# Patient Record
Sex: Female | Born: 1960 | Race: White | Hispanic: No | Marital: Married | Smoking: Current every day smoker
Health system: Southern US, Community
[De-identification: ages and names within clinical notes are randomized; demographics above are authoritative.]

## PROBLEM LIST (undated history)

## (undated) DIAGNOSIS — I10 Essential (primary) hypertension: Secondary | ICD-10-CM

## (undated) DIAGNOSIS — E669 Obesity, unspecified: Secondary | ICD-10-CM

## (undated) DIAGNOSIS — C801 Malignant (primary) neoplasm, unspecified: Secondary | ICD-10-CM

## (undated) DIAGNOSIS — Z720 Tobacco use: Secondary | ICD-10-CM

## (undated) DIAGNOSIS — R05 Cough: Secondary | ICD-10-CM

## (undated) DIAGNOSIS — K5090 Crohn's disease, unspecified, without complications: Secondary | ICD-10-CM

## (undated) DIAGNOSIS — K219 Gastro-esophageal reflux disease without esophagitis: Secondary | ICD-10-CM

## (undated) DIAGNOSIS — J40 Bronchitis, not specified as acute or chronic: Secondary | ICD-10-CM

## (undated) DIAGNOSIS — M1990 Unspecified osteoarthritis, unspecified site: Secondary | ICD-10-CM

## (undated) DIAGNOSIS — A0472 Enterocolitis due to Clostridium difficile, not specified as recurrent: Secondary | ICD-10-CM

## (undated) DIAGNOSIS — K5792 Diverticulitis of intestine, part unspecified, without perforation or abscess without bleeding: Secondary | ICD-10-CM

## (undated) DIAGNOSIS — R740 Nonspecific elevation of levels of transaminase and lactic acid dehydrogenase [LDH]: Secondary | ICD-10-CM

## (undated) DIAGNOSIS — J189 Pneumonia, unspecified organism: Secondary | ICD-10-CM

## (undated) DIAGNOSIS — R7401 Elevation of levels of liver transaminase levels: Secondary | ICD-10-CM

## (undated) HISTORY — DX: Essential (primary) hypertension: I10

## (undated) HISTORY — DX: Crohn's disease, unspecified, without complications: K50.90

## (undated) HISTORY — DX: Nonspecific elevation of levels of transaminase and lactic acid dehydrogenase (ldh): R74.0

## (undated) HISTORY — DX: Cough: R05

## (undated) HISTORY — PX: COLON SURGERY: SHX602

## (undated) HISTORY — PX: APPENDECTOMY: SHX54

## (undated) HISTORY — DX: Elevation of levels of liver transaminase levels: R74.01

## (undated) HISTORY — PX: COLONOSCOPY: SHX174

## (undated) HISTORY — DX: Enterocolitis due to Clostridium difficile, not specified as recurrent: A04.72

---

## 1978-12-13 HISTORY — PX: ABDOMINAL HYSTERECTOMY: SHX81

## 2002-07-24 ENCOUNTER — Ambulatory Visit (HOSPITAL_BASED_OUTPATIENT_CLINIC_OR_DEPARTMENT_OTHER): Admission: RE | Admit: 2002-07-24 | Discharge: 2002-07-24 | Payer: Self-pay | Admitting: Orthopedic Surgery

## 2003-12-14 HISTORY — PX: OTHER SURGICAL HISTORY: SHX169

## 2004-01-15 ENCOUNTER — Inpatient Hospital Stay (HOSPITAL_COMMUNITY): Admission: EM | Admit: 2004-01-15 | Discharge: 2004-01-17 | Payer: Self-pay | Admitting: Emergency Medicine

## 2004-01-23 ENCOUNTER — Encounter (HOSPITAL_COMMUNITY): Admission: RE | Admit: 2004-01-23 | Discharge: 2004-02-22 | Payer: Self-pay | Admitting: Internal Medicine

## 2004-03-06 ENCOUNTER — Encounter (HOSPITAL_COMMUNITY): Admission: RE | Admit: 2004-03-06 | Discharge: 2004-04-05 | Payer: Self-pay | Admitting: Oncology

## 2004-03-06 ENCOUNTER — Encounter: Admission: RE | Admit: 2004-03-06 | Discharge: 2004-03-06 | Payer: Self-pay | Admitting: Oncology

## 2004-05-28 ENCOUNTER — Observation Stay (HOSPITAL_COMMUNITY): Admission: EM | Admit: 2004-05-28 | Discharge: 2004-05-29 | Payer: Self-pay | Admitting: Emergency Medicine

## 2004-07-30 ENCOUNTER — Encounter (HOSPITAL_COMMUNITY): Admission: RE | Admit: 2004-07-30 | Discharge: 2004-08-29 | Payer: Self-pay | Admitting: Oncology

## 2004-09-18 ENCOUNTER — Ambulatory Visit (HOSPITAL_COMMUNITY): Admission: RE | Admit: 2004-09-18 | Discharge: 2004-09-18 | Payer: Self-pay | Admitting: Internal Medicine

## 2004-10-15 ENCOUNTER — Ambulatory Visit (HOSPITAL_COMMUNITY): Admission: RE | Admit: 2004-10-15 | Discharge: 2004-10-15 | Payer: Self-pay | Admitting: Internal Medicine

## 2004-10-22 ENCOUNTER — Ambulatory Visit (HOSPITAL_COMMUNITY): Admission: RE | Admit: 2004-10-22 | Discharge: 2004-10-22 | Payer: Self-pay | Admitting: Internal Medicine

## 2005-01-13 ENCOUNTER — Ambulatory Visit: Payer: Self-pay | Admitting: Internal Medicine

## 2005-04-27 ENCOUNTER — Ambulatory Visit: Payer: Self-pay | Admitting: Internal Medicine

## 2005-12-20 ENCOUNTER — Ambulatory Visit: Payer: Self-pay | Admitting: Internal Medicine

## 2006-08-10 ENCOUNTER — Ambulatory Visit: Payer: Self-pay | Admitting: Internal Medicine

## 2007-04-28 ENCOUNTER — Ambulatory Visit: Payer: Self-pay | Admitting: Internal Medicine

## 2010-02-25 ENCOUNTER — Encounter (HOSPITAL_COMMUNITY): Admission: RE | Admit: 2010-02-25 | Discharge: 2010-03-27 | Payer: Self-pay | Admitting: Orthopaedic Surgery

## 2010-06-02 ENCOUNTER — Ambulatory Visit (HOSPITAL_COMMUNITY): Admission: RE | Admit: 2010-06-02 | Discharge: 2010-06-02 | Payer: Self-pay | Admitting: Orthopaedic Surgery

## 2010-10-26 ENCOUNTER — Ambulatory Visit: Payer: Self-pay | Admitting: Internal Medicine

## 2010-11-24 ENCOUNTER — Ambulatory Visit: Payer: Self-pay | Admitting: Gynecology

## 2010-11-24 ENCOUNTER — Other Ambulatory Visit
Admission: RE | Admit: 2010-11-24 | Discharge: 2010-11-24 | Payer: Self-pay | Source: Home / Self Care | Admitting: Gynecology

## 2011-01-02 ENCOUNTER — Encounter (INDEPENDENT_AMBULATORY_CARE_PROVIDER_SITE_OTHER): Payer: Self-pay | Admitting: Internal Medicine

## 2011-02-19 ENCOUNTER — Other Ambulatory Visit (INDEPENDENT_AMBULATORY_CARE_PROVIDER_SITE_OTHER): Payer: Self-pay | Admitting: Internal Medicine

## 2011-02-19 DIAGNOSIS — K5090 Crohn's disease, unspecified, without complications: Secondary | ICD-10-CM

## 2011-02-25 ENCOUNTER — Ambulatory Visit (HOSPITAL_COMMUNITY): Payer: PRIVATE HEALTH INSURANCE

## 2011-03-02 ENCOUNTER — Ambulatory Visit (HOSPITAL_COMMUNITY)
Admission: RE | Admit: 2011-03-02 | Discharge: 2011-03-02 | Disposition: A | Discharge status: Home / Self Care | Payer: PRIVATE HEALTH INSURANCE | Source: Ambulatory Visit | Attending: Internal Medicine | Admitting: Internal Medicine

## 2011-03-02 DIAGNOSIS — R748 Abnormal levels of other serum enzymes: Secondary | ICD-10-CM | POA: Insufficient documentation

## 2011-03-02 DIAGNOSIS — K5090 Crohn's disease, unspecified, without complications: Secondary | ICD-10-CM | POA: Insufficient documentation

## 2011-03-15 ENCOUNTER — Ambulatory Visit (INDEPENDENT_AMBULATORY_CARE_PROVIDER_SITE_OTHER): Payer: PRIVATE HEALTH INSURANCE | Admitting: Internal Medicine

## 2011-03-15 DIAGNOSIS — K5090 Crohn's disease, unspecified, without complications: Secondary | ICD-10-CM

## 2011-03-29 NOTE — Consult Note (Signed)
Elizabeth [name]                  ACCOUNT NO.:  [id number]  MEDICAL RECORD NO.:  [id number]           PATIENT TYPE: AMB.  LOCATION: [place].                                 [facility]  PHYSICIAN:  Hildred Laser, M.D.    DATE OF BIRTH:  1961/12/07  DATE :  03/15/2011                                 OFFICE VISIT.   PRESENTING COMPLAINT:  Elevated transaminases.  The patient has ileal Crohn disease and chronic GERD.  SUBJECTIVE:  Elizabeth [name] is a 50 year old Caucasian female patient of Dr. Seward Speck who is here for scheduled visit.  She has had mildly elevated transaminases for the last 2 years felt to be due to fatty liver.  However, her SGOT and SGPT had increased since May 2010, when these were 35 and 103 respectively and last month 67 and 179.  He, therefore, was treated with further studies including an ultrasound and ferritin and water markers.  The patient states in the meantime, she was found to be hypertensive. She has seen Dr. Elisabeth Cara.  She was begun on therapy.  She also has changed her eating habits and she has lost about 16 pounds in the last 6 weeks and she feels lot better.  A 2 weeks ago, she had norovirus, but she had a full recovery.  She is not having any problems with abdominal pain, diarrhea, melena or rectal bleeding or constipation.  Her appetite is very good.  She always has to control herself.  She states she worries about everything including health of her mother.  CURRENT MEDICATIONS: 1. Pentasa 1 g 3 times a day. 2. Prilosec OTC 40 mg q.a.m. 3. 6-MP 100 mg daily. 4. Black cohosh one daily. 5. Lisinopril HCT question dose daily. 6. Lisinopril 20 mg p.o. nightly.  OBJECTIVE:  VITAL SIGNS:  Weight 211 pounds, she is 67 inches tall, pulse 74 per minute, blood pressure 130/80 and temp is 98.1. EYES:  Conjunctivae are pink.  Sclerae are nonicteric. MOUTH:  Oropharyngeal mucosa is normal. NECK:  No neck masses are noted. ABDOMEN:  Full.  Bowel  sounds are normal.  On palpation, soft abdomen without tenderness or masses.  Liver edge is palpable below.  Her ostium is soft and nontender.  Spleen is not palpable.  LABORATORY DATA:  From February 19, 2011, serum ferritin 576, hepatitis B surface antigen negative, hepatitis C virus antibody is negative.  Her bilirubin was 1, AP 55, SGOT 67, SGPT 179, albumin 4.4.  Her WBC 3.7, H and H 15.7 and 43.3, platelet count was 337 K.  Ultrasound March 02, 2011, shows echogenic liver consistent with fatty infiltration.  No nodularity noted.  Gallbladder without any abnormality.  Bile duct is 4-mm and spleen is 6.9-cm.  ASSESSMENT: 1. Small bowel Crohn disease.  She appears to be in remission.  She     has been on 6-MP since February 2010.  She has had mildly elevated     transaminases since May, however, they are trending upwards.  In     the past, I felt that due to fatty liver.  This  would go along with     a weight gain.  However, this abnormality could also be due to 6-     MP.  Since I am not convinced, I am reluctant to discontinue 6-MP     at the present time.  If her transaminases are elevated due to     weight gain and fatty liver, they should start to improve now that     she is losing weight. 2. Chronic gastroesophageal reflux disease.  She is doing well with     therapy.  PLAN:  The patient will continue current diet and  physical activity.  We will not make any changes to medication today.  She will have CBC and LFTs in 4 weeks' along with serum ferritin.     Hildred Laser, M.D.     NR/MEDQ  D:  03/16/2011  T:  03/16/2011  Job:  [id number]  cc:   Wiliam Ke Fax: [phone]  Electronically Signed by Hildred Laser M.D. on 03/28/2011 11:14:04 PM

## 2011-04-27 NOTE — Assessment & Plan Note (Signed)
NAMEMarland [name]  [name], [name]                   CHART#:  [id number]   DATE:  04/28/2007                       DOB:  1961/02/26   PRESENTING COMPLAINT:  Followup of her Crohn's disease and GERD.   SUBJECTIVE:  She is a 50 year old Caucasian female who is here for  scheduled visit.  She was last seen in August 2007.  She states she has  been under a lot of stress.  She states when she gets upset or nervous  she gets nausea and vomiting.  She will be graduating in July and is  very excited, as she will be able to support herself.  She is also  concerned about her weight gain.  She has gained 8 pounds since her last  visit.  She said she is just studying too much and does not have any  time for exercise.  She states she rarely has heartburn except when she  misses her Aciphex and she has symptoms the same evening.  She has 6 to  7 bowel movements every morning within a period of an hour and nothing  thereafter.  Stools are formed to semi-formed and occasionally loose.  She denies melena, rectal bleeding or abdominal pain.  She admits that  she has a very good appetite.   MEDICATIONS:  She is on Pentasa 1 g, Aciphex 20 mg q.a.m., M.V.I. q.d.  and B12 one pill daily.   OBJECTIVE:  VITAL SIGNS:  Weight 202 pounds, she is 67 inches tall,  pulse 68 per minute, blood pressure 116/76, temperature 97.9.  HEENT:  Conjunctivae and sclera not icteric.  Oropharynx mucosa is  normal.  NECK:  No masses are noted.  ABDOMEN:  Full.  Bowel sounds are normal.  Soft and nontender abdomen.  No organomegaly or masses.  EXTREMITIES:  She does not have clubbing or peripheral edema.   ASSESSMENT:  1. Small bowel Crohn's disease.  She is status post right      hemicolectomy in 2005 at [facility] for refractory disease.      She is now on maintenance with Pentasa, hoping to prevent a      relapse.  So far, so good.  2. Chronic gastroesophageal reflux disease.  She is doing well with      therapy.  3. Sporadic  nausea and vomiting which clearly appears to be stress      induced.   PLAN:  The patient advised to take calcium with vitamin D one or two  pills daily.  She is continuing Pentasa and Aciphex as before.  She is  getting these medicines through [facility].  Prescription  given for Phenergan 25 mg b.i.d. 20 without a refill.   She will return for OV in six months and she will have CBC and chemistry  panel following that visit.       Hildred Laser, M.D.  Electronically Signed     NR/MEDQ  D:  04/28/2007  T:  04/28/2007  Job:  [id number]   cc:   Stoney Bang

## 2011-04-30 NOTE — Discharge Summary (Signed)
NAME:  Elizabeth [name], MOTHERSHEAD                            ACCOUNT NO.:  [id number]   MEDICAL RECORD NO.:  [id number]                   PATIENT TYPE:  INP   LOCATION:  O996                                 FACILITY:  [facility]   PHYSICIAN:  Vanetta Mulders. Dechurch, M.D.           DATE OF BIRTH:  1961/07/16   DATE OF ADMISSION:  05/28/2004  DATE OF DISCHARGE:  05/29/2004                                 DISCHARGE SUMMARY   DIAGNOSES:  1. Exacerbation of Crohn's disease.  2. Status post hysterectomy.  3. Status post traumatic amputation of left ring finger.   DISPOSITION:  Patient discharged to home, followed by Dr. Gala Romney.   MEDICATIONS:  1. Prednisone 30 mg daily for one week, taper as per Dr. Gala Romney.  2. Remicade 10 mg.  3. Pentasa 1 g daily.  4. Aciphex 20 mg daily.  5. New Live.   HOSPITAL COURSE:  The patient is a 50 year old Caucasian female with a  history of Crohn's disease primarily effecting the ileum and cecum.  She  apparently was no longer taking prednisone.  She presented to the emergency  room with a two day history of worsening right lower quadrant pain.  She  states she had nausea, vomiting and diarrhea.  She received some IV  steroids.  She was seen in consultation by Dr. Gala Romney, her primary care  physician who resumed her prednisone.  She was taking p.o.'s.  He felt it  was reasonable for her to be discharged.  She was discharged to home after  evaluation per Dr. Gala Romney.  She was not seen by the Hospitalist Service  except for admission.  She was discharged home in stable condition and  follow up was arranged by gastroenterology.     ___________________________________________                                         Vanetta Mulders Hillery Jacks, M.D.   FED/MEDQ  D:  06/18/2004  T:  06/19/2004  Job:  [id number]

## 2011-04-30 NOTE — H&P (Signed)
NAME:  Elizabeth [name], Elizabeth [name]                            ACCOUNT NO.:  [id number]   MEDICAL RECORD NO.:  [id number]                   PATIENT TYPE:  INP   LOCATION:  L976                                 FACILITY:  [facility]   PHYSICIAN:  Hildred Laser, M.D.                 DATE OF BIRTH:  1961-09-11   DATE OF ADMISSION:  01/15/2004  DATE OF DISCHARGE:                                HISTORY & PHYSICAL   PRESENTING COMPLAINT:  Severe lower abdominal pain, nausea, and vomiting  since yesterday.  The patient has known ileal and cecal Crohn's disease.   HISTORY OF PRESENT ILLNESS:  Elizabeth [name] is a 50 year old Caucasian female patient  of Dr. Sherrie Sport with known Crohn's disease involving the TI and cecum, who  was seen in the office for her first visit on December 30, 2003.  She was  still symptomatic.  She was maintained on Pentasa and placed back on Cipro.  She was given a prednisone taper schedule.  She had been on 30 mg q.a.m.  The patient states that she was doing great.  She had dropped the dose from  30 mg to 25 mg last week and yesterday she dropped the dose of 20 mg as  planned.  She was fine until yesterday morning when she thought she was  constipated.  She was having some fullness and lower abdominal discomfort  and she decided to take four tablets of __________.  According to her  husband, she has also taken Alka-Seltzer, but not on a regular basis.  Yesterday afternoon she began to have severe crampy pain across the lower  abdomen which doubled her over and she had nausea and some emesis.  This  morning, her abdominal pain continued and seemed more intense and she was  still having nausea and vomiting.  The patient called our office and she was  advised to go to the emergency room.  The patient has not experienced any  fever.  She has had very good appetite.  She states she has gained a few  pounds since she was seen in our office.  She has not yet been weighed in  the emergency room.  She had a  bowel movement yesterday.  She has not passed  any flatus today.  She denies hematemesis, melena, or rectal bleeding.  She  has a history of microscopic rectal bleeding or dysuria/hematuria.   She was seen in the emergency room by Gypsy Balsam. Olin Hauser, M.D.  She was noted  to be quite tender in her lower abdomen.  Her white cell count was 23,700.  Acute abdominal series revealed some gas in the distal small bowel  consistent with ileus or early small bowel obstruction.  Dr. Olin Hauser wanted  to do a CT scan, but because of allergy to iodine dye, she decided to hold  off.   MEDICATIONS:  The patient's usual medications at home are:  1. Pentasa 1 g q.i.d.  2. Prednisone 20 mg q.a.m.  3. Cipro 500 mg b.i.d.  4. NuLev SL t.i.d. p.r.n.  5. Percocet one b.i.d. p.r.n. which she does not take on a regular basis.   PAST MEDICAL HISTORY:  1. She had a hysterectomy in 1981.  2. She had to have the distal aspect of the left ring finger amputated     secondary to an injury that she had previously.  3. Ileocecal Crohn's disease was diagnosed last year based on abnormal CT.     She had a colonoscopy by Dr. Lindalou Hose, which I believe was in November     of 2004, which revealed disease to her cecum.  She also had small bowel     study which showed changes of ileitis, but not high-grade stricture.   ALLERGIES:  1. PENICILLIN which causes skin rash and itching.  2. She had hives with IODINE CONTRAST.   SOCIAL HISTORY:  She is married.  She has one daughter.  She presently is  unemployed, but she was working at [employer].  She is hoping to return to  work as soon as she is able to.  She has been smoking for the past 30 years,  presently down to 10 per day, but she does not drink alcohol.   FAMILY HISTORY:  Her father died at lung carcinoma at age 71.  Her mother is  in good health.  She has one brother who is also in good health.  The family  history is negative for Crohn's disease or ulcerative  colitis.   PHYSICAL EXAMINATION:  GENERAL APPEARANCE:  A well-developed, mildly obese,  Caucasian female who does not appear to be feeling well.  WEIGHT:  The admission weight is pending.  HEIGHT:  She is 5 feet 7 inches tall.  VITAL SIGNS:  Pulse 70 per minute, blood pressure 96/52, respiratory rate  24, and temperature 97.7 degrees.  HEENT:  The conjunctivae are pink.  The sclerae are nonicteric.  The  oropharyngeal mucosa is somewhat dry.  NECK:  Without masses or thyromegaly.  HEART:  Within normal limits.  LUNGS:  Within normal limits.  ABDOMEN:  Full.  Bowel sounds are normal.  On palpation it is soft with more  or less generalized tenderness, but this is more so at the hypogastric area  with some guarding, but there is no rebound.  No organomegaly or masses  noted.  RECTAL:  Deferred.  EXTREMITIES:  She does not have clubbing or peripheral edema.   LABORATORIES:  WBC 23.7, hemoglobin 17.4, hematocrit 51.4, MCV 90.7,  platelet count 361,000.  She has 91% neutrophils, 7% lymphocytes, 2%  monocytes, and 20% bands.  Sodium 137, potassium 3.6, chloride 97, CO2 30,  glucose 98, BUN 11, creatinine 0.7.  The bilirubin is 0.9, AP 67, AST 14,  ALT 21, and albumin 3.2.  The serum amylase is 59 and the lipase is 23.  The  urinalysis reveals a specific gravity of 1.015, a pH of 8, negative ketones,  protein 30 mg/dl, nitrates negative, moderate leukocyte esterase, and 3-6  wbc's.  Please note that the patient has already been given antibiotics in  the emergency room and we will find out whether or not culture was done.   ASSESSMENT:  Elizabeth [name] is a 50 year old Caucasian female who was diagnosed with  ileal and cecal Crohn's disease about three months ago, who was doing well  until yesterday when she developed lower abdominal pain, nausea, and  vomiting.  It is very likely that these symptoms occurred due to the use of laxative.  She took tablets of __________.  She also has taken some D[place]in.  She does not have pneumoperitoneum and abdominal exam does not  reveal peritoneal signs, but she certainly could have microperforation or  this just could be a relapse of her Crohn's disease or she could also have  infectious enterocolitis.   PLAN:  She will be admitted to the hospital.  Will treat her with Cipro and  Flagyl IV and also give her Solu-Medrol 40 mg IV q.12h.  She will be given  morphine sulfate for pain control.  Will make her NPO, except for some ice  chips.  Will also give her Protonix 40 mg IV q.24h.  Will plan  abdominopelvic CT with oral and IV contrast in a.m.  As discussed with  Deniece Portela, M.D., she will be given Solu-Medrol 100 mg IV and  Benadryl 50 mg IM prior to that study.   If CT is negative for fluid collection or an abscess and unless she improves  dramatically within the next 48 hours, would consider Remicade infusion.  Please note that she had a PPD done in August of last year which was  negative, which means that she would not have to have this again.     ___________________________________________                                         Hildred Laser, M.D.   NR/MEDQ  D:  01/15/2004  T:  01/15/2004  Job:  [id number]   cc:   Velvet Bathe Hasanaj  [place]  [place] [address]  Fax: [phone]

## 2011-04-30 NOTE — Op Note (Signed)
   NAME:  MEAGHEN, VECCHIARELLI                            ACCOUNT NO.:  [id number]   MEDICAL RECORD NO.:  [id number]                    PATIENT TYPE:   LOCATION:                                       FACILITY:   PHYSICIAN:  Lekha Dancer DICTATOR                    DATE OF BIRTH:   DATE OF PROCEDURE:  07/24/2002  DATE OF DISCHARGE:                                 OPERATIVE REPORT   PREOPERATIVE DIAGNOSES:  Painful amputation stump, left ring finger.   POSTOPERATIVE DIAGNOSES:  Painful amputation stump, left ring finger.   OPERATION PERFORMED:  Revision of amputation stump, left ring finger.   SURGEON:  Geroge Baseman. Alphonzo Cruise, M.D.   ANESTHESIA:  General.   DESCRIPTION OF PROCEDURE:  The patient was placed on the operating table in  the supine position with a pneumatic tourniquet about the left upper arm.  The left upper extremity was prepped with DuraPrep and draped out in the  usual manner.  The left upper extremity was then wrapped out with an Esmarch  and tourniquet was elevated.  A medial and lateral incision was made over  the distal end of the left ring finger through the previous amputation area.  There was a very large painful callus and a bony spicule underneath the  skin.  The incision was carried circumferentially around the tip of the  finger and a dorsal and ventral flap was then developed.  The large bony  spine was then dissected free from the underlying soft tissue.  The finger  was filleted open and dissection was done subperiosteally.  A bone-cutting  forceps was then used and about 5 to 8 mm of distal stump was then  amputated.  A dorsal flap was then cut back slightly to allow for a nice  plastic closure and to get rid of the painful large callus.  Skin edges  could then be approximated very nicely with absolutely no tension on the  sutures.  This gave a nice full padded stump with good subcutaneous tissues  closing the distal end of the bone.  4-0 nylon sutures were then used  to  close the skin.  Cosmetically, this gave a very nice result.  Sterile  dressing was applied.  The tourniquet was dropped.  The patient was then  transferred to the recovery room in excellent condition.  Technically this  went extremely well.   DRAINS:  None.   COMPLICATIONS:  None.                                                 Carston Riedl DICTATOR    DD/MEDQ  D:  07/24/2002  T:  07/26/2002  Job:  [id number]

## 2011-04-30 NOTE — H&P (Signed)
NAME:  Elizabeth [name], Elizabeth [name]                            ACCOUNT NO.:  [id number]   MEDICAL RECORD NO.:  [id number]                   PATIENT TYPE:  INP   LOCATION:  N470                                 FACILITY:  [facility]   PHYSICIAN:  Benito Mccreedy, M.D.             DATE OF BIRTH:  January 14, 1961   DATE OF ADMISSION:  05/28/2004  DATE OF DISCHARGE:                                HISTORY & PHYSICAL   CHIEF COMPLAINT:  Abdominal pain.   HISTORY OF PRESENT ILLNESS:  The patient is a 50 year old old lady with a  history of Crohn's disease who presents with a two-day history of worsening  right lower-quadrant area pain, she stated, with nausea, vomiting and  diarrhea.  The pain was said to be moderate intensity without any known  alleviating or aggravating factor.  She does not have any history of  constipation, black or bloody stools.  She denies any history of dysuria or  frequency of micturition.  She denies any fever or chills.  She does not  have any shortness of breath, chest pain or copious sputum production.   The patient has been chronic steroids which was discontinued almost two  weeks ago after being titrated off this medication by Dr. Hildred Laser.  On  account of intensity of the pain, the patient came to the ED where upon  after evaluation and discussion with Dr. Gala Romney of GI, it was decided that it  would maybe be best for the patient to be admitted to the hospital for pain  control and reevaluation by Dr. Gala Romney this morning.  The hospitalist service  was asked to consider admission and evaluation, pending GI evaluation in the  morning.   PAST MEDICAL HISTORY:  Significant for a history of Crohn's disease as  mentioned above.  Her disease affects her ileum and cecum.  She has a  history of amputation of her left ring finger.  She is status post  hysterectomy in October 2001.   MEDICATIONS:  1. Pentasa 1 gram p.o. daily.  2. NuLev.  3. Aciphex.  4. Remicade.   ALLERGIES:  1.  PENICILLIN which causes extensive itch.  2. IODINE CONTRAST which gives her hives.   FAMILY HISTORY:  As stated above, for history of lung cancer in her dad at  age 88.  There is no history of Crohn's disease or ulcerative colitis.   SOCIAL HISTORY:  The patient is married.  She has one older daughter.  She  currently works at [employer], and enrolled in [employer].   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  She smokes 1 to 2-1/2 packs of cigarettes daily.  She has been smoking for  more than 30 years.  She does not drink alcohol.   REVIEW OF SYSTEMS:  Significant positive and negatives, see HPI, but  otherwise, review of systems is  unremarkable.   PHYSICAL EXAMINATION:  VITAL SIGNS:  Blood pressure 103/64, pulse 63,  respiratory rate 18, temperature 98.2 degrees Fahrenheit.  GENERAL:  She was in mild-to-moderate painful distress, but she was not  __________ looking.  HEENT:  Normocephalic, atraumatic.  Pupils were equal, round and reactive to  light.  Extraocular movements were intact.  She did not have any  conjunctival pallor or icterus.  NECK:  Supple without any thyromegaly.  LUNGS:  Clear to auscultation bilaterally.  CARDIAC EXAM:  Notable for regular rate and rhythm, no gallops or murmur.  ABDOMEN:  Full.  She had some mild to moderate tenderness involving her  lower abdomen, more on the right low-quadrant area without any rebound.  She  had some mild guarding.  There was no organomegaly.  EXTREMITIES:  Negative for any edema or clubbing.   LABORATORY DATA:  CT of the abdomen has been ordered by ED physician.  The  results are pending.  WBC count was 14.4, hemoglobin 14.8, hematocrit 30.0.  MCV 102.1, platelet count 388,000.  Sodium 135, potassium 4.2, chloride 104,  CO2 27, glucose 105, BUN 7, creatinine 0.6, calcium 8.8.  UA was negative  for any urinary tract infection.   ASSESSMENT:  1. A pleasant 50 year old  Caucasian lady with a history of Crohn's disease     who was recently tapered off her prednisone who presents with abdominal     pain, nausea, vomiting and diarrhea without any hematochezia.  She has     tenderness with some mild guarding, without any rebound on abdominal     exam.  2. Lab work notable for leukocytosis.  3. Impression is acute flare-up of Crohn's disease.   PLAN OF CARE:  1. The patient was admitted to the hospitalist service in a regular bed.     Supportive measures including IV fluids, analgesics, and electrolyte     monitoring will be instituted.  She will receive IV steroids.  She does     not seem to be having problems with adrenal insufficiency from withdrawal     of her steroids.  Her electrolytes are okay.  Her blood pressure is     within normal limits.  2. The patient is to be seen by GI service, Dr. Bridgette Habermann in the     morning for further management regarding the use of Remicade. I discussed     with ED physician.     ___________________________________________                                         Benito Mccreedy, M.D.   GO/MEDQ  D:  05/29/2004  T:  05/29/2004  Job:  [id number]   cc:   R. Garfield Cornea, M.D.  [address]  [place]  [place] [address]  Fax: [phone]   Megan Salon, Dr.  Hospitalist Service

## 2011-04-30 NOTE — Discharge Summary (Signed)
[name], [name]                            ACCOUNT NO.:  [id number]   MEDICAL RECORD NO.:  [id number]                   PATIENT TYPE:  INP   LOCATION:  R485                                 FACILITY:  [facility]   PHYSICIAN:  Hildred Laser, M.D.                 DATE OF BIRTH:  1961/06/15   DATE OF ADMISSION:  01/15/2004  DATE OF DISCHARGE:  01/17/2004                                 DISCHARGE SUMMARY   DISCHARGE DIAGNOSES:  1. Abdominal pain with nausea and vomiting.  2. Small bowel Crohn's disease.  3. Chronic insomnia.   CONDITION AT TIME OF DISCHARGE:  Improved.   DISCHARGE MEDICATIONS:  1. Prednisone 20 mg p.o. q.a.m.  2. Cipro 500 mg p.o. b.i.d.  3. Pentasa 1 gm q.i.d.  4. Percocet 1 t.i.d. p.r.n.  5. NuLev 1 t.i.d. p.r.n.  6. Doxepin 25-50 mg p.o. q.h.s.   HOSPITAL COURSE:  Elizabeth [name] is a 50 year old Caucasian female who was diagnosed  with Crohn's disease less than 3 months ago.  She was doing well with her  mesalamine and prednisone as well as antibiotic.  One day prior to admission  she thought she was constipated; and took upon herself to treat it with 4  tablets of laxative; and she also took Alka-Seltzer.  She developed  excruciating pain across her lower abdomen which then became generalized  associated with nausea and vomiting.  She, therefore, presented to the  emergency room.  She was in quite a bit of pain.  She had a white cell count  of 23,700 with 91% segs.  Her abdominal exam revealed active bowel sounds  and generalized tenderness which was more pronounced in the hypogastric and  right lower quadrant area though she did not have any peritoneal signs.  Her  acute abdominal series was negative for pneumoperitoneum.  With her history,  I was concerned that she could have a microperforation.   The patient was admitted.  She was begun on IV antibiotics and IV steroids.  She was premedicated with steroids and Benadryl and underwent abdominal  pelvic CT the following  morning. While it showed active Crohn's disease  involving the TI, there no features to suggest perforation.  The patient was  kept NPO and she improved dramatically within the first 24 hours.  She was  begun on a diet which she tolerated well.  She also had urinalysis which  showed positive leukocyte esterase and 3-6 wbc's.  She already had received  antibiotic; and, therefore, culture was not done. Blood cultures were  obtained; however, prior to antibiotic therapy and these were negative.  Her  amylase and lipase were also normal.  On hospital day #2 her WBC was down to  9.8; however, on the day of discharge it was back up to 27.3, but she was  doing very well and she had minimal tenderness.  Although quite dramatic, I  felt that this was secondary to steroids.   Since Elizabeth [name] has significant changes of inflammation involving the terminal  ileum and she has been hospitalized here and also at [facility], I feel that  she would be a candidate for Remicade infusion. I feel that it would allow  [place] to get her into remission quicker and also get her off the prednisone  which is already causing a voracious appetite and weight gain.  The patient  is also complaining of insomnia which apparently is a chronic problem and  she was discharged on Doxepin.   She will be returning to the specialty clinic, next week, to undergo  Remicade and she will be seen in the office following that therapy.     ___________________________________________                                         Hildred Laser, M.D.   NR/MEDQ  D:  01/23/2004  T:  01/23/2004  Job:  [id number]   cc:   Velvet Bathe Hasanaj  [place]  [place] [address]  Fax: [phone]

## 2011-04-30 NOTE — Consult Note (Signed)
NAME:  Elizabeth [name], Elizabeth [name]                            ACCOUNT NO.:  [id number]   MEDICAL RECORD NO.:  [id number]                   PATIENT TYPE:  INP   LOCATION:  M629                                 FACILITY:  [facility]   PHYSICIAN:  R. Garfield Cornea, M.D.              DATE OF BIRTH:  [birth year]   DATE OF CONSULTATION:  DATE OF DISCHARGE:  05/29/2004                                   CONSULTATION   CHIEF COMPLAINT:  Exacerbation of Crohn's disease.   HISTORY:  The patient was admitted through the ER by Dr. Megan Salon for  abdominal pain x4 days.  The patient has been vomiting uncontrollably.  The  patient states that per instructions from Dr. Hildred Laser, she had  titrated her prednisone to 1 mg daily.  Approximately six days ago, she  stopped prednisone and within four days began to develop the symptoms that  brought her to the emergency room.  Dr. Laural Golden had written a prescription  for 6-MP 50 mg to be taken two daily during the transition period.  The  patient states that this did not help, but she has been taking medication as  instructed.  The patient has known ileal and cecal Crohn's disease.  The  patient's maintenance medications are Pentasa 1 gram q.i.d.  The patient  denies hematemesis, melena or rectal bleeding.   CURRENT MEDICATIONS:  1. Pentasa 1 gram q.i.d.  2. Prednisone discontinued.  3. 6-MP, 50 mg two tablets daily.  4. The patient was placed on Protonix 40 mg IV, Solu-Medrol 1.25 mg IV     q.8h., Dilaudid 2 mg IV q.6h p.r.n. for pain, and Phenergan 12.5 mg IV     q.4h. p.r.n. for pain.   PAST MEDICAL HISTORY:  The patient had a hysterectomy in 1981.  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   ALLERGIES:  PENICILLIN which causes skin rash, and itching.  The patient  gets hives with IV CONTRAST.   SOCIAL HISTORY:  The patient  is married.  She has one living daughter.  The  patient smokes one pack of cigarettes per day for the past 30 years.  The  patient denies use of alcohol or drugs.   FAMILY HISTORY:  Her father died of lung cancer at the age of 22.  Her  mother is in good health.  She has one brother in good health.  There is no  family history of Crohn's disease or ulcerative colitis.   PHYSICAL EXAMINATION:  GENERAL:  The patient is a well-developed, well-  nourished female, walking the halls.  Her husband is in the hospital room  with her.  VITAL SIGNS:  Temperature 97.9, pulse 58, respirations 18, blood pressure  104/61, height 67 inches, weight 192 pounds.  LABORATORY DATA:  WBC 11.5, hemoglobin 13.7, hematocrit 39.2.  Platelets  381,000.  Sodium 135, potassium 4.1, chloride 102.  CO2 26, glucose 187, BUN  6, creatinine 0.7, calcium 8.5.  ESR is 50.   ASSESSMENT:  1. The patient is a 50 year old Caucasian female diagnosed with ileal and     cecal Crohn's disease a year ago, who was responding to treatment until     she completely discontinued prednisone and had a flare.  The patient was     admitted to the emergency room for severe abdominal pain and vomiting.  2. The patient reports today that she is feeling well enough to want to go     home.  Dr. Gala Romney has ordered administration of Remicade at 10 a.m. today,     and will see the patient this p.m. to discuss her treatment plan.   RECOMMENDATIONS:  Administration of Remicade as ordered by Dr. Gala Romney.  Continue on medication.  More recommendations to follow.     ________________________________________  ___________________________________________  Dellis Anes, PA                           Bridgette Habermann, M.D.   GC/MEDQ  D:  05/29/2004  T:  05/30/2004  Job:  [id number]

## 2011-06-14 ENCOUNTER — Ambulatory Visit (INDEPENDENT_AMBULATORY_CARE_PROVIDER_SITE_OTHER): Payer: PRIVATE HEALTH INSURANCE | Admitting: Internal Medicine

## 2011-06-14 DIAGNOSIS — E7889 Other lipoprotein metabolism disorders: Secondary | ICD-10-CM

## 2011-06-14 DIAGNOSIS — K7689 Other specified diseases of liver: Secondary | ICD-10-CM

## 2011-06-14 DIAGNOSIS — K5090 Crohn's disease, unspecified, without complications: Secondary | ICD-10-CM

## 2011-06-23 ENCOUNTER — Other Ambulatory Visit (INDEPENDENT_AMBULATORY_CARE_PROVIDER_SITE_OTHER): Payer: Self-pay | Admitting: Internal Medicine

## 2011-06-23 DIAGNOSIS — K5090 Crohn's disease, unspecified, without complications: Secondary | ICD-10-CM

## 2011-07-01 ENCOUNTER — Ambulatory Visit (HOSPITAL_COMMUNITY): Payer: PRIVATE HEALTH INSURANCE

## 2011-07-08 ENCOUNTER — Ambulatory Visit (HOSPITAL_COMMUNITY)
Admission: RE | Admit: 2011-07-08 | Discharge: 2011-07-08 | Disposition: A | Discharge status: Home / Self Care | Payer: PRIVATE HEALTH INSURANCE | Source: Ambulatory Visit | Attending: Internal Medicine | Admitting: Internal Medicine

## 2011-07-08 DIAGNOSIS — K5090 Crohn's disease, unspecified, without complications: Secondary | ICD-10-CM

## 2011-07-08 DIAGNOSIS — R109 Unspecified abdominal pain: Secondary | ICD-10-CM | POA: Insufficient documentation

## 2011-07-08 MED ORDER — GADOBENATE DIMEGLUMINE 529 MG/ML IV SOLN
19.0000 mL | Freq: Once | INTRAVENOUS | Status: AC | PRN
  Administered 2011-07-08: 19 mL via INTRAVENOUS

## 2011-07-12 NOTE — Progress Notes (Signed)
MR faxed to Dr Eula Fried

## 2011-10-12 ENCOUNTER — Ambulatory Visit (INDEPENDENT_AMBULATORY_CARE_PROVIDER_SITE_OTHER): Payer: PRIVATE HEALTH INSURANCE | Admitting: Internal Medicine

## 2011-11-15 ENCOUNTER — Ambulatory Visit (INDEPENDENT_AMBULATORY_CARE_PROVIDER_SITE_OTHER): Payer: PRIVATE HEALTH INSURANCE | Admitting: Internal Medicine

## 2011-11-18 ENCOUNTER — Encounter (INDEPENDENT_AMBULATORY_CARE_PROVIDER_SITE_OTHER): Payer: Self-pay | Admitting: *Deleted

## 2012-01-10 ENCOUNTER — Encounter (INDEPENDENT_AMBULATORY_CARE_PROVIDER_SITE_OTHER): Payer: Self-pay | Admitting: Internal Medicine

## 2012-01-10 ENCOUNTER — Ambulatory Visit (INDEPENDENT_AMBULATORY_CARE_PROVIDER_SITE_OTHER): Payer: PRIVATE HEALTH INSURANCE | Admitting: Internal Medicine

## 2012-01-10 ENCOUNTER — Encounter (INDEPENDENT_AMBULATORY_CARE_PROVIDER_SITE_OTHER): Payer: Self-pay | Admitting: *Deleted

## 2012-01-10 DIAGNOSIS — K5000 Crohn's disease of small intestine without complications: Secondary | ICD-10-CM | POA: Insufficient documentation

## 2012-01-10 DIAGNOSIS — I10 Essential (primary) hypertension: Secondary | ICD-10-CM | POA: Insufficient documentation

## 2012-01-10 DIAGNOSIS — K219 Gastro-esophageal reflux disease without esophagitis: Secondary | ICD-10-CM | POA: Insufficient documentation

## 2012-01-10 MED ORDER — CALCIUM CARBONATE-VITAMIN D 500-200 MG-UNIT PO TABS: 1.0000 | ORAL_TABLET | Freq: Two times a day (BID) | ORAL | Status: DC

## 2012-01-10 MED ORDER — FISH OIL 1000 MG PO CAPS: 1.0000 | ORAL_CAPSULE | Freq: Two times a day (BID) | ORAL | Status: DC

## 2012-01-10 NOTE — Progress Notes (Signed)
Presenting complaint; Followup for small bowel Crohn's disease and GERD. Subjective: Patient is 51 year old Caucasian female who is in for 6 month visit regarding her GI problems. She has no complaints. She does not experience heartburn and regurgitation as long as she watches her diet and stays on  medication. She  usually has 3 bowel movements every morning, loose to semi-formed consistency but denies rectal bleeding melena or abdominal pain. She has gained 5 pounds since her last visit. She says she has very good appetite. She states she is lazy and need to get more active and do exercise. Current Medications: Pentasa 1 g by mouth 4 times a day. Omeprazole 20 mg by mouth every morning. Lisinopril/HCTZ 20/12.5 mg by mouth daily.    Objective: Blood pressure 130/80, pulse 82, temperature 98 F (36.7 C), temperature source Oral, resp. rate 16, height 5' 7"  (1.702 m), weight 219 lb (99.338 kg). Conjunctiva is pink. Sclera is nonicteric Oral pharyngeal mucosa is normal. No neck masses or thyromegaly noted. Cardiac exam with regular rhythm normal S1 and S2. No murmur or gallop noted. Lungs are clear to auscultation. Abdomen is full with linear stria. Bowel sounds are hyperactive. On palpation soft abdomen without tenderness organomegaly or masses.  No LE edema or clubbing noted.  Assessment: #1. Small bowel Crohn's disease. Clinically she appears to be in remission. Her last colonoscopy was in January 2010 revealing active disease involving about 12 cm of neoterminal ileum. Her condition was diagnosed in 2004 and she had a right hemicolectomy at [facility] in 2007. #2. Chronic GERD. She is doing well with therapy. #3. 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 #4. Obesity. Patient is fully aware of what she needs to do to lose weight.   Plan: Continue Pentasa and omeprazole at current  dose. Patient advised to take Os-Cal 500 one tablet twice daily and fish oil 1 g twice daily. 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 Office visit in 6 months.

## 2012-01-10 NOTE — Patient Instructions (Signed)
Please take Os-Cal 500 tablets by mouth daily Fish oil 1 g twice daily or 2 g by mouth daily

## 2012-01-28 ENCOUNTER — Encounter (INDEPENDENT_AMBULATORY_CARE_PROVIDER_SITE_OTHER): Payer: Self-pay | Admitting: *Deleted

## 2012-01-28 ENCOUNTER — Ambulatory Visit (INDEPENDENT_AMBULATORY_CARE_PROVIDER_SITE_OTHER): Payer: PRIVATE HEALTH INSURANCE | Admitting: Internal Medicine

## 2012-01-28 ENCOUNTER — Encounter (INDEPENDENT_AMBULATORY_CARE_PROVIDER_SITE_OTHER): Payer: Self-pay | Admitting: Internal Medicine

## 2012-01-28 DIAGNOSIS — R112 Nausea with vomiting, unspecified: Secondary | ICD-10-CM

## 2012-01-28 DIAGNOSIS — K5010 Crohn's disease of large intestine without complications: Secondary | ICD-10-CM

## 2012-01-28 DIAGNOSIS — R110 Nausea: Secondary | ICD-10-CM | POA: Insufficient documentation

## 2012-01-28 LAB — CBC WITH DIFFERENTIAL/PLATELET
Basophils Absolute: 0 10*3/uL (ref 0.0–0.1)
Basophils Relative: 0 % (ref 0–1)
Eosinophils Absolute: 0.1 10*3/uL (ref 0.0–0.7)
Eosinophils Relative: 2 % (ref 0–5)
HCT: 46.5 % — ABNORMAL HIGH (ref 36.0–46.0)
Hemoglobin: 16.6 g/dL — ABNORMAL HIGH (ref 12.0–15.0)
Lymphocytes Relative: 29 % (ref 12–46)
Lymphs Abs: 1.7 10*3/uL (ref 0.7–4.0)
MCH: 34.8 pg — ABNORMAL HIGH (ref 26.0–34.0)
MCHC: 35.7 g/dL (ref 30.0–36.0)
MCV: 97.5 fL (ref 78.0–100.0)
Monocytes Absolute: 0.5 10*3/uL (ref 0.1–1.0)
Monocytes Relative: 8 % (ref 3–12)
Neutro Abs: 3.7 10*3/uL (ref 1.7–7.7)
Neutrophils Relative %: 61 % (ref 43–77)
Platelets: 255 10*3/uL (ref 150–400)
RBC: 4.77 MIL/uL (ref 3.87–5.11)
RDW: 12.4 % (ref 11.5–15.5)
WBC: 6 10*3/uL (ref 4.0–10.5)

## 2012-01-28 LAB — COMPREHENSIVE METABOLIC PANEL
ALT: 23 U/L (ref 0–35)
AST: 20 U/L (ref 0–37)
Albumin: 4.2 g/dL (ref 3.5–5.2)
Alkaline Phosphatase: 61 U/L (ref 39–117)
BUN: 13 mg/dL (ref 6–23)
CO2: 20 mEq/L (ref 19–32)
Calcium: 9.2 mg/dL (ref 8.4–10.5)
Chloride: 104 mEq/L (ref 96–112)
Creat: 0.79 mg/dL (ref 0.50–1.10)
Glucose, Bld: 92 mg/dL (ref 70–99)
Potassium: 4.5 mEq/L (ref 3.5–5.3)
Sodium: 136 mEq/L (ref 135–145)
Total Bilirubin: 0.5 mg/dL (ref 0.3–1.2)
Total Protein: 6.8 g/dL (ref 6.0–8.3)

## 2012-01-28 LAB — C-REACTIVE PROTEIN: CRP: 0.14 mg/dL (ref <0.60)

## 2012-01-28 NOTE — Patient Instructions (Signed)
Nausea and Vomiting Nausea is a sick feeling that often comes before throwing up (vomiting). Vomiting is a reflex where stomach contents come out of your mouth. Vomiting can cause severe loss of body fluids (dehydration). Children and elderly adults can become dehydrated quickly, especially if they also have diarrhea. Nausea and vomiting are symptoms of a condition or disease. It is important to find the cause of your symptoms. CAUSES   Direct irritation of the stomach lining. This irritation can result from increased acid production (gastroesophageal reflux disease), infection, food poisoning, taking certain medicines (such as nonsteroidal anti-inflammatory drugs), alcohol use, or tobacco use.   Signals from the brain.These signals could be caused by a headache, heat exposure, an inner ear disturbance, increased pressure in the brain from injury, infection, a tumor, or a concussion, pain, emotional stimulus, or metabolic problems.   An obstruction in the gastrointestinal tract (bowel obstruction).   Illnesses such as diabetes, hepatitis, gallbladder problems, appendicitis, kidney problems, cancer, sepsis, atypical symptoms of a heart attack, or eating disorders.   Medical treatments such as chemotherapy and radiation.   Receiving medicine that makes you sleep (general anesthetic) during surgery.  DIAGNOSIS Your caregiver may ask for tests to be done if the problems do not improve after a few days. Tests may also be done if symptoms are severe or if the reason for the nausea and vomiting is not clear. Tests may include:  Urine tests.   Blood tests.   Stool tests.   Cultures (to look for evidence of infection).   X-rays or other imaging studies.  Test results can help your caregiver make decisions about treatment or the need for additional tests. TREATMENT You need to stay well hydrated. Drink frequently but in small amounts.You may wish to drink water, sports drinks, clear broth, or  eat frozen ice pops or gelatin dessert to help stay hydrated.When you eat, eating slowly may help prevent nausea.There are also some antinausea medicines that may help prevent nausea. HOME CARE INSTRUCTIONS   Take all medicine as directed by your caregiver.   If you do not have an appetite, do not force yourself to eat. However, you must continue to drink fluids.   If you have an appetite, eat a normal diet unless your caregiver tells you differently.   Eat a variety of complex carbohydrates (rice, wheat, potatoes, bread), lean meats, yogurt, fruits, and vegetables.   Avoid high-fat foods because they are more difficult to digest.   Drink enough water and fluids to keep your urine clear or pale yellow.   If you are dehydrated, ask your caregiver for specific rehydration instructions. Signs of dehydration may include:   Severe thirst.   Dry lips and mouth.   Dizziness.   Dark urine.   Decreasing urine frequency and amount.   Confusion.   Rapid breathing or pulse.  SEEK IMMEDIATE MEDICAL CARE IF:   You have blood or brown flecks (like coffee grounds) in your vomit.   You have black or bloody stools.   You have a severe headache or stiff neck.   You are confused.   You have severe abdominal pain.   You have chest pain or trouble breathing.   You do not urinate at least once every 8 hours.   You develop cold or clammy skin.   You continue to vomit for longer than 24 to 48 hours.   You have a fever.  MAKE SURE YOU:   Understand these instructions.   Will watch your  condition.   Will get help right away if you are not doing well or get worse.  Document Released: 11/29/2005 Document Revised: 08/11/2011 Document Reviewed: 04/28/2011 [facility] Patient Information 2012 [place], [place].Discharge Instructions Browse by Alphabet A B C D E F G H I J K L M N O P Q R S T U V W X Y Z  Browse by Category All Documents Allergy and  Immunology Anesthesiology [facility] Bioterrorism Cardiology Critical Care Dentistry Dermatology Diabetes Dietary Easy-to-Read Emergency Medicine Endocrinology ENT Family Medicine Forms Gastroenterology Geriatrics Infectious Disease Internal Medicine Labs and Tests Neonatology Nephrology Neurology Obstetrics and Gynecology Oncology Ophthalmology Orthopedics Pediatrics Pharmacology Physical Medicine and Rehabilitation Podiatry Preventative Medicine Procedures Psychiatry Pulmonary Medicine Radiology Rheumatology Surgery Urology Drug Information Sheets All Drug Information Sheets  Browse by Alphabet A B C D E F G H I J K L M N O P Q R S T U V W X Y Z

## 2012-01-28 NOTE — Progress Notes (Addendum)
Subjective:     Patient ID: Elizabeth [name], female   DOB: 1961-03-27, 50 y.o.   MRN: [id number]  HPI Elizabeth [name] is a 51 yr old female. Tuesday night she was nauseated and forced herself to vomit.  She tells me that she is still queasy.  She did eat yesterday. She kept her food down. She also tells me her hemorrhoids are hurting.  Her stools are formed. They are soft. No rectal bleeding or melena.  No abdominal pain.  She does c/o epigastric pain. She says when she burps its taste like fish. (She is on [employer]).  She denies fever.   Hx of small bowel Crohn Disease, and had a right hemicolectomy in 2005 at [facility]. In 2010, She had a colonoscopy primarily for screening purposes. She had active disease involving neoterminal o, descending and sigmoid colon and small diverticula.  Review of Systems see hpi     Current Outpatient Prescriptions  Medication Sig Dispense Refill  . calcium-vitamin D (OSCAL 500/200 D-3) 500-200 MG-UNIT per tablet Take 1 tablet by mouth 2 (two) times daily.      Marland Kitchen lisinopril-hydrochlorothiazide (PRINZIDE,ZESTORETIC) 20-12.5 MG per tablet Take 1 tablet by mouth 2 (two) times daily.      . mesalamine (PENTASA) 500 MG CR capsule Take 500 mg by mouth 4 (four) times daily.      . Omega-3 Fatty Acids (FISH OIL) 1000 MG CAPS Take 1 capsule (1,000 mg total) by mouth 2 (two) times daily.    0  . omeprazole (PRILOSEC) 20 MG capsule Take 20 mg by mouth daily.       Past Medical History  Diagnosis Date  . Crohn's disease   . Elevated transaminase level   . Hypertension    Past Surgical History  Procedure Date  . Hemocolectomy 2005    right  . Abdominal hysterectomy 1980  . Appendectomy   . Colonoscopy    History   Social History  . Marital Status: Married    Spouse Name: N/A    Number of Children: N/A  . Years of Education: N/A   Occupational History  . Not on file.   Social History Main Topics  . Smoking status: Former Smoker    Types: Cigarettes      Quit date: 01/03/2012  . Smokeless tobacco: Never Used  . Alcohol Use: No  . Drug Use: No  . Sexually Active: Not on file   Other Topics Concern  . Not on file   Social History Narrative  . No narrative on file   Family Status  Relation Status Death Age  . Mother Alive   . Father Deceased 50  . Brother Alive   . Daughter Alive    Allergies  Allergen Reactions  . Contrast Media (Iodinated Diagnostic Agents) Hives  . Penicillins Rash    Objective:   Physical Exam  Filed Vitals:   01/28/12 1000  Height: 5' 7"  (1.702 m)  Weight: 208 lb 8 oz (94.575 kg)    Alert and oriented. Skin warm and dry. Oral mucosa is moist.   . Sclera anicteric, conjunctivae is pink. Thyroid not enlarged. No cervical lymphadenopathy. Lungs clear. Heart regular rate and rhythm.  Abdomen is soft. Bowel sounds are positive. No hepatomegaly. No abdominal masses felt. No tenderness.  No edema to lower extremities. Patient is alert and oriented.       Assessment:  Probably viral syndrome. No change in her stools.     Plan:  Probable viral syndrome. CBC and Bmet today., CRP. Bland diet encouraged for next few days. Phenergan 82m po q 6 hrs. # 30 with no refills.

## 2012-02-03 ENCOUNTER — Telehealth (INDEPENDENT_AMBULATORY_CARE_PROVIDER_SITE_OTHER): Payer: Self-pay | Admitting: *Deleted

## 2012-02-03 NOTE — Telephone Encounter (Signed)
Patient called-having bad rectal pain per the patient it is killing her. She is out of work today Saw Terri last Friday 01-28-12 was given Anusol Patient states this set her on fore. The pain is mainly when she wipes and at that time she sees blood. Knows that there are external hemorrhoids but feels there may be interna Hemorrhoids also. Patient advised that Dr. Laural Golden would be contacted and then a return call to her Per Dr. Laural Golden may call in Mycolog Cream 60 grams apply to affected area twice daily This was called To patients pharmacy [facility] and patient notified.

## 2012-05-12 ENCOUNTER — Telehealth (INDEPENDENT_AMBULATORY_CARE_PROVIDER_SITE_OTHER): Payer: Self-pay | Admitting: *Deleted

## 2012-05-12 NOTE — Telephone Encounter (Signed)
Elizabeth [name] called office this morning leaving a message that I call her at work. [phone], I called and the person answering tried several times to get Elizabeth [name] to the phone. I ask that she leave her a message that I had returned her call.

## 2012-05-22 ENCOUNTER — Encounter (INDEPENDENT_AMBULATORY_CARE_PROVIDER_SITE_OTHER): Payer: Self-pay | Admitting: Internal Medicine

## 2012-05-22 ENCOUNTER — Ambulatory Visit (INDEPENDENT_AMBULATORY_CARE_PROVIDER_SITE_OTHER): Payer: PRIVATE HEALTH INSURANCE | Admitting: Internal Medicine

## 2012-05-22 VITALS — BP 108/74 | HR 80 | Temp 98.0°F | Ht 67.0 in | Wt 211.1 lb

## 2012-05-22 DIAGNOSIS — R1013 Epigastric pain: Secondary | ICD-10-CM

## 2012-05-22 MED ORDER — PROMETHAZINE HCL 25 MG PO TABS: 25.0000 mg | ORAL_TABLET | Freq: Four times a day (QID) | ORAL | Status: DC | PRN

## 2012-05-22 MED ORDER — HYDROCODONE-ACETAMINOPHEN 5-500 MG PO TABS: 1.0000 | ORAL_TABLET | ORAL | Status: AC | PRN

## 2012-05-22 NOTE — Patient Instructions (Signed)
H. Pylori  And CMET. She will have labs drawn at work. Rx for Phenergan e-prescribed. Rx Vicodin # 20 with no refills. OV in 6 months.

## 2012-05-22 NOTE — Progress Notes (Signed)
Subjective:     Patient ID: Elizabeth [name], female   DOB: 07/10/1961, 51 y.o.   MRN: [id number]  HPI Elizabeth [name] is a 51 yr old female presenting today with c/o of epigastric pain.  She says she has epigastric pain radiating into her rt upper quadrant pain.  She says she has nausea. Pain is not related to eating. She has this pain off and on. She says she had real bad acid reflux and stopped eating [place], and spicy foods. Acid reflux is much better.  Appetite is good. No weight loss.  She is having a BM x 5-6 times a day. Stools are semi-formed. No mucous or bright red rectal bleeding.    Hx of small bowel Crohn Disease, and had a right hemicolectomy in 2005 at [facility] In 2010, She had a colonoscopy primarily for screening purposes. She had active disease involving neoterminal o, descending and sigmoid colon and small diverticula.  Review of Systems hpi Current Outpatient Prescriptions  Medication Sig Dispense Refill  . calcium-vitamin D (OSCAL 500/200 D-3) 500-200 MG-UNIT per tablet Take 1 tablet by mouth 2 (two) times daily.      Marland Kitchen lisinopril-hydrochlorothiazide (PRINZIDE,ZESTORETIC) 20-12.5 MG per tablet Take 1 tablet by mouth daily.       . mesalamine (PENTASA) 500 MG CR capsule Take 1,000 mg by mouth 3 (three) times daily.       . Omega-3 Fatty Acids (FISH OIL) 1000 MG CAPS Take 1 capsule (1,000 mg total) by mouth 2 (two) times daily.    0  . omeprazole (PRILOSEC) 20 MG capsule Take 20 mg by mouth daily.       Past Medical History  Diagnosis Date  . Crohn's disease   . Elevated transaminase level   . Hypertension    Family Status  Relation Status Death Age  . Mother Alive   . Father Deceased 86  . Brother Alive   . Daughter Alive    History   Social History  . Marital Status: Married    Spouse Name: N/A    Number of Children: N/A  . Years of Education: N/A   Occupational History  . Not on file.   Social History Main Topics  . Smoking status: Former Smoker   Types: Cigarettes    Quit date: 01/03/2012  . Smokeless tobacco: Never Used  . Alcohol Use: No  . Drug Use: No  . Sexually Active: Not on file   Other Topics Concern  . Not on file   Social History Narrative  . No narrative on file   Family Status  Relation Status Death Age  . Mother Alive   . Father Deceased 54  . Brother Alive   . Daughter Alive    Allergies  Allergen Reactions  . Contrast Media (Iodinated Diagnostic Agents) Hives  . Penicillins Rash        Objective:   Physical Exam Filed Vitals:   05/22/12 1021  Height: 5' 7"  (1.702 m)  Weight: 211 lb 1.6 oz (95.754 kg)        Assessment:    Epigastric pain.   Possible PUD.    Plan:     Cmet today. H. Pylori. She will have labs drawn at work.  OV in 6 months. Rx Vicodin # 20. No refills. Refill on Phenergan

## 2012-05-25 ENCOUNTER — Telehealth (INDEPENDENT_AMBULATORY_CARE_PROVIDER_SITE_OTHER): Payer: Self-pay | Admitting: *Deleted

## 2012-05-25 NOTE — Telephone Encounter (Signed)
Patient called and left a message that she had sent all her lab results to [place] after having seen her this week. All labs were normal. Elizabeth [name] states that she his still having the pain in her stomach. She questions if Dr.Rehman would want to x-Rays are something? Patient at work until 3 pm [phone] or may be reached at home [phone]. If further test need to be done , she says next week is not a good week.

## 2012-05-26 ENCOUNTER — Telehealth (INDEPENDENT_AMBULATORY_CARE_PROVIDER_SITE_OTHER): Payer: Self-pay | Admitting: Internal Medicine

## 2012-05-26 DIAGNOSIS — R1013 Epigastric pain: Secondary | ICD-10-CM

## 2012-05-26 NOTE — Telephone Encounter (Signed)
Results of lab given to patient.  She still has some epigastric pain. Will get an US abdomen looking at her ducts.

## 2012-05-29 NOTE — Telephone Encounter (Signed)
[place] sch'd 06/02/12 @ [phone]), npo after midnight

## 2012-05-30 NOTE — Telephone Encounter (Signed)
Waiting for ultrasound results.

## 2012-05-31 ENCOUNTER — Encounter (INDEPENDENT_AMBULATORY_CARE_PROVIDER_SITE_OTHER): Payer: Self-pay

## 2012-06-02 ENCOUNTER — Other Ambulatory Visit (HOSPITAL_COMMUNITY): Payer: PRIVATE HEALTH INSURANCE

## 2012-06-03 NOTE — Telephone Encounter (Signed)
Patient called and message left on answering service. If ultrasound is negative will proceed with EGD.

## 2012-06-09 ENCOUNTER — Ambulatory Visit (HOSPITAL_COMMUNITY)
Admission: RE | Admit: 2012-06-09 | Discharge: 2012-06-09 | Disposition: A | Discharge status: Home / Self Care | Payer: PRIVATE HEALTH INSURANCE | Source: Ambulatory Visit | Attending: Internal Medicine | Admitting: Internal Medicine

## 2012-06-09 DIAGNOSIS — R1013 Epigastric pain: Secondary | ICD-10-CM

## 2012-06-13 ENCOUNTER — Encounter (INDEPENDENT_AMBULATORY_CARE_PROVIDER_SITE_OTHER): Payer: Self-pay | Admitting: Internal Medicine

## 2012-06-13 ENCOUNTER — Ambulatory Visit (INDEPENDENT_AMBULATORY_CARE_PROVIDER_SITE_OTHER): Payer: PRIVATE HEALTH INSURANCE | Admitting: Internal Medicine

## 2012-06-13 ENCOUNTER — Other Ambulatory Visit (INDEPENDENT_AMBULATORY_CARE_PROVIDER_SITE_OTHER): Payer: Self-pay | Admitting: *Deleted

## 2012-06-13 VITALS — BP 110/68 | HR 70 | Temp 97.4°F | Resp 20 | Ht 67.0 in | Wt 211.7 lb

## 2012-06-13 DIAGNOSIS — R1013 Epigastric pain: Secondary | ICD-10-CM

## 2012-06-13 DIAGNOSIS — K5090 Crohn's disease, unspecified, without complications: Secondary | ICD-10-CM

## 2012-06-13 NOTE — Patient Instructions (Signed)
EGD to be scheduled

## 2012-06-13 NOTE — Progress Notes (Signed)
Presenting complaint;  Epigastric pain.  Subjective:  Patient is 51 year old Caucasian female with history of Crohn's disease who was seen by Ms. Deberah Castle NP on 05/22/2012 with recent onset of epigastric pain. She is on PPI chronically for GERD. She denied using NSAIDs. She has normal comprehensive chemistry panel and negative H. pylori serology. She had ultrasound yesterday which was negative for cholelithiasis. She continues to complain of epigastric pain which started about a month ago. It is intermittent and may last for several minutes and is quite intense. She was given prescription for Vicodin but she has not taken any. She denies nausea vomiting melena or rectal bleeding. She generally has 6-8 stools per day stools are soft to loose. She denies lower abdominal pain. She is able to rest at night. She she does not feel any better or worse with meals. She is still smoking but trying to quit. She has a good appetite and has not lost any weight in voluntarily.  Current Medications: Current Outpatient Prescriptions  Medication Sig Dispense Refill  . calcium-vitamin D (OSCAL 500/200 D-3) 500-200 MG-UNIT per tablet Take 1 tablet by mouth 2 (two) times daily.      Marland Kitchen HYDROcodone-acetaminophen (VICODIN) 5-500 MG per tablet 1 tablet as needed.       Marland Kitchen lisinopril-hydrochlorothiazide (PRINZIDE,ZESTORETIC) 20-12.5 MG per tablet Take 1 tablet by mouth daily.       . mesalamine (PENTASA) 500 MG CR capsule Take 1,000 mg by mouth 3 (three) times daily.       . Omega-3 Fatty Acids (FISH OIL) 1000 MG CAPS Take 1 capsule (1,000 mg total) by mouth 2 (two) times daily.    0  . omeprazole (PRILOSEC) 20 MG capsule Take 20 mg by mouth daily.      . promethazine (PHENERGAN) 25 MG tablet Take 1 tablet (25 mg total) by mouth every 6 (six) hours as needed for nausea.  60 tablet  1     Objective: Blood pressure 110/68, pulse 70, temperature 97.4 F (36.3 C), temperature source Oral, resp. rate 20, height 5' 7"   (1.702 m), weight 211 lb 11.2 oz (96.026 kg). Patient appears to be in no acute distress Conjunctiva is pink. Sclera is nonicteric Oropharyngeal mucosa is normal. No neck masses or thyromegaly noted. Cardiac exam with regular rhythm normal S1 and S2. No murmur or gallop noted. Lungs are clear to auscultation. Abdomen is full. Bowel sounds are normal. She has midepigastric tenderness. Liver edge is indistinct low ostium. Spleen is not palpable. Rectal exam is deferred she had Hemoccult last week which was negative.  No LE edema or clubbing noted.  Labs/studies Results: CBC from 06/12/2012 WBC 6.8 H&H is 15.4 and 44 platelet count 247K. H. pylori serology negative and normal comprehensive chemistry panel on 05/23/2012(scanned) Ultrasound of 06/12/2012 negative for cholelithiasis and reveals echogenic liver.    Assessment:  #1 .Epigastric pain not responding to PPI. H. pylori serology and LFTs are normal in the distal negative for cholelithiasis. Symptoms not typical of peptic ulcer disease but it needs to ruled out along with gastroduodenal Crohn's disease. #2. Crohn's disease appears to be in remission.   Plan:  Esophagogastroduodenoscopy in the future.

## 2012-06-14 ENCOUNTER — Encounter (HOSPITAL_COMMUNITY): Payer: Self-pay | Admitting: Pharmacy Technician

## 2012-06-16 ENCOUNTER — Encounter (HOSPITAL_COMMUNITY)
Admission: RE | Disposition: A | Discharge status: Home Health | Payer: Self-pay | Source: Ambulatory Visit | Attending: Internal Medicine

## 2012-06-16 ENCOUNTER — Encounter (HOSPITAL_COMMUNITY): Payer: Self-pay | Admitting: *Deleted

## 2012-06-16 ENCOUNTER — Telehealth (INDEPENDENT_AMBULATORY_CARE_PROVIDER_SITE_OTHER): Payer: Self-pay | Admitting: *Deleted

## 2012-06-16 ENCOUNTER — Ambulatory Visit (HOSPITAL_COMMUNITY)
Admission: RE | Admit: 2012-06-16 | Discharge: 2012-06-16 | Disposition: A | Discharge status: Home Health | Payer: PRIVATE HEALTH INSURANCE | Source: Ambulatory Visit | Attending: Internal Medicine | Admitting: Internal Medicine

## 2012-06-16 DIAGNOSIS — K2960 Other gastritis without bleeding: Secondary | ICD-10-CM

## 2012-06-16 DIAGNOSIS — Z79899 Other long term (current) drug therapy: Secondary | ICD-10-CM | POA: Insufficient documentation

## 2012-06-16 DIAGNOSIS — R1013 Epigastric pain: Secondary | ICD-10-CM

## 2012-06-16 DIAGNOSIS — I10 Essential (primary) hypertension: Secondary | ICD-10-CM | POA: Insufficient documentation

## 2012-06-16 DIAGNOSIS — K5090 Crohn's disease, unspecified, without complications: Secondary | ICD-10-CM

## 2012-06-16 DIAGNOSIS — Z9889 Other specified postprocedural states: Secondary | ICD-10-CM

## 2012-06-16 DIAGNOSIS — K449 Diaphragmatic hernia without obstruction or gangrene: Secondary | ICD-10-CM

## 2012-06-16 HISTORY — PX: ESOPHAGOGASTRODUODENOSCOPY: SHX5428

## 2012-06-16 SURGERY — EGD (ESOPHAGOGASTRODUODENOSCOPY)
Anesthesia: Moderate Sedation

## 2012-06-16 MED ORDER — MEPERIDINE HCL 25 MG/ML IJ SOLN
INTRAMUSCULAR | Status: DC | PRN
  Administered 2012-06-16: 25 mg via INTRAVENOUS

## 2012-06-16 MED ORDER — OMEPRAZOLE 20 MG PO CPDR: 40.0000 mg | DELAYED_RELEASE_CAPSULE | Freq: Every day | ORAL | Status: DC

## 2012-06-16 MED ORDER — SODIUM CHLORIDE 0.45 % IV SOLN
Freq: Once | INTRAVENOUS | Status: AC
  Administered 2012-06-16: 1000 mL via INTRAVENOUS

## 2012-06-16 MED ORDER — BUTAMBEN-TETRACAINE-BENZOCAINE 2-2-14 % EX AERO
INHALATION_SPRAY | CUTANEOUS | Status: DC | PRN
  Administered 2012-06-16: 1 via TOPICAL

## 2012-06-16 MED ORDER — MIDAZOLAM HCL 5 MG/5ML IJ SOLN
INTRAMUSCULAR | Status: DC | PRN
  Administered 2012-06-16 (×4): 2 mg via INTRAVENOUS

## 2012-06-16 MED ORDER — DICYCLOMINE HCL 10 MG PO CAPS: 10.0000 mg | ORAL_CAPSULE | Freq: Three times a day (TID) | ORAL | Status: DC

## 2012-06-16 MED ORDER — MIDAZOLAM HCL 5 MG/5ML IJ SOLN
INTRAMUSCULAR | Status: AC
  Filled 2012-06-16: qty 10

## 2012-06-16 MED ORDER — MEPERIDINE HCL 50 MG/ML IJ SOLN
INTRAMUSCULAR | Status: AC
  Filled 2012-06-16: qty 1

## 2012-06-16 MED ORDER — STERILE WATER FOR IRRIGATION IR SOLN
Status: DC | PRN
  Administered 2012-06-16: 10:00:00

## 2012-06-16 NOTE — Op Note (Signed)
EGD PROCEDURE REPORT  PATIENT:  Elizabeth [name]  MR#:  [id number] Birthdate:  [birth year], 51 y.o., female Endoscopist:  Dr. Rogene Houston, MD Procedure Date: 06/16/2012  Procedure:   EGD  Indications:  Patient is 51 year old Caucasian female with recurrent epigastric pain now for 5 weeks duration not responding therapy. She has ileal Crohn's disease appears to be in remission. Ultrasound negative for cholelithiasis. She is undergoing diagnostic EGD to            Informed Consent:  The risks, benefits, alternatives & imponderables which include, but are not limited to, bleeding, infection, perforation, drug reaction and potential missed lesion have been reviewed.  The potential for biopsy, lesion removal, esophageal dilation, etc. have also been discussed.  Questions have been answered.  All parties agreeable.  Please see history & physical in medical record for more information.  Medications:  Demerol 25 mg IV Versed 8 mg IV Cetacaine spray topically for oropharyngeal anesthesia  Description of procedure:  The endoscope was introduced through the mouth and advanced to the second portion of the duodenum without difficulty or limitations. The mucosal surfaces were surveyed very carefully during advancement of the scope and upon withdrawal.  Findings:  Esophagus:  Mucosa of the esophagus was normal. Single erosion noted at GE junction. GEJ:  33 cm Hiatus:  37 cm Stomach:  Stomach was empty and distended very well with insufflation. Folds in the proximal stomach are normal. Examination of mucosa at gastric body, antrum, pyloric channel, angularis, fundus and cardia was normal. Duodenum:  Normal bulbar and post bulbar mucosa.  Therapeutic/Diagnostic Maneuvers Performed:  None  Complications:  None  Impression: Small sliding hiatal hernia was single erosion at GE junction otherwise normal esophagogastroduodenoscopy.  Recommendations:  Anti-reflux measures. Increase omeprazole to 40 mg by  mouth every morning. Dicyclomine 10 mg by mouth 3 times a day. Abdominopelvic CT with contrast to be scheduled.  REHMAN,NAJEEB U  06/16/2012  10:26 AM  CC: Dr. Cleda Mccreedy, MD & Dr. Rayne Du ref. provider found

## 2012-06-16 NOTE — Telephone Encounter (Signed)
Ann- Dr.Rehman would like for Elizabeth [name] to have Abdominal/Pelvic CT with contrast next week. Patient is allergic to the contrast and per Dr.Rehman she will need to have Benadryl and Prednisone prior to CT

## 2012-06-16 NOTE — H&P (Signed)
Elizabeth [name] is an 51 y.o. female.   Chief Complaint: Patient is here for esophagogastroduodenoscopy. HPI: Patient is 51 year old Caucasian female who presents with over a month history of epigastric pain. She is on chronic PPI therapy for GERD but hasn't had this pain. She had hepatobiliary ultrasound which is negative for cholelithiasis. It showed echogenic liver. She has history of fatty liver with mildly elevated transaminases with recent is within normal limits. She denies nausea vomiting melena or rectal bleeding. She actually does not have any pain this morning.  Past Medical History  Diagnosis Date  . Crohn's disease   . Elevated transaminase level   . Hypertension     Past Surgical History  Procedure Date  . Hemocolectomy 2005    right  . Abdominal hysterectomy 1980  . Appendectomy   . Colonoscopy     Family History  Problem Relation Age of Onset  . Healthy Mother   . Lung cancer Father   . Lung cancer Brother   . Healthy Daughter    Social History:  reports that she quit smoking about 5 months ago. Her smoking use included Cigarettes. She has never used smokeless tobacco. She reports that she does not drink alcohol or use illicit drugs.  Allergies:  Allergies  Allergen Reactions  . Contrast Media (Iodinated Diagnostic Agents) Hives and Rash  . Penicillins Hives and Rash    Medications Prior to Admission  Medication Sig Dispense Refill  . calcium-vitamin D (OSCAL 500/200 D-3) 500-200 MG-UNIT per tablet Take 1 tablet by mouth 2 (two) times daily.      Marland Kitchen lisinopril-hydrochlorothiazide (PRINZIDE,ZESTORETIC) 20-12.5 MG per tablet Take 1 tablet by mouth daily.       . mesalamine (PENTASA) 500 MG CR capsule Take 1,000 mg by mouth 3 (three) times daily.       . Omega-3 Fatty Acids (FISH OIL) 1000 MG CAPS Take 1 capsule (1,000 mg total) by mouth 2 (two) times daily.    0  . omeprazole (PRILOSEC) 20 MG capsule Take 20 mg by mouth daily.        No results found for this or  any previous visit (from the past 48 hour(s)). No results found.  ROS  Blood pressure 106/62, pulse 62, temperature 97.7 F (36.5 C), temperature source Oral, resp. rate 16, SpO2 100.00%. Physical Exam  Constitutional: She appears well-developed and well-nourished.  HENT:  Mouth/Throat: Oropharynx is clear and moist.  Eyes: Conjunctivae are normal. No scleral icterus.  Neck: No thyromegaly present.  Cardiovascular: Normal rate, regular rhythm and normal heart sounds.   No murmur heard. Respiratory: Effort normal and breath sounds normal.  GI: Soft. She exhibits no distension and no mass. There is no tenderness.  Lymphadenopathy:    She has no cervical adenopathy.  Neurological: She is alert.  Skin: Skin is warm and dry.     Assessment/Plan Recurrent epigastric pain. Ultrasound negative for cholelithiasis. Ileal Crohn's disease. Diagnostic EGD.  REHMAN,NAJEEB U 06/16/2012, 9:53 AM

## 2012-06-19 ENCOUNTER — Encounter (INDEPENDENT_AMBULATORY_CARE_PROVIDER_SITE_OTHER): Payer: Self-pay | Admitting: Internal Medicine

## 2012-06-19 ENCOUNTER — Encounter (INDEPENDENT_AMBULATORY_CARE_PROVIDER_SITE_OTHER): Payer: Self-pay | Admitting: *Deleted

## 2012-06-19 ENCOUNTER — Telehealth (INDEPENDENT_AMBULATORY_CARE_PROVIDER_SITE_OTHER): Payer: Self-pay | Admitting: *Deleted

## 2012-06-19 ENCOUNTER — Other Ambulatory Visit (INDEPENDENT_AMBULATORY_CARE_PROVIDER_SITE_OTHER): Payer: Self-pay | Admitting: Internal Medicine

## 2012-06-19 DIAGNOSIS — R1013 Epigastric pain: Secondary | ICD-10-CM

## 2012-06-19 MED ORDER — PREDNISONE 50 MG PO TABS: ORAL_TABLET | ORAL | Status: DC

## 2012-06-19 MED ORDER — DIPHENHYDRAMINE HCL 50 MG PO TABS: 50.0000 mg | ORAL_TABLET | Freq: Once | ORAL | Status: DC

## 2012-06-19 NOTE — Telephone Encounter (Signed)
CT A/P sch'd 06/23/12 @ 700, pick up contrast, patient aware

## 2012-06-19 NOTE — Telephone Encounter (Signed)
Patient is having CT A/P w/ contrast and she's allergic to dye -- she will need the following meds send to her pharmacy to be premedicated  Prednisone 50 mg PO at 13 hours, 7 hours and 1 hour prior to scan  Benadryl 50 mg PO at 1 hour prior to scan

## 2012-06-19 NOTE — Telephone Encounter (Signed)
This encounter was created in error - please disregard.

## 2012-06-19 NOTE — Progress Notes (Signed)
This encounter was created in error - please disregard.

## 2012-06-21 ENCOUNTER — Encounter (HOSPITAL_COMMUNITY): Payer: Self-pay | Admitting: Internal Medicine

## 2012-06-23 ENCOUNTER — Other Ambulatory Visit (HOSPITAL_COMMUNITY): Payer: PRIVATE HEALTH INSURANCE

## 2012-06-23 ENCOUNTER — Encounter (HOSPITAL_COMMUNITY): Payer: Self-pay

## 2012-06-23 ENCOUNTER — Ambulatory Visit (HOSPITAL_COMMUNITY)
Admission: RE | Admit: 2012-06-23 | Discharge: 2012-06-23 | Disposition: A | Discharge status: Home / Self Care | Payer: PRIVATE HEALTH INSURANCE | Source: Ambulatory Visit | Attending: Internal Medicine | Admitting: Internal Medicine

## 2012-06-23 DIAGNOSIS — K5730 Diverticulosis of large intestine without perforation or abscess without bleeding: Secondary | ICD-10-CM | POA: Insufficient documentation

## 2012-06-23 DIAGNOSIS — R1013 Epigastric pain: Secondary | ICD-10-CM

## 2012-06-23 DIAGNOSIS — R109 Unspecified abdominal pain: Secondary | ICD-10-CM | POA: Insufficient documentation

## 2012-06-23 MED ORDER — IOHEXOL 300 MG/ML  SOLN
100.0000 mL | Freq: Once | INTRAMUSCULAR | Status: AC | PRN
  Administered 2012-06-23: 100 mL via INTRAVENOUS

## 2012-07-06 ENCOUNTER — Encounter (INDEPENDENT_AMBULATORY_CARE_PROVIDER_SITE_OTHER): Payer: Self-pay

## 2012-08-08 ENCOUNTER — Ambulatory Visit (INDEPENDENT_AMBULATORY_CARE_PROVIDER_SITE_OTHER): Payer: PRIVATE HEALTH INSURANCE | Admitting: Internal Medicine

## 2012-09-12 ENCOUNTER — Ambulatory Visit (INDEPENDENT_AMBULATORY_CARE_PROVIDER_SITE_OTHER): Payer: PRIVATE HEALTH INSURANCE | Admitting: Internal Medicine

## 2012-09-12 ENCOUNTER — Encounter (INDEPENDENT_AMBULATORY_CARE_PROVIDER_SITE_OTHER): Payer: Self-pay | Admitting: Internal Medicine

## 2012-09-12 VITALS — BP 128/74 | HR 76 | Temp 98.4°F | Resp 20 | Ht 67.0 in | Wt 211.1 lb

## 2012-09-12 DIAGNOSIS — K5090 Crohn's disease, unspecified, without complications: Secondary | ICD-10-CM

## 2012-09-12 DIAGNOSIS — K5732 Diverticulitis of large intestine without perforation or abscess without bleeding: Secondary | ICD-10-CM

## 2012-09-12 MED ORDER — METRONIDAZOLE 500 MG PO TABS: 500.0000 mg | ORAL_TABLET | Freq: Two times a day (BID) | ORAL | Status: DC

## 2012-09-12 MED ORDER — CIPROFLOXACIN HCL 500 MG PO TABS: 500.0000 mg | ORAL_TABLET | Freq: Two times a day (BID) | ORAL | Status: DC

## 2012-09-12 NOTE — Progress Notes (Signed)
Presenting complaint;  Lower abdominal pain. Abnormal CT suggesting sigmoid diverticulitis.  Subjective:  Elizabeth [name] is 51 year old Caucasian female history of ileocolonic Crohn's disease who was last evaluated 3 months ago for epigastric pain. She had abdominopelvic CT as well as EGD. She responded to PPI therapy. She was doing well when she went to bed on 09/08/2012. She woke at 4 AM on 09/09/2012 with pain in lower abdomen and she thought she had pulled muscle. Pain radiated into her back on the right side. As the day progressed the pain got worse. She could not walk. She thought she pulled muscle. She did not experience fever chills nausea vomiting diarrhea rectal bleeding melena dysuria or hematuria. She was seen by Dr. Eula Fried yesterday and  urine analysis was abnormal. She was suspected to have kidney stones. She underwent abdominopelvic CT at [facility] yesterday which revealed inflammatory changes involving sigmoid colon. She was felt to have sigmoid diverticulitis. Study also suggested  prior right hemicolectomy but no evidence of bowel wall thickening renal or bladder calculi. Patient was begun on Cipro and Flagyl and also given prescription for pain medication. Today she is some better. Pain is primarily in hypogastric region and radiates superolaterally to the right. She is staying on full liquids. She does not have an appetite. She is having 3 soft to formed stools daily. She tells me that this is a different type of pain than the one she is experienced with Crohn's flare up. She does not take any NSAIDs. She started to smoke again 2 days ago. She is hoping for quick recovery as she wants to look after her mother who is having surgery next week. Patient's last colonoscopy was in January 2010 revealing ulceration at terminal ileum along with erosions at the ascending and sigmoid colon as well as few diverticula at sigmoid colon.  Current Medications: Current Outpatient Prescriptions  Medication Sig  Dispense Refill  . ciprofloxacin (CIPRO) 500 MG tablet Take 500 mg by mouth 2 (two) times daily.      Marland Kitchen lisinopril-hydrochlorothiazide (PRINZIDE,ZESTORETIC) 20-12.5 MG per tablet Take 1 tablet by mouth daily.       . mesalamine (PENTASA) 500 MG CR capsule Take 1,000 mg by mouth 3 (three) times daily.       . metroNIDAZOLE (FLAGYL) 500 MG tablet Take 500 mg by mouth 3 (three) times daily.      . Omega-3 Fatty Acids (FISH OIL) 1000 MG CAPS Take 1 capsule (1,000 mg total) by mouth 2 (two) times daily.    0  . omeprazole (PRILOSEC) 20 MG capsule Take 2 capsules (40 mg total) by mouth daily.  30 capsule  5  . oxyCODONE-acetaminophen (PERCOCET) 10-325 MG per tablet Take 1 tablet by mouth as needed.      . promethazine (PHENERGAN) 25 MG tablet Take 25 mg by mouth as needed.      . calcium-vitamin D (OSCAL 500/200 D-3) 500-200 MG-UNIT per tablet Take 1 tablet by mouth 2 (two) times daily.      Marland Kitchen dicyclomine (BENTYL) 10 MG capsule Take 1 capsule (10 mg total) by mouth 4 (four) times daily -  before meals and at bedtime.  90 capsule  2  . diphenhydrAMINE (BENADRYL) 50 MG tablet Take 1 tablet (50 mg total) by mouth once.  30 tablet  0     Objective: Blood pressure 128/74, pulse 76, temperature 98.4 F (36.9 C), temperature source Oral, resp. rate 20, height 5' 7"  (1.702 m), weight 211 lb 1.6 oz (95.754 kg). Patient  is alert and does not appear to be in acute distress. Conjunctiva is pink. Sclera is nonicteric Oropharyngeal mucosa is normal. No neck masses or thyromegaly noted. Cardiac exam with regular rhythm normal S1 and S2. No murmur or gallop noted. Lungs are clear to auscultation. Abdomen is full. Bowel sounds are normal. She has mild generalized tenderness which is very pronounced in hypogastric area with some guarding. No organomegaly or masses noted. No LE edema or clubbing noted.  Labs/studies Results: Current CT reviewed along with CT from July 2013.   Assessment:  Acute lower  abdominal pain appears to be secondary to sigmoid diverticulitis. On unenhanced CT there does not appear to be inflammatory changes involving neoterminal ileum this remains a possibility in a patient with known Crohn's disease. Need to make sure she does not have walled off area perforation with pseudo-diverticulitis. One week of antibiotic therapy would not be sufficient. I doubt that she would be a will to return to work this week.   Plan: Full liquid diet for 48 hours and then back to her usual diet. She'll continue Cipro and metronidazole until her office visit in 2 weeks. New prescription given for both of these medications. She can return to work next Monday if she is feeling well otherwise he'll need another note. She was given note for work. Small bowel follow-through on 09/15/2012. Office visit in 2 weeks

## 2012-09-12 NOTE — Patient Instructions (Signed)
Stay on full liquids for 48 hours. Small bowel follow-through to be scheduled.

## 2012-09-15 ENCOUNTER — Ambulatory Visit (HOSPITAL_COMMUNITY)
Admission: RE | Admit: 2012-09-15 | Discharge: 2012-09-15 | Disposition: A | Discharge status: Home / Self Care | Payer: PRIVATE HEALTH INSURANCE | Source: Ambulatory Visit | Attending: Internal Medicine | Admitting: Internal Medicine

## 2012-09-15 DIAGNOSIS — K5090 Crohn's disease, unspecified, without complications: Secondary | ICD-10-CM

## 2012-09-15 DIAGNOSIS — K5710 Diverticulosis of small intestine without perforation or abscess without bleeding: Secondary | ICD-10-CM | POA: Insufficient documentation

## 2012-09-15 DIAGNOSIS — R109 Unspecified abdominal pain: Secondary | ICD-10-CM | POA: Insufficient documentation

## 2012-09-18 ENCOUNTER — Ambulatory Visit (INDEPENDENT_AMBULATORY_CARE_PROVIDER_SITE_OTHER): Payer: PRIVATE HEALTH INSURANCE | Admitting: Internal Medicine

## 2012-09-25 ENCOUNTER — Ambulatory Visit (INDEPENDENT_AMBULATORY_CARE_PROVIDER_SITE_OTHER): Payer: PRIVATE HEALTH INSURANCE | Admitting: Internal Medicine

## 2012-09-25 ENCOUNTER — Encounter (INDEPENDENT_AMBULATORY_CARE_PROVIDER_SITE_OTHER): Payer: Self-pay | Admitting: Internal Medicine

## 2012-09-25 VITALS — BP 128/74 | HR 78 | Temp 98.6°F | Resp 20 | Ht 67.0 in | Wt 214.5 lb

## 2012-09-25 DIAGNOSIS — K5732 Diverticulitis of large intestine without perforation or abscess without bleeding: Secondary | ICD-10-CM

## 2012-09-25 NOTE — Patient Instructions (Signed)
High fiber diet. Must chew food thoroughly before swallowing.

## 2012-09-25 NOTE — Progress Notes (Signed)
Presenting complaint;  Followup for sigmoid diverticulitis.  Subjective:  Patient is 51 year old Caucasian female who has history of small bowel Crohn's disease who developed sudden onset of lower abdominal pain and was evaluated by Dr. Eula Fried and diagnosed with sigmoid diverticulitis. She was already on antibiotics by the time she was seen by me on 09/12/2012. She was maintained on Cipro and metronidazole. She developed itching and stopped her antibiotics 3 days ago. She did not experience rash or fever. Her abdominal pain has completely resolved. Her appetite is back to normal.   Current Medications: Current Outpatient Prescriptions  Medication Sig Dispense Refill  . b complex vitamins tablet Take 1 tablet by mouth daily.      Marland Kitchen lisinopril-hydrochlorothiazide (PRINZIDE,ZESTORETIC) 20-12.5 MG per tablet Take 1 tablet by mouth daily.       . mesalamine (PENTASA) 500 MG CR capsule Take 1,000 mg by mouth 3 (three) times daily.       . Omega-3 Fatty Acids (FISH OIL) 1000 MG CAPS Take 1 capsule (1,000 mg total) by mouth 2 (two) times daily.    0  . omeprazole (PRILOSEC) 20 MG capsule Take 2 capsules (40 mg total) by mouth daily.  30 capsule  5  . oxyCODONE-acetaminophen (PERCOCET) 10-325 MG per tablet Take 1 tablet by mouth as needed.      . promethazine (PHENERGAN) 25 MG tablet Take 25 mg by mouth as needed.      . calcium-vitamin D (OSCAL 500/200 D-3) 500-200 MG-UNIT per tablet Take 1 tablet by mouth 2 (two) times daily.      . ciprofloxacin (CIPRO) 500 MG tablet Take 1 tablet (500 mg total) by mouth 2 (two) times daily.  28 tablet  0  . metroNIDAZOLE (FLAGYL) 500 MG tablet Take 1 tablet (500 mg total) by mouth 2 (two) times daily.  28 tablet  0     Objective: Blood pressure 128/74, pulse 78, temperature 98.6 F (37 C), temperature source Oral, resp. rate 20, height 5' 7"  (1.702 m), weight 214 lb 8 oz (97.297 kg). Patient is alert and in no acute distress. Conjunctiva is pink. Sclera is  nonicteric Oropharyngeal mucosa is normal. No neck masses or thyromegaly noted. Cardiac exam with regular rhythm normal S1 and S2. No murmur or gallop noted. Lungs are clear to auscultation. Abdomen is full with normal bowel sounds. Abdomen is soft and nontender without organomegaly or masses.  No LE edema or clubbing noted.  Labs/studies Results: Small bowel follow-through done on 09/15/2012. No abnormality noted to small bowel with normal appearing neoterminal ileum and patent ileocolonic anastomosis.   Assessment:  #1. Sigmoid diverticulitis responding to antibiotics. She has no pain or abdominal tenderness. #2. History of small bowel Crohn's disease. Small bowel study was obtained to rule out pseudo-diverticulitis secondary to ileal disease but this does not appear to be the case.   Plan:  Patient will call if symptoms recur.  patient advised not to take NSAIDs. Office visit in 6 months.

## 2012-09-28 ENCOUNTER — Encounter (INDEPENDENT_AMBULATORY_CARE_PROVIDER_SITE_OTHER): Payer: Self-pay

## 2012-12-14 ENCOUNTER — Telehealth (INDEPENDENT_AMBULATORY_CARE_PROVIDER_SITE_OTHER): Payer: Self-pay | Admitting: *Deleted

## 2012-12-14 NOTE — Telephone Encounter (Signed)
Elizabeth [name] left a message on voicemail stating that she is having pain in both hips , X-Ray on Tuesday  And has not heard about results yet.She is taking Tylenol for this pain but it is not working. She wants to know what else she can take? May be reached at work [phone]

## 2012-12-15 NOTE — Telephone Encounter (Signed)
I advised her she could try Tylenol or Advil. Use Advil sparingly. She needs to address this with her PCP

## 2012-12-15 NOTE — Telephone Encounter (Signed)
Patient called multiple times. She told me that she had had x-rays on Tuesday,ordered by Dr.Qureshi, and Dr. Eula Fried was out of the office until Monday. The nurse told her that there was something on the X-Ray. She has been taking Tylenol but it has not helped the hip pain. Elizabeth Setzer,NP called the patient ,refer to her telephone encounter. Elizabeth [name] is wanting to know what she can take for the pain that will be safe for her. She states that Dr.Rehman had told her in the past not to take Motrin,ect. She also requested that she would like for Dr.Rehman to look at her X-Rays. It was explained to the patient that Dr.Rehman did not have access to the [facility].  Dr.Rehman was made aware. He states that Dr.Qureshi would have to address this with the patient . Dr.Qureshi was in his office 12/14/12 and was on call last night. Patient called and made aware.

## 2012-12-15 NOTE — Telephone Encounter (Signed)
Patient called at her work number. I was told that she was in a meeting, They ask to take a message. I ask that they let her know that Dr.Rehman says that Dr.Qureshi will need to handle and this is in responses to her conversations with our office yesterday.

## 2013-02-14 ENCOUNTER — Telehealth (INDEPENDENT_AMBULATORY_CARE_PROVIDER_SITE_OTHER): Payer: Self-pay | Admitting: *Deleted

## 2013-02-14 NOTE — Telephone Encounter (Signed)
Elizabeth [name]'s mother called to let Dr. Laural [name] know Elizabeth [name] is vomiting and legs are hurting. Elizabeth [name] could not call, she was at work. Advised Elizabeth [name] that  Dr. Laural [name] is not in the office but Terri would be glad to see her. Elizabeth [name] said Elizabeth [name] would not see anyone except the doctor.  Spoke with Elizabeth [name], the nurse, and she advised me to let Elizabeth [name] know Elizabeth [name] needed to go to the ED if she would not see Elizabeth [name], that she is sorry but Dr. Laural [name] is not in the office. Mother was advised and asked if Elizabeth [name] or Dr. Laural [name] would call her after 5:00 pm. Told her no that Dr. Laural [name] was not in the office, but Elizabeth [name] did have an apt with him on 03/26/13.

## 2013-02-14 NOTE — Telephone Encounter (Signed)
Agree, Patient's mother was advised that Dr.Rehman was not in office and that Karna Christmas would be more that happy to evaluate her. Ms.Murata refuses to see her. It was ,in addition to a ED visit, that the patient see her PCP.

## 2013-03-26 ENCOUNTER — Encounter (INDEPENDENT_AMBULATORY_CARE_PROVIDER_SITE_OTHER): Payer: Self-pay | Admitting: Internal Medicine

## 2013-03-26 ENCOUNTER — Encounter (INDEPENDENT_AMBULATORY_CARE_PROVIDER_SITE_OTHER): Payer: Self-pay | Admitting: *Deleted

## 2013-03-26 ENCOUNTER — Ambulatory Visit (INDEPENDENT_AMBULATORY_CARE_PROVIDER_SITE_OTHER): Payer: PRIVATE HEALTH INSURANCE | Admitting: Internal Medicine

## 2013-03-26 VITALS — BP 118/74 | HR 78 | Temp 98.6°F | Resp 18 | Ht 67.0 in | Wt 209.8 lb

## 2013-03-26 DIAGNOSIS — R5383 Other fatigue: Secondary | ICD-10-CM | POA: Insufficient documentation

## 2013-03-26 DIAGNOSIS — R1031 Right lower quadrant pain: Secondary | ICD-10-CM | POA: Insufficient documentation

## 2013-03-26 DIAGNOSIS — K5090 Crohn's disease, unspecified, without complications: Secondary | ICD-10-CM

## 2013-03-26 DIAGNOSIS — R52 Pain, unspecified: Secondary | ICD-10-CM

## 2013-03-26 DIAGNOSIS — R5381 Other malaise: Secondary | ICD-10-CM

## 2013-03-26 DIAGNOSIS — R1032 Left lower quadrant pain: Secondary | ICD-10-CM | POA: Insufficient documentation

## 2013-03-26 MED ORDER — PREDNISONE (PAK) 10 MG PO TABS: 50.0000 mg | ORAL_TABLET | Freq: Three times a day (TID) | ORAL | Status: DC

## 2013-03-26 MED ORDER — HYDROCODONE-ACETAMINOPHEN 5-300 MG PO TABS: 1.0000 | ORAL_TABLET | Freq: Four times a day (QID) | ORAL | Status: DC | PRN

## 2013-03-26 NOTE — Patient Instructions (Signed)
Full liquid diet. Call if he have a temp greater than 101 F. Physician will contact with results of blood work and CT when completed

## 2013-03-26 NOTE — Progress Notes (Signed)
Presenting complaint;  Lower abdominal pain of 3 days' duration.  Subjective:  Patient is 52 year old Caucasian female with history of small bowel Crohn's disease who presents with three-day history of pain across her lower abdomen. Pain started more or less suddenly on Friday described as intense and coming in waves across lower abdomen associated with nausea without vomiting fever or chills. Last week she passes hard stool and felt she was constipated but on most days she has semi-formed or soft stools 3-5 per day. 2 days ago she started taking Cipro and metronidazole today she is feeling slightly better. She is having constant pain and it reminds her of the time when she had diverticulitis 6 months ago. She does not have a good appetite. She denies urinary symptoms. She complains of severe tiredness but she is able to work full-time. She also complains of insomnia. She states even on days when she is able to sleep well she wakes up tired. She has lost 5 pounds since her last visit. She has gone back to smoking cigarettes and now smoking about 3 cigarettes per day. She does not take NSAIDs. Her heartburn is well controlled with therapy. She does not feel depressed.  Current Medications: Current Outpatient Prescriptions  Medication Sig Dispense Refill  . b complex vitamins tablet Take 1 tablet by mouth daily.      . ciprofloxacin (CIPRO) 500 MG tablet Take 500 mg by mouth 2 (two) times daily.      Marland Kitchen lisinopril-hydrochlorothiazide (PRINZIDE,ZESTORETIC) 20-12.5 MG per tablet Take 1 tablet by mouth daily.       . mesalamine (PENTASA) 500 MG CR capsule Take 1,000 mg by mouth 3 (three) times daily.       . metroNIDAZOLE (FLAGYL) 500 MG tablet Take 1 tablet (500 mg total) by mouth 2 (two) times daily.  28 tablet  0  . omeprazole (PRILOSEC) 20 MG capsule Take 2 capsules (40 mg total) by mouth daily.  30 capsule  5  . promethazine (PHENERGAN) 25 MG tablet Take 25 mg by mouth as needed.       No current  facility-administered medications for this visit.     Objective: Blood pressure 118/74, pulse 78, temperature 98.6 F (37 C), temperature source Oral, resp. rate 18, height 5' 7"  (1.702 m), weight 209 lb 12.8 oz (95.165 kg). Patient is alert and appears to be in some distress. She is holding her lower abdomen as she move from chair to examination table.  Conjunctiva is pink. Sclera is nonicteric Oropharyngeal mucosa is normal. No neck masses or thyromegaly noted. Cardiac exam with regular rhythm normal S1 and S2. No murmur or gallop noted. Lungs are clear to auscultation. Abdomen is symmetrical. Bowel sounds are normal. Upper half of the abdomen is soft and without tenderness. She has tenderness across lower abdomen which is moderate in the middle with some guarding but no rebound. No organomegaly or masses noted.  No LE edema or clubbing noted.  Labs/studies Reviewed. Abdominopelvic CT from July 2013 and 09/11/2012. Small bowel follow-through from 09/20/2012 which was within normal limits with patent ileocolonic anastomosis and normal neoterminal ileum.   Assessment:  #1.Acute lower abdominal pain. I am concerned that she could have sigmoid diverticulitis light she had in September 2013. I doubt that she has relapse of Crohn's disease given her presentation and the fact  She was constipated before onset of her acute pain.  #2.GERD . Symptoms well controlled with therapy  #3.Profound tiredness affecting quality of her life. She does  not appear to be animic. Need to rule out hypothyroidism. She could also have sleep apnea causing the symptoms.    Plan:  Continue Cipro and metronidazole at current dose. Call for fever greater than 101 F. Hydrocodone/acetaminophen 5/300 by mouth every 6 when necessary. CBC, CRP, comprehensive chemistry panel andTSH. Abdominopelvic CT with contrast. She will be premedicated with prednisone and Benadryl since she develops hives with contrast. Office  visit in 2 weeks.

## 2013-03-27 LAB — COMPREHENSIVE METABOLIC PANEL
AST: 30 U/L (ref 0–37)
Alkaline Phosphatase: 77 U/L (ref 39–117)
BUN: 10 mg/dL (ref 6–23)
Calcium: 9 mg/dL (ref 8.4–10.5)
Chloride: 103 mEq/L (ref 96–112)
Creat: 0.79 mg/dL (ref 0.50–1.10)
Total Bilirubin: 0.5 mg/dL (ref 0.3–1.2)

## 2013-03-27 LAB — CBC
HCT: 44 % (ref 36.0–46.0)
MCH: 33.2 pg (ref 26.0–34.0)
MCHC: 35.5 g/dL (ref 30.0–36.0)
MCV: 93.6 fL (ref 78.0–100.0)
RDW: 13 % (ref 11.5–15.5)

## 2013-03-28 ENCOUNTER — Ambulatory Visit (HOSPITAL_COMMUNITY)
Admission: RE | Admit: 2013-03-28 | Discharge: 2013-03-28 | Disposition: A | Discharge status: Home / Self Care | Payer: PRIVATE HEALTH INSURANCE | Source: Ambulatory Visit | Attending: Internal Medicine | Admitting: Internal Medicine

## 2013-03-28 DIAGNOSIS — R109 Unspecified abdominal pain: Secondary | ICD-10-CM | POA: Insufficient documentation

## 2013-03-28 DIAGNOSIS — R933 Abnormal findings on diagnostic imaging of other parts of digestive tract: Secondary | ICD-10-CM | POA: Insufficient documentation

## 2013-03-28 DIAGNOSIS — I10 Essential (primary) hypertension: Secondary | ICD-10-CM | POA: Insufficient documentation

## 2013-03-28 DIAGNOSIS — R1031 Right lower quadrant pain: Secondary | ICD-10-CM

## 2013-03-28 DIAGNOSIS — K5090 Crohn's disease, unspecified, without complications: Secondary | ICD-10-CM | POA: Insufficient documentation

## 2013-03-28 MED ORDER — IOHEXOL 300 MG/ML  SOLN
100.0000 mL | Freq: Once | INTRAMUSCULAR | Status: AC | PRN
  Administered 2013-03-28: 100 mL via INTRAVENOUS

## 2013-04-02 ENCOUNTER — Other Ambulatory Visit (INDEPENDENT_AMBULATORY_CARE_PROVIDER_SITE_OTHER): Payer: Self-pay | Admitting: Internal Medicine

## 2013-04-09 ENCOUNTER — Other Ambulatory Visit (INDEPENDENT_AMBULATORY_CARE_PROVIDER_SITE_OTHER): Payer: Self-pay | Admitting: Internal Medicine

## 2013-04-10 ENCOUNTER — Encounter (INDEPENDENT_AMBULATORY_CARE_PROVIDER_SITE_OTHER): Payer: Self-pay | Admitting: Internal Medicine

## 2013-04-10 ENCOUNTER — Ambulatory Visit (INDEPENDENT_AMBULATORY_CARE_PROVIDER_SITE_OTHER): Payer: PRIVATE HEALTH INSURANCE | Admitting: Internal Medicine

## 2013-04-10 VITALS — BP 124/72 | HR 78 | Temp 97.8°F | Resp 18 | Ht 67.0 in | Wt 213.6 lb

## 2013-04-10 DIAGNOSIS — K5732 Diverticulitis of large intestine without perforation or abscess without bleeding: Secondary | ICD-10-CM

## 2013-04-10 DIAGNOSIS — K5090 Crohn's disease, unspecified, without complications: Secondary | ICD-10-CM

## 2013-04-10 NOTE — Progress Notes (Signed)
Presenting complaint;  Followup for lower abdominal pain felt to be secondary to sigmoid colon diverticulitis.  Subjective:  Patient is 52 year old Caucasian female who was small bowel Crohn disease and history of diverticulitis. She was seen 2 weeks ago for lower mid abdominal pain and suspected to have diverticulitis. She was begun on antibiotics and she underwent abdominopelvic CT after having been on antibiotics for 2 days. It showed changes suggestive of sigmoid diverticulitis. She says her pain resolved completely within 4-5 days of taking antibiotics and now she has no complaints. She denies nausea vomiting fever or chills. She continues to feel fatigued and tired but able to work full-time and do household work. She generally has 3-4 bowel movements per day usually after each meal. Stools are semi-formed. She denies melena or rectal bleeding. She also complains of insomnia. She is also complaining of nasal congestion. She does not understand why she's not able to lose weight even though she eats very little.  Current Medications: Current Outpatient Prescriptions  Medication Sig Dispense Refill  . b complex vitamins tablet Take 1 tablet by mouth daily.      . ciprofloxacin (CIPRO) 500 MG tablet TAKE ONE TABLET BY MOUTH TWICE DAILY  28 tablet  0  . Hydrocodone-Acetaminophen 5-300 MG TABS Take 1 tablet by mouth 4 (four) times daily as needed.  30 each  0  . lisinopril-hydrochlorothiazide (PRINZIDE,ZESTORETIC) 20-12.5 MG per tablet Take 1 tablet by mouth daily.       . mesalamine (PENTASA) 500 MG CR capsule Take 1,000 mg by mouth 3 (three) times daily.       . metroNIDAZOLE (FLAGYL) 500 MG tablet TAKE ONE TABLET BY MOUTH TWICE DAILY  28 tablet  0  . omeprazole (PRILOSEC) 20 MG capsule Take 2 capsules (40 mg total) by mouth daily.  30 capsule  5  . promethazine (PHENERGAN) 25 MG tablet Take 25 mg by mouth as needed.       No current facility-administered medications for this visit.      Objective: Blood pressure 124/72, pulse 78, temperature 97.8 F (36.6 C), temperature source Oral, resp. rate 18, height 5' 7"  (1.702 m), weight 213 lb 9.6 oz (96.888 kg). Patient is alert and in no acute distress. Conjunctiva is pink. Sclera is nonicteric Oropharyngeal mucosa is normal. No neck masses or thyromegaly noted. Abdomen is full soft and nontender without organomegaly or masses. No LE edema or clubbing noted.  Labs/studies Results: Blood work from 04/10/13. CRP was 2.1 WBC 6.2, H&H 15.6 and 44 and platelet count 238K Comprehensive chemistry panel was within normal li Limits. TSH was 1.420.Marland Kitchen She had CRP at work today result is pending    Assessment:  #1. Sigmoid diverticulitis. She has completely recovered with antibiotic therapy. This is her second episode. She was treated on an outpatient basis for both of these episodes. If she has another episode will consider sigmoid colon resection. #2. Small bowel Crohn disease. She appears to be in remission. CRP may have been elevated secondary to diverticulitis. If so it should be back to normal. #3. Tiredness. This symptom may be secondary to insomnia.  TSH and H&H are normal. She will discuss further workup with Dr. Eula Fried.   Plan:  Stop Cipro and metronidazole after a second dose today. Notify if symptoms relapse. 2 increase Pentasa to 1 g by mouth 4 times a day for the next refill. Office visit in 3 months.

## 2013-04-10 NOTE — Patient Instructions (Signed)
Can stop Cipro and metronidazole after second dose today.

## 2013-04-30 ENCOUNTER — Encounter (INDEPENDENT_AMBULATORY_CARE_PROVIDER_SITE_OTHER): Payer: Self-pay

## 2013-05-17 ENCOUNTER — Telehealth (INDEPENDENT_AMBULATORY_CARE_PROVIDER_SITE_OTHER): Payer: Self-pay | Admitting: *Deleted

## 2013-05-17 NOTE — Telephone Encounter (Signed)
Addressed.

## 2013-05-17 NOTE — Telephone Encounter (Signed)
LM asking Elizabeth [name] to return her call at [phone]. She has a question on a form to be filled out about her medications.

## 2013-05-28 ENCOUNTER — Telehealth (INDEPENDENT_AMBULATORY_CARE_PROVIDER_SITE_OTHER): Payer: Self-pay | Admitting: *Deleted

## 2013-05-28 NOTE — Telephone Encounter (Signed)
Patient was called. She was told that all copies were sent to the [facility] on her behalf and I had called them on Friday  And they verified that they had rec'd the complete application.

## 2013-05-28 NOTE — Telephone Encounter (Signed)
Received a letter from [employer] and apparently she didn't sign something. Would like to  Know if [name] still has the form? She would also like to speak with [name] if she would return the call at [phone].

## 2013-07-10 ENCOUNTER — Ambulatory Visit (INDEPENDENT_AMBULATORY_CARE_PROVIDER_SITE_OTHER): Payer: PRIVATE HEALTH INSURANCE | Admitting: Internal Medicine

## 2013-07-10 ENCOUNTER — Encounter (INDEPENDENT_AMBULATORY_CARE_PROVIDER_SITE_OTHER): Payer: Self-pay | Admitting: Internal Medicine

## 2013-07-10 VITALS — BP 118/70 | HR 76 | Temp 98.2°F | Resp 18 | Ht 67.0 in | Wt 207.0 lb

## 2013-07-10 DIAGNOSIS — K589 Irritable bowel syndrome without diarrhea: Secondary | ICD-10-CM

## 2013-07-10 DIAGNOSIS — K5090 Crohn's disease, unspecified, without complications: Secondary | ICD-10-CM

## 2013-07-10 MED ORDER — DICYCLOMINE HCL 10 MG PO CAPS: 10.0000 mg | ORAL_CAPSULE | Freq: Three times a day (TID) | ORAL | Status: DC

## 2013-07-10 NOTE — Patient Instructions (Addendum)
Call if dicyclomine does not help slow down frequency of your bowel movements. Take calcium with vitamin D  two tablets daily(OTC)

## 2013-07-10 NOTE — Progress Notes (Signed)
Presenting complaint;  Follow for Crohn's disease and GERD.  Subjective:  Patient is 52 year old Caucasian female with history of small bowel Crohn's disease who was last seen in April twice for second episode of diverticulitis. First episode occurred last year. She feels well even though she is having 7-10 bowel movements per day. She has nocturnal bowel movement 2 or 3 times a week. She has no abdominal pain whatsoever. Most of her stools are loose or some of forms. Every now and then she has formed stool. She denies nausea vomiting urgency melena rectal bleeding. Heartlands well controlled with therapy. She has good appetite. She is trying to lose weight and has lost 6 pounds since her last visit. She would like for the record to reflect that she is not allergic to Flagyl.  Current Medications: Current Outpatient Prescriptions  Medication Sig Dispense Refill  . b complex vitamins tablet Take 1 tablet by mouth daily.      Marland Kitchen lisinopril-hydrochlorothiazide (PRINZIDE,ZESTORETIC) 20-12.5 MG per tablet Take 1 tablet by mouth daily.       . mesalamine (PENTASA) 500 MG CR capsule Take 1,000 mg by mouth 3 (three) times daily.       Marland Kitchen omeprazole (PRILOSEC) 20 MG capsule Take 2 capsules (40 mg total) by mouth daily.  30 capsule  5  . promethazine (PHENERGAN) 25 MG tablet Take 25 mg by mouth as needed.       No current facility-administered medications for this visit.     Objective: Blood pressure 118/70, pulse 76, temperature 98.2 F (36.8 C), temperature source Oral, resp. rate 18, height 5' 7"  (1.702 m), weight 207 lb (93.895 kg). Asian is alert and in no acute distress. Conjunctiva is pink. Sclera is nonicteric Oropharyngeal mucosa is normal. No neck masses or thyromegaly noted. Cardiac exam with regular rhythm normal S1 and S2. No murmur or gallop noted. Lungs are clear to auscultation. Abdomen is full but soft and nontender without organomegaly or masses.  No LE edema or clubbing  noted.    Assessment:  #1. Small bowel Crohn's disease. She appears to be in remission. Increased frequency of defecation most likely secondary to IBS and or prior right hemicolectomy. #2. GERD. Symptoms well-controlled with therapy. #3. History of sigmoid diverticulitis. She has suffered 2 episodes but did not require hospitalization.   Plan: Allergies updated; patient not allergic to Flagyl. Continue omeprazole and Pentasa at current dose. Dicyclomine 10 mg by mouth 3 times a day. Patient advised to take calcium vitamin D 2 pills daily. If dicyclomine does not help she will call. Office visit in 6 months.

## 2013-11-13 ENCOUNTER — Other Ambulatory Visit: Payer: Self-pay | Admitting: *Deleted

## 2013-11-13 MED ORDER — LISINOPRIL-HYDROCHLOROTHIAZIDE 20-12.5 MG PO TABS: 1.0000 | ORAL_TABLET | Freq: Every day | ORAL | Status: DC

## 2013-12-13 DIAGNOSIS — J189 Pneumonia, unspecified organism: Secondary | ICD-10-CM

## 2013-12-13 HISTORY — DX: Pneumonia, unspecified organism: J18.9

## 2014-01-07 ENCOUNTER — Encounter (HOSPITAL_COMMUNITY): Payer: Self-pay | Admitting: Emergency Medicine

## 2014-01-07 DIAGNOSIS — F17200 Nicotine dependence, unspecified, uncomplicated: Secondary | ICD-10-CM | POA: Insufficient documentation

## 2014-01-07 DIAGNOSIS — IMO0002 Reserved for concepts with insufficient information to code with codable children: Secondary | ICD-10-CM | POA: Insufficient documentation

## 2014-01-07 DIAGNOSIS — J209 Acute bronchitis, unspecified: Secondary | ICD-10-CM | POA: Insufficient documentation

## 2014-01-07 DIAGNOSIS — J029 Acute pharyngitis, unspecified: Secondary | ICD-10-CM | POA: Insufficient documentation

## 2014-01-07 DIAGNOSIS — K5090 Crohn's disease, unspecified, without complications: Secondary | ICD-10-CM | POA: Insufficient documentation

## 2014-01-07 DIAGNOSIS — I10 Essential (primary) hypertension: Secondary | ICD-10-CM | POA: Insufficient documentation

## 2014-01-07 DIAGNOSIS — Z79899 Other long term (current) drug therapy: Secondary | ICD-10-CM | POA: Insufficient documentation

## 2014-01-07 DIAGNOSIS — Z880 Allergy status to penicillin: Secondary | ICD-10-CM | POA: Insufficient documentation

## 2014-01-07 NOTE — ED Notes (Signed)
Pt c/o cough and sob that started tonight. Pt states her breathing tx at home are not helping.

## 2014-01-08 ENCOUNTER — Emergency Department (HOSPITAL_COMMUNITY): Payer: PRIVATE HEALTH INSURANCE

## 2014-01-08 ENCOUNTER — Emergency Department (HOSPITAL_COMMUNITY)
Admission: EM | Admit: 2014-01-08 | Discharge: 2014-01-08 | Disposition: A | Discharge status: Home / Self Care | Payer: PRIVATE HEALTH INSURANCE | Attending: Emergency Medicine | Admitting: Emergency Medicine

## 2014-01-08 DIAGNOSIS — J40 Bronchitis, not specified as acute or chronic: Secondary | ICD-10-CM

## 2014-01-08 MED ORDER — PREDNISONE 20 MG PO TABS
40.0000 mg | ORAL_TABLET | Freq: Once | ORAL | Status: AC
  Administered 2014-01-08: 40 mg via ORAL
  Filled 2014-01-08: qty 2

## 2014-01-08 MED ORDER — ALBUTEROL SULFATE (2.5 MG/3ML) 0.083% IN NEBU
5.0000 mg | INHALATION_SOLUTION | Freq: Once | RESPIRATORY_TRACT | Status: AC
  Administered 2014-01-08: 5 mg via RESPIRATORY_TRACT
  Filled 2014-01-08: qty 6

## 2014-01-08 MED ORDER — ALBUTEROL SULFATE HFA 108 (90 BASE) MCG/ACT IN AERS: 2.0000 | INHALATION_SPRAY | RESPIRATORY_TRACT | Status: DC | PRN

## 2014-01-08 MED ORDER — AZITHROMYCIN 250 MG PO TABS: 250.0000 mg | ORAL_TABLET | Freq: Every day | ORAL | Status: DC

## 2014-01-08 MED ORDER — IPRATROPIUM BROMIDE 0.02 % IN SOLN
0.5000 mg | Freq: Once | RESPIRATORY_TRACT | Status: AC
  Administered 2014-01-08: 0.5 mg via RESPIRATORY_TRACT
  Filled 2014-01-08: qty 2.5

## 2014-01-08 MED ORDER — PREDNISONE 20 MG PO TABS: 40.0000 mg | ORAL_TABLET | Freq: Every day | ORAL | Status: DC

## 2014-01-08 NOTE — Discharge Instructions (Signed)
There is a possible slight pneumonia -   Albuterol treatment every 4 hours for the next 24 hours, then every 4 hours as needed  zithromax daily for 5 days  Prednisone daily for 5 days.  Please call your doctor for a followup appointment within 24-48 hours. When you talk to your doctor please let them know that you were seen in the emergency department and have them acquire all of your records so that they can discuss the findings with you and formulate a treatment plan to fully care for your new and ongoing problems.

## 2014-01-08 NOTE — ED Provider Notes (Signed)
CSN: [id number]     Arrival date & time 01/07/14  2244 History   First MD Initiated Contact with Patient 01/08/14 0014     Chief Complaint  Patient presents with  . Cough  . Shortness of Breath   (Consider location/radiation/quality/duration/timing/severity/associated sxs/prior Treatment) HPI Comments: 53 year old female, history of Crohn's disease, recurrent bronchitis who is a long-time smoker and presents with a complaint of cough and shortness of breath. This started early this morning, has been persistent throughout the day, gradually worsening and is to the point where she feels as though she cannot lay supine. She has a minimally productive cough, no fevers or chills, no nodule vomiting but has developed a sore throat this evening. She has tried albuterol treatments at home which relieved her symptoms for approximately 15 minutes before they return. She denies swelling of the legs and denies a diagnosis of COPD or emphysema in the past.  Patient is a 53 y.o. female presenting with cough and shortness of breath. The history is provided by the patient.  Cough Associated symptoms: shortness of breath   Shortness of Breath Associated symptoms: cough     Past Medical History  Diagnosis Date  . Crohn's disease   . Elevated transaminase level   . Hypertension    Past Surgical History  Procedure Laterality Date  . Hemocolectomy  2005    right  . Abdominal hysterectomy  1980  . Appendectomy    . Colonoscopy    . Esophagogastroduodenoscopy  06/16/2012    Procedure: ESOPHAGOGASTRODUODENOSCOPY (EGD);  Surgeon: Rogene Houston, MD;  Location: [facility];  Service: Endoscopy;  Laterality: N/A;  10:30 AM   Family History  Problem Relation Age of Onset  . Healthy Mother   . Lung cancer Father   . Lung cancer Brother   . Healthy Daughter    History  Substance Use Topics  . Smoking status: Current Every Day Smoker    Types: Cigarettes    Last Attempt to Quit: 01/03/2012  .  Smokeless tobacco: Never Used  . Alcohol Use: No   OB History   Grav Para Term Preterm Abortions TAB SAB Ect Mult Living                 Review of Systems  Respiratory: Positive for cough and shortness of breath.   All other systems reviewed and are negative.    Allergies  Contrast media and Penicillins  Home Medications   Current Outpatient Rx  Name  Route  Sig  Dispense  Refill  . albuterol (PROVENTIL HFA;VENTOLIN HFA) 108 (90 BASE) MCG/ACT inhaler   Inhalation   Inhale 2 puffs into the lungs every 4 (four) hours as needed for wheezing or shortness of breath.   1 Inhaler   3   . azithromycin (ZITHROMAX Z-PAK) 250 MG tablet   Oral   Take 1 tablet (250 mg total) by mouth daily. 552m PO day 1, then 2570mPO days 205   6 tablet   0   . b complex vitamins tablet   Oral   Take 1 tablet by mouth daily.         . Marland Kitchenicyclomine (BENTYL) 10 MG capsule   Oral   Take 1 capsule (10 mg total) by mouth 3 (three) times daily before meals.   90 capsule   5   . lisinopril-hydrochlorothiazide (PRINZIDE,ZESTORETIC) 20-12.5 MG per tablet   Oral   Take 1 tablet by mouth daily.   30 tablet   6   .  mesalamine (PENTASA) 500 MG CR capsule   Oral   Take 1,000 mg by mouth 3 (three) times daily.          Marland Kitchen omeprazole (PRILOSEC) 20 MG capsule   Oral   Take 2 capsules (40 mg total) by mouth daily.   30 capsule   5   . predniSONE (DELTASONE) 20 MG tablet   Oral   Take 2 tablets (40 mg total) by mouth daily.   10 tablet   0   . promethazine (PHENERGAN) 25 MG tablet   Oral   Take 25 mg by mouth as needed.          BP 119/68  Pulse 64  Temp(Src) 98.1 F (36.7 C) (Oral)  Resp 20  Ht 5' 7"  (1.702 m)  Wt 206 lb (93.441 kg)  BMI 32.26 kg/m2  SpO2 93% Physical Exam  Nursing note and vitals reviewed. Constitutional: She appears well-developed and well-nourished. No distress.  HENT:  Head: Normocephalic and atraumatic.  Mouth/Throat: Oropharynx is clear and moist. No  oropharyngeal exudate.  OP with erythema, but no exudate swelling or hypertrophy  Eyes: Conjunctivae and EOM are normal. Pupils are equal, round, and reactive to light. Right eye exhibits no discharge. Left eye exhibits no discharge. No scleral icterus.  Neck: Normal range of motion. Neck supple. No JVD present. No thyromegaly present.  Cardiovascular: Normal rate, regular rhythm, normal heart sounds and intact distal pulses.  Exam reveals no gallop and no friction rub.   No murmur heard. Pulmonary/Chest: Effort normal. No respiratory distress. She has wheezes. She has no rales.  Speech in full sentences, no increased work of breathing or tachypnea, mild prolonged expiratory phase with decreased lung sounds and slight wheezing.  Abdominal: Soft. Bowel sounds are normal. She exhibits no distension and no mass. There is no tenderness.  Musculoskeletal: Normal range of motion. She exhibits no edema and no tenderness.  Lymphadenopathy:    She has no cervical adenopathy.  Neurological: She is alert. Coordination normal.  Skin: Skin is warm and dry. No rash noted. No erythema.  Psychiatric: She has a normal mood and affect. Her behavior is normal.    ED Course  Procedures (including critical care time) Labs Review Labs Reviewed - No data to display Imaging Review Dg Chest 2 View  01/08/2014   CLINICAL DATA:  All shortness of breath, cough.  EXAM: CHEST  2 VIEW  COMPARISON:  10/19/2013  FINDINGS: Heart size and mediastinal contours within normal range. Mild lung base opacities. Mild central interstitial prominence and peribronchial thickening. No pleural effusion or pneumothorax. Sequelae of prior lateral right rib fractures. No acute osseous finding.  IMPRESSION: Mild lung base opacities; favor atelectasis.  Mild peribronchial thickening, chronic or acute on chronic bronchitis.   Electronically Signed   By: Carlos Levering M.D.   On: 01/08/2014 01:29    EKG Interpretation   None       MDM    1. Bronchitis    The patient has an erythematous pharynx with associated coughing and wheezing, this is likely bronchitis however given the patient's chronic tobacco use and recurrent bronchitis with severe dyspnea when she lay supine of 110 x-ray to rule out other sources of her shortness of breath such as pneumonia or pulmonary edema though this is less likely.  Imaging without evidence of pneumonia, there is some atelectasis which could be consistent with a pneumonia, the patient does not have a fever and is not hypoxic. See medications as below,  patient informed of her results.    Meds given in ED:  Medications  predniSONE (DELTASONE) tablet 40 mg (40 mg Oral Given 01/08/14 0025)  albuterol (PROVENTIL) (2.5 MG/3ML) 0.083% nebulizer solution 5 mg (5 mg Nebulization Given 01/08/14 0029)  ipratropium (ATROVENT) nebulizer solution 0.5 mg (0.5 mg Nebulization Given 01/08/14 0029)    New Prescriptions   ALBUTEROL (PROVENTIL HFA;VENTOLIN HFA) 108 (90 BASE) MCG/ACT INHALER    Inhale 2 puffs into the lungs every 4 (four) hours as needed for wheezing or shortness of breath.   AZITHROMYCIN (ZITHROMAX Z-PAK) 250 MG TABLET    Take 1 tablet (250 mg total) by mouth daily. 537m PO day 1, then 2558mPO days 205   PREDNISONE (DELTASONE) 20 MG TABLET    Take 2 tablets (40 mg total) by mouth daily.        BrJohnna AcostaMD 01/08/14 [phone]

## 2014-01-08 NOTE — ED Notes (Signed)
MD at bedside. 

## 2014-01-15 ENCOUNTER — Ambulatory Visit (INDEPENDENT_AMBULATORY_CARE_PROVIDER_SITE_OTHER): Payer: PRIVATE HEALTH INSURANCE | Admitting: Internal Medicine

## 2014-01-21 ENCOUNTER — Emergency Department (HOSPITAL_COMMUNITY)
Admission: EM | Admit: 2014-01-21 | Discharge: 2014-01-21 | Disposition: A | Discharge status: Home / Self Care | Payer: PRIVATE HEALTH INSURANCE | Attending: Emergency Medicine | Admitting: Emergency Medicine

## 2014-01-21 ENCOUNTER — Encounter (HOSPITAL_COMMUNITY): Payer: Self-pay | Admitting: Emergency Medicine

## 2014-01-21 ENCOUNTER — Emergency Department (HOSPITAL_COMMUNITY): Payer: PRIVATE HEALTH INSURANCE

## 2014-01-21 DIAGNOSIS — Z880 Allergy status to penicillin: Secondary | ICD-10-CM | POA: Insufficient documentation

## 2014-01-21 DIAGNOSIS — E876 Hypokalemia: Secondary | ICD-10-CM | POA: Insufficient documentation

## 2014-01-21 DIAGNOSIS — K5090 Crohn's disease, unspecified, without complications: Secondary | ICD-10-CM | POA: Insufficient documentation

## 2014-01-21 DIAGNOSIS — Z79899 Other long term (current) drug therapy: Secondary | ICD-10-CM | POA: Insufficient documentation

## 2014-01-21 DIAGNOSIS — I10 Essential (primary) hypertension: Secondary | ICD-10-CM | POA: Insufficient documentation

## 2014-01-21 DIAGNOSIS — F17200 Nicotine dependence, unspecified, uncomplicated: Secondary | ICD-10-CM | POA: Insufficient documentation

## 2014-01-21 DIAGNOSIS — IMO0002 Reserved for concepts with insufficient information to code with codable children: Secondary | ICD-10-CM | POA: Insufficient documentation

## 2014-01-21 DIAGNOSIS — J40 Bronchitis, not specified as acute or chronic: Secondary | ICD-10-CM | POA: Insufficient documentation

## 2014-01-21 HISTORY — DX: Bronchitis, not specified as acute or chronic: J40

## 2014-01-21 LAB — CBC WITH DIFFERENTIAL/PLATELET
BASOS ABS: 0 10*3/uL (ref 0.0–0.1)
Basophils Relative: 0 % (ref 0–1)
Eosinophils Absolute: 0.1 10*3/uL (ref 0.0–0.7)
Eosinophils Relative: 1 % (ref 0–5)
HEMATOCRIT: 41.7 % (ref 36.0–46.0)
HEMOGLOBIN: 14.3 g/dL (ref 12.0–15.0)
LYMPHS ABS: 1.2 10*3/uL (ref 0.7–4.0)
LYMPHS PCT: 13 % (ref 12–46)
MCH: 33.6 pg (ref 26.0–34.0)
MCHC: 34.3 g/dL (ref 30.0–36.0)
MCV: 98.1 fL (ref 78.0–100.0)
MONO ABS: 0.5 10*3/uL (ref 0.1–1.0)
MONOS PCT: 5 % (ref 3–12)
NEUTROS ABS: 7.1 10*3/uL (ref 1.7–7.7)
Neutrophils Relative %: 80 % — ABNORMAL HIGH (ref 43–77)
Platelets: 166 10*3/uL (ref 150–400)
RBC: 4.25 MIL/uL (ref 3.87–5.11)
RDW: 13.1 % (ref 11.5–15.5)
WBC: 8.9 10*3/uL (ref 4.0–10.5)

## 2014-01-21 LAB — BASIC METABOLIC PANEL
BUN: 11 mg/dL (ref 6–23)
CHLORIDE: 94 meq/L — AB (ref 96–112)
CO2: 28 meq/L (ref 19–32)
Calcium: 8.4 mg/dL (ref 8.4–10.5)
Creatinine, Ser: 0.73 mg/dL (ref 0.50–1.10)
GFR calc non Af Amer: >90 mL/min (ref >90)
Glucose, Bld: 97 mg/dL (ref 70–99)
POTASSIUM: 2.7 meq/L — AB (ref 3.7–5.3)
Sodium: 137 mEq/L (ref 137–147)

## 2014-01-21 MED ORDER — IPRATROPIUM BROMIDE 0.02 % IN SOLN: 0.5000 mg | Freq: Once | RESPIRATORY_TRACT | Status: DC

## 2014-01-21 MED ORDER — ALBUTEROL (5 MG/ML) CONTINUOUS INHALATION SOLN
10.0000 mg/h | INHALATION_SOLUTION | Freq: Once | RESPIRATORY_TRACT | Status: AC
  Administered 2014-01-21: 10 mg/h via RESPIRATORY_TRACT
  Filled 2014-01-21: qty 20

## 2014-01-21 MED ORDER — IPRATROPIUM-ALBUTEROL 0.5-2.5 (3) MG/3ML IN SOLN
3.0000 mL | Freq: Once | RESPIRATORY_TRACT | Status: AC
  Administered 2014-01-21: 3 mL via RESPIRATORY_TRACT
  Filled 2014-01-21: qty 3

## 2014-01-21 MED ORDER — POTASSIUM CHLORIDE CRYS ER 20 MEQ PO TBCR: 20.0000 meq | EXTENDED_RELEASE_TABLET | Freq: Every day | ORAL | Status: DC

## 2014-01-21 MED ORDER — PREDNISONE 50 MG PO TABS
60.0000 mg | ORAL_TABLET | Freq: Once | ORAL | Status: AC
  Administered 2014-01-21: 60 mg via ORAL
  Filled 2014-01-21 (×2): qty 1

## 2014-01-21 MED ORDER — ALBUTEROL SULFATE (2.5 MG/3ML) 0.083% IN NEBU: 5.0000 mg | INHALATION_SOLUTION | Freq: Once | RESPIRATORY_TRACT | Status: DC

## 2014-01-21 MED ORDER — PREDNISONE 20 MG PO TABS: ORAL_TABLET | ORAL | Status: DC

## 2014-01-21 MED ORDER — SODIUM CHLORIDE 0.9 % IV BOLUS (SEPSIS)
1000.0000 mL | Freq: Once | INTRAVENOUS | Status: AC
  Administered 2014-01-21: 1000 mL via INTRAVENOUS

## 2014-01-21 MED ORDER — ALBUTEROL SULFATE (2.5 MG/3ML) 0.083% IN NEBU
2.5000 mg | INHALATION_SOLUTION | Freq: Once | RESPIRATORY_TRACT | Status: AC
  Administered 2014-01-21: 2.5 mg via RESPIRATORY_TRACT
  Filled 2014-01-21: qty 3

## 2014-01-21 MED ORDER — ACETAMINOPHEN 325 MG PO TABS
650.0000 mg | ORAL_TABLET | Freq: Once | ORAL | Status: AC
  Administered 2014-01-21: 650 mg via ORAL
  Filled 2014-01-21: qty 2

## 2014-01-21 MED ORDER — ALBUTEROL SULFATE (2.5 MG/3ML) 0.083% IN NEBU: 2.5000 mg | INHALATION_SOLUTION | RESPIRATORY_TRACT | Status: DC | PRN

## 2014-01-21 MED ORDER — POTASSIUM CHLORIDE CRYS ER 20 MEQ PO TBCR
40.0000 meq | EXTENDED_RELEASE_TABLET | Freq: Once | ORAL | Status: AC
  Administered 2014-01-21: 40 meq via ORAL
  Filled 2014-01-21: qty 2

## 2014-01-21 NOTE — ED Provider Notes (Signed)
CSN: [id number]     Arrival date & time 01/21/14  1159 History   First MD Initiated Contact with Patient 01/21/14 1335     Chief Complaint  Patient presents with  . Shortness of Breath      Patient is a 53 y.o. female presenting with cough. The history is provided by the patient.  Cough Severity:  Moderate Onset quality:  Gradual Duration:  1 day Timing:  Intermittent Progression:  Worsening Chronicity:  Recurrent Smoker: yes   Worsened by:  Nothing tried Ineffective treatments:  None tried Associated symptoms: chills, shortness of breath, sinus congestion and wheezing   pt reports cough/congestion and SOB that worsened in past day She had similar presentation last month, received prednisone/antibiotics with some improvement her symptoms returned last night.  She denies CP.  She reports chronic diarrhea from her crohns disease She is a smoker but recently quit  Past Medical History  Diagnosis Date  . Crohn's disease   . Elevated transaminase level   . Hypertension   . Bronchitis    Past Surgical History  Procedure Laterality Date  . Hemocolectomy  2005    right  . Abdominal hysterectomy  1980  . Appendectomy    . Colonoscopy    . Esophagogastroduodenoscopy  06/16/2012    Procedure: ESOPHAGOGASTRODUODENOSCOPY (EGD);  Surgeon: Rogene Houston, MD;  Location: [facility];  Service: Endoscopy;  Laterality: N/A;  10:30 AM   Family History  Problem Relation Age of Onset  . Healthy Mother   . Lung cancer Father   . Lung cancer Brother   . Healthy Daughter    History  Substance Use Topics  . Smoking status: Current Every Day Smoker -- 0.50 packs/day for 40 years    Types: Cigarettes    Last Attempt to Quit: 01/03/2012  . Smokeless tobacco: Never Used  . Alcohol Use: No   OB History   Grav Para Term Preterm Abortions TAB SAB Ect Mult Living   1 1 1       1      Review of Systems  Constitutional: Positive for chills and fatigue.  Respiratory: Positive for cough,  shortness of breath and wheezing.   Gastrointestinal: Negative for vomiting and abdominal pain.  All other systems reviewed and are negative.      Allergies  Contrast media and Penicillins  Home Medications   Current Outpatient Rx  Name  Route  Sig  Dispense  Refill  . Aclidinium Bromide (TUDORZA PRESSAIR) 400 MCG/ACT AEPB   Inhalation   Inhale 1 puff into the lungs daily.         Marland Kitchen albuterol (PROVENTIL HFA;VENTOLIN HFA) 108 (90 BASE) MCG/ACT inhaler   Inhalation   Inhale 2 puffs into the lungs every 4 (four) hours as needed for wheezing or shortness of breath.   1 Inhaler   3   . b complex vitamins tablet   Oral   Take 1 tablet by mouth daily.         Marland Kitchen doxycycline (VIBRA-TABS) 100 MG tablet   Oral   Take 100 mg by mouth 2 (two) times daily.         Marland Kitchen lisinopril-hydrochlorothiazide (PRINZIDE,ZESTORETIC) 20-12.5 MG per tablet   Oral   Take 1 tablet by mouth daily.   30 tablet   6   . mesalamine (PENTASA) 500 MG CR capsule   Oral   Take 1,000 mg by mouth 3 (three) times daily.          Marland Kitchen  omeprazole (PRILOSEC) 20 MG capsule   Oral   Take 2 capsules (40 mg total) by mouth daily.   30 capsule   5   . predniSONE (DELTASONE) 20 MG tablet   Oral   Take 10 mg by mouth daily.         Marland Kitchen Umeclidinium-Vilanterol (ANORO ELLIPTA) 62.5-25 MCG/INH AEPB   Inhalation   Inhale 1 puff into the lungs 2 (two) times daily.         Marland Kitchen azithromycin (ZITHROMAX Z-PAK) 250 MG tablet   Oral   Take 1 tablet (250 mg total) by mouth daily. 515m PO day 1, then 2596mPO days 205   6 tablet   0    BP 100/75  Pulse 86  Temp(Src) 98.7 F (37.1 C) (Oral)  Resp 26  Ht 5' 7"  (1.702 m)  Wt 217 lb (98.431 kg)  BMI 33.98 kg/m2  SpO2 92% Physical Exam CONSTITUTIONAL: Well developed/well nourished HEAD: Normocephalic/atraumatic EYES: EOMI/PERRL ENMT: Mucous membranes moist, nasal congestion noted NECK: supple no meningeal signs SPINE:entire spine nontender CV: S1/S2  noted, no murmurs/rubs/gallops noted LUNGS: wheezing bilaterally, tachypnea noted.   ABDOMEN: soft, nontender, no rebound or guarding GU:no cva tenderness NEURO: Pt is awake/alert, moves all extremitiesx4 EXTREMITIES: pulses normal, full ROM, no LE edema noted SKIN: warm, color normal PSYCH: no abnormalities of mood noted  ED Course  Procedures (including critical care time) 4:06 PM Pt noted to be hypokalemia She is also still with wheeze and mild hypoxia despite nebs, will give repeat neb and reassess 6:34 PM Pt given multiple treatments with nebs and she is improved She ambulated in the ED and was in no distress and she wants to go home Her wheeze improved RA pulse ox at 95% after ambulating She was noted to be hypokalemic.  She was given K supplements (she is on HCTZ at home) I suspect she has a new viral infection causing cough/congestion as her CXR is clear I doubt ACS/PE/CHF at this time I advised her to see her PCP this week prior to return to work Also, she was given longer course of prednisone as she has been on higher doses since last month and was just tapering it down at home.  2 week taper given.   We discussed strict return precautions Stable for d/c home  Labs Review Labs Reviewed  BASIC METABOLIC PANEL - Abnormal; Notable for the following:    Potassium 2.7 (*)    Chloride 94 (*)    All other components within normal limits  CBC WITH DIFFERENTIAL - Abnormal; Notable for the following:    Neutrophils Relative % 80 (*)    All other components within normal limits   Imaging Review Dg Chest 2 View  01/21/2014   CLINICAL DATA:  Upper breath. Cough.  EXAM: CHEST  2 VIEW  COMPARISON:  01/08/2014  FINDINGS: Cardiac silhouette is normal in size. Normal mediastinal and hilar contours. Mild lung base opacity is likely atelectasis, scarring or a combination. Is stable. Lungs are otherwise clear. No pleural effusion. No pneumothorax.  The bony thorax is intact.  IMPRESSION:  No acute cardiopulmonary disease.   Electronically Signed   By: DaLajean Manes.D.   On: 01/21/2014 13:12    EKG Interpretation    Date/Time:  Monday January 21 2014 15:01:55 EST Ventricular Rate:  84 PR Interval:  128 QRS Duration: 94 QT Interval:  380 QTC Calculation: 449 R Axis:   -4 Text Interpretation:  Normal sinus rhythm RSR' or  QR pattern in V1 suggests right ventricular conduction delay Cannot rule out Inferior infarct , age undetermined Abnormal ECG When compared with ECG of 21-Jan-2014 12:08, Premature ventricular complexes are no longer Present Minimal criteria for Inferior infarct are now Present Confirmed by Christy Gentles  MD, [place] [id number]) on 01/21/2014 6:29:14 PM            MDM   Final diagnoses:  None    Nursing notes including past medical history and social history reviewed and considered in documentation xrays reviewed and considered Labs/vital reviewed and considered Previous records reviewed and considered    Date: 01/21/2014  Rate: 84  Rhythm: normal sinus rhythm  QRS Axis: normal  Intervals: normal  ST/T Wave abnormalities: nonspecific ST changes  Conduction Disutrbances:none  Narrative Interpretation:   Old EKG Reviewed: none available    Sharyon Cable, MD 01/21/14 [phone]

## 2014-01-21 NOTE — ED Notes (Signed)
Patient c/o shortness of breath. Per patient was seen here on 01/07/2014 and diagnosed with bronchitis with possible lower lobe pneumonia. Patient given a z-pack here and has been seen at PCP and given Levaquin, prednisone, duenebs, mucinex, inhaler x3, and  Doxycycline. Patient denies any improvement and states that shortness of breath is worse. Patient reports fevers. Congested cough noted. Denies any chest pain.

## 2014-01-21 NOTE — Discharge Instructions (Signed)
Bronchitis °Bronchitis is swelling (inflammation) of the air tubes leading to your lungs (bronchi). This causes mucus and a cough. If the swelling gets bad, you may have trouble breathing. °HOME CARE  °· Rest. °· Drink enough fluids to keep your pee (urine) clear or pale yellow (unless you have a condition where you have to watch how much you drink). °· Only take medicine as told by your doctor. If you were given antibiotic medicines, finish them even if you start to feel better. °· Avoid smoke, irritating chemicals, and strong smells. These make the problem worse. Quit smoking if you smoke. This helps your lungs heal faster. °· Use a cool mist humidifier. Change the water in the humidifier every day. You can also sit in the bathroom with hot shower running for 5 10 minutes. Keep the door closed. °· See your health care provider as told. °· Wash your hands often. °GET HELP IF: °Your problems do not get better after 1 week. °GET HELP RIGHT AWAY IF:  °· Your fever gets worse. °· You have chills. °· Your chest hurts. °· Your problems breathing get worse. °· You have blood in your mucus. °· You pass out (faint). °· You feel lightheaded. °· You have a bad headache. °· You throw up (vomit) again and again. °MAKE SURE YOU: °· Understand these instructions. °· Will watch your condition. °· Will get help right away if you are not doing well or get worse. °Document Released: 05/17/2008 Document Revised: 09/19/2013 Document Reviewed: 07/24/2013 °[employer]® Patient Information ©2014 [employer]. ° °

## 2014-01-21 NOTE — ED Notes (Signed)
Patient given discharge instruction, verbalized understand. IV removed, band aid applied. Patient ambulatory out of the department.

## 2014-01-21 NOTE — ED Notes (Signed)
Ambulated patient around the nurses desk. No issues. JGW

## 2014-01-23 ENCOUNTER — Institutional Professional Consult (permissible substitution): Payer: PRIVATE HEALTH INSURANCE | Admitting: Internal Medicine

## 2014-01-24 ENCOUNTER — Ambulatory Visit: Payer: PRIVATE HEALTH INSURANCE | Admitting: Cardiology

## 2014-01-28 ENCOUNTER — Encounter: Payer: Self-pay | Admitting: Internal Medicine

## 2014-01-28 ENCOUNTER — Ambulatory Visit (INDEPENDENT_AMBULATORY_CARE_PROVIDER_SITE_OTHER): Payer: PRIVATE HEALTH INSURANCE | Admitting: Internal Medicine

## 2014-01-28 VITALS — BP 112/80 | HR 68 | Temp 97.4°F | Ht 67.0 in | Wt 210.4 lb

## 2014-01-28 DIAGNOSIS — R059 Cough, unspecified: Secondary | ICD-10-CM

## 2014-01-28 DIAGNOSIS — R058 Other specified cough: Secondary | ICD-10-CM | POA: Insufficient documentation

## 2014-01-28 DIAGNOSIS — I10 Essential (primary) hypertension: Secondary | ICD-10-CM

## 2014-01-28 DIAGNOSIS — R05 Cough: Secondary | ICD-10-CM

## 2014-01-28 HISTORY — DX: Cough, unspecified: R05.9

## 2014-01-28 MED ORDER — VALSARTAN-HYDROCHLOROTHIAZIDE 160-12.5 MG PO TABS: 1.0000 | ORAL_TABLET | Freq: Every day | ORAL | Status: DC

## 2014-01-28 MED ORDER — TRAMADOL HCL 50 MG PO TABS: ORAL_TABLET | ORAL | Status: DC

## 2014-01-28 MED ORDER — FAMOTIDINE 20 MG PO TABS: ORAL_TABLET | ORAL | Status: DC

## 2014-01-28 MED ORDER — PREDNISONE 10 MG PO TABS: ORAL_TABLET | ORAL | Status: DC

## 2014-01-28 NOTE — Assessment & Plan Note (Signed)
ACE inhibitors are problematic in  pts with airway complaints because  even experienced pulmonologists can't always distinguish ace effects from copd/asthma.  By themselves they don't actually cause a problem, much like oxygen can't by itself start a fire, but they certainly serve as a powerful catalyst or enhancer for any "fire"  or inflammatory process in the upper airway, be it caused by an ET  tube or more commonly reflux (especially in the obese or pts with known GERD or who are on biphoshonates).    In the era of ARB near equivalency until we have a better handle on the reversibility of the airway problem, it just makes sense to avoid ACEI  entirely in the short run and then decide later, having established a level of airway control using a reasonable limited regimen, whether to add back ace but even then being very careful to observe the pt for worsening airway control and number of meds used/ needed to control symptoms.   rec diovan 160/12.5 one daily instead of acei x 4 weeks then regroup  See instructions for specific recommendations which were reviewed directly with the patient who was given a copy with highlighter outlining the key components.

## 2014-01-28 NOTE — Progress Notes (Signed)
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 around the age of 63 spring and fall referred by Dr Elizabeth [name] 01/28/2014 to pulmonary clinic for refractory cough since Jan 2015   01/28/2014 1st [facility] office visit/ Elizabeth [name] smoker on ACEi  cc persistent severe cough acute onset Jan 08 2014 and since then 4 more encounters = 2 er visis/ 4 doctor visits and rx with tudorza/saba/ neb saba which helped the most and unable to work > very little mucus/ what she does cough up doesn't have much color but tastes bitter and abd pain from coughing so hard 24/7 assoc with sob mostly when coughing and sore throat despite ppi daily   No obvious day to day or daytime variabilty or assoc chronic cough or cp or chest tightness, subjective wheeze overt sinus  symptoms. No unusual exp hx or h/o childhood pna/ asthma or knowledge of premature birth.   Also denies any obvious fluctuation of symptoms with weather or environmental changes or other aggravating or alleviating factors except as outlined above   Current Medications, Allergies, Complete Past Medical History, Past Surgical History, Family History, and Social History were reviewed in [employer] record.  ROS  The following are not active complaints unless bolded sore throat, dysphagia, dental problems, itching, sneezing,  nasal congestion or excess/ purulent secretions, ear ache,   fever, chills, sweats, unintended wt loss, pleuritic or exertional cp, hemoptysis,  orthopnea pnd or leg swelling, presyncope, palpitations, heartburn, abdominal pain, anorexia, nausea, vomiting, diarrhea  or change in bowel or urinary habits, change in stools or urine, dysuria,hematuria,  rash, arthralgias, visual complaints, headache, numbness weakness or ataxia or problems with walking or coordination,  change in mood/affect or memory.       Review  of Systems  Constitutional: Negative for fever, chills and unexpected weight change.  HENT: Positive for postnasal drip and sore throat. Negative for congestion, dental problem, ear pain, nosebleeds, rhinorrhea, sinus pressure, sneezing, trouble swallowing and voice change.   Eyes: Negative for visual disturbance.  Respiratory: Positive for cough and shortness of breath. Negative for choking.   Cardiovascular: Negative for chest pain and leg swelling.  Gastrointestinal: Negative for vomiting, abdominal pain and diarrhea.  Genitourinary: Negative for difficulty urinating.  Musculoskeletal: Negative for arthralgias.  Skin: Negative for rash.  Neurological: Positive for headaches. Negative for tremors and syncope.  Hematological: Does not bruise/bleed easily.       Objective:   Physical Exam  amb hoarse wf nad  Wt Readings from Last 3 Encounters:  01/28/14 210 lb 6.4 oz (95.437 kg)  01/21/14 217 lb (98.431 kg)  01/07/14 206 lb (93.441 kg)      HEENT: nl dentition, turbinates, and orophanx. Nl external ear canals without cough reflex   NECK :  without JVD/Nodes/TM/ nl carotid upstrokes bilaterally   LUNGS: no acc muscle use,  Prominent pseudowheeze mostly resolves with purse lip    CV:  RRR  no s3 or murmur or increase in P2, no edema   ABD:  soft and nontender with nl excursion in the supine position. No bruits or organomegaly, bowel sounds nl  MS:  warm without deformities, calf tenderness, cyanosis or clubbing  SKIN: warm and dry without lesions    NEURO:  alert, approp, no deficits      01/21/14 cxr  No acute cardiopulmonary disease  Assessment & Plan:

## 2014-01-28 NOTE — Patient Instructions (Addendum)
The key to effective treatment for your cough is eliminating the non-stop cycle of cough you're stuck in long enough to let your airway heal completely and then see if there is anything still making you cough once you stop the cough suppression, but this should take no more than 5 days to figure out  First take delsym two tsp every 12 hours and supplement if needed with  tramadol 50 mg up to 2 every 4 hours to suppress the urge to cough at all or even clear your throat. Swallowing water or using ice chips/non mint and menthol containing candies (such as lifesavers or sugarless jolly ranchers) are also effective.  You should rest your voice and avoid activities that you know make you cough.  Once you have eliminated the cough for 3 straight days try reducing the tramadol first,  then the delsym as tolerated.    Try prilosec 72m  Take x 2  30-60 min before first meal of the day and Pepcid 20 mg one bedtime until cough is completely gone for at least a week without the need for cough suppression  Stop lisinopril and start Diovan 160 /12.5 one daily in its place   Prednisone 10 mg take  4 each am x 2 days,   2 each am x 2 days,  1 each am x 2 days and stop   GERD (REFLUX)  is an extremely common cause of respiratory symptoms, many times with no significant heartburn at all.    It can be treated with medication, but also with lifestyle changes including avoidance of late meals, excessive alcohol, smoking cessation, and avoid fatty foods, chocolate, peppermint, colas, red wine, and acidic juices such as orange juice.  NO MINT OR MENTHOL PRODUCTS SO NO COUGH DROPS  USE SUGARLESS CANDY INSTEAD (jolley ranchers or Stover's)  NO OIL BASED VITAMINS - use powdered substitutes.  Please schedule a follow up office visit in 4 weeks, sooner if needed with pfts

## 2014-01-28 NOTE — Assessment & Plan Note (Addendum)
The most common causes of chronic cough in immunocompetent adults include the following: upper airway cough syndrome (UACS), previously referred to as postnasal drip syndrome (PNDS), which is caused by variety of rhinosinus conditions; (2) asthma; (3) GERD; (4) chronic bronchitis from cigarette smoking or other inhaled environmental irritants; (5) nonasthmatic eosinophilic bronchitis; and (6) bronchiectasis.   These conditions, singly or in combination, have accounted for up to 94% of the causes of chronic cough in prospective studies.   Other conditions have constituted no >6% of the causes in prospective studies These have included bronchogenic carcinoma, chronic interstitial pneumonia, sarcoidosis, left ventricular failure, ACEI-induced cough, and aspiration from a condition associated with pharyngeal dysfunction.    Chronic cough is often simultaneously caused by more than one condition. A single cause has been found from 38 to 82% of the time, multiple causes from 18 to 62%. Multiply caused cough has been the result of three diseases up to 42% of the time.   Most likely this is  Classic Upper airway cough syndrome, so named because it's frequently impossible to sort out how much is  CR/sinusitis with freq throat clearing (which can be related to primary GERD)   vs  causing  secondary (" extra esophageal")  GERD from wide swings in gastric pressure that occur with throat clearing, often  promoting self use of mint and menthol lozenges that reduce the lower esophageal sphincter tone and exacerbate the problem further in a cyclical fashion.   These are the same pts (now being labeled as having "irritable larynx syndrome" by some cough centers) who not infrequently have a history of having failed to tolerate ace inhibitors,  dry powder inhalers or biphosphonates or report having atypical reflux symptoms that don't respond to standard doses of PPI , and are easily confused as having aecopd or asthma flares  by even experienced allergists/ pulmonologists.   For now needs elimination of cyclical cough/acid reflux  and elimination of ACEi x 4 weeks then regroup with pfts

## 2014-02-08 ENCOUNTER — Encounter (INDEPENDENT_AMBULATORY_CARE_PROVIDER_SITE_OTHER): Payer: Self-pay | Admitting: *Deleted

## 2014-02-08 ENCOUNTER — Encounter (INDEPENDENT_AMBULATORY_CARE_PROVIDER_SITE_OTHER): Payer: Self-pay | Admitting: Internal Medicine

## 2014-02-08 ENCOUNTER — Ambulatory Visit (INDEPENDENT_AMBULATORY_CARE_PROVIDER_SITE_OTHER): Payer: PRIVATE HEALTH INSURANCE | Admitting: Internal Medicine

## 2014-02-08 VITALS — BP 108/74 | HR 80 | Temp 97.8°F | Ht 67.0 in | Wt 211.9 lb

## 2014-02-08 DIAGNOSIS — K219 Gastro-esophageal reflux disease without esophagitis: Secondary | ICD-10-CM

## 2014-02-08 DIAGNOSIS — K5090 Crohn's disease, unspecified, without complications: Secondary | ICD-10-CM

## 2014-02-08 LAB — CBC WITH DIFFERENTIAL/PLATELET
BASOS ABS: 0 10*3/uL (ref 0.0–0.1)
Basophils Relative: 0 % (ref 0–1)
EOS PCT: 0 % (ref 0–5)
Eosinophils Absolute: 0 10*3/uL (ref 0.0–0.7)
HEMATOCRIT: 42.3 % (ref 36.0–46.0)
Hemoglobin: 15 g/dL (ref 12.0–15.0)
LYMPHS ABS: 2.1 10*3/uL (ref 0.7–4.0)
LYMPHS PCT: 27 % (ref 12–46)
MCH: 33.9 pg (ref 26.0–34.0)
MCHC: 35.5 g/dL (ref 30.0–36.0)
MCV: 95.5 fL (ref 78.0–100.0)
Monocytes Absolute: 0.5 10*3/uL (ref 0.1–1.0)
Monocytes Relative: 7 % (ref 3–12)
NEUTROS ABS: 5.1 10*3/uL (ref 1.7–7.7)
Neutrophils Relative %: 66 % (ref 43–77)
Platelets: 215 10*3/uL (ref 150–400)
RBC: 4.43 MIL/uL (ref 3.87–5.11)
RDW: 13.1 % (ref 11.5–15.5)
WBC: 7.8 10*3/uL (ref 4.0–10.5)

## 2014-02-08 LAB — HEPATIC FUNCTION PANEL
ALT: 22 U/L (ref 0–35)
AST: 12 U/L (ref 0–37)
Albumin: 3.8 g/dL (ref 3.5–5.2)
Alkaline Phosphatase: 61 U/L (ref 39–117)
BILIRUBIN DIRECT: 0.1 mg/dL (ref 0.0–0.3)
BILIRUBIN INDIRECT: 0.5 mg/dL (ref 0.2–1.2)
BILIRUBIN TOTAL: 0.6 mg/dL (ref 0.2–1.2)
Total Protein: 6 g/dL (ref 6.0–8.3)

## 2014-02-08 LAB — C-REACTIVE PROTEIN: CRP: <0.5 mg/dL (ref <0.60)

## 2014-02-08 NOTE — Progress Notes (Signed)
Subjective:     Patient ID: Elizabeth [name], female   DOB: [birth year], 53 y.o.   MRN: [id number]  HPI For about a week, she has upper abdominal pain. The pain is worse at night.  She says the pain is not related to food.' She ha frequent belching. She has been on several antibiotics over the past months. She has been on 3 different antibiotics over the past months for bronchitis.  Presently taking Prednisone 60m taper for her bronchitis. She was started on 839mby Dr WeMelvyn NovasShe says when she eats the food will go straight thru which she says is normal for her.  She says the gas is worse at night. Yesterday she had a black stool.l She has been taking Pepto BIsmol.   Hx of small bowel Crohn's disease. [facility]2012 [id number] Small sliding hiatal hernia was single erosion at GE junction otherwise normal esophagogastroduodenoscopy   Review of Systems Past Medical History  Diagnosis Date  . Crohn's disease   . Elevated transaminase level   . Hypertension   . Bronchitis   . Cough 01/28/2014    Past Surgical History  Procedure Laterality Date  . Hemocolectomy  2005    right  . Abdominal hysterectomy  1980  . Appendectomy    . Colonoscopy    . Esophagogastroduodenoscopy  06/16/2012    Procedure: ESOPHAGOGASTRODUODENOSCOPY (EGD);  Surgeon: NaRogene HoustonMD;  Location: [facility];  Service: Endoscopy;  Laterality: N/A;  10:30 AM    Allergies  Allergen Reactions  . Contrast Media [Iodinated Diagnostic Agents] Hives and Rash  . Penicillins Hives and Rash    Current Outpatient Prescriptions on File Prior to Visit  Medication Sig Dispense Refill  . albuterol (PROVENTIL) (2.5 MG/3ML) 0.083% nebulizer solution Take 3 mLs (2.5 mg total) by nebulization every 4 (four) hours as needed for wheezing or shortness of breath.  30 vial  0  . mesalamine (PENTASA) 500 MG CR capsule Take 1,000 mg by mouth 3 (three) times daily.       . Marland Kitchenmeprazole (PRILOSEC)  20 MG capsule Take 2 capsules (40 mg total) by mouth daily.  30 capsule  5  . potassium chloride SA (K-DUR,KLOR-CON) 20 MEQ tablet Take 1 tablet (20 mEq total) by mouth daily.  7 tablet  0  . valsartan-hydrochlorothiazide (DIOVAN HCT) 160-12.5 MG per tablet Take 1 tablet by mouth daily.  30 tablet  11   No current facility-administered medications on file prior to visit.        Objective:   Physical Exam  Filed Vitals:   02/08/14 0924  BP: 108/74  Pulse: 80  Temp: 97.8 F (36.6 C)  Height: 5' 7"  (1.702 m)  Weight: 211 lb 14.4 oz (96.117 kg)  Alert and oriented. Skin warm and dry. Oral mucosa is moist.   . Sclera anicteric, conjunctivae is pink. Thyroid not enlarged. No cervical lymphadenopathy. Lungs clear. Heart regular rate and rhythm.  Abdomen is soft. Bowel sounds are positive. No hepatomegaly. No abdominal masses felt. No tenderness.  No edema to lower extremities.        Assessment:    Bloating. Belching. She has been on recent antibiotics for bronchitis.  She has diarrhea which she says is normal for her. Would however like to rule out C-diff. Crohn's disease: Appears stable at this time.     Plan:     CBC, CRP, Hepatic profile, GI pathogen. Samples of Dexilant given to patient.  Stop the Prilosec for now and try the [place].

## 2014-02-08 NOTE — Patient Instructions (Signed)
CBC, Hepatic profile, CRP. Dexilant Samples

## 2014-02-27 ENCOUNTER — Encounter (INDEPENDENT_AMBULATORY_CARE_PROVIDER_SITE_OTHER): Payer: Self-pay | Admitting: *Deleted

## 2014-02-27 NOTE — Telephone Encounter (Signed)
Yanisa - said terri was told it was out for the day - 6i:30 to 3 pm =        [phone] - Yana axsome please give her a call at work and have her paged meeting until 9:30 very important

## 2014-02-27 NOTE — Telephone Encounter (Signed)
This encounter was created in error - please disregard.

## 2014-03-08 ENCOUNTER — Ambulatory Visit: Payer: PRIVATE HEALTH INSURANCE | Admitting: Internal Medicine

## 2014-04-02 ENCOUNTER — Ambulatory Visit (INDEPENDENT_AMBULATORY_CARE_PROVIDER_SITE_OTHER): Payer: PRIVATE HEALTH INSURANCE | Admitting: Internal Medicine

## 2014-04-02 ENCOUNTER — Encounter (INDEPENDENT_AMBULATORY_CARE_PROVIDER_SITE_OTHER): Payer: Self-pay | Admitting: Internal Medicine

## 2014-04-02 VITALS — BP 110/80 | HR 78 | Temp 97.9°F | Resp 18 | Ht 67.0 in | Wt 208.7 lb

## 2014-04-02 DIAGNOSIS — K5090 Crohn's disease, unspecified, without complications: Secondary | ICD-10-CM

## 2014-04-02 DIAGNOSIS — K589 Irritable bowel syndrome without diarrhea: Secondary | ICD-10-CM

## 2014-04-02 MED ORDER — DICYCLOMINE HCL 10 MG PO CAPS: 10.0000 mg | ORAL_CAPSULE | Freq: Three times a day (TID) | ORAL | Status: DC

## 2014-04-02 NOTE — Progress Notes (Signed)
Presenting complaint;  Abdominal cramps and diarrhea  Subjective:  Patient is 53 year old Caucasian female with history of small bowel Crohn's disease, history of sigmoid diverticulitis diverticulitis who was doing fine until January, 2015 when she developed shortness of breath and cough. She was seen in emergency room and treated with steroids and antibiotic. She returned in February and we treated but did not improve. She was subsequently seen by Dr. Madlyn Frankel of pulmonary service who felt her symptoms are secondary to lisinopril which was discontinued with resolution of her symptoms. For over 2 months she has noted postprandial abdominal cramps bloating and diarrhea. She is having anywhere from 15-20 stools in a 24-hour period. She is having 3 stools at night. Stool volume is small insistence he ranges from soft to loose. She denies melena or rectal bleeding. She also denies nausea vomiting fever or chills. She feels tired. She has quit drinking colas and tea. She does not understand why she has only lost 3 pounds. She is still smoking cigarettes but normal within 2 or 3 a day. She was seen by Ms. Setzer NP over 6 weeks ago and tried on Dexilant but without symptomatic improvement.  GI history; Small bowel Crohn's disease was diagnosed 12 years ago. She is status post right hemicolectomy in [facility]. 6-MP had to be discontinued because of transaminitis. Last colonoscopy was in January 2010 at [facility] revealing disease in neoterminal ileum. Streaky of sigmoid diverticulitis; she had an episode in the fall of 2013 and more recently in April 2014.   Current Medications: Outpatient Encounter Prescriptions as of 04/02/2014  Medication Sig  . mesalamine (PENTASA) 500 MG CR capsule Take 1,000 mg by mouth 3 (three) times daily.   Marland Kitchen omeprazole (PRILOSEC) 20 MG capsule Take 2 capsules (40 mg total) by mouth daily.  . potassium chloride SA (K-DUR,KLOR-CON) 20 MEQ tablet Take 1  tablet (20 mEq total) by mouth daily.  . valsartan-hydrochlorothiazide (DIOVAN HCT) 160-12.5 MG per tablet Take 1 tablet by mouth daily.  . [DISCONTINUED] albuterol (PROVENTIL) (2.5 MG/3ML) 0.083% nebulizer solution Take 3 mLs (2.5 mg total) by nebulization every 4 (four) hours as needed for wheezing or shortness of breath.  . [DISCONTINUED] predniSONE (DELTASONE) 10 MG tablet 20 mg. Take  4 each am x 2 days,   2 each am x 2 days,  1 each am x 2 days and stop     Objective: Blood pressure 110/80, pulse 78, temperature 97.9 F (36.6 C), temperature source Oral, resp. rate 18, height 5' 7"  (1.702 m), weight 208 lb 11.2 oz (94.666 kg). Patient is alert and in no acute distress. Conjunctiva is pink. Sclera is nonicteric Oropharyngeal mucosa is normal. No neck masses or thyromegaly noted. Cardiac exam with regular rhythm normal S1 and S2. No murmur or gallop noted. Lungs are clear to auscultation. Abdomen is symmetrical. Bowel sounds are normal. On palpation abdomen is soft with mild tenderness in LLQ. No organomegaly or masses.  No LE edema or clubbing noted.  Labs/studies Results: CRP on 02/08/2014 was less than 0.5   Assessment:  #1. Diarrhea and abdominal cramps in a patient with history of small bowel Crohn's disease. Symptoms began two months ago when she was given prednisone and antibiotics for breathing problems. Symptoms have not subsided now that she's been off these medications. CRP was normal about 6 weeks ago. She does not appear to be acutely ill. Symptoms possibly secondary to IBS but if she does not respond she will  need further workup. #2. GERD. Symptoms are well controlled with therapy.   Plan:  Dicyclomine 10 mg by mouth 30 minutes before each meal. Patient will call with progress report in one week and if she is not any better we'll proceed with MRI enterography. Office visit in 3 months.

## 2014-04-02 NOTE — Patient Instructions (Addendum)
Please call office with progress report in one week.

## 2014-04-26 ENCOUNTER — Telehealth (INDEPENDENT_AMBULATORY_CARE_PROVIDER_SITE_OTHER): Payer: Self-pay | Admitting: *Deleted

## 2014-04-26 NOTE — Telephone Encounter (Signed)
I have called and ask Elizabeth [name] to call me. This is in regard to a question and its answer on the [facility] Application.

## 2014-04-29 ENCOUNTER — Telehealth (INDEPENDENT_AMBULATORY_CARE_PROVIDER_SITE_OTHER): Payer: Self-pay | Admitting: *Deleted

## 2014-04-29 NOTE — Telephone Encounter (Signed)
Patient called me at home on Friday.

## 2014-04-29 NOTE — Telephone Encounter (Signed)
She called Friday 3:27  Call her at work Monday

## 2014-05-23 ENCOUNTER — Telehealth (INDEPENDENT_AMBULATORY_CARE_PROVIDER_SITE_OTHER): Payer: Self-pay | Admitting: *Deleted

## 2014-05-23 NOTE — Telephone Encounter (Signed)
LM asking for [name] to please return her call tomorrow at work and have her paged. The return phone number is [phone].

## 2014-05-24 NOTE — Telephone Encounter (Signed)
Patient states that she is needing to have the FMLA form renewed. She failed to get it to [place] on time, it was due 05-20-14. Her place of employment is giving her 15 days to get it completed. She says that it is the mail to [place]. Once it is done she ask that we mail it or call her and she will come and get it. Also stated that she will pay for this being done.

## 2014-05-24 NOTE — Telephone Encounter (Signed)
noted 

## 2014-05-27 NOTE — Telephone Encounter (Signed)
Noted  

## 2014-06-17 ENCOUNTER — Telehealth (INDEPENDENT_AMBULATORY_CARE_PROVIDER_SITE_OTHER): Payer: Self-pay | Admitting: *Deleted

## 2014-06-17 NOTE — Telephone Encounter (Addendum)
Elizabeth [name] ate tomatoes this weekend that left her with abd pain. She would like to speak with Elizabeth [name]. Please return the call to [phone].

## 2014-06-18 NOTE — Telephone Encounter (Signed)
Per Dr.Rehman - may call in Flagyl 500 mg take by mouth  twice a day for 10 days , Cipro 500 mg take by mouth twice a day for 10 days. These prescriptions were called to [facility]. Patient was made aware.

## 2014-07-02 ENCOUNTER — Telehealth (INDEPENDENT_AMBULATORY_CARE_PROVIDER_SITE_OTHER): Payer: Self-pay | Admitting: *Deleted

## 2014-07-02 NOTE — Telephone Encounter (Signed)
Patient called in this morning stating she is still having problems.  Fustrated that she can't get anyone to see her this week.  Understood that she is still taking Cipro and Flagyl and still hurting bottom of stomach.  She will be home after 3:45 today--she left another # [phone] to call.

## 2014-07-02 NOTE — Telephone Encounter (Signed)
Patient was called. She states that she was still taking the Flagyl and Cipro. Afebrile, denies nausea and or vomiting. States that her bowel movements are extremely foul smelling. Her pain is in the bottom of her abdomen. Not a constant pain. States that when this hits her is breath taking. I offered her multiple times an appointment with out NP. She stated that she was planning to stay out of work tomorrow and go to the ED because she wanted something done now. Concerned of blockage.  I offered her an appointment again , she ask if Dr.Rehman was here and I told her no. He is on vacation. She ask me what would I do? I explained that she has stated her concerns to me. If she ws hurting this bad and concerned , then why wait until tomorrow to go to the ED, go tonight. I told her if not , then we would be glad to see her in the office tomorrow.  She states that she is going to see how she feels. Patient advised to call us in the morning, first thing, so that we could give her an appointment.

## 2014-07-03 ENCOUNTER — Inpatient Hospital Stay (HOSPITAL_COMMUNITY)
Admission: EM | Admit: 2014-07-03 | Discharge: 2014-07-04 | DRG: 392 | Disposition: A | Discharge status: Home / Self Care | Payer: PRIVATE HEALTH INSURANCE | Attending: Internal Medicine | Admitting: Internal Medicine

## 2014-07-03 ENCOUNTER — Emergency Department (HOSPITAL_COMMUNITY): Payer: PRIVATE HEALTH INSURANCE

## 2014-07-03 ENCOUNTER — Encounter (HOSPITAL_COMMUNITY): Payer: Self-pay | Admitting: Emergency Medicine

## 2014-07-03 DIAGNOSIS — R1013 Epigastric pain: Secondary | ICD-10-CM

## 2014-07-03 DIAGNOSIS — E669 Obesity, unspecified: Secondary | ICD-10-CM | POA: Diagnosis present

## 2014-07-03 DIAGNOSIS — K589 Irritable bowel syndrome without diarrhea: Secondary | ICD-10-CM

## 2014-07-03 DIAGNOSIS — Z801 Family history of malignant neoplasm of trachea, bronchus and lung: Secondary | ICD-10-CM

## 2014-07-03 DIAGNOSIS — K5000 Crohn's disease of small intestine without complications: Secondary | ICD-10-CM | POA: Diagnosis present

## 2014-07-03 DIAGNOSIS — I10 Essential (primary) hypertension: Secondary | ICD-10-CM | POA: Diagnosis present

## 2014-07-03 DIAGNOSIS — Z9049 Acquired absence of other specified parts of digestive tract: Secondary | ICD-10-CM

## 2014-07-03 DIAGNOSIS — F17200 Nicotine dependence, unspecified, uncomplicated: Secondary | ICD-10-CM | POA: Diagnosis present

## 2014-07-03 DIAGNOSIS — K5732 Diverticulitis of large intestine without perforation or abscess without bleeding: Principal | ICD-10-CM

## 2014-07-03 DIAGNOSIS — R058 Other specified cough: Secondary | ICD-10-CM | POA: Diagnosis present

## 2014-07-03 DIAGNOSIS — R05 Cough: Secondary | ICD-10-CM

## 2014-07-03 DIAGNOSIS — R1032 Left lower quadrant pain: Secondary | ICD-10-CM

## 2014-07-03 DIAGNOSIS — K219 Gastro-esophageal reflux disease without esophagitis: Secondary | ICD-10-CM | POA: Diagnosis present

## 2014-07-03 DIAGNOSIS — E876 Hypokalemia: Secondary | ICD-10-CM | POA: Diagnosis present

## 2014-07-03 DIAGNOSIS — Z90710 Acquired absence of both cervix and uterus: Secondary | ICD-10-CM

## 2014-07-03 DIAGNOSIS — K5792 Diverticulitis of intestine, part unspecified, without perforation or abscess without bleeding: Secondary | ICD-10-CM | POA: Diagnosis present

## 2014-07-03 DIAGNOSIS — Z720 Tobacco use: Secondary | ICD-10-CM | POA: Diagnosis present

## 2014-07-03 DIAGNOSIS — R1031 Right lower quadrant pain: Secondary | ICD-10-CM

## 2014-07-03 DIAGNOSIS — E869 Volume depletion, unspecified: Secondary | ICD-10-CM | POA: Diagnosis present

## 2014-07-03 HISTORY — DX: Obesity, unspecified: E66.9

## 2014-07-03 HISTORY — DX: Diverticulitis of intestine, part unspecified, without perforation or abscess without bleeding: K57.92

## 2014-07-03 HISTORY — DX: Pneumonia, unspecified organism: J18.9

## 2014-07-03 HISTORY — DX: Gastro-esophageal reflux disease without esophagitis: K21.9

## 2014-07-03 HISTORY — DX: Tobacco use: Z72.0

## 2014-07-03 LAB — CBC WITH DIFFERENTIAL/PLATELET
Basophils Absolute: 0 10*3/uL (ref 0.0–0.1)
Basophils Relative: 0 % (ref 0–1)
EOS PCT: 1 % (ref 0–5)
Eosinophils Absolute: 0.1 10*3/uL (ref 0.0–0.7)
HCT: 40.1 % (ref 36.0–46.0)
HEMOGLOBIN: 14.1 g/dL (ref 12.0–15.0)
Lymphocytes Relative: 20 % (ref 12–46)
Lymphs Abs: 1.4 10*3/uL (ref 0.7–4.0)
MCH: 33 pg (ref 26.0–34.0)
MCHC: 35.2 g/dL (ref 30.0–36.0)
MCV: 93.9 fL (ref 78.0–100.0)
MONOS PCT: 9 % (ref 3–12)
Monocytes Absolute: 0.6 10*3/uL (ref 0.1–1.0)
NEUTROS PCT: 70 % (ref 43–77)
Neutro Abs: 4.8 10*3/uL (ref 1.7–7.7)
Platelets: 220 10*3/uL (ref 150–400)
RBC: 4.27 MIL/uL (ref 3.87–5.11)
RDW: 12.6 % (ref 11.5–15.5)
WBC: 6.9 10*3/uL (ref 4.0–10.5)

## 2014-07-03 LAB — COMPREHENSIVE METABOLIC PANEL
ALK PHOS: 66 U/L (ref 39–117)
ALT: 19 U/L (ref 0–35)
AST: 17 U/L (ref 0–37)
Albumin: 3.3 g/dL — ABNORMAL LOW (ref 3.5–5.2)
Anion gap: 13 (ref 5–15)
BUN: 11 mg/dL (ref 6–23)
CO2: 27 mEq/L (ref 19–32)
Calcium: 8.7 mg/dL (ref 8.4–10.5)
Chloride: 100 mEq/L (ref 96–112)
Creatinine, Ser: 0.76 mg/dL (ref 0.50–1.10)
GLUCOSE: 93 mg/dL (ref 70–99)
POTASSIUM: 2.9 meq/L — AB (ref 3.7–5.3)
Sodium: 140 mEq/L (ref 137–147)
Total Bilirubin: 0.5 mg/dL (ref 0.3–1.2)
Total Protein: 6.2 g/dL (ref 6.0–8.3)

## 2014-07-03 LAB — URINALYSIS, ROUTINE W REFLEX MICROSCOPIC
Bilirubin Urine: NEGATIVE
GLUCOSE, UA: NEGATIVE mg/dL
KETONES UR: NEGATIVE mg/dL
Leukocytes, UA: NEGATIVE
Nitrite: NEGATIVE
PROTEIN: NEGATIVE mg/dL
Specific Gravity, Urine: 1.01 (ref 1.005–1.030)
UROBILINOGEN UA: 0.2 mg/dL (ref 0.0–1.0)
pH: 5.5 (ref 5.0–8.0)

## 2014-07-03 LAB — URINE MICROSCOPIC-ADD ON

## 2014-07-03 LAB — CLOSTRIDIUM DIFFICILE BY PCR: CDIFFPCR: NEGATIVE

## 2014-07-03 LAB — LIPASE, BLOOD: Lipase: 20 U/L (ref 11–59)

## 2014-07-03 LAB — MAGNESIUM: MAGNESIUM: 1.7 mg/dL (ref 1.5–2.5)

## 2014-07-03 MED ORDER — MORPHINE SULFATE 4 MG/ML IJ SOLN
4.0000 mg | Freq: Once | INTRAMUSCULAR | Status: AC
  Administered 2014-07-03: 4 mg via INTRAVENOUS
  Filled 2014-07-03: qty 1

## 2014-07-03 MED ORDER — HYDROCODONE-ACETAMINOPHEN 5-325 MG PO TABS
1.0000 | ORAL_TABLET | ORAL | Status: DC | PRN
  Administered 2014-07-03: 1 via ORAL
  Filled 2014-07-03: qty 1

## 2014-07-03 MED ORDER — POTASSIUM CHLORIDE CRYS ER 20 MEQ PO TBCR
20.0000 meq | EXTENDED_RELEASE_TABLET | Freq: Once | ORAL | Status: AC
  Administered 2014-07-03: 20 meq via ORAL
  Filled 2014-07-03: qty 1

## 2014-07-03 MED ORDER — ONDANSETRON HCL 4 MG/2ML IJ SOLN: 4.0000 mg | Freq: Four times a day (QID) | INTRAMUSCULAR | Status: DC | PRN

## 2014-07-03 MED ORDER — SODIUM CHLORIDE 0.9 % IV BOLUS (SEPSIS)
1000.0000 mL | Freq: Once | INTRAVENOUS | Status: AC
  Administered 2014-07-03: 1000 mL via INTRAVENOUS

## 2014-07-03 MED ORDER — CIPROFLOXACIN IN D5W 400 MG/200ML IV SOLN
400.0000 mg | Freq: Two times a day (BID) | INTRAVENOUS | Status: DC
  Administered 2014-07-03 – 2014-07-04 (×2): 400 mg via INTRAVENOUS
  Filled 2014-07-03 (×2): qty 200

## 2014-07-03 MED ORDER — METRONIDAZOLE IN NACL 5-0.79 MG/ML-% IV SOLN: 500.0000 mg | Freq: Once | INTRAVENOUS | Status: DC

## 2014-07-03 MED ORDER — TRAZODONE HCL 50 MG PO TABS: 25.0000 mg | ORAL_TABLET | Freq: Every evening | ORAL | Status: DC | PRN

## 2014-07-03 MED ORDER — METRONIDAZOLE IN NACL 5-0.79 MG/ML-% IV SOLN
500.0000 mg | Freq: Three times a day (TID) | INTRAVENOUS | Status: DC
  Administered 2014-07-03 – 2014-07-04 (×4): 500 mg via INTRAVENOUS
  Filled 2014-07-03 (×3): qty 100

## 2014-07-03 MED ORDER — POTASSIUM CHLORIDE 10 MEQ/100ML IV SOLN
10.0000 meq | Freq: Once | INTRAVENOUS | Status: AC
  Administered 2014-07-03: 10 meq via INTRAVENOUS
  Filled 2014-07-03: qty 100

## 2014-07-03 MED ORDER — ENOXAPARIN SODIUM 40 MG/0.4ML ~~LOC~~ SOLN
40.0000 mg | Freq: Every day | SUBCUTANEOUS | Status: DC
  Filled 2014-07-03: qty 0.4

## 2014-07-03 MED ORDER — ACETAMINOPHEN 650 MG RE SUPP: 650.0000 mg | Freq: Four times a day (QID) | RECTAL | Status: DC | PRN

## 2014-07-03 MED ORDER — ONDANSETRON HCL 4 MG PO TABS: 4.0000 mg | ORAL_TABLET | Freq: Four times a day (QID) | ORAL | Status: DC | PRN

## 2014-07-03 MED ORDER — POTASSIUM CHLORIDE IN NACL 40-0.9 MEQ/L-% IV SOLN
INTRAVENOUS | Status: DC
  Administered 2014-07-03: 50 mL/h via INTRAVENOUS
  Administered 2014-07-04: 125 mL/h via INTRAVENOUS

## 2014-07-03 MED ORDER — ACETAMINOPHEN 325 MG PO TABS: 650.0000 mg | ORAL_TABLET | Freq: Four times a day (QID) | ORAL | Status: DC | PRN

## 2014-07-03 MED ORDER — HYDROCODONE-ACETAMINOPHEN 5-325 MG PO TABS
1.0000 | ORAL_TABLET | ORAL | Status: DC | PRN
  Administered 2014-07-03: 2 via ORAL
  Filled 2014-07-03: qty 2

## 2014-07-03 MED ORDER — MORPHINE SULFATE 4 MG/ML IJ SOLN
4.0000 mg | INTRAMUSCULAR | Status: DC | PRN
  Administered 2014-07-03 – 2014-07-04 (×5): 4 mg via INTRAVENOUS
  Filled 2014-07-03 (×5): qty 1

## 2014-07-03 MED ORDER — ONDANSETRON HCL 4 MG/2ML IJ SOLN
4.0000 mg | Freq: Once | INTRAMUSCULAR | Status: AC
  Administered 2014-07-03: 4 mg via INTRAVENOUS
  Filled 2014-07-03: qty 2

## 2014-07-03 MED ORDER — CIPROFLOXACIN IN D5W 400 MG/200ML IV SOLN
400.0000 mg | Freq: Once | INTRAVENOUS | Status: AC
  Administered 2014-07-03: 400 mg via INTRAVENOUS
  Filled 2014-07-03: qty 200

## 2014-07-03 MED ORDER — ALUM & MAG HYDROXIDE-SIMETH 200-200-20 MG/5ML PO SUSP: 30.0000 mL | Freq: Four times a day (QID) | ORAL | Status: DC | PRN

## 2014-07-03 NOTE — ED Notes (Signed)
MD at bedside. 

## 2014-07-03 NOTE — ED Notes (Signed)
Pt states has hx of diverticulitis and Crohns. Has been having pain to lower abd and pressure since July 5. States could not see pcp and is in pain. Burning with urination x 2 weeks. States stools are more loose than normal. nad at this time. Mm wet.

## 2014-07-03 NOTE — ED Provider Notes (Signed)
Medical screening examination/treatment/procedure(s) were conducted as a shared visit with non-physician practitioner(s) and myself.  I personally evaluated the patient during the encounter.   EKG Interpretation None        Results for orders placed during the hospital encounter of 07/03/14  CBC WITH DIFFERENTIAL      Result Value Ref Range   WBC 6.9  4.0 - 10.5 K/uL   RBC 4.27  3.87 - 5.11 MIL/uL   Hemoglobin 14.1  12.0 - 15.0 g/dL   HCT 40.1  36.0 - 46.0 %   MCV 93.9  78.0 - 100.0 fL   MCH 33.0  26.0 - 34.0 pg   MCHC 35.2  30.0 - 36.0 g/dL   RDW 12.6  11.5 - 15.5 %   Platelets 220  150 - 400 K/uL   Neutrophils Relative % 70  43 - 77 %   Neutro Abs 4.8  1.7 - 7.7 K/uL   Lymphocytes Relative 20  12 - 46 %   Lymphs Abs 1.4  0.7 - 4.0 K/uL   Monocytes Relative 9  3 - 12 %   Monocytes Absolute 0.6  0.1 - 1.0 K/uL   Eosinophils Relative 1  0 - 5 %   Eosinophils Absolute 0.1  0.0 - 0.7 K/uL   Basophils Relative 0  0 - 1 %   Basophils Absolute 0.0  0.0 - 0.1 K/uL  COMPREHENSIVE METABOLIC PANEL      Result Value Ref Range   Sodium 140  137 - 147 mEq/L   Potassium 2.9 (*) 3.7 - 5.3 mEq/L   Chloride 100  96 - 112 mEq/L   CO2 27  19 - 32 mEq/L   Glucose, Bld 93  70 - 99 mg/dL   BUN 11  6 - 23 mg/dL   Creatinine, Ser 0.76  0.50 - 1.10 mg/dL   Calcium 8.7  8.4 - 10.5 mg/dL   Total Protein 6.2  6.0 - 8.3 g/dL   Albumin 3.3 (*) 3.5 - 5.2 g/dL   AST 17  0 - 37 U/L   ALT 19  0 - 35 U/L   Alkaline Phosphatase 66  39 - 117 U/L   Total Bilirubin 0.5  0.3 - 1.2 mg/dL   GFR calc non Af Amer >90  >90 mL/min   GFR calc Af Amer >90  >90 mL/min   Anion gap 13  5 - 15  LIPASE, BLOOD      Result Value Ref Range   Lipase 20  11 - 59 U/L  URINALYSIS, ROUTINE W REFLEX MICROSCOPIC      Result Value Ref Range   Color, Urine YELLOW  YELLOW   APPearance CLEAR  CLEAR   Specific Gravity, Urine 1.010  1.005 - 1.030   pH 5.5  5.0 - 8.0   Glucose, UA NEGATIVE  NEGATIVE mg/dL   Hgb urine dipstick  MODERATE (*) NEGATIVE   Bilirubin Urine NEGATIVE  NEGATIVE   Ketones, ur NEGATIVE  NEGATIVE mg/dL   Protein, ur NEGATIVE  NEGATIVE mg/dL   Urobilinogen, UA 0.2  0.0 - 1.0 mg/dL   Nitrite NEGATIVE  NEGATIVE   Leukocytes, UA NEGATIVE  NEGATIVE  URINE MICROSCOPIC-ADD ON      Result Value Ref Range   Squamous Epithelial / LPF MANY (*) RARE   RBC / HPF 7-10  <3 RBC/hpf   Bacteria, UA FEW (*) RARE   Ct Abdomen Pelvis Wo Contrast  07/03/2014   CLINICAL DATA:  Lower abdominal pain, nausea.  Past history of Crohn's disease.  EXAM: CT ABDOMEN AND PELVIS WITHOUT CONTRAST  TECHNIQUE: Multidetector CT imaging of the abdomen and pelvis was performed following the standard protocol without IV contrast.  COMPARISON:  Prior CT abdomen/ pelvis 03/28/2013  FINDINGS: Lower Chest: The lung bases are clear. Visualized cardiac structures are within normal limits for size. No pericardial effusion. Unremarkable visualized distal thoracic esophagus.  Abdomen: Unenhanced CT was performed per clinician order. Lack of IV contrast limits sensitivity and specificity, especially for evaluation of abdominal/pelvic solid viscera. Within these limitations, unremarkable CT appearance of the stomach and duodenum (save for a periampullary duodenum diverticulum), spleen, adrenal glands and pancreas. Normal hepatic contour and morphology. No discrete hepatic lesion. Gallbladder is unremarkable. No intra or extrahepatic biliary ductal dilatation. Unremarkable appearance of the bilateral kidneys. No focal solid lesion, hydronephrosis or nephrolithiasis.  Sigmoid colonic diverticulosis. Focal wall thickening of the sigmoid colon in the low mid abdomen with associated interstitial stranding in the sigmoid mesial colon and pericolonic fat. Findings are most suspicious for acute uncomplicated diverticulitis. No evidence of free air or fluid. No drainable abscess. Small peripherally calcified fat attenuation structure in the region most consistent  with the sequelae of old epiploic appendagitis. Surgical staple line at the base of the seek dome consistent with prior appendectomy. No suspicious adenopathy.  Pelvis: Surgical changes of prior hysterectomy. Unremarkable appearance of the bladder.  Bones/Soft Tissues: No acute fracture or aggressive appearing lytic or blastic osseous lesion.  Vascular: Limited evaluation in the absence of intravenous contrast. Atherosclerotic vascular disease without significant stenosis or aneurysmal dilatation.  IMPRESSION: 1. CT findings are most consistent with acute uncomplicated sigmoid diverticulitis. Recommend interval colonoscopy following resolution of the patient's acute symptoms to exclude the possibility of an underlying colonic neoplasm. 2. Scattered atherosclerotic vascular calcifications. 3. Periampullary duodenum diverticulum.   Electronically Signed   By: Jacqulynn Cadet M.D.   On: 07/03/2014 11:17        The patient with history of Crohn's disease and diverticulitis. Patient's been having suprapubic abdominal pain since July 5, patient has been on Flagyl and Cipro since July 9. CT scan here today shows diverticulitis without complications. Technically this is outpatient failure. In addition patient potassium was low at 2.9. Potassium supplementation provided. Will require admission. Hospital is contacted. They will admit. Patient given Unasyn IV for IV treatment.       Fredia Sorrow, MD 07/03/14 [phone]

## 2014-07-03 NOTE — ED Provider Notes (Signed)
CSN: [id number]     Arrival date & time 07/03/14  1638 History   First MD Initiated Contact with Patient 07/03/14 [phone]     Chief Complaint  Patient presents with  . Abdominal Pain     (Consider location/radiation/quality/duration/timing/severity/associated sxs/prior Treatment) HPI Comments: BRITT THEARD is a 53 y.o. Female with a history of crohn's colitis and diverticulitis presenting with a 17 day history of lower suprapubic pain and pressure along with increased frequency of bowel movements described as looser than normal without blood, but darker than normal.  She denies nausea, vomiting and fever.  She is currently taking cipro and flagyl which was prescribed by Dr. Laural Golden which has not relieved her symptoms.  She denies dysuria but reports dipping her urine yesterday and had rbc's in the dip. She reports constant suprapubic pressure and pain which worsens with intestinal spasm.  She denies weakness, dizziness, shortness of breath, chest pain.  She has found no alleviators.  She is taking her pentasa as prescribed.     The history is provided by the patient.    Past Medical History  Diagnosis Date  . Crohn's disease   . Elevated transaminase level   . Hypertension   . Bronchitis   . Cough 01/28/2014  . Diverticulitis    Past Surgical History  Procedure Laterality Date  . Hemocolectomy  2005    right  . Abdominal hysterectomy  1980  . Appendectomy    . Colonoscopy    . Esophagogastroduodenoscopy  06/16/2012    Procedure: ESOPHAGOGASTRODUODENOSCOPY (EGD);  Surgeon: Rogene Houston, MD;  Location: [facility];  Service: Endoscopy;  Laterality: N/A;  10:30 AM   Family History  Problem Relation Age of Onset  . Healthy Mother   . Lung cancer Father     smoked  . Allergic Disorder Brother   . Healthy Daughter    History  Substance Use Topics  . Smoking status: Current Every Day Smoker -- 0.50 packs/day for 40 years    Types: Cigarettes  . Smokeless tobacco: Never Used     Comment: 2-3 cigarettes a day  . Alcohol Use: Yes     Comment: occ   OB History   Grav Para Term Preterm Abortions TAB SAB Ect Mult Living   1 1 1       1      Review of Systems  Constitutional: Negative for fever and chills.  HENT: Negative for congestion and sore throat.   Eyes: Negative.   Respiratory: Negative for chest tightness and shortness of breath.   Cardiovascular: Negative for chest pain.  Gastrointestinal: Positive for abdominal pain and diarrhea. Negative for nausea, vomiting and blood in stool.  Genitourinary: Negative.   Musculoskeletal: Negative for arthralgias, joint swelling and neck pain.  Skin: Negative.  Negative for rash and wound.  Neurological: Negative for dizziness, weakness, light-headedness, numbness and headaches.  Psychiatric/Behavioral: Negative.       Allergies  Contrast media and Penicillins  Home Medications   Prior to Admission medications   Medication Sig Start Date End Date Taking? Authorizing Provider  ciprofloxacin (CIPRO) 500 MG tablet Take 500 mg by mouth 2 (two) times daily.   Yes Historical Provider, MD  dicyclomine (BENTYL) 10 MG capsule Take 1 capsule (10 mg total) by mouth 3 (three) times daily before meals. 04/02/14  Yes Rogene Houston, MD  mesalamine (PENTASA) 500 MG CR capsule Take 1,000 mg by mouth 3 (three) times daily.    Yes Historical Provider, MD  metroNIDAZOLE (FLAGYL) 500 MG tablet Take 500 mg by mouth 2 (two) times daily.   Yes Historical Provider, MD  omeprazole (PRILOSEC) 20 MG capsule Take 2 capsules (40 mg total) by mouth daily. 06/16/12  Yes Rogene Houston, MD  potassium chloride (K-DUR,KLOR-CON) 10 MEQ tablet Take 10 mEq by mouth daily.   Yes Historical Provider, MD  valsartan-hydrochlorothiazide (DIOVAN HCT) 160-12.5 MG per tablet Take 1 tablet by mouth daily. 01/28/14  Yes Tanda Rockers, MD   BP 101/58  Pulse 68  Temp(Src) 98 F (36.7 C) (Oral)  Resp 14  SpO2 100% Physical Exam  Nursing note and vitals  reviewed. Constitutional: She appears well-developed and well-nourished.  HENT:  Head: Normocephalic and atraumatic.  Eyes: Conjunctivae are normal.  Neck: Normal range of motion.  Cardiovascular: Normal rate, regular rhythm, normal heart sounds and intact distal pulses.   Pulmonary/Chest: Effort normal and breath sounds normal. She has no wheezes.  Abdominal: Soft. Bowel sounds are normal. She exhibits no mass. There is no hepatosplenomegaly. There is tenderness in the suprapubic area. There is no rebound and no guarding.  Musculoskeletal: Normal range of motion.  Neurological: She is alert.  Skin: Skin is warm and dry.  Psychiatric: She has a normal mood and affect.    ED Course  Procedures (including critical care time) Labs Review Labs Reviewed  COMPREHENSIVE METABOLIC PANEL - Abnormal; Notable for the following:    Potassium 2.9 (*)    Albumin 3.3 (*)    All other components within normal limits  URINALYSIS, ROUTINE W REFLEX MICROSCOPIC - Abnormal; Notable for the following:    Hgb urine dipstick MODERATE (*)    All other components within normal limits  URINE MICROSCOPIC-ADD ON - Abnormal; Notable for the following:    Squamous Epithelial / LPF MANY (*)    Bacteria, UA FEW (*)    All other components within normal limits  CBC WITH DIFFERENTIAL  LIPASE, BLOOD    Imaging Review Ct Abdomen Pelvis Wo Contrast  07/03/2014   CLINICAL DATA:  Lower abdominal pain, nausea. Past history of Crohn's disease.  EXAM: CT ABDOMEN AND PELVIS WITHOUT CONTRAST  TECHNIQUE: Multidetector CT imaging of the abdomen and pelvis was performed following the standard protocol without IV contrast.  COMPARISON:  Prior CT abdomen/ pelvis 03/28/2013  FINDINGS: Lower Chest: The lung bases are clear. Visualized cardiac structures are within normal limits for size. No pericardial effusion. Unremarkable visualized distal thoracic esophagus.  Abdomen: Unenhanced CT was performed per clinician order. Lack of IV  contrast limits sensitivity and specificity, especially for evaluation of abdominal/pelvic solid viscera. Within these limitations, unremarkable CT appearance of the stomach and duodenum (save for a periampullary duodenum diverticulum), spleen, adrenal glands and pancreas. Normal hepatic contour and morphology. No discrete hepatic lesion. Gallbladder is unremarkable. No intra or extrahepatic biliary ductal dilatation. Unremarkable appearance of the bilateral kidneys. No focal solid lesion, hydronephrosis or nephrolithiasis.  Sigmoid colonic diverticulosis. Focal wall thickening of the sigmoid colon in the low mid abdomen with associated interstitial stranding in the sigmoid mesial colon and pericolonic fat. Findings are most suspicious for acute uncomplicated diverticulitis. No evidence of free air or fluid. No drainable abscess. Small peripherally calcified fat attenuation structure in the region most consistent with the sequelae of old epiploic appendagitis. Surgical staple line at the base of the seek dome consistent with prior appendectomy. No suspicious adenopathy.  Pelvis: Surgical changes of prior hysterectomy. Unremarkable appearance of the bladder.  Bones/Soft Tissues: No acute fracture or  aggressive appearing lytic or blastic osseous lesion.  Vascular: Limited evaluation in the absence of intravenous contrast. Atherosclerotic vascular disease without significant stenosis or aneurysmal dilatation.  IMPRESSION: 1. CT findings are most consistent with acute uncomplicated sigmoid diverticulitis. Recommend interval colonoscopy following resolution of the patient's acute symptoms to exclude the possibility of an underlying colonic neoplasm. 2. Scattered atherosclerotic vascular calcifications. 3. Periampullary duodenum diverticulum.   Electronically Signed   By: Jacqulynn Cadet M.D.   On: 07/03/2014 11:17     EKG Interpretation None      MDM   Final diagnoses:  Diverticulitis of large intestine  without perforation or abscess without bleeding   Pt with diverticulitis per CT scan today who is failed outpatient treatment with by mouth Flagyl and Cipro.  She will be admitted for IV antibiotics and fluids.  Patient was seen by Dr. Rogene Houston during this ED visit.  Discussed case with Dr. Caryn Section who agrees with admission.  She was ordered IV Flagyl and Cipro while in ED.      Evalee Jefferson, PA-C 07/03/14 1207

## 2014-07-03 NOTE — H&P (Signed)
The patient was seen and examined. Her chart, vital signs, laboratory studies were reviewed. She was discussed with nurse practitioner, Ms. Renard Hamper. Agree with her assessment and plan with additions below.  Will increase the rate of the IV fluids 125 cc an hour as her blood pressure is low-normal secondary to hypovolemia. We'll also add when necessary IV morphine as needed for pain. Will advance her diet slightly to full liquids per her request. If she does not tolerated, will downgrading her diet to clear liquids again. The patient was offered a nicotine patch, but she refused. We'll order tobacco cessation counseling. We'll place her on contact precaution to assess for C. difficile infection.

## 2014-07-03 NOTE — ED Notes (Signed)
EDP in with pt at this time.

## 2014-07-03 NOTE — ED Notes (Signed)
PA in talking with pt about admission. Pt not wanting to stay.

## 2014-07-03 NOTE — ED Notes (Signed)
Patient would like to speak to MD at this time. RN made aware.

## 2014-07-03 NOTE — H&P (Signed)
[facility] History and Physical  [name] ALP:[id number] DOB: 1961/01/12 DOA: 07/03/2014  Referring physician:  PCP: Cleda Mccreedy, MD   Chief Complaint: abdominal pain  HPI: Elizabeth [name] is a 53 y.o. female with a past medical history that includes Crohn's disease, hypertension, diverticulitis present to the emergency department with chief complaint of persistent abdominal pain and persistent diarrhea. Initial evaluation includes CT of the abdomen and pelvis concerning for acute uncomplicated sigmoid diverticulitis as well as hypokalemia.  Patient reports she developed abdominal pain July 5. Associated symptoms include nausea without vomiting and frequent diarrhea. She was placed on Cipro and Flagyl and she reports taking it for 3 days and began to feel better so she stopped. 3 days after that the abdominal pain the diarrhea and the nausea returned. Associated symptoms include decreased by mouth intake. She denies any hematochezia, melena. She denies any vomiting. She describes the pain as sharp and cramp like and located mostly in the lower quadrants. She states initially the stool was very soft but today it is more watery.   In the emergency department she is given a liter of normal saline intravenously, 10 mEq of potassium intravenously, 20 mEq of potassium orally, Zofran, morphine, Cipro and Flagyl. She is hemodynamically stable afebrile and not toxic-appearing.  Review of Systems:  10 point review of systems completed and all systems are negative except as indicated in the history of present illness   Past Medical History  Diagnosis Date  . Crohn's disease   . Elevated transaminase level   . Hypertension   . Bronchitis   . Cough 01/28/2014    upper airway cough syndrome  . Diverticulitis   . Tobacco abuse    Past Surgical History  Procedure Laterality Date  . Hemocolectomy  2005    right  . Abdominal hysterectomy  1980  . Appendectomy    . Colonoscopy    .  Esophagogastroduodenoscopy  06/16/2012    Procedure: ESOPHAGOGASTRODUODENOSCOPY (EGD);  Surgeon: Rogene Houston, MD;  Location: [facility];  Service: Endoscopy;  Laterality: N/A;  10:30 AM   Social History:  reports that she has been smoking Cigarettes.  She has a 20 pack-year smoking history. She has never used smokeless tobacco. She reports that she drinks alcohol. She reports that she does not use illicit drugs.  Allergies  Allergen Reactions  . Contrast Media [Iodinated Diagnostic Agents] Hives and Rash  . Penicillins Hives and Rash    Family History  Problem Relation Age of Onset  . Healthy Mother   . Lung cancer Father     smoked  . Allergic Disorder Brother   . Healthy Daughter      Prior to Admission medications   Medication Sig Start Date End Date Taking? Authorizing Provider  ciprofloxacin (CIPRO) 500 MG tablet Take 500 mg by mouth 2 (two) times daily.   Yes Historical Provider, MD  dicyclomine (BENTYL) 10 MG capsule Take 1 capsule (10 mg total) by mouth 3 (three) times daily before meals. 04/02/14  Yes Rogene Houston, MD  mesalamine (PENTASA) 500 MG CR capsule Take 1,000 mg by mouth 3 (three) times daily.    Yes Historical Provider, MD  metroNIDAZOLE (FLAGYL) 500 MG tablet Take 500 mg by mouth 2 (two) times daily.   Yes Historical Provider, MD  omeprazole (PRILOSEC) 20 MG capsule Take 2 capsules (40 mg total) by mouth daily. 06/16/12  Yes Rogene Houston, MD  potassium chloride (K-DUR,KLOR-CON) 10 MEQ tablet Take 10  mEq by mouth daily.   Yes Historical Provider, MD  valsartan-hydrochlorothiazide (DIOVAN HCT) 160-12.5 MG per tablet Take 1 tablet by mouth daily. 01/28/14  Yes Tanda Rockers, MD   Physical Exam: Filed Vitals:   07/03/14 0810 07/03/14 1050 07/03/14 1228  BP: 123/74 101/58 104/68  Pulse: 79 68 68  Temp: 98 F (36.7 C)  97.6 F (36.4 C)  TempSrc: Oral  Oral  Resp: 19 14 20   SpO2: 100% 100% 96%    Wt Readings from Last 3 Encounters:  04/02/14 94.666 kg  (208 lb 11.2 oz)  02/08/14 96.117 kg (211 lb 14.4 oz)  01/28/14 95.437 kg (210 lb 6.4 oz)    General:  Appears calm and comfortable Eyes: PERRL, normal lids, irises & conjunctiva ENT: Ears are clear nose without drainage oropharynx without erythema or exudate. Mucous membranes of her mouth are pink but slightly dry Neck: no LAD, masses or thyromegaly Cardiovascular: RRR, no m/r/g. No LE edema. Respiratory: Normal effort breath sounds are clear bilaterally but somewhat distant. No wheeze no rhonchi Abdomen: Obese soft positive bowel sounds throughout mild tenderness particularly in the lower quadrants. no guarding no rebounding Skin: no rash or induration seen on limited exam Musculoskeletal: grossly normal tone BUE/BLE joints without swelling/erythema. Ring finger on left hand with missing last digit. Well-healed Psychiatric: grossly normal mood and affect, speech fluent and appropriate Neurologic: grossly non-focal. Speech is clear facial           Labs on Admission:  Basic Metabolic Panel:  Recent Labs Lab 07/03/14 0829  NA 140  K 2.9*  CL 100  CO2 27  GLUCOSE 93  BUN 11  CREATININE 0.76  CALCIUM 8.7   Liver Function Tests:  Recent Labs Lab 07/03/14 0829  AST 17  ALT 19  ALKPHOS 66  BILITOT 0.5  PROT 6.2  ALBUMIN 3.3*    Recent Labs Lab 07/03/14 0829  LIPASE 20   No results found for this basename: AMMONIA,  in the last 168 hours CBC:  Recent Labs Lab 07/03/14 0829  WBC 6.9  NEUTROABS 4.8  HGB 14.1  HCT 40.1  MCV 93.9  PLT 220   Cardiac Enzymes: No results found for this basename: CKTOTAL, CKMB, CKMBINDEX, TROPONINI,  in the last 168 hours  BNP (last 3 results) No results found for this basename: PROBNP,  in the last 8760 hours CBG: No results found for this basename: GLUCAP,  in the last 168 hours  Radiological Exams on Admission: Ct Abdomen Pelvis Wo Contrast  07/03/2014   CLINICAL DATA:  Lower abdominal pain, nausea. Past history of  Crohn's disease.  EXAM: CT ABDOMEN AND PELVIS WITHOUT CONTRAST  TECHNIQUE: Multidetector CT imaging of the abdomen and pelvis was performed following the standard protocol without IV contrast.  COMPARISON:  Prior CT abdomen/ pelvis 03/28/2013  FINDINGS: Lower Chest: The lung bases are clear. Visualized cardiac structures are within normal limits for size. No pericardial effusion. Unremarkable visualized distal thoracic esophagus.  Abdomen: Unenhanced CT was performed per clinician order. Lack of IV contrast limits sensitivity and specificity, especially for evaluation of abdominal/pelvic solid viscera. Within these limitations, unremarkable CT appearance of the stomach and duodenum (save for a periampullary duodenum diverticulum), spleen, adrenal glands and pancreas. Normal hepatic contour and morphology. No discrete hepatic lesion. Gallbladder is unremarkable. No intra or extrahepatic biliary ductal dilatation. Unremarkable appearance of the bilateral kidneys. No focal solid lesion, hydronephrosis or nephrolithiasis.  Sigmoid colonic diverticulosis. Focal wall thickening of the sigmoid colon in  the low mid abdomen with associated interstitial stranding in the sigmoid mesial colon and pericolonic fat. Findings are most suspicious for acute uncomplicated diverticulitis. No evidence of free air or fluid. No drainable abscess. Small peripherally calcified fat attenuation structure in the region most consistent with the sequelae of old epiploic appendagitis. Surgical staple line at the base of the seek dome consistent with prior appendectomy. No suspicious adenopathy.  Pelvis: Surgical changes of prior hysterectomy. Unremarkable appearance of the bladder.  Bones/Soft Tissues: No acute fracture or aggressive appearing lytic or blastic osseous lesion.  Vascular: Limited evaluation in the absence of intravenous contrast. Atherosclerotic vascular disease without significant stenosis or aneurysmal dilatation.  IMPRESSION: 1.  CT findings are most consistent with acute uncomplicated sigmoid diverticulitis. Recommend interval colonoscopy following resolution of the patient's acute symptoms to exclude the possibility of an underlying colonic neoplasm. 2. Scattered atherosclerotic vascular calcifications. 3. Periampullary duodenum diverticulum.   Electronically Signed   By: Jacqulynn Cadet M.D.   On: 07/03/2014 11:17    EKG:   Assessment/Plan Principal Problem:   Diverticulitis: Per CT of the abdomen/pelvis.  Patient with history of same as well as Crohn's. Patient admits to interrupting her outpatient therapy. He reports taking her antibiotics for 3 days and felt better so she stopped. 3 days after that she began with worsening abdominal pain. Will admit to medical floor. Will provide Cipro and Flagyl intravenously. Will obtain stool sample for C. difficile and GI pathogen. Will provide clear liquids as well as pain medicine and anti emetic as needed. She is afebrile and non-toxic-appearing Active Problems:   Hypokalemia: Likely related to GI losses. She received 10 mEq intravenously 20 mEq by mouth in the emergency department. I will admit to her IV fluids. Will check a magnesium level for deficiency. Will recheck in the a.m.   HTN (hypertension) ; systolic blood pressure range [phone] in the emergency department. Home medications include Diovan HCTZ. I will hold this for now. Will monitor closely and resume when indicated.    Tobacco abuse: Smoking cessation counseling offered    Crohn's disease of ileum: Patient's primary is Dr. Laural Golden. Status post hemicolectomy in 2005. Medications include Bentyl and pentasa. Will hold for now.    GERD (gastroesophageal reflux disease): Stable at baseline     Upper airway cough syndrome: Patient sees Dr. Marlene Lard for this. Chart review indicates recommendation to avoid ACE inhibitors. Appears stable at baseline   Code Status: full DVT Prophylaxis: Family Communication: none  present Disposition Plan: home when ready  Time spent: 97 minutes  [facility] Pager [phone]  **Disclaimer: This note may have been dictated with voice recognition software. Similar sounding words can inadvertently be transcribed and this note may contain transcription errors which may not have been corrected upon publication of note.**

## 2014-07-03 NOTE — Progress Notes (Signed)
ANTIBIOTIC CONSULT NOTE - INITIAL  Pharmacy Consult for Cipro Indication: intra-abdominal infection  Allergies  Allergen Reactions  . Contrast Media [Iodinated Diagnostic Agents] Hives and Rash  . Penicillins Hives and Rash    Patient Measurements: Height: 5' 7"  (170.2 cm) IBW/kg (Calculated) : 61.6 Last recorded weight: 94.7kg in April 2015  Vital Signs: Temp: 98 F (36.7 C) (07/22 1322) Temp src: Oral (07/22 1322) BP: 109/70 mmHg (07/22 1322) Pulse Rate: 79 (07/22 1322) Intake/Output from previous day:   Intake/Output from this shift:    Labs:  Recent Labs  07/03/14 0829  WBC 6.9  HGB 14.1  PLT 220  CREATININE 0.76   The CrCl is unknown because both a height and weight (above a minimum accepted value) are required for this calculation. No results found for this basename: VANCOTROUGH, VANCOPEAK, VANCORANDOM, GENTTROUGH, GENTPEAK, GENTRANDOM, TOBRATROUGH, TOBRAPEAK, TOBRARND, AMIKACINPEAK, AMIKACINTROU, AMIKACIN,  in the last 72 hours   Microbiology: No results found for this or any previous visit (from the past 720 hour(s)).  Medical History: Past Medical History  Diagnosis Date  . Crohn's disease   . Elevated transaminase level   . Hypertension   . Bronchitis   . Cough 01/28/2014    upper airway cough syndrome  . Diverticulitis   . Tobacco abuse   . Obesity   . Pneumonia   . GERD (gastroesophageal reflux disease)   . Medical history non-contributory     Medications:  Scheduled:  . ciprofloxacin  400 mg Intravenous Q12H  . enoxaparin (LOVENOX) injection  40 mg Subcutaneous Daily  . metronidazole  500 mg Intravenous Q8H  . [DISCONTINUED] metronidazole  500 mg Intravenous Once   Assessment: 53 yo F who represents to ED with diverticulitis.  She was seen earlier this month for same & discharged on Cipro/Flagyl.  She reportedly only took this regimen for 3 days & stopped b/c she felt better.   PCN allergy noted.  Resuming Cipro/Flagyl IV. Renal  function at patient's baseline.   Goal of Therapy:  Eradicate infection.  Plan:  Cipro 467m IV q12h Flagyl as per MD Duration of therapy per MD- change back to po as appropriate Pharmacy to sign off.  Please re-consult if needed.   LBiagio Borg7/22/2015,1:46 PM

## 2014-07-03 NOTE — ED Notes (Signed)
CRITICAL VALUE ALERT  Critical value received:  K- 2.9  Date of notification:  07/03/2014  Time of notification:  0912  Critical value read back:Yes.    Nurse who received alert:  Iona Coach  Responding MD:  Rogene Houston  Time MD responded:  [phone]

## 2014-07-03 NOTE — ED Notes (Signed)
Floor called for report advised will call them back due to pt not wanting to stay.

## 2014-07-04 DIAGNOSIS — K5000 Crohn's disease of small intestine without complications: Secondary | ICD-10-CM

## 2014-07-04 LAB — BASIC METABOLIC PANEL
ANION GAP: 9 (ref 5–15)
BUN: 4 mg/dL — ABNORMAL LOW (ref 6–23)
CALCIUM: 8.4 mg/dL (ref 8.4–10.5)
CO2: 27 mEq/L (ref 19–32)
CREATININE: 0.7 mg/dL (ref 0.50–1.10)
Chloride: 105 mEq/L (ref 96–112)
GFR calc non Af Amer: >90 mL/min (ref >90)
Glucose, Bld: 97 mg/dL (ref 70–99)
Potassium: 3.8 mEq/L (ref 3.7–5.3)
Sodium: 141 mEq/L (ref 137–147)

## 2014-07-04 LAB — CBC
HCT: 36.6 % (ref 36.0–46.0)
Hemoglobin: 12.7 g/dL (ref 12.0–15.0)
MCH: 33.2 pg (ref 26.0–34.0)
MCHC: 34.7 g/dL (ref 30.0–36.0)
MCV: 95.8 fL (ref 78.0–100.0)
Platelets: 211 10*3/uL (ref 150–400)
RBC: 3.82 MIL/uL — ABNORMAL LOW (ref 3.87–5.11)
RDW: 12.8 % (ref 11.5–15.5)
WBC: 6.2 10*3/uL (ref 4.0–10.5)

## 2014-07-04 LAB — MAGNESIUM: MAGNESIUM: 1.6 mg/dL (ref 1.5–2.5)

## 2014-07-04 MED ORDER — VALSARTAN-HYDROCHLOROTHIAZIDE 160-12.5 MG PO TABS: 1.0000 | ORAL_TABLET | Freq: Every day | ORAL | Status: DC

## 2014-07-04 MED ORDER — ONDANSETRON HCL 4 MG PO TABS: 4.0000 mg | ORAL_TABLET | Freq: Four times a day (QID) | ORAL | Status: DC | PRN

## 2014-07-04 MED ORDER — CIPROFLOXACIN HCL 500 MG PO TABS: 500.0000 mg | ORAL_TABLET | Freq: Two times a day (BID) | ORAL | Status: DC

## 2014-07-04 MED ORDER — OXYCODONE-ACETAMINOPHEN 5-325 MG PO TABS: 1.0000 | ORAL_TABLET | ORAL | Status: DC | PRN

## 2014-07-04 MED ORDER — METRONIDAZOLE 500 MG PO TABS: 500.0000 mg | ORAL_TABLET | Freq: Three times a day (TID) | ORAL | Status: DC

## 2014-07-04 MED ORDER — DICYCLOMINE HCL 10 MG PO CAPS
10.0000 mg | ORAL_CAPSULE | Freq: Three times a day (TID) | ORAL | Status: DC
  Administered 2014-07-04: 10 mg via ORAL
  Filled 2014-07-04: qty 1

## 2014-07-04 MED ORDER — MESALAMINE ER 250 MG PO CPCR
1000.0000 mg | ORAL_CAPSULE | Freq: Three times a day (TID) | ORAL | Status: DC
  Administered 2014-07-04: 1000 mg via ORAL
  Filled 2014-07-04 (×4): qty 4

## 2014-07-04 NOTE — Progress Notes (Signed)
Pt states understanding of discharge instructions and prescriptions given.

## 2014-07-04 NOTE — Discharge Summary (Signed)
Physician Discharge Summary  Elizabeth [name] NFA:[id number] DOB: [birth year] DOA: 07/03/2014  PCP: Cleda Mccreedy, MD  Admit date: 07/03/2014 Discharge date: 07/04/2014  Time spent: Greater than 30 minutes  Recommendations for Outpatient Follow-up:  1. Recommend rechecking the patient's serum potassium at the followup appointment.   Discharge Diagnoses:   1. Acute uncomplicated sigmoid diverticulitis. 2. Crohn's disease of the ileum. 3. Hypokalemia, likely secondary to loose stools. Supplemented and repleted. 4. Hypertension, with low-normal blood pressures during hospitalization. 5. Volume depletion. 6. Obesity. 7. Tobacco abuse. The patient was advised to stop smoking. 8. Gastroesophageal reflux disease.  Discharge Condition: Improved  Diet recommendation: Heart healthy soft, full liquids as tolerated.  Filed Weights   07/03/14 1330 07/03/14 1503  Weight: 96.7 kg (213 lb 3 oz) 96.616 kg (213 lb)    History of present illness:  The patient is a 53 year old woman with a history of Crohn's disease and hypertension, who presented to the emergency department on 07/03/14 with a chief complaint of lower abdominal pain. She had associated nausea but no vomiting. She had frequent loose stools, but denied blacked our his stools or bright red blood per rectum. She reported her symptoms initially to her primary gastroenterologist, Dr. Laural Golden. He prescribed oral Cipro and Flagyl. After taking the medications for 3 days, her symptoms subsided and she stopped taking the antibiotics. However, a day or 2 later, her abdominal pain and nausea returned. In the ED, she was afebrile and hemodynamically stable, but her blood pressure was in the lower 469G systolically. Her lab data were significant for a serum potassium of 2.9. Her lipase and liver transaminases were within normal limits. Her white blood cell count was within normal limits. CT of her abdomen and pelvis revealed uncomplicated sigmoid  diverticulitis. She was admitted for further evaluation and management.  Hospital Course:  The patient was given a liter of IV fluids in the emergency department. She was also given a potassium chloride by mouth and then 1 intravenous runs of potassium chloride. Cipro and Flagyl were started in the ED and continued during the hospitalization. A liquid diet was started without advancement. Her antihypertensive medications were withheld as she was being hydrated. Bentyl and mesalamine were withheld overnight, but were restarted the following morning. PPI therapy was also continued. IV analgesics were ordered as needed for pain as was as needed Zofran for nausea. For further evaluation, C. difficile PCR was ordered. It was negative. Her serum magnesium was also assessed and was within normal limits. Followup serum potassium improved to 3.8.  The patient improved clinically and symptomatically. Her diet was eventually advanced to full liquids which he tolerated well. She was insistent on being discharged, and under the circumstances of no vomiting and tolerating oral medications and liquids, she was discharged to home on 7 more days of Cipro and Flagyl. She was instructed to avoid skipping doses. She voiced understanding. She was also instructed on tobacco cessation. She was encouraged to stop smoking completely. She was instructed to not take Diovan HCTZ until her blood pressure was consistently above 295 systolically. (The patient is a  [employer]).  Procedures:  None  Consultations:  None  Discharge Exam: Filed Vitals:   07/04/14 1507  BP: 134/74  Pulse: 65  Temp: 98 F (36.7 C)  Resp: 18   oxygen saturation 99% on room air.  General: Pleasant alert 53 year old woman sitting up in bed, in no acute distress. Cardiovascular: S1, S2, with no murmurs rubs or gallops. Respiratory: Coarse breath sounds  with occasional wheezes auscultated bilaterally. Breathing nonlabored. Abdomen: Mildly obese,  positive bowel sounds, mildly tender over the left lower quadrant, no distention or masses palpated.  Discharge Instructions You were cared for by a hospitalist during your hospital stay. If you have any questions about your discharge medications or the care you received while you were in the hospital after you are discharged, you can call the unit and asked to speak with the hospitalist on call if the hospitalist that took care of you is not available. Once you are discharged, your primary care physician will handle any further medical issues. Please note that NO REFILLS for any discharge medications will be authorized once you are discharged, as it is imperative that you return to your primary care physician (or establish a relationship with a primary care physician if you do not have one) for your aftercare needs so that they can reassess your need for medications and monitor your lab values.  Discharge Instructions   Diet - low sodium heart healthy    Complete by:  As directed      Discharge instructions    Complete by:  As directed   Recommend full liquids or soft foods for the next 3-4 days.     Increase activity slowly    Complete by:  As directed             Medication List         ciprofloxacin 500 MG tablet  Commonly known as:  CIPRO  Take 1 tablet (500 mg total) by mouth 2 (two) times daily. Take for 7 more days.     dicyclomine 10 MG capsule  Commonly known as:  BENTYL  Take 1 capsule (10 mg total) by mouth 3 (three) times daily before meals.     mesalamine 500 MG CR capsule  Commonly known as:  PENTASA  Take 1,000 mg by mouth 3 (three) times daily.     metroNIDAZOLE 500 MG tablet  Commonly known as:  FLAGYL  Take 1 tablet (500 mg total) by mouth 3 (three) times daily. Take for 7 more days.     omeprazole 20 MG capsule  Commonly known as:  PRILOSEC  Take 2 capsules (40 mg total) by mouth daily.     ondansetron 4 MG tablet  Commonly known as:  ZOFRAN  Take 1  tablet (4 mg total) by mouth every 6 (six) hours as needed for nausea.     oxyCODONE-acetaminophen 5-325 MG per tablet  Commonly known as:  ROXICET  Take 1 tablet by mouth every 4 (four) hours as needed for moderate pain or severe pain.     potassium chloride 10 MEQ tablet  Commonly known as:  K-DUR,KLOR-CON  Take 10 mEq by mouth daily.     valsartan-hydrochlorothiazide 160-12.5 MG per tablet  Commonly known as:  DIOVAN HCT  Take 1 tablet by mouth daily. Restart blood pressure medication when your systolic blood pressure is consistently above 130.       Allergies  Allergen Reactions  . Contrast Media [Iodinated Diagnostic Agents] Hives and Rash  . Penicillins Hives and Rash       Follow-up Information   Follow up with REHMAN,NAJEEB U, MD. (Followup as previously scheduled.)    Specialty:  Gastroenterology   Contact information:   [place], [address] [place] [address] [phone]        The results of significant diagnostics from this hospitalization (including imaging, microbiology, ancillary and laboratory) are listed  below for reference.    Significant Diagnostic Studies: Ct Abdomen Pelvis Wo Contrast  07/03/2014   CLINICAL DATA:  Lower abdominal pain, nausea. Past history of Crohn's disease.  EXAM: CT ABDOMEN AND PELVIS WITHOUT CONTRAST  TECHNIQUE: Multidetector CT imaging of the abdomen and pelvis was performed following the standard protocol without IV contrast.  COMPARISON:  Prior CT abdomen/ pelvis 03/28/2013  FINDINGS: Lower Chest: The lung bases are clear. Visualized cardiac structures are within normal limits for size. No pericardial effusion. Unremarkable visualized distal thoracic esophagus.  Abdomen: Unenhanced CT was performed per clinician order. Lack of IV contrast limits sensitivity and specificity, especially for evaluation of abdominal/pelvic solid viscera. Within these limitations, unremarkable CT appearance of the stomach and duodenum (save for a  periampullary duodenum diverticulum), spleen, adrenal glands and pancreas. Normal hepatic contour and morphology. No discrete hepatic lesion. Gallbladder is unremarkable. No intra or extrahepatic biliary ductal dilatation. Unremarkable appearance of the bilateral kidneys. No focal solid lesion, hydronephrosis or nephrolithiasis.  Sigmoid colonic diverticulosis. Focal wall thickening of the sigmoid colon in the low mid abdomen with associated interstitial stranding in the sigmoid mesial colon and pericolonic fat. Findings are most suspicious for acute uncomplicated diverticulitis. No evidence of free air or fluid. No drainable abscess. Small peripherally calcified fat attenuation structure in the region most consistent with the sequelae of old epiploic appendagitis. Surgical staple line at the base of the seek dome consistent with prior appendectomy. No suspicious adenopathy.  Pelvis: Surgical changes of prior hysterectomy. Unremarkable appearance of the bladder.  Bones/Soft Tissues: No acute fracture or aggressive appearing lytic or blastic osseous lesion.  Vascular: Limited evaluation in the absence of intravenous contrast. Atherosclerotic vascular disease without significant stenosis or aneurysmal dilatation.  IMPRESSION: 1. CT findings are most consistent with acute uncomplicated sigmoid diverticulitis. Recommend interval colonoscopy following resolution of the patient's acute symptoms to exclude the possibility of an underlying colonic neoplasm. 2. Scattered atherosclerotic vascular calcifications. 3. Periampullary duodenum diverticulum.   Electronically Signed   By: Jacqulynn Cadet M.D.   On: 07/03/2014 11:17    Microbiology: Recent Results (from the past 240 hour(s))  CLOSTRIDIUM DIFFICILE BY PCR     Status: None   Collection Time    07/03/14  8:36 PM      Result Value Ref Range Status   C difficile by pcr NEGATIVE  NEGATIVE Final     Labs: Basic Metabolic Panel:  Recent Labs Lab  07/03/14 0829 07/04/14 0527  NA 140 141  K 2.9* 3.8  CL 100 105  CO2 27 27  GLUCOSE 93 97  BUN 11 4*  CREATININE 0.76 0.70  CALCIUM 8.7 8.4  MG 1.7 1.6   Liver Function Tests:  Recent Labs Lab 07/03/14 0829  AST 17  ALT 19  ALKPHOS 66  BILITOT 0.5  PROT 6.2  ALBUMIN 3.3*    Recent Labs Lab 07/03/14 0829  LIPASE 20   No results found for this basename: AMMONIA,  in the last 168 hours CBC:  Recent Labs Lab 07/03/14 0829 07/04/14 0527  WBC 6.9 6.2  NEUTROABS 4.8  --   HGB 14.1 12.7  HCT 40.1 36.6  MCV 93.9 95.8  PLT 220 211   Cardiac Enzymes: No results found for this basename: CKTOTAL, CKMB, CKMBINDEX, TROPONINI,  in the last 168 hours BNP: BNP (last 3 results) No results found for this basename: PROBNP,  in the last 8760 hours CBG: No results found for this basename: GLUCAP,  in the last 168  hours     Signed:  Anabelen Kaminsky  [facility] 07/04/2014, 3:41 PM

## 2014-07-11 ENCOUNTER — Encounter (INDEPENDENT_AMBULATORY_CARE_PROVIDER_SITE_OTHER): Payer: Self-pay | Admitting: Internal Medicine

## 2014-07-11 ENCOUNTER — Other Ambulatory Visit (INDEPENDENT_AMBULATORY_CARE_PROVIDER_SITE_OTHER): Payer: Self-pay | Admitting: *Deleted

## 2014-07-11 ENCOUNTER — Ambulatory Visit (INDEPENDENT_AMBULATORY_CARE_PROVIDER_SITE_OTHER): Payer: PRIVATE HEALTH INSURANCE | Admitting: Internal Medicine

## 2014-07-11 ENCOUNTER — Telehealth (INDEPENDENT_AMBULATORY_CARE_PROVIDER_SITE_OTHER): Payer: Self-pay | Admitting: *Deleted

## 2014-07-11 VITALS — BP 110/70 | HR 66 | Temp 98.4°F | Resp 18 | Ht 67.0 in | Wt 209.1 lb

## 2014-07-11 DIAGNOSIS — K5732 Diverticulitis of large intestine without perforation or abscess without bleeding: Secondary | ICD-10-CM

## 2014-07-11 DIAGNOSIS — K5090 Crohn's disease, unspecified, without complications: Secondary | ICD-10-CM

## 2014-07-11 DIAGNOSIS — Z1211 Encounter for screening for malignant neoplasm of colon: Secondary | ICD-10-CM

## 2014-07-11 NOTE — Telephone Encounter (Signed)
Patient needs movi prep 

## 2014-07-11 NOTE — Progress Notes (Signed)
Presenting complaint;  Followup for diverticulitis;  Subjective:  Patient is 53 year old Caucasian female with history of Crohn's disease of small bowel as well as history of sigmoid diverticulitis was doing well until 06/16/2014 when one day after eating tomato sandwich she developed lower abdominal pain. Pain was constant and made worse with coughing or movement. She did not experience diarrhea rectal bleeding fever or chills. She called our office on 06/18/2014 and was begun on Cipro and metronidazole. She felt better after 3 days and decided to stop the medication because of nausea. Again relapsed and she went back on antibiotics. This time she did not get better and pain continued. She came to the emergency room on 07/03/2014 and CT confirmed on complicated sigmoid colon diverticulitis and she was also found to be hypokalemic and therefore hospitalized for 24 hours. She was discharged again on Cipro and metronidazole. She saw Dr. Eula Fried on 07/08/2014 and was still complaining of lower abdominal pain and he wanted her to be seen this week. She states she is feeling much better. She hasn't had any more pain in the last 2 days. She denies fever chills nausea vomiting melena or rectal bleeding. She does not take NSAIDs. She is worried since she has had diverticulitis before. She does not understand why she's not losing weight despite eating very little. Over the last 3 weeks she has been on soft foods only.    Current Medications: Outpatient Encounter Prescriptions as of 07/11/2014  Medication Sig  . ciprofloxacin (CIPRO) 500 MG tablet Take 1 tablet (500 mg total) by mouth 2 (two) times daily. Take for 7 more days.  Marland Kitchen dicyclomine (BENTYL) 10 MG capsule Take 1 capsule (10 mg total) by mouth 3 (three) times daily before meals.  . mesalamine (PENTASA) 500 MG CR capsule Take 1,000 mg by mouth 3 (three) times daily.   . metroNIDAZOLE (FLAGYL) 500 MG tablet Take 1 tablet (500 mg total) by mouth 3 (three)  times daily. Take for 7 more days.  Marland Kitchen omeprazole (PRILOSEC) 20 MG capsule Take 2 capsules (40 mg total) by mouth daily.  . ondansetron (ZOFRAN) 4 MG tablet Take 1 tablet (4 mg total) by mouth every 6 (six) hours as needed for nausea.  Marland Kitchen oxyCODONE-acetaminophen (ROXICET) 5-325 MG per tablet Take 1 tablet by mouth every 4 (four) hours as needed for moderate pain or severe pain.  . potassium chloride (K-DUR,KLOR-CON) 10 MEQ tablet Take 10 mEq by mouth daily.  . valsartan-hydrochlorothiazide (DIOVAN HCT) 160-12.5 MG per tablet Take 1 tablet by mouth daily. Restart blood pressure medication when your systolic blood pressure is consistently above 130.    Objective: Blood pressure 110/70, pulse 66, temperature 98.4 F (36.9 C), temperature source Oral, resp. rate 18, height 5' 7"  (1.702 m), weight 209 lb 1.6 oz (94.847 kg). Patient is alert and in no acute distress. Conjunctiva is pink. Sclera is nonicteric Oropharyngeal mucosa is normal. No neck masses or thyromegaly noted. Cardiac exam with regular rhythm normal S1 and S2. No murmur or gallop noted. Lungs are clear to auscultation. Abdomen is full. Bowel sounds are normal. On palpation abdomen is soft and nontender without organomegaly or masses.  No LE edema or clubbing noted.  Labs/studies Results:  Lab data from 07/03/2014. WBC 6.9, H&H 14.1 and 40.1 and platelet count 220K   comprehensive chemistry panel was normal except serum potassium of 2.9 and serum albumin of 3.3  Lab data from 07/04/2014; Serum potassium 3.8 H&H was 12.7 and 36.6.  Abdominopelvic CT from  07/03/2014 reviewed with patient.  Assessment:  #1. Sigmoid colon diverticulitis. This is her third episode. She had two episodes last year and responded to antibiotic therapy. This time symptoms have lingered for 3 weeks because she did not follow instructions and stopped antibiotics midway. Review of CT does not show any abnormalities suggest ileocolonic fistula. Therefore I  believe sigmoid diverticulitis is a separate issue. If she has another episode she may consider surgery. #2. History of small bowel Crohn's disease. Clinically she appears to be in remission. Last colonoscopy was in January 2010.  Plan:  Continue Cipro and metronidazole for another 5 days. Notify if abdominal pain recurs. Colonoscopy in 6-8 weeks.

## 2014-07-11 NOTE — Patient Instructions (Addendum)
Stop antibiotics after 5 days. Call if pain relapses. Colonoscopy to bescheduled in the next 6-8 weeks.

## 2014-07-15 ENCOUNTER — Telehealth (INDEPENDENT_AMBULATORY_CARE_PROVIDER_SITE_OTHER): Payer: Self-pay | Admitting: *Deleted

## 2014-07-15 ENCOUNTER — Other Ambulatory Visit (INDEPENDENT_AMBULATORY_CARE_PROVIDER_SITE_OTHER): Payer: Self-pay | Admitting: Internal Medicine

## 2014-07-15 DIAGNOSIS — Z8719 Personal history of other diseases of the digestive system: Secondary | ICD-10-CM

## 2014-07-15 DIAGNOSIS — R109 Unspecified abdominal pain: Secondary | ICD-10-CM

## 2014-07-15 NOTE — Telephone Encounter (Signed)
Patient was called. Denied fever, states that she is taking her Cipro /Flagyl 500 mg as directed daily. Describes the pain as a knife pain upon moving. She is to have Colonoscopy 07/29/14. She also states that she is going to see GYN on tomorrow.   Per Dr.Rehmna - Repeat CT Scan Abd/Pelvic with Contrast. If the pain becomes severe patient is to go to the ED for further evaluation.  Lelon Frohlich -patient will need to be called @ [phone]. She states that she will need Benadryl ,ect called in too.

## 2014-07-15 NOTE — Telephone Encounter (Signed)
Elizabeth [name] said she was told to call back if her stomach started to hurt. She is having a lot of lower abd pain. If she gets upt to walk, she is having to hold her stomach. The return phone number is [phone].

## 2014-07-15 NOTE — Telephone Encounter (Signed)
Prednisone 50 mg Take 1 by mouth @ 13 hours, 7 hours,and 1 hour prior to study. This was called to the [facility].  Marland Kitchen

## 2014-07-15 NOTE — Telephone Encounter (Signed)
CT sch'd 07/16/14 @ 145, patient aware and aware to take Benadryl 50 mg 1 hour prior to test and that his is OTC and tammy will call Prednisone to patient' pharmacy

## 2014-07-16 ENCOUNTER — Ambulatory Visit: Payer: Self-pay | Admitting: Obstetrics & Gynecology

## 2014-07-16 ENCOUNTER — Ambulatory Visit (HOSPITAL_COMMUNITY)
Admission: RE | Admit: 2014-07-16 | Discharge: 2014-07-16 | Disposition: A | Discharge status: Home / Self Care | Payer: PRIVATE HEALTH INSURANCE | Source: Ambulatory Visit | Attending: Internal Medicine | Admitting: Internal Medicine

## 2014-07-16 DIAGNOSIS — Z8719 Personal history of other diseases of the digestive system: Secondary | ICD-10-CM | POA: Insufficient documentation

## 2014-07-16 DIAGNOSIS — N949 Unspecified condition associated with female genital organs and menstrual cycle: Secondary | ICD-10-CM | POA: Insufficient documentation

## 2014-07-16 DIAGNOSIS — R933 Abnormal findings on diagnostic imaging of other parts of digestive tract: Secondary | ICD-10-CM | POA: Insufficient documentation

## 2014-07-16 DIAGNOSIS — R109 Unspecified abdominal pain: Secondary | ICD-10-CM

## 2014-07-16 MED ORDER — IOHEXOL 300 MG/ML  SOLN
100.0000 mL | Freq: Once | INTRAMUSCULAR | Status: AC | PRN
  Administered 2014-07-16: 100 mL via INTRAVENOUS

## 2014-07-17 MED ORDER — PEG-KCL-NACL-NASULF-NA ASC-C 100 G PO SOLR: 1.0000 | Freq: Once | ORAL | Status: DC

## 2014-07-18 ENCOUNTER — Ambulatory Visit: Payer: Self-pay | Admitting: Obstetrics & Gynecology

## 2014-07-22 ENCOUNTER — Ambulatory Visit (INDEPENDENT_AMBULATORY_CARE_PROVIDER_SITE_OTHER): Payer: PRIVATE HEALTH INSURANCE | Admitting: Internal Medicine

## 2014-07-22 ENCOUNTER — Telehealth (INDEPENDENT_AMBULATORY_CARE_PROVIDER_SITE_OTHER): Payer: Self-pay | Admitting: *Deleted

## 2014-07-22 ENCOUNTER — Encounter (INDEPENDENT_AMBULATORY_CARE_PROVIDER_SITE_OTHER): Payer: Self-pay | Admitting: Internal Medicine

## 2014-07-22 VITALS — BP 118/80 | HR 78 | Temp 97.8°F | Resp 18 | Ht 67.0 in | Wt 208.1 lb

## 2014-07-22 DIAGNOSIS — K5732 Diverticulitis of large intestine without perforation or abscess without bleeding: Secondary | ICD-10-CM

## 2014-07-22 DIAGNOSIS — K5090 Crohn's disease, unspecified, without complications: Secondary | ICD-10-CM

## 2014-07-22 DIAGNOSIS — R112 Nausea with vomiting, unspecified: Secondary | ICD-10-CM

## 2014-07-22 MED ORDER — CIPROFLOXACIN HCL 500 MG PO TABS: 500.0000 mg | ORAL_TABLET | Freq: Two times a day (BID) | ORAL | Status: DC

## 2014-07-22 MED ORDER — METRONIDAZOLE 500 MG PO TABS: 500.0000 mg | ORAL_TABLET | Freq: Two times a day (BID) | ORAL | Status: DC

## 2014-07-22 MED ORDER — PREDNISONE 10 MG PO TABS: 30.0000 mg | ORAL_TABLET | Freq: Every day | ORAL | Status: DC

## 2014-07-22 MED ORDER — PROMETHAZINE HCL 25 MG PO TABS: 25.0000 mg | ORAL_TABLET | Freq: Two times a day (BID) | ORAL | Status: DC | PRN

## 2014-07-22 NOTE — Telephone Encounter (Signed)
Call or returned; Would not recommend doing colonoscopy while she is still having pain and significant tenderness; She will call the office next week and if pain is mild to schedule her for colonoscopy sooner than planned
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
## 2014-07-22 NOTE — Telephone Encounter (Signed)
Noted forwarded to [facility] as Juluis Rainier

## 2014-07-22 NOTE — Patient Instructions (Signed)
Drop prednisone dose by 5 mg every week. Continue antibiotics for now. Progress report in 2 weeks.

## 2014-07-22 NOTE — Telephone Encounter (Signed)
Elizabeth [name] had called back to office today since her morning appointment. She ask that Dr. Laural Golden be made aware that she wants to have Colonscopy this week and then precede with surgery at the end of the month. Call back number is [phone]

## 2014-07-23 ENCOUNTER — Telehealth (INDEPENDENT_AMBULATORY_CARE_PROVIDER_SITE_OTHER): Payer: Self-pay | Admitting: *Deleted

## 2014-07-23 NOTE — Telephone Encounter (Signed)
Patient called and states that she would like for [place] to make her appointment /referral with Dr.James Wyatt @ [phone] this week or next week. This is to discuss surgery that she may need to have. Patient states that she is not going to do anything until she talks or will see Dr.Rehman.

## 2014-07-23 NOTE — Telephone Encounter (Signed)
Referral placed in EPIC, scheduler from [place] will contact patient with appt

## 2014-07-29 ENCOUNTER — Encounter (INDEPENDENT_AMBULATORY_CARE_PROVIDER_SITE_OTHER): Payer: Self-pay | Admitting: Internal Medicine

## 2014-08-05 ENCOUNTER — Encounter (INDEPENDENT_AMBULATORY_CARE_PROVIDER_SITE_OTHER): Payer: Self-pay | Admitting: Internal Medicine

## 2014-08-07 ENCOUNTER — Encounter (HOSPITAL_COMMUNITY): Payer: Self-pay | Admitting: Pharmacy Technician

## 2014-08-12 ENCOUNTER — Encounter (INDEPENDENT_AMBULATORY_CARE_PROVIDER_SITE_OTHER): Payer: Self-pay | Admitting: General Surgery

## 2014-08-12 ENCOUNTER — Ambulatory Visit (INDEPENDENT_AMBULATORY_CARE_PROVIDER_SITE_OTHER): Payer: PRIVATE HEALTH INSURANCE | Admitting: General Surgery

## 2014-08-12 VITALS — BP 130/82 | HR 94 | Temp 97.9°F | Ht 67.0 in | Wt 209.0 lb

## 2014-08-12 DIAGNOSIS — K50019 Crohn's disease of small intestine with unspecified complications: Secondary | ICD-10-CM

## 2014-08-12 DIAGNOSIS — R52 Pain, unspecified: Secondary | ICD-10-CM

## 2014-08-12 DIAGNOSIS — K5000 Crohn's disease of small intestine without complications: Secondary | ICD-10-CM

## 2014-08-12 DIAGNOSIS — R1031 Right lower quadrant pain: Secondary | ICD-10-CM

## 2014-08-12 DIAGNOSIS — E669 Obesity, unspecified: Secondary | ICD-10-CM

## 2014-08-12 DIAGNOSIS — Z720 Tobacco use: Secondary | ICD-10-CM

## 2014-08-12 DIAGNOSIS — F17200 Nicotine dependence, unspecified, uncomplicated: Secondary | ICD-10-CM

## 2014-08-12 DIAGNOSIS — K5732 Diverticulitis of large intestine without perforation or abscess without bleeding: Secondary | ICD-10-CM

## 2014-08-12 DIAGNOSIS — R1032 Left lower quadrant pain: Secondary | ICD-10-CM

## 2014-08-12 NOTE — Patient Instructions (Signed)
The cause of your lower abdominal pain is not clear.  It might be due to Crohn's disease of the small intestine  It might be due to Crohn's disease of the colon  It might be due to diverticulitis, although diverticula were not seen on your CT scan.  You are now asymptomatic on prednisone, and that suggests that the cause of her pain is Crohn's disease, although I am not sure  The most important next step is for you have a colonoscopy this Friday.  Return to see Dr. Dalbert Batman in 3 weeks.

## 2014-08-12 NOTE — Progress Notes (Addendum)
Patient ID: Elizabeth [name], female   DOB: 1961/08/15, 53 y.o.   MRN: [id number]  Chief Complaint  Patient presents with  . Diverticulitis    HPI Elizabeth [name] is a 53 y.o. female.  She is referred by Dr.Rehman in [facility]  for evaluation of abdominal pain, Crohn's disease, possible diverticulitis of the sigmoid colon.  She states that she has a history of Crohn's disease and underwent an ileocecal resection through a Pfannenstiel incision at [facility] 10 years ago. She doesn't know what her pathology report showed. She says that she chronically has 10-15 loose stools per day. For the past 6 weeks she's been having bilateral lower abdominal pain. She states it feels like her vagina is going to push out and fall out. Prior brief prior episode in 2013. Over the past 6 weeks she has been on several courses of Cipro and Flagyl but that did not help. She has not taken Cipro or Flagyl for 2 weeks and is now on high-dose prednisone taper. She is now asymptomatic and having 3-4 bowel movements per day. She says her appetite is excellent. She is just nervous because of the prednisone and wants something to be done.  She is scheduled for colonoscopy this Friday.  A CT scan of the abdomen and pelvis with 07/16/2014. There is no abscess. The neoterminal ileum is notable for wall thickening and hyperemia. There is mild diffuse colonic wall thickening in the sigmoid colon is somewhat prominent. There is no evidence for perforation or abscess.The radiologist is not mentioned diverticula but I think [place] diverticula in the area.  She has a female friend with her today.    HPI  Past Medical History  Diagnosis Date  . Crohn's disease   . Elevated transaminase level   . Hypertension   . Bronchitis   . Cough 01/28/2014    upper airway cough syndrome  . Diverticulitis   . Tobacco abuse   . Obesity   . Pneumonia   . GERD (gastroesophageal reflux disease)   . Medical history non-contributory     Past  Surgical History  Procedure Laterality Date  . Hemocolectomy  2005    right  . Abdominal hysterectomy  1980  . Appendectomy    . Colonoscopy    . Esophagogastroduodenoscopy  06/16/2012    Procedure: ESOPHAGOGASTRODUODENOSCOPY (EGD);  Surgeon: Rogene Houston, MD;  Location: [facility];  Service: Endoscopy;  Laterality: N/A;  10:30 AM  . No past surgeries      Family History  Problem Relation Age of Onset  . Healthy Mother   . Lung cancer Father     smoked  . Allergic Disorder Brother   . Healthy Daughter     Social History History  Substance Use Topics  . Smoking status: Current Every Day Smoker -- 0.50 packs/day for 40 years    Types: Cigarettes  . Smokeless tobacco: Never Used     Comment: 2-3 cigarettes a day  . Alcohol Use: Yes     Comment: occ    Allergies  Allergen Reactions  . Contrast Media [Iodinated Diagnostic Agents] Hives and Rash  . Penicillins Hives and Rash    Current Outpatient Prescriptions  Medication Sig Dispense Refill  . mesalamine (PENTASA) 500 MG CR capsule Take 1,000 mg by mouth 3 (three) times daily.       Marland Kitchen omeprazole (PRILOSEC) 20 MG capsule Take 2 capsules (40 mg total) by mouth daily.  30 capsule  5  . potassium  chloride (MICRO-K) 10 MEQ CR capsule Take 10 mEq by mouth 2 (two) times daily.      . predniSONE (DELTASONE) 10 MG tablet Take 10 mg by mouth daily with breakfast.      . promethazine (PHENERGAN) 25 MG tablet Take 1 tablet (25 mg total) by mouth 2 (two) times daily as needed for nausea or vomiting.  16 tablet  0  . oxyCODONE-acetaminophen (ROXICET) 5-325 MG per tablet Take 1 tablet by mouth every 4 (four) hours as needed for moderate pain or severe pain.  20 tablet  0   No current facility-administered medications for this visit.    Review of Systems Review of Systems  Constitutional: Negative for fever, chills and unexpected weight change.  HENT: Negative for congestion, hearing loss, sore throat, trouble swallowing and voice  change.   Eyes: Negative for visual disturbance.  Respiratory: Negative for cough and wheezing.   Cardiovascular: Negative for chest pain, palpitations and leg swelling.  Gastrointestinal: Positive for abdominal pain and diarrhea. Negative for nausea, vomiting, constipation, blood in stool, abdominal distention and anal bleeding.  Genitourinary: Negative for hematuria, vaginal bleeding and difficulty urinating.  Musculoskeletal: Negative for arthralgias.  Skin: Negative for rash and wound.  Neurological: Negative for seizures, syncope and headaches.  Hematological: Negative for adenopathy. Does not bruise/bleed easily.  Psychiatric/Behavioral: Positive for dysphoric mood. Negative for confusion. The patient is nervous/anxious.     Blood pressure 130/82, pulse 94, temperature 97.9 F (36.6 C), temperature source Oral, height 5' 7"  (1.702 m), weight 209 lb (94.802 kg).  Physical Exam Physical Exam  Constitutional: She is oriented to person, place, and time. She appears well-developed and well-nourished. No distress.  Pleasant cooperative. Anxious and tearful. Pressured speech.  HENT:  Head: Normocephalic and atraumatic.  Nose: Nose normal.  Mouth/Throat: No oropharyngeal exudate.  Eyes: Conjunctivae and EOM are normal. Pupils are equal, round, and reactive to light. Left eye exhibits no discharge. No scleral icterus.  Neck: Neck supple. No JVD present. No tracheal deviation present. No thyromegaly present.  Cardiovascular: Normal rate, regular rhythm, normal heart sounds and intact distal pulses.   No murmur heard. Pulmonary/Chest: Effort normal and breath sounds normal. No respiratory distress. She has no wheezes. She has no rales. She exhibits no tenderness.  Abdominal: Soft. Bowel sounds are normal. She exhibits no distension and no mass. There is no tenderness. There is no rebound and no guarding.  Obese. Well-healed Pfannenstiel incision. Subjectively tender in the left lower  quadrant but no mass or guarding. No hernias noted. Doesn't really seem tender in the right lower quadrant.  Musculoskeletal: She exhibits no edema and no tenderness.  Lymphadenopathy:    She has no cervical adenopathy.  Neurological: She is alert and oriented to person, place, and time. She exhibits normal muscle tone. Coordination normal.  Skin: Skin is warm. No rash noted. She is not diaphoretic. No erythema. No pallor.  Psychiatric: She has a normal mood and affect. Her behavior is normal. Judgment and thought content normal.    Data Reviewed Recent CT scan. Recent office note with Dr. Laural Golden.  Assessment    Bilateral lower abdominal pain. Etiology is not entirely clear. This may be due to sigmoid diverticulitis. It may be due to Crohn's colitis. The resolution of symptoms of prednisone suggest either inflammatory bowel disease or masking of infectious problems. 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  ADDENDUM (08/20/2014):  I spoke with Dr. Laural Golden on the phone today. He performed a colonoscopy last week. He could not visualize the terminal ileum. He thinks that her symptoms are due to  sigmoid diverticulitis. He said there was no evidence of Crohn's colitis.     He does not think that they are due to Crohn's disease.      No cancer was seen.      I will review his colonoscopy report once it is transcribed.    She has an appointment to see me on September 23  to discuss sigmoid colectomy.  It is possible that this is all inflammatory bowel disease and should be treated medically.  It is also possible that she may require sigmoid colon resection  Return to see me in 3 weeks        Jaclyn Carew M. Dalbert Batman, M.D., [facility] General and Minimally invasive Surgery Breast and Colorectal Surgery Office:   [phone] Pager:   [phone]  08/12/2014, 11:19 AM

## 2014-08-15 ENCOUNTER — Encounter (INDEPENDENT_AMBULATORY_CARE_PROVIDER_SITE_OTHER): Payer: Self-pay | Admitting: Internal Medicine

## 2014-08-16 ENCOUNTER — Encounter (HOSPITAL_COMMUNITY)
Admission: RE | Disposition: A | Discharge status: Home / Self Care | Payer: Self-pay | Source: Ambulatory Visit | Attending: Internal Medicine

## 2014-08-16 ENCOUNTER — Ambulatory Visit (HOSPITAL_COMMUNITY)
Admission: RE | Admit: 2014-08-16 | Discharge: 2014-08-16 | Disposition: A | Discharge status: Home / Self Care | Payer: PRIVATE HEALTH INSURANCE | Source: Ambulatory Visit | Attending: Internal Medicine | Admitting: Internal Medicine

## 2014-08-16 ENCOUNTER — Encounter (HOSPITAL_COMMUNITY): Payer: Self-pay

## 2014-08-16 DIAGNOSIS — K5730 Diverticulosis of large intestine without perforation or abscess without bleeding: Secondary | ICD-10-CM

## 2014-08-16 DIAGNOSIS — D126 Benign neoplasm of colon, unspecified: Secondary | ICD-10-CM

## 2014-08-16 DIAGNOSIS — F17200 Nicotine dependence, unspecified, uncomplicated: Secondary | ICD-10-CM | POA: Diagnosis not present

## 2014-08-16 DIAGNOSIS — K5732 Diverticulitis of large intestine without perforation or abscess without bleeding: Secondary | ICD-10-CM

## 2014-08-16 DIAGNOSIS — I10 Essential (primary) hypertension: Secondary | ICD-10-CM | POA: Insufficient documentation

## 2014-08-16 DIAGNOSIS — K5090 Crohn's disease, unspecified, without complications: Secondary | ICD-10-CM

## 2014-08-16 DIAGNOSIS — Z980 Intestinal bypass and anastomosis status: Secondary | ICD-10-CM | POA: Insufficient documentation

## 2014-08-16 DIAGNOSIS — K648 Other hemorrhoids: Secondary | ICD-10-CM

## 2014-08-16 DIAGNOSIS — K5000 Crohn's disease of small intestine without complications: Secondary | ICD-10-CM | POA: Diagnosis present

## 2014-08-16 DIAGNOSIS — E669 Obesity, unspecified: Secondary | ICD-10-CM | POA: Diagnosis not present

## 2014-08-16 DIAGNOSIS — Z8719 Personal history of other diseases of the digestive system: Secondary | ICD-10-CM

## 2014-08-16 HISTORY — PX: COLONOSCOPY: SHX5424

## 2014-08-16 SURGERY — COLONOSCOPY
Anesthesia: Moderate Sedation

## 2014-08-16 MED ORDER — MEPERIDINE HCL 50 MG/ML IJ SOLN
INTRAMUSCULAR | Status: AC
  Filled 2014-08-16: qty 1

## 2014-08-16 MED ORDER — MIDAZOLAM HCL 5 MG/5ML IJ SOLN
INTRAMUSCULAR | Status: AC
  Filled 2014-08-16: qty 10

## 2014-08-16 MED ORDER — STERILE WATER FOR IRRIGATION IR SOLN
Status: DC | PRN
  Administered 2014-08-16: 11:00:00

## 2014-08-16 MED ORDER — SODIUM CHLORIDE 0.9 % IV SOLN
INTRAVENOUS | Status: DC
  Administered 2014-08-16: 10:00:00 via INTRAVENOUS

## 2014-08-16 MED ORDER — MIDAZOLAM HCL 5 MG/5ML IJ SOLN
INTRAMUSCULAR | Status: DC | PRN
  Administered 2014-08-16 (×3): 2 mg via INTRAVENOUS
  Administered 2014-08-16 (×3): 3 mg via INTRAVENOUS

## 2014-08-16 MED ORDER — MEPERIDINE HCL 50 MG/ML IJ SOLN
INTRAMUSCULAR | Status: DC | PRN
  Administered 2014-08-16 (×4): 25 mg via INTRAVENOUS

## 2014-08-16 NOTE — H&P (Signed)
Elizabeth [name] is an 53 y.o. female.   Chief Complaint: Patient is here for colonoscopy. HPI: Patient is 53 year old Caucasian female who is Crohn's disease. She's had multiple episodes of lower abdominal pain felt to be secondary to diverticulitis. She is considering surgery. She is undergoing diagnostic colonoscopy to make sure she doesn't have Crohn's disease involving sigmoid colon in addition the distal small bowel. With this episode her symptoms have not resolve completely with antibiotics and prednisone was added and now she is asymptomatic.   Past Medical History  Diagnosis Date  . Crohn's disease   . Elevated transaminase level   . Hypertension   . Bronchitis   . Cough 01/28/2014    upper airway cough syndrome  . Diverticulitis   . Tobacco abuse   . Obesity   . Pneumonia   . GERD (gastroesophageal reflux disease)   . Medical history non-contributory     Past Surgical History  Procedure Laterality Date  . Hemocolectomy  2005    right  . Abdominal hysterectomy  1980  . Appendectomy    . Colonoscopy    . Esophagogastroduodenoscopy  06/16/2012    Procedure: ESOPHAGOGASTRODUODENOSCOPY (EGD);  Surgeon: Rogene Houston, MD;  Location: [facility];  Service: Endoscopy;  Laterality: N/A;  10:30 AM  . No past surgeries      Family History  Problem Relation Age of Onset  . Healthy Mother   . Lung cancer Father     smoked  . Allergic Disorder Brother   . Healthy Daughter    Social History:  reports that she has been smoking Cigarettes.  She has a 20 pack-year smoking history. She has never used smokeless tobacco. She reports that she drinks alcohol. She reports that she does not use illicit drugs.  Allergies:  Allergies  Allergen Reactions  . Contrast Media [Iodinated Diagnostic Agents] Hives and Rash  . Penicillins Hives and Rash    Medications Prior to Admission  Medication Sig Dispense Refill  . mesalamine (PENTASA) 500 MG CR capsule Take 1,000 mg by mouth 3 (three)  times daily.       Marland Kitchen omeprazole (PRILOSEC) 20 MG capsule Take 2 capsules (40 mg total) by mouth daily.  30 capsule  5  . oxyCODONE-acetaminophen (ROXICET) 5-325 MG per tablet Take 1 tablet by mouth every 4 (four) hours as needed for moderate pain or severe pain.  20 tablet  0  . potassium chloride (MICRO-K) 10 MEQ CR capsule Take 10 mEq by mouth 2 (two) times daily.      . predniSONE (DELTASONE) 10 MG tablet Take 10 mg by mouth daily with breakfast.      . promethazine (PHENERGAN) 25 MG tablet Take 1 tablet (25 mg total) by mouth 2 (two) times daily as needed for nausea or vomiting.  16 tablet  0    No results found for this or any previous visit (from the past 48 hour(s)). No results found.  ROS  Blood pressure 124/78, pulse 92, temperature 97.9 F (36.6 C), temperature source Oral, resp. rate 16, SpO2 98.00%. Physical Exam  Constitutional: She appears well-developed and well-nourished.  HENT:  Mouth/Throat: Oropharynx is clear and moist.  Eyes: Conjunctivae are normal. No scleral icterus.  Neck: No thyromegaly present.  Cardiovascular: Normal rate, regular rhythm and normal heart sounds.   No murmur heard. Respiratory: Effort normal and breath sounds normal.  GI: She exhibits no distension and no mass. There is no tenderness.  Musculoskeletal: She exhibits no edema.  Lymphadenopathy:    She has no cervical adenopathy.  Neurological: She is alert.  Skin: Skin is warm and dry.     Assessment/Plan History of Crohn's disease and recurrent sigmoid diverticulitis. Diagnostic colonoscopy.  REHMAN,NAJEEB U 08/16/2014, 11:33 AM

## 2014-08-16 NOTE — Progress Notes (Signed)
10 min withdrawal time from the anastomosis, patient has no cecum.

## 2014-08-16 NOTE — Op Note (Signed)
COLONOSCOPY PROCEDURE REPORT  PATIENT:  Elizabeth [name]  MR#:  [id number] Birthdate:  Mar 18, 1961, 53 y.o., female Endoscopist:  Dr. Rogene Houston, MD Referred By:  Dr. Cleda Mccreedy, MD  Procedure Date: 08/16/2014  Procedure:   Colonoscopy  Indications:  Patient is a 53 year old Caucasian female who has history of small bowel Crohn disease with right hemicolectomy about 10 years who keeps having episodes of diverticulitis. It appears she will benefit from sigmoid colon resection. She is undergoing colonoscopy to make sure she does not have colonic Crohn disease. Last colonoscopy was in January 2010 at [facility].  Informed Consent:  The procedure and risks were reviewed with the patient and informed consent was obtained.  Medications:  Demerol 75 mg IV Versed 15 mg IV  Description of procedure:  After a digital rectal exam was performed, that colonoscope was advanced from the anus through the rectum and colon to the area of ileocolonic anastomosis. From this level  scope was slowly and cautiously withdrawn. The mucosal surfaces were carefully surveyed utilizing scope tip to flexion to facilitate fold flattening as needed. The scope was pulled down into the rectum where a thorough exam including retroflexion was performed.  Findings:   Prep excellent. Focal erythema noted to mucosa at ileocolonic anastomosis. Small bowel not be deeply intubated. 6 mm polyp cold snared from descending colon. Scattered diverticula at sigmoid colon which was relatively fixed in noncompliant but no evidence of ulcers to suggest colonic disease. Normal rectal mucosa. Hemorrhoids above the dentate line.     Therapeutic/Diagnostic Maneuvers Performed:  See above  Complications:  None  Colon Withdrawal Time:  10 minutes  Impression:  Examination performed to ileocolonic anastomosis located in the vicinity of the ascending colon but small bowel not deeply intubated. Local erythema noted to ileal mucosa at  anastomosis. 6 mm polyp cold snared from descending colon. Scattered diverticula at sigmoid colon which is noncompliant. No evidence of colonic Crohn's disease.    Recommendations:  Standard instructions given. I will contact patient with biopsy results and further recommendations. Followup with Dr. Fanny Skates for sigmoid colon resection. She may also need  resection of neoterminal ileum if noted to have high grade stricture at the time of surgery. Patient will continue prednisone taper per schedule.  REHMAN,NAJEEB U  08/16/2014 12:36 PM  CC: Dr. Cleda Mccreedy, MD & Dr. Rayne Du ref. provider found

## 2014-08-16 NOTE — Discharge Instructions (Signed)
Resume usual medications and diet. Continue prednisone taper per schedule. No driving for 24 hours. Physician will call with biopsy results next week. Office visit with Dr. Fanny Skates as planned.   Colon Polyps Polyps are lumps of extra tissue growing inside the body. Polyps can grow in the large intestine (colon). Most colon polyps are noncancerous (benign). However, some colon polyps can become cancerous over time. Polyps that are larger than a pea may be harmful. To be safe, caregivers remove and test all polyps. CAUSES  Polyps form when mutations in the genes cause your cells to grow and divide even though no more tissue is needed. RISK FACTORS There are a number of risk factors that can increase your chances of getting colon polyps. They include:  Being older than 50 years.  Family history of colon polyps or colon cancer.  Long-term colon diseases, such as colitis or Crohn disease.  Being overweight.  Smoking.  Being inactive.  Drinking too much alcohol. SYMPTOMS  Most small polyps do not cause symptoms. If symptoms are present, they may include:  Blood in the stool. The stool may look dark red or black.  Constipation or diarrhea that lasts longer than 1 week. DIAGNOSIS People often do not know they have polyps until their caregiver finds them during a regular checkup. Your caregiver can use 4 tests to check for polyps:  Digital rectal exam. The caregiver wears gloves and feels inside the rectum. This test would find polyps only in the rectum.  Barium enema. The caregiver puts a liquid called barium into your rectum before taking X-rays of your colon. Barium makes your colon look white. Polyps are dark, so they are easy to see in the X-ray pictures.  Sigmoidoscopy. A thin, flexible tube (sigmoidoscope) is placed into your rectum. The sigmoidoscope has a light and tiny camera in it. The caregiver uses the sigmoidoscope to look at the last third of your  colon.  Colonoscopy. This test is like sigmoidoscopy, but the caregiver looks at the entire colon. This is the most common method for finding and removing polyps. TREATMENT  Any polyps will be removed during a sigmoidoscopy or colonoscopy. The polyps are then tested for cancer. PREVENTION  To help lower your risk of getting more colon polyps:  Eat plenty of fruits and vegetables. Avoid eating fatty foods.  Do not smoke.  Avoid drinking alcohol.  Exercise every day.  Lose weight if recommended by your caregiver.  Eat plenty of calcium and folate. Foods that are rich in calcium include milk, cheese, and broccoli. Foods that are rich in folate include chickpeas, kidney beans, and spinach. HOME CARE INSTRUCTIONS Keep all follow-up appointments as directed by your caregiver. You may need periodic exams to check for polyps. SEEK MEDICAL CARE IF: You notice bleeding during a bowel movement. Document Released: 08/25/2004 Document Revised: 02/21/2012 Document Reviewed: 02/08/2012 [facility] Patient Information 2015 [place], [place]. This information is not intended to replace advice given to you by your health care provider. Make sure you discuss any questions you have with your health care provider. Colonoscopy, Care After Refer to this sheet in the next few weeks. These instructions provide you with information on caring for yourself after your procedure. Your health care provider may also give you more specific instructions. Your treatment has been planned according to current medical practices, but problems sometimes occur. Call your health care provider if you have any problems or questions after your procedure. WHAT TO EXPECT AFTER THE PROCEDURE  After your  procedure, it is typical to have the following:  A small amount of blood in your stool.  Moderate amounts of gas and mild abdominal cramping or bloating. HOME CARE INSTRUCTIONS  Do not drive, operate machinery, or sign important  documents for 24 hours.  You may shower and resume your regular physical activities, but move at a slower pace for the first 24 hours.  Take frequent rest periods for the first 24 hours.  Walk around or put a warm pack on your abdomen to help reduce abdominal cramping and bloating.  Drink enough fluids to keep your urine clear or pale yellow.  You may resume your normal diet as instructed by your health care provider. Avoid heavy or fried foods that are hard to digest.  Avoid drinking alcohol for 24 hours or as instructed by your health care provider.  Only take over-the-counter or prescription medicines as directed by your health care provider.  If a tissue sample (biopsy) was taken during your procedure:  Do not take aspirin or blood thinners for 7 days, or as instructed by your health care provider.  Do not drink alcohol for 7 days, or as instructed by your health care provider.  Eat soft foods for the first 24 hours. SEEK MEDICAL CARE IF: You have persistent spotting of blood in your stool 2-3 days after the procedure. SEEK IMMEDIATE MEDICAL CARE IF:  You have more than a small spotting of blood in your stool.  You pass large blood clots in your stool.  Your abdomen is swollen (distended).  You have nausea or vomiting.  You have a fever.  You have increasing abdominal pain that is not relieved with medicine. Document Released: 07/13/2004 Document Revised: 09/19/2013 Document Reviewed: 08/06/2013 [facility] Patient Information 2015 [place], [place]. This information is not intended to replace advice given to you by your health care provider. Make sure you discuss any questions you have with your health care provider. Diverticulitis Diverticulitis is when small pockets that have formed in your colon (large intestine) become infected or swollen. HOME CARE  Follow your doctor's instructions.  Follow a special diet if told by your doctor.  When you feel better, your doctor  may tell you to change your diet. You may be told to eat a lot of fiber. Fruits and vegetables are good sources of fiber. Fiber makes it easier to poop (have bowel movements).  Take supplements or probiotics as told by your doctor.  Only take medicines as told by your doctor.  Keep all follow-up visits with your doctor. GET HELP IF:  Your pain does not get better.  You have a hard time eating food.  You are not pooping like normal. GET HELP RIGHT AWAY IF:  Your pain gets worse.  Your problems do not get better.  Your problems suddenly get worse.  You have a fever.  You keep throwing up (vomiting).  You have bloody or black, tarry poop (stool). MAKE SURE YOU:   Understand these instructions.  Will watch your condition.  Will get help right away if you are not doing well or get worse. Document Released: 05/17/2008 Document Revised: 12/04/2013 Document Reviewed: 10/24/2013 [facility] Patient Information 2015 [place], [place]. This information is not intended to replace advice given to you by your health care provider. Make sure you discuss any questions you have with your health care provider.

## 2014-08-19 ENCOUNTER — Encounter (INDEPENDENT_AMBULATORY_CARE_PROVIDER_SITE_OTHER): Payer: Self-pay | Admitting: Internal Medicine

## 2014-08-20 ENCOUNTER — Encounter (HOSPITAL_COMMUNITY): Payer: Self-pay | Admitting: Internal Medicine

## 2014-08-26 ENCOUNTER — Encounter (INDEPENDENT_AMBULATORY_CARE_PROVIDER_SITE_OTHER): Payer: Self-pay | Admitting: Internal Medicine

## 2014-08-28 ENCOUNTER — Encounter (INDEPENDENT_AMBULATORY_CARE_PROVIDER_SITE_OTHER): Payer: Self-pay | Admitting: *Deleted

## 2014-08-29 ENCOUNTER — Encounter (INDEPENDENT_AMBULATORY_CARE_PROVIDER_SITE_OTHER): Payer: Self-pay | Admitting: *Deleted

## 2014-09-04 ENCOUNTER — Other Ambulatory Visit (INDEPENDENT_AMBULATORY_CARE_PROVIDER_SITE_OTHER): Payer: Self-pay

## 2014-09-04 DIAGNOSIS — K5732 Diverticulitis of large intestine without perforation or abscess without bleeding: Secondary | ICD-10-CM

## 2014-09-05 ENCOUNTER — Other Ambulatory Visit (HOSPITAL_COMMUNITY): Payer: Self-pay | Admitting: Family Medicine

## 2014-09-05 ENCOUNTER — Other Ambulatory Visit (INDEPENDENT_AMBULATORY_CARE_PROVIDER_SITE_OTHER): Payer: Self-pay | Admitting: *Deleted

## 2014-09-05 DIAGNOSIS — K5732 Diverticulitis of large intestine without perforation or abscess without bleeding: Secondary | ICD-10-CM

## 2014-09-06 ENCOUNTER — Other Ambulatory Visit (INDEPENDENT_AMBULATORY_CARE_PROVIDER_SITE_OTHER): Payer: Self-pay | Admitting: General Surgery

## 2014-09-08 LAB — CLOSTRIDIUM DIFFICILE BY PCR: Toxigenic C. Difficile by PCR: NOT DETECTED

## 2014-09-11 ENCOUNTER — Other Ambulatory Visit (HOSPITAL_COMMUNITY): Payer: PRIVATE HEALTH INSURANCE

## 2014-09-13 ENCOUNTER — Ambulatory Visit (HOSPITAL_COMMUNITY)
Admission: RE | Admit: 2014-09-13 | Discharge: 2014-09-13 | Disposition: A | Discharge status: Home / Self Care | Payer: PRIVATE HEALTH INSURANCE | Source: Ambulatory Visit | Attending: General Surgery | Admitting: General Surgery

## 2014-09-13 ENCOUNTER — Other Ambulatory Visit (HOSPITAL_COMMUNITY): Payer: PRIVATE HEALTH INSURANCE

## 2014-09-13 DIAGNOSIS — K5732 Diverticulitis of large intestine without perforation or abscess without bleeding: Secondary | ICD-10-CM | POA: Diagnosis not present

## 2014-09-13 DIAGNOSIS — K5090 Crohn's disease, unspecified, without complications: Secondary | ICD-10-CM | POA: Diagnosis not present

## 2014-09-24 ENCOUNTER — Ambulatory Visit (INDEPENDENT_AMBULATORY_CARE_PROVIDER_SITE_OTHER): Payer: PRIVATE HEALTH INSURANCE | Admitting: General Surgery

## 2014-09-27 ENCOUNTER — Other Ambulatory Visit: Payer: Self-pay

## 2014-10-09 ENCOUNTER — Other Ambulatory Visit (INDEPENDENT_AMBULATORY_CARE_PROVIDER_SITE_OTHER): Payer: Self-pay | Admitting: General Surgery

## 2014-10-09 ENCOUNTER — Telehealth (INDEPENDENT_AMBULATORY_CARE_PROVIDER_SITE_OTHER): Payer: Self-pay | Admitting: *Deleted

## 2014-10-09 DIAGNOSIS — K5730 Diverticulosis of large intestine without perforation or abscess without bleeding: Secondary | ICD-10-CM

## 2014-10-09 NOTE — Telephone Encounter (Signed)
Elizabeth [name] called and states that she saw Dr. Dalbert Batman today to talk about the surgery. He advised her that maybe she needed the surgery or maybe she needed to talk with Dr.Rehman. She has lost her job due to being sick so much and is still sick. He next appointment with Dr.Ingram is 10/30/14 and he wants her to have seen Dr.Rehman prior to that. Her appointment with Dr.Rehman is not until 11/11/14. Please advise. Patient may be called on her cell [phone] or her home number.

## 2014-10-11 ENCOUNTER — Encounter (INDEPENDENT_AMBULATORY_CARE_PROVIDER_SITE_OTHER): Payer: Self-pay | Admitting: Internal Medicine

## 2014-10-14 ENCOUNTER — Encounter (HOSPITAL_COMMUNITY): Payer: Self-pay | Admitting: Internal Medicine

## 2014-10-30 ENCOUNTER — Other Ambulatory Visit (INDEPENDENT_AMBULATORY_CARE_PROVIDER_SITE_OTHER): Payer: Self-pay | Admitting: General Surgery

## 2014-11-11 ENCOUNTER — Encounter (INDEPENDENT_AMBULATORY_CARE_PROVIDER_SITE_OTHER): Payer: Self-pay | Admitting: Internal Medicine

## 2014-11-11 ENCOUNTER — Ambulatory Visit (INDEPENDENT_AMBULATORY_CARE_PROVIDER_SITE_OTHER): Payer: PRIVATE HEALTH INSURANCE | Admitting: Internal Medicine

## 2014-11-11 VITALS — BP 120/78 | HR 76 | Temp 98.1°F | Resp 18 | Ht 67.0 in | Wt 213.1 lb

## 2014-11-11 DIAGNOSIS — K589 Irritable bowel syndrome without diarrhea: Secondary | ICD-10-CM

## 2014-11-11 DIAGNOSIS — K5090 Crohn's disease, unspecified, without complications: Secondary | ICD-10-CM

## 2014-11-11 DIAGNOSIS — R112 Nausea with vomiting, unspecified: Secondary | ICD-10-CM

## 2014-11-11 DIAGNOSIS — K219 Gastro-esophageal reflux disease without esophagitis: Secondary | ICD-10-CM

## 2014-11-11 DIAGNOSIS — R110 Nausea: Secondary | ICD-10-CM

## 2014-11-11 MED ORDER — PROMETHAZINE HCL 25 MG PO TABS: 25.0000 mg | ORAL_TABLET | Freq: Two times a day (BID) | ORAL | Status: DC | PRN

## 2014-11-11 MED ORDER — DICYCLOMINE HCL 20 MG PO TABS: 20.0000 mg | ORAL_TABLET | Freq: Four times a day (QID) | ORAL | Status: DC

## 2014-11-11 NOTE — Progress Notes (Signed)
Presenting complaint;  Follow-up for Crohn's disease, diarrhea. History of recurrent sigmoid diverticulitis.  Subjective:  Patient is 53 year old Caucasian female who is here for scheduled visit. She was last seen on 07/22/2014. Since then she's been evaluated by Dr. Sigurd Sos of [facility] surgery and patient is scheduled to undergo sigmoid colon resection and first part of January 2016 for recurrent sigmoid diverticulitis. She continues to complain of diarrhea. She has 90-10 stools per day. She also has intermittent nocturnal bowel movement. She complains of bloating and excessive flatus. She denies nausea vomiting melena or rectal bleeding. Stool consistency varies from loose to semi-formed. She states she hasn't had a normal stool in 10 years. She denies abdominal pain. Her appetite is good. She drinks at least 2 L of [facility] every day. She has gained 5 pounds since her last visit. Heartburn is well controlled with therapy. She has occasional nausea but no vomiting. She is trying her best to quit cigarette smoking. Now she is using electronic cigarettes. She states she has smoked maybe 10 cigarettes in the last 1 month. She is presently not working. She does not do regular exercise.    Current Medications: Outpatient Encounter Prescriptions as of 11/11/2014  Medication Sig  . mesalamine (PENTASA) 500 MG CR capsule Take 1,000 mg by mouth 4 (four) times daily.   Marland Kitchen omeprazole (PRILOSEC) 20 MG capsule Take 2 capsules (40 mg total) by mouth daily.  . potassium chloride (MICRO-K) 10 MEQ CR capsule Take 10 mEq by mouth 2 (two) times daily.  . promethazine (PHENERGAN) 25 MG tablet Take 1 tablet (25 mg total) by mouth 2 (two) times daily as needed for nausea or vomiting.  . valsartan-hydrochlorothiazide (DIOVAN-HCT) 80-12.5 MG per tablet Take 1 tablet by mouth daily.  . [DISCONTINUED] oxyCODONE-acetaminophen (ROXICET) 5-325 MG per tablet Take 1 tablet by mouth every 4 (four) hours as  needed for moderate pain or severe pain. (Patient not taking: Reported on 11/11/2014)  . [DISCONTINUED] predniSONE (DELTASONE) 10 MG tablet Take 10 mg by mouth daily with breakfast.     Objective: Blood pressure 120/78, pulse 76, temperature 98.1 F (36.7 C), temperature source Oral, resp. rate 18, height 5' 7"  (1.702 m), weight 213 lb 1.6 oz (96.662 kg). Patient is alert and in no acute distress. Conjunctiva is pink. Sclera is nonicteric Oropharyngeal mucosa is normal. No neck masses or thyromegaly noted. Cardiac exam with regular rhythm normal S1 and S2. No murmur or gallop noted. Lungs are clear to auscultation. Abdomen is obese. It is soft and nontender without organomegaly or masses.  No LE edema or clubbing noted.  Labs/studies Results: C. difficile by PCR on 09/06/2014 was negative.    Assessment:  #1. Small bowel Crohn's disease. She appears to be in remission. #2. Postprandial diarrhea most likely secondary to IBS. She did not respond to low-dose dicyclomine. #3. History of recurrent sigmoid diverticulitis. She hasn't been evaluated by Dr. Lisabeth Register of [facility] surgery and scheduled to undergo sigmoid colon resection after holidays. #4. GERD. Symptoms well controlled with therapy. #5.  Obesity. She keeps gaining weight. She needs to improve her eating habits and quit drinking carbonated drinks.  Plan:  Dicyclomine 20 mg by mouth before meals. Patient advised to call if she has recurrent abdominal pain and/or fever. Office visit in March 2016.

## 2014-11-11 NOTE — Patient Instructions (Signed)
Notify if you have abdominal pain and fever

## 2014-12-26 NOTE — Pre-Procedure Instructions (Signed)
Elizabeth [name]  12/26/2014   Your procedure is scheduled on:  Tuesday, January 07, 2015  Report to [facility] Admitting at 5:30 AM.  Call this number if you have problems the morning of surgery: [phone]   Remember:   Do not eat food or drink liquids after midnight Monday, January 06, 2015   Take these medicines the morning of surgery with A SIP OF WATER: dicyclomine (BENTYL),mesalamine (PENTASA), omeprazole (PRILOSEC), if needed: promethazine (PHENERGAN for nausea or vomiting.  Stop taking Aspirin, vitamins, and herbal medications. Do not take any NSAIDs ie: Ibuprofen, Advil, Naproxen or any medication containing Aspirin; stop 1 week prior to procedure ( Tuesday, December 31, 2014)   Do not wear jewelry, make-up or nail polish.  Do not wear lotions, powders, or perfumes. You may not wear deodorant.  Do not shave 48 hours prior to surgery.  Do not bring valuables to the hospital.  [facility] is not responsible for any belongings or valuables.               Contacts, dentures or bridgework may not be worn into surgery.  Leave suitcase in the car. After surgery it may be brought to your room.  For patients admitted to the hospital, discharge time is determined by your treatment team.               Patients discharged the day of surgery will not be allowed to drive home.  Name and phone number of your driver:   Special Instructions:  Special Instructions:Special Instructions: [facility] - Preparing for Surgery  Before surgery, you can play an important role.  Because skin is not sterile, your skin needs to be as free of germs as possible.  You can reduce the number of germs on you skin by washing with CHG (chlorahexidine gluconate) soap before surgery.  CHG is an antiseptic cleaner which kills germs and bonds with the skin to continue killing germs even after washing.  Please DO NOT use if you have an allergy to CHG or antibacterial soaps.  If your skin becomes  reddened/irritated stop using the CHG and inform your nurse when you arrive at Short Stay.  Do not shave (including legs and underarms) for at least 48 hours prior to the first CHG shower.  You may shave your face.  Please follow these instructions carefully:   1.  Shower with CHG Soap the night before surgery and the morning of Surgery.  2.  If you choose to wash your hair, wash your hair first as usual with your normal shampoo.  3.  After you shampoo, rinse your hair and body thoroughly to remove the Shampoo.  4.  Use CHG as you would any other liquid soap.  You can apply chg directly  to the skin and wash gently with scrungie or a clean washcloth.  5.  Apply the CHG Soap to your body ONLY FROM THE NECK DOWN.  Do not use on open wounds or open sores.  Avoid contact with your eyes, ears, mouth and genitals (private parts).  Wash genitals (private parts) with your normal soap.  6.  Wash thoroughly, paying special attention to the area where your surgery will be performed.  7.  Thoroughly rinse your body with warm water from the neck down.  8.  DO NOT shower/wash with your normal soap after using and rinsing off the CHG Soap.  9.  Pat yourself dry with a clean towel.  10.  Wear clean pajamas.            11.  Place clean sheets on your bed the night of your first shower and do not sleep with pets.  Day of Surgery  Do not apply any lotions/deodorants the morning of surgery.  Please wear clean clothes to the hospital/surgery center.   Please read over the following fact sheets that you were given: Pain Booklet, Coughing and Deep Breathing and Surgical Site Infection Prevention

## 2014-12-27 ENCOUNTER — Encounter (HOSPITAL_COMMUNITY): Payer: Self-pay

## 2014-12-27 ENCOUNTER — Encounter (HOSPITAL_COMMUNITY)
Admission: RE | Admit: 2014-12-27 | Discharge: 2014-12-27 | Disposition: A | Discharge status: Home / Self Care | Payer: PRIVATE HEALTH INSURANCE | Source: Ambulatory Visit | Attending: General Surgery | Admitting: General Surgery

## 2014-12-27 DIAGNOSIS — K5790 Diverticulosis of intestine, part unspecified, without perforation or abscess without bleeding: Secondary | ICD-10-CM | POA: Insufficient documentation

## 2014-12-27 DIAGNOSIS — Z01812 Encounter for preprocedural laboratory examination: Secondary | ICD-10-CM | POA: Insufficient documentation

## 2014-12-27 DIAGNOSIS — Z01810 Encounter for preprocedural cardiovascular examination: Secondary | ICD-10-CM | POA: Diagnosis not present

## 2014-12-27 DIAGNOSIS — Z01818 Encounter for other preprocedural examination: Secondary | ICD-10-CM | POA: Diagnosis not present

## 2014-12-27 LAB — COMPREHENSIVE METABOLIC PANEL
ALBUMIN: 3.4 g/dL — AB (ref 3.5–5.2)
ALK PHOS: 65 U/L (ref 39–117)
ALT: 28 U/L (ref 0–35)
ANION GAP: 15 (ref 5–15)
AST: 26 U/L (ref 0–37)
BUN: 11 mg/dL (ref 6–23)
CHLORIDE: 102 meq/L (ref 96–112)
CO2: 23 mmol/L (ref 19–32)
Calcium: 8.7 mg/dL (ref 8.4–10.5)
Creatinine, Ser: 0.79 mg/dL (ref 0.50–1.10)
GFR calc Af Amer: >90 mL/min (ref >90)
Glucose, Bld: 95 mg/dL (ref 70–99)
POTASSIUM: 3.2 mmol/L — AB (ref 3.5–5.1)
SODIUM: 140 mmol/L (ref 135–145)
Total Bilirubin: 0.7 mg/dL (ref 0.3–1.2)
Total Protein: 6.1 g/dL (ref 6.0–8.3)

## 2014-12-27 LAB — CBC WITH DIFFERENTIAL/PLATELET
BASOS ABS: 0 10*3/uL (ref 0.0–0.1)
BASOS PCT: 0 % (ref 0–1)
EOS ABS: 0.1 10*3/uL (ref 0.0–0.7)
EOS PCT: 2 % (ref 0–5)
HCT: 42.9 % (ref 36.0–46.0)
HEMOGLOBIN: 14.9 g/dL (ref 12.0–15.0)
Lymphocytes Relative: 22 % (ref 12–46)
Lymphs Abs: 1.6 10*3/uL (ref 0.7–4.0)
MCH: 33.9 pg (ref 26.0–34.0)
MCHC: 34.7 g/dL (ref 30.0–36.0)
MCV: 97.5 fL (ref 78.0–100.0)
MONOS PCT: 8 % (ref 3–12)
Monocytes Absolute: 0.6 10*3/uL (ref 0.1–1.0)
NEUTROS PCT: 68 % (ref 43–77)
Neutro Abs: 5 10*3/uL (ref 1.7–7.7)
Platelets: 241 10*3/uL (ref 150–400)
RBC: 4.4 MIL/uL (ref 3.87–5.11)
RDW: 12.6 % (ref 11.5–15.5)
WBC: 7.4 10*3/uL (ref 4.0–10.5)

## 2014-12-27 LAB — URINALYSIS, ROUTINE W REFLEX MICROSCOPIC
Bilirubin Urine: NEGATIVE
Glucose, UA: NEGATIVE mg/dL
KETONES UR: NEGATIVE mg/dL
LEUKOCYTES UA: NEGATIVE
NITRITE: NEGATIVE
Protein, ur: NEGATIVE mg/dL
Specific Gravity, Urine: 1.019 (ref 1.005–1.030)
Urobilinogen, UA: 0.2 mg/dL (ref 0.0–1.0)
pH: 5 (ref 5.0–8.0)

## 2014-12-27 LAB — URINE MICROSCOPIC-ADD ON

## 2014-12-27 LAB — HEMOGLOBIN A1C
HEMOGLOBIN A1C: 5.1 % (ref <5.7)
MEAN PLASMA GLUCOSE: 100 mg/dL (ref <117)

## 2014-12-30 NOTE — Progress Notes (Addendum)
Anesthesia Chart Review:  Pt is 54 year old female scheduled for laparoscopic assist sigmoid colectomy on 01/07/2015 with Dr. Dalbert Batman.   PMH includes: HTN, Crohn's disease, diverticulitis, GERD. Current smoker. BMI 34.   Preoperative labs reviewed.  K 3.2.   EKG: NSR. RSR' or QR pattern in V1 suggests right ventricular conduction delay. Cannot rule out Inferior infarct , age undetermined.  Anterior infarct , age undetermined. No significant change since last tracing 01/2014.   Discussed with Dr. Orene Desanctis.   If no changes, I anticipate pt can proceed with surgery as scheduled.   Willeen Cass, FNP-BC [facility] Short Stay Surgical Center/Anesthesiology Phone: [phone] 12/30/2014 4:22 PM

## 2015-01-05 NOTE — H&P (Signed)
Elizabeth [name]  Location: [facility] Patient #: [id number] DOB: [birth year] Married / Language: English / Race: White Female       History of Present Illness  Patient words: recheck possible sigmoid chlectomy.  The patient is a 54 year old female who presents with diverticulitis. This patient returns. She is a 54 year old female with recurrent diverticulitis. She states she continues to have diarrhea, 10 stools a day. This is only slightly worse than it was since she had her Crohn's resection many years ago. This was done in [facility] Because of the significance of her diarrhea, we held off on surgery and she is to see Dr. Laural Golden at the end of this month She says she continues to have intermittent left lower quadrant pain but no fever Recent CBC shows a WBC of 8500 with normal differential. Dr. Collene Mares perform colonoscopy recently and there was no evidence of Crohn's colitis A CT scan from August of this year shows a little bit of wall thickening from the neoterminal ileum and mild diffuse colonic wall thickening of the sigmoid and a few diverticula. She underwent barium enema on September 13, 2014. She has some diverticulosis with very mild wall thickening and possible hypertonicity, not that impressive. No evidence of Crohn's disease She became asymptomatic and earlier this year on prednisone which was somewhat confusing. She admittedly has anxiety We had a very long talk today. I told her that sigmoid colectomy would probably improve her left lower quadrant pain and would most likely prevent progression of her disease. I told her that it would not alter her diarrhea at all. We're going to go ahead and tentatively schedule her for laparoscopic assisted sigmoid colectomy, possible open colectomy in January. That was her request. She was strongly urged to stop smoking She was given a bowel prep. These instructions were given today I discussed the indications, details, techniques, and  numerous risk of the surgery with her. She is aware the risk of bleeding, infection, major laparotomy, wound infection, wound hernia, temporary colostomy or ileostomy, anastomotic leak with reoperation for sepsis, injury to adjacent organs such as the ureter or bladder with major reconstructive surgery. She accepts all these risks. She understands all of these issues. All of her questions are answered. She agrees with this plan.   Vitals   Weight: 214.25 lb Height: 67in Body Surface Area: 2.14 m Body Mass Index: 33.56 kg/m Temp.: 97.66F  Pulse: 90 (Regular)  BP: 130/78 (Sitting, Left Arm, Standard)    Physical Exam  General Mental Status-Alert. General Appearance-Consistent with stated age. Hydration-Well hydrated. Voice-Normal.  Head and Neck Head-normocephalic, atraumatic with no lesions or palpable masses. Trachea-midline. Thyroid Gland Characteristics - normal size and consistency.  Eye Eyeball - Bilateral-Extraocular movements intact. Sclera/Conjunctiva - Bilateral-No scleral icterus.  Chest and Lung Exam Chest and lung exam reveals -quiet, even and easy respiratory effort with no use of accessory muscles and on auscultation, normal breath sounds, no adventitious sounds and normal vocal resonance. Inspection Chest Wall - Normal. Back - normal.  Breast Breast - Left-Symmetric, Non Tender, No Biopsy scars, no Dimpling, No Inflammation, No Lumpectomy scars, No Mastectomy scars, No Peau d' Orange. Breast - Right-Symmetric, Non Tender, No Biopsy scars, no Dimpling, No Inflammation, No Lumpectomy scars, No Mastectomy scars, No Peau d' Orange. Breast Lump-No Palpable Breast Mass.  Cardiovascular Cardiovascular examination reveals -normal heart sounds, regular rate and rhythm with no murmurs and normal pedal pulses bilaterally.  Abdomen Inspection Inspection of the abdomen reveals - No  Hernias. Palpation/Percussion Palpation and  Percussion of the abdomen reveal - Soft, No Rebound tenderness, No Rigidity (guarding) and No hepatosplenomegaly. Auscultation Auscultation of the abdomen reveals - Bowel sounds normal. Note: Well-healed Pfannenstiel incision from her previous ileocolectomy. Soft. Subjectively tender left lower quadrant with a little bit of guarding but no mass. No hernias noted   Neurologic Neurologic evaluation reveals -alert and oriented x 3 with no impairment of recent or remote memory. Mental Status-Normal.  Musculoskeletal Normal Exam - Left-Upper Extremity Strength Normal and Lower Extremity Strength Normal. Normal Exam - Right-Upper Extremity Strength Normal and Lower Extremity Strength Normal.  Lymphatic Head & Neck  General Head & Neck Lymphatics: Bilateral - Description - Normal. Axillary  General Axillary Region: Bilateral - Description - Normal. Tenderness - Non Tender. Femoral & Inguinal  Generalized Femoral & Inguinal Lymphatics: Bilateral - Description - Normal. Tenderness - Non Tender.    Assessment & Plan  DIVERTICULITIS, COLON (562.11  K57.32) Current Plans  Schedule for Surgery Because you are continuing to have left lower quadrant pain, we are going to schedule you for elective laparoscopic-assisted sigmoid colectomy you will need to undergo a bowel prep for 36 hours prior to the surgery We have discussed the risks and techniques involving this procedure in detail Your diarrhea will not get any better as a result of the surgery Keep her appointment with Dr. Laural Golden in November. It is very, very, very important that you stop smoking immediately  HISTORY OF CROHN'S DISEASE (V12.70  Z87.19)  HISTORY OF PARTIAL COLECTOMY (V45.72  Z90.49)  OBESITY (BMI 30.0-34.9) (278.00  E66.9)  ANXIETY, MILD (300.00  F41.9)  FAMILY HISTORY OF COLON CANCER (V16.0  Z80.0)  SYMPTOMS CONSISTENT WITH IRRITABLE BOWEL SYNDROME (564.1  K58.9)     Abdulrahman Bracey M. Dalbert Batman, M.D.,  [facility] General and Minimally invasive Surgery Breast and Colorectal Surgery Office:   [phone] Pager:   [phone]

## 2015-01-06 MED ORDER — CHLORHEXIDINE GLUCONATE 4 % EX LIQD
1.0000 "application " | Freq: Once | CUTANEOUS | Status: DC
  Filled 2015-01-06: qty 15

## 2015-01-06 MED ORDER — DEXTROSE 5 % IV SOLN
2.0000 g | INTRAVENOUS | Status: AC
  Administered 2015-01-07: 2 g via INTRAVENOUS
  Filled 2015-01-06 (×2): qty 2

## 2015-01-06 MED ORDER — ALVIMOPAN 12 MG PO CAPS
12.0000 mg | ORAL_CAPSULE | Freq: Once | ORAL | Status: AC
  Administered 2015-01-07: 12 mg via ORAL
  Filled 2015-01-06 (×2): qty 1

## 2015-01-07 ENCOUNTER — Inpatient Hospital Stay (HOSPITAL_COMMUNITY): Payer: PRIVATE HEALTH INSURANCE | Admitting: Certified Registered Nurse Anesthetist

## 2015-01-07 ENCOUNTER — Encounter (HOSPITAL_COMMUNITY): Payer: Self-pay | Admitting: *Deleted

## 2015-01-07 ENCOUNTER — Inpatient Hospital Stay (HOSPITAL_COMMUNITY): Payer: PRIVATE HEALTH INSURANCE | Admitting: Vascular Surgery

## 2015-01-07 ENCOUNTER — Inpatient Hospital Stay (HOSPITAL_COMMUNITY)
Admission: RE | Admit: 2015-01-07 | Discharge: 2015-01-13 | DRG: 330 | Disposition: A | Discharge status: Home / Self Care | Payer: PRIVATE HEALTH INSURANCE | Source: Ambulatory Visit | Attending: General Surgery | Admitting: General Surgery

## 2015-01-07 ENCOUNTER — Encounter (HOSPITAL_COMMUNITY)
Admission: RE | Disposition: A | Discharge status: Home / Self Care | Payer: Self-pay | Source: Ambulatory Visit | Attending: General Surgery

## 2015-01-07 DIAGNOSIS — Z9049 Acquired absence of other specified parts of digestive tract: Secondary | ICD-10-CM | POA: Diagnosis present

## 2015-01-07 DIAGNOSIS — Z6830 Body mass index (BMI) 30.0-30.9, adult: Secondary | ICD-10-CM

## 2015-01-07 DIAGNOSIS — K5792 Diverticulitis of intestine, part unspecified, without perforation or abscess without bleeding: Secondary | ICD-10-CM | POA: Diagnosis present

## 2015-01-07 DIAGNOSIS — F17200 Nicotine dependence, unspecified, uncomplicated: Secondary | ICD-10-CM | POA: Diagnosis present

## 2015-01-07 DIAGNOSIS — Z800 Family history of malignant neoplasm of digestive organs: Secondary | ICD-10-CM | POA: Diagnosis not present

## 2015-01-07 DIAGNOSIS — K5090 Crohn's disease, unspecified, without complications: Secondary | ICD-10-CM | POA: Diagnosis present

## 2015-01-07 DIAGNOSIS — L299 Pruritus, unspecified: Secondary | ICD-10-CM | POA: Diagnosis not present

## 2015-01-07 DIAGNOSIS — K5732 Diverticulitis of large intestine without perforation or abscess without bleeding: Principal | ICD-10-CM | POA: Diagnosis present

## 2015-01-07 DIAGNOSIS — E669 Obesity, unspecified: Secondary | ICD-10-CM | POA: Diagnosis present

## 2015-01-07 DIAGNOSIS — T402X5A Adverse effect of other opioids, initial encounter: Secondary | ICD-10-CM | POA: Diagnosis not present

## 2015-01-07 DIAGNOSIS — F419 Anxiety disorder, unspecified: Secondary | ICD-10-CM | POA: Diagnosis present

## 2015-01-07 HISTORY — PX: LAPAROSCOPIC SIGMOID COLECTOMY: SHX5928

## 2015-01-07 LAB — BASIC METABOLIC PANEL
ANION GAP: 9 (ref 5–15)
BUN: 8 mg/dL (ref 6–23)
CALCIUM: 8.6 mg/dL (ref 8.4–10.5)
CHLORIDE: 106 mmol/L (ref 96–112)
CO2: 24 mmol/L (ref 19–32)
Creatinine, Ser: 1.07 mg/dL (ref 0.50–1.10)
GFR calc Af Amer: 66 mL/min — ABNORMAL LOW (ref >90)
GFR, EST NON AFRICAN AMERICAN: 57 mL/min — AB (ref >90)
Glucose, Bld: 126 mg/dL — ABNORMAL HIGH (ref 70–99)
POTASSIUM: 3.7 mmol/L (ref 3.5–5.1)
Sodium: 139 mmol/L (ref 135–145)

## 2015-01-07 LAB — CBC
HEMATOCRIT: 45 % (ref 36.0–46.0)
Hemoglobin: 15.6 g/dL — ABNORMAL HIGH (ref 12.0–15.0)
MCH: 33.8 pg (ref 26.0–34.0)
MCHC: 34.7 g/dL (ref 30.0–36.0)
MCV: 97.6 fL (ref 78.0–100.0)
Platelets: 203 10*3/uL (ref 150–400)
RBC: 4.61 MIL/uL (ref 3.87–5.11)
RDW: 12.7 % (ref 11.5–15.5)
WBC: 15 10*3/uL — ABNORMAL HIGH (ref 4.0–10.5)

## 2015-01-07 LAB — GLUCOSE, CAPILLARY: Glucose-Capillary: 137 mg/dL — ABNORMAL HIGH (ref 70–99)

## 2015-01-07 LAB — TYPE AND SCREEN
ABO/RH(D): O NEG
Antibody Screen: NEGATIVE

## 2015-01-07 LAB — ABO/RH: ABO/RH(D): O NEG

## 2015-01-07 SURGERY — COLECTOMY, SIGMOID, LAPAROSCOPIC
Anesthesia: General | Site: Abdomen

## 2015-01-07 MED ORDER — DEXTROSE 5 % IV SOLN
2.0000 g | Freq: Two times a day (BID) | INTRAVENOUS | Status: AC
  Administered 2015-01-07: 2 g via INTRAVENOUS
  Filled 2015-01-07: qty 2

## 2015-01-07 MED ORDER — SUCCINYLCHOLINE CHLORIDE 20 MG/ML IJ SOLN
INTRAMUSCULAR | Status: AC
  Filled 2015-01-07: qty 1

## 2015-01-07 MED ORDER — ONDANSETRON HCL 4 MG/2ML IJ SOLN
INTRAMUSCULAR | Status: DC | PRN
  Administered 2015-01-07: 4 mg via INTRAVENOUS

## 2015-01-07 MED ORDER — NEOSTIGMINE METHYLSULFATE 10 MG/10ML IV SOLN
INTRAVENOUS | Status: DC | PRN
  Administered 2015-01-07: 5 mg via INTRAVENOUS

## 2015-01-07 MED ORDER — ONDANSETRON HCL 4 MG/2ML IJ SOLN: 4.0000 mg | Freq: Four times a day (QID) | INTRAMUSCULAR | Status: DC | PRN

## 2015-01-07 MED ORDER — PROMETHAZINE HCL 25 MG/ML IJ SOLN: 6.2500 mg | INTRAMUSCULAR | Status: DC | PRN

## 2015-01-07 MED ORDER — LACTATED RINGERS IV SOLN
INTRAVENOUS | Status: DC
  Administered 2015-01-07: 07:00:00 via INTRAVENOUS

## 2015-01-07 MED ORDER — SODIUM CHLORIDE 0.9 % IR SOLN
Status: DC | PRN
  Administered 2015-01-07: 1000 mL

## 2015-01-07 MED ORDER — ROCURONIUM BROMIDE 100 MG/10ML IV SOLN
INTRAVENOUS | Status: DC | PRN
  Administered 2015-01-07: 20 mg via INTRAVENOUS
  Administered 2015-01-07: 10 mg via INTRAVENOUS
  Administered 2015-01-07: 30 mg via INTRAVENOUS
  Administered 2015-01-07: 50 mg via INTRAVENOUS

## 2015-01-07 MED ORDER — ROCURONIUM BROMIDE 50 MG/5ML IV SOLN
INTRAVENOUS | Status: AC
  Filled 2015-01-07: qty 1

## 2015-01-07 MED ORDER — ALVIMOPAN 12 MG PO CAPS
12.0000 mg | ORAL_CAPSULE | Freq: Two times a day (BID) | ORAL | Status: DC
  Administered 2015-01-08 – 2015-01-11 (×7): 12 mg via ORAL
  Filled 2015-01-07 (×11): qty 1

## 2015-01-07 MED ORDER — OXYCODONE HCL 5 MG/5ML PO SOLN: 5.0000 mg | Freq: Once | ORAL | Status: DC | PRN

## 2015-01-07 MED ORDER — STERILE WATER FOR INJECTION IJ SOLN
INTRAMUSCULAR | Status: AC
  Filled 2015-01-07: qty 10

## 2015-01-07 MED ORDER — SURGILUBE EX GEL
CUTANEOUS | Status: DC | PRN
  Administered 2015-01-07: 1 via TOPICAL

## 2015-01-07 MED ORDER — ALBUMIN HUMAN 5 % IV SOLN
INTRAVENOUS | Status: DC | PRN
  Administered 2015-01-07: 09:00:00 via INTRAVENOUS

## 2015-01-07 MED ORDER — PHENYLEPHRINE HCL 10 MG/ML IJ SOLN
INTRAMUSCULAR | Status: DC | PRN
  Administered 2015-01-07 (×5): 80 ug via INTRAVENOUS

## 2015-01-07 MED ORDER — SUCCINYLCHOLINE CHLORIDE 20 MG/ML IJ SOLN
INTRAMUSCULAR | Status: DC | PRN
  Administered 2015-01-07: 160 mg via INTRAVENOUS

## 2015-01-07 MED ORDER — BUPIVACAINE-EPINEPHRINE (PF) 0.5% -1:200000 IJ SOLN
INTRAMUSCULAR | Status: AC
  Filled 2015-01-07: qty 30

## 2015-01-07 MED ORDER — BUPIVACAINE-EPINEPHRINE 0.5% -1:200000 IJ SOLN
INTRAMUSCULAR | Status: DC | PRN
  Administered 2015-01-07: 14 mL

## 2015-01-07 MED ORDER — GLYCOPYRROLATE 0.2 MG/ML IJ SOLN
INTRAMUSCULAR | Status: AC
  Filled 2015-01-07: qty 5

## 2015-01-07 MED ORDER — PROPOFOL 10 MG/ML IV BOLUS
INTRAVENOUS | Status: AC
  Filled 2015-01-07: qty 20

## 2015-01-07 MED ORDER — LACTATED RINGERS IV SOLN
INTRAVENOUS | Status: DC | PRN
  Administered 2015-01-07 (×3): via INTRAVENOUS

## 2015-01-07 MED ORDER — FENTANYL CITRATE 0.05 MG/ML IJ SOLN
INTRAMUSCULAR | Status: AC
  Filled 2015-01-07: qty 5

## 2015-01-07 MED ORDER — DIPHENHYDRAMINE HCL 50 MG/ML IJ SOLN
12.5000 mg | Freq: Four times a day (QID) | INTRAMUSCULAR | Status: DC | PRN
  Administered 2015-01-08: 12.5 mg via INTRAVENOUS
  Filled 2015-01-07: qty 1
  Filled 2015-01-07: qty 0.25

## 2015-01-07 MED ORDER — GLYCOPYRROLATE 0.2 MG/ML IJ SOLN
INTRAMUSCULAR | Status: DC | PRN
  Administered 2015-01-07: .9 mg via INTRAVENOUS
  Administered 2015-01-07: 0.1 mg via INTRAVENOUS

## 2015-01-07 MED ORDER — DIPHENHYDRAMINE HCL 12.5 MG/5ML PO ELIX
12.5000 mg | ORAL_SOLUTION | Freq: Four times a day (QID) | ORAL | Status: DC | PRN
  Administered 2015-01-09: 12.5 mg via ORAL
  Filled 2015-01-07: qty 10
  Filled 2015-01-07: qty 5

## 2015-01-07 MED ORDER — ONDANSETRON HCL 4 MG PO TABS: 4.0000 mg | ORAL_TABLET | Freq: Four times a day (QID) | ORAL | Status: DC | PRN

## 2015-01-07 MED ORDER — POTASSIUM CHLORIDE IN NACL 20-0.9 MEQ/L-% IV SOLN
INTRAVENOUS | Status: DC
  Administered 2015-01-07 – 2015-01-11 (×8): via INTRAVENOUS
  Filled 2015-01-07 (×17): qty 1000

## 2015-01-07 MED ORDER — POTASSIUM CHLORIDE CRYS ER 20 MEQ PO TBCR
20.0000 meq | EXTENDED_RELEASE_TABLET | Freq: Two times a day (BID) | ORAL | Status: DC
Start: 2015-01-07 — End: 2015-01-13
  Administered 2015-01-07 – 2015-01-13 (×11): 20 meq via ORAL
  Filled 2015-01-07 (×17): qty 1

## 2015-01-07 MED ORDER — HYDROMORPHONE HCL 1 MG/ML IJ SOLN
0.2500 mg | INTRAMUSCULAR | Status: DC | PRN
  Administered 2015-01-07 (×4): 0.5 mg via INTRAVENOUS

## 2015-01-07 MED ORDER — PROPOFOL 10 MG/ML IV BOLUS
INTRAVENOUS | Status: DC | PRN
  Administered 2015-01-07: 200 mg via INTRAVENOUS

## 2015-01-07 MED ORDER — ONDANSETRON HCL 4 MG/2ML IJ SOLN
4.0000 mg | Freq: Once | INTRAMUSCULAR | Status: AC
  Administered 2015-01-07: 4 mg via INTRAVENOUS

## 2015-01-07 MED ORDER — NALOXONE HCL 0.4 MG/ML IJ SOLN
0.4000 mg | INTRAMUSCULAR | Status: DC | PRN
  Filled 2015-01-07: qty 1

## 2015-01-07 MED ORDER — HYDROMORPHONE HCL 1 MG/ML IJ SOLN
INTRAMUSCULAR | Status: AC
  Filled 2015-01-07: qty 1

## 2015-01-07 MED ORDER — HYDROMORPHONE 0.3 MG/ML IV SOLN
INTRAVENOUS | Status: AC
  Filled 2015-01-07: qty 25

## 2015-01-07 MED ORDER — LIDOCAINE HCL (CARDIAC) 20 MG/ML IV SOLN
INTRAVENOUS | Status: DC | PRN
  Administered 2015-01-07: 80 mg via INTRAVENOUS

## 2015-01-07 MED ORDER — 0.9 % SODIUM CHLORIDE (POUR BTL) OPTIME
TOPICAL | Status: DC | PRN
  Administered 2015-01-07 (×3): 1000 mL

## 2015-01-07 MED ORDER — FENTANYL CITRATE 0.05 MG/ML IJ SOLN
INTRAMUSCULAR | Status: DC | PRN
  Administered 2015-01-07: 100 ug via INTRAVENOUS
  Administered 2015-01-07: 150 ug via INTRAVENOUS
  Administered 2015-01-07 (×5): 50 ug via INTRAVENOUS

## 2015-01-07 MED ORDER — DICYCLOMINE HCL 20 MG PO TABS
20.0000 mg | ORAL_TABLET | Freq: Four times a day (QID) | ORAL | Status: DC
Start: 2015-01-07 — End: 2015-01-13
  Administered 2015-01-09 – 2015-01-13 (×9): 20 mg via ORAL
  Filled 2015-01-07 (×28): qty 1

## 2015-01-07 MED ORDER — MIDAZOLAM HCL 2 MG/2ML IJ SOLN
INTRAMUSCULAR | Status: AC
  Filled 2015-01-07: qty 2

## 2015-01-07 MED ORDER — ONDANSETRON HCL 4 MG/2ML IJ SOLN
INTRAMUSCULAR | Status: AC
  Filled 2015-01-07: qty 2

## 2015-01-07 MED ORDER — MESALAMINE ER 250 MG PO CPCR
1000.0000 mg | ORAL_CAPSULE | Freq: Four times a day (QID) | ORAL | Status: DC
  Administered 2015-01-09 – 2015-01-13 (×9): 1000 mg via ORAL
  Filled 2015-01-07 (×27): qty 4

## 2015-01-07 MED ORDER — HYDROMORPHONE 0.3 MG/ML IV SOLN
INTRAVENOUS | Status: DC
  Administered 2015-01-07 (×2): 0.3 mg via INTRAVENOUS
  Administered 2015-01-08: 10:00:00 via INTRAVENOUS
  Administered 2015-01-08: 4.8 mg via INTRAVENOUS
  Administered 2015-01-08: 01:00:00 via INTRAVENOUS
  Administered 2015-01-08: 5.1 mg via INTRAVENOUS
  Administered 2015-01-08: 22:00:00 via INTRAVENOUS
  Administered 2015-01-09: 3.6 mg via INTRAVENOUS
  Filled 2015-01-07 (×3): qty 25

## 2015-01-07 MED ORDER — BUPIVACAINE-EPINEPHRINE (PF) 0.25% -1:200000 IJ SOLN
INTRAMUSCULAR | Status: AC
  Filled 2015-01-07: qty 30

## 2015-01-07 MED ORDER — ONDANSETRON HCL 4 MG/2ML IJ SOLN
INTRAMUSCULAR | Status: AC
  Administered 2015-01-07: 4 mg via INTRAVENOUS
  Filled 2015-01-07: qty 2

## 2015-01-07 MED ORDER — PHENYLEPHRINE HCL 10 MG/ML IJ SOLN
10.0000 mg | INTRAVENOUS | Status: DC | PRN
  Administered 2015-01-07: 40 ug/min via INTRAVENOUS

## 2015-01-07 MED ORDER — DIPHENHYDRAMINE HCL 50 MG/ML IJ SOLN
INTRAMUSCULAR | Status: DC | PRN
  Administered 2015-01-07: 12.5 mg via INTRAVENOUS

## 2015-01-07 MED ORDER — DEXAMETHASONE SODIUM PHOSPHATE 4 MG/ML IJ SOLN
INTRAMUSCULAR | Status: AC
  Filled 2015-01-07: qty 1

## 2015-01-07 MED ORDER — OXYCODONE HCL 5 MG PO TABS: 5.0000 mg | ORAL_TABLET | Freq: Once | ORAL | Status: DC | PRN

## 2015-01-07 MED ORDER — NEOSTIGMINE METHYLSULFATE 10 MG/10ML IV SOLN
INTRAVENOUS | Status: AC
  Filled 2015-01-07: qty 2

## 2015-01-07 MED ORDER — ENOXAPARIN SODIUM 40 MG/0.4ML ~~LOC~~ SOLN
40.0000 mg | SUBCUTANEOUS | Status: DC
  Administered 2015-01-08 – 2015-01-13 (×6): 40 mg via SUBCUTANEOUS
  Filled 2015-01-07 (×9): qty 0.4

## 2015-01-07 MED ORDER — DEXAMETHASONE SODIUM PHOSPHATE 4 MG/ML IJ SOLN
INTRAMUSCULAR | Status: DC | PRN
  Administered 2015-01-07: 4 mg via INTRAVENOUS

## 2015-01-07 MED ORDER — PROMETHAZINE HCL 25 MG PO TABS
25.0000 mg | ORAL_TABLET | Freq: Two times a day (BID) | ORAL | Status: DC | PRN
  Filled 2015-01-07: qty 1

## 2015-01-07 MED ORDER — PANTOPRAZOLE SODIUM 40 MG PO TBEC
40.0000 mg | DELAYED_RELEASE_TABLET | Freq: Every day | ORAL | Status: DC
  Administered 2015-01-07 – 2015-01-13 (×7): 40 mg via ORAL
  Filled 2015-01-07 (×5): qty 1

## 2015-01-07 MED ORDER — EPHEDRINE SULFATE 50 MG/ML IJ SOLN
INTRAMUSCULAR | Status: AC
  Filled 2015-01-07: qty 1

## 2015-01-07 MED ORDER — MIDAZOLAM HCL 5 MG/5ML IJ SOLN
INTRAMUSCULAR | Status: DC | PRN
  Administered 2015-01-07: 2 mg via INTRAVENOUS

## 2015-01-07 MED ORDER — PHENYLEPHRINE 40 MCG/ML (10ML) SYRINGE FOR IV PUSH (FOR BLOOD PRESSURE SUPPORT)
PREFILLED_SYRINGE | INTRAVENOUS | Status: AC
  Filled 2015-01-07: qty 10

## 2015-01-07 MED ORDER — LIDOCAINE HCL (CARDIAC) 20 MG/ML IV SOLN
INTRAVENOUS | Status: AC
  Filled 2015-01-07: qty 5

## 2015-01-07 MED ORDER — SCOPOLAMINE 1 MG/3DAYS TD PT72
1.0000 | MEDICATED_PATCH | TRANSDERMAL | Status: DC
  Administered 2015-01-07: 1 via TRANSDERMAL

## 2015-01-07 MED ORDER — DIPHENHYDRAMINE HCL 50 MG/ML IJ SOLN
INTRAMUSCULAR | Status: AC
  Filled 2015-01-07: qty 1

## 2015-01-07 MED ORDER — ARTIFICIAL TEARS OP OINT
TOPICAL_OINTMENT | OPHTHALMIC | Status: AC
  Filled 2015-01-07: qty 3.5

## 2015-01-07 MED ORDER — SODIUM CHLORIDE 0.9 % IJ SOLN: 9.0000 mL | INTRAMUSCULAR | Status: DC | PRN

## 2015-01-07 MED ORDER — SCOPOLAMINE 1 MG/3DAYS TD PT72
MEDICATED_PATCH | TRANSDERMAL | Status: AC
  Filled 2015-01-07: qty 1

## 2015-01-07 MED ORDER — ARTIFICIAL TEARS OP OINT
TOPICAL_OINTMENT | OPHTHALMIC | Status: DC | PRN
  Administered 2015-01-07: 1 via OPHTHALMIC

## 2015-01-07 MED ORDER — GLYCOPYRROLATE 0.2 MG/ML IJ SOLN
INTRAMUSCULAR | Status: AC
  Filled 2015-01-07: qty 2

## 2015-01-07 SURGICAL SUPPLY — 93 items
APPLIER CLIP 5 13 M/L LIGAMAX5 (MISCELLANEOUS)
APPLIER CLIP ROT 10 11.4 M/L (STAPLE)
APR CLP MED LRG 11.4X10 (STAPLE)
APR CLP MED LRG 5 ANG JAW (MISCELLANEOUS)
BLADE SURG ROTATE 9660 (MISCELLANEOUS) ×2 IMPLANT
CANISTER SUCTION 2500CC (MISCELLANEOUS) ×5 IMPLANT
CELLS DAT CNTRL 66122 CELL SVR (MISCELLANEOUS) IMPLANT
CHLORAPREP W/TINT 26ML (MISCELLANEOUS) ×3 IMPLANT
CLIP APPLIE 5 13 M/L LIGAMAX5 (MISCELLANEOUS) IMPLANT
CLIP APPLIE ROT 10 11.4 M/L (STAPLE) IMPLANT
COVER MAYO STAND STRL (DRAPES) ×4 IMPLANT
COVER SURGICAL LIGHT HANDLE (MISCELLANEOUS) ×6 IMPLANT
DRAPE LAPAROSCOPIC ABDOMINAL (DRAPES) ×3 IMPLANT
DRAPE PROXIMA HALF (DRAPES) ×5 IMPLANT
DRAPE UTILITY XL STRL (DRAPES) ×9 IMPLANT
DRAPE WARM FLUID 44X44 (DRAPE) ×3 IMPLANT
DRESSING ALLEVYN LIFE SACRUM (GAUZE/BANDAGES/DRESSINGS) ×2 IMPLANT
DRSG OPSITE POSTOP 3X4 (GAUZE/BANDAGES/DRESSINGS) ×2 IMPLANT
DRSG OPSITE POSTOP 4X10 (GAUZE/BANDAGES/DRESSINGS) ×2 IMPLANT
DRSG OPSITE POSTOP 4X8 (GAUZE/BANDAGES/DRESSINGS) IMPLANT
DRSG TEGADERM 2-3/8X2-3/4 SM (GAUZE/BANDAGES/DRESSINGS) ×6 IMPLANT
ELECT BLADE 6.5 EXT (BLADE) ×3 IMPLANT
ELECT CAUTERY BLADE 6.4 (BLADE) ×6 IMPLANT
ELECT REM PT RETURN 9FT ADLT (ELECTROSURGICAL) ×3
ELECTRODE REM PT RTRN 9FT ADLT (ELECTROSURGICAL) ×1 IMPLANT
GAUZE SPONGE 2X2 8PLY STRL LF (GAUZE/BANDAGES/DRESSINGS) ×1 IMPLANT
GEL ULTRASOUND 20GR AQUASONIC (MISCELLANEOUS) ×2 IMPLANT
GLOVE BIO SURGEON STRL SZ 6.5 (GLOVE) ×2 IMPLANT
GLOVE BIO SURGEON STRL SZ7 (GLOVE) ×10 IMPLANT
GLOVE BIO SURGEON STRL SZ7.5 (GLOVE) ×12 IMPLANT
GLOVE BIO SURGEONS STRL SZ 6.5 (GLOVE) ×2
GLOVE BIOGEL PI IND STRL 7.0 (GLOVE) IMPLANT
GLOVE BIOGEL PI IND STRL 7.5 (GLOVE) IMPLANT
GLOVE BIOGEL PI IND STRL 8 (GLOVE) IMPLANT
GLOVE BIOGEL PI INDICATOR 7.0 (GLOVE) ×14
GLOVE BIOGEL PI INDICATOR 7.5 (GLOVE) ×2
GLOVE BIOGEL PI INDICATOR 8 (GLOVE) ×12
GLOVE EUDERMIC 7 POWDERFREE (GLOVE) ×6 IMPLANT
GLOVE SURG SS PI 7.5 STRL IVOR (GLOVE) ×2 IMPLANT
GOWN STRL REUS W/ TWL LRG LVL3 (GOWN DISPOSABLE) ×6 IMPLANT
GOWN STRL REUS W/ TWL XL LVL3 (GOWN DISPOSABLE) ×2 IMPLANT
GOWN STRL REUS W/TWL LRG LVL3 (GOWN DISPOSABLE) ×12
GOWN STRL REUS W/TWL XL LVL3 (GOWN DISPOSABLE) ×21
KIT BASIN OR (CUSTOM PROCEDURE TRAY) ×3 IMPLANT
KIT ROOM TURNOVER OR (KITS) ×3 IMPLANT
LEGGING LITHOTOMY PAIR STRL (DRAPES) ×3 IMPLANT
LIGASURE IMPACT 36 18CM CVD LR (INSTRUMENTS) ×2 IMPLANT
NS IRRIG 1000ML POUR BTL (IV SOLUTION) ×6 IMPLANT
PAD ARMBOARD 7.5X6 YLW CONV (MISCELLANEOUS) ×6 IMPLANT
PENCIL BUTTON HOLSTER BLD 10FT (ELECTRODE) ×6 IMPLANT
RETRACTOR WND ALEXIS 18 MED (MISCELLANEOUS) IMPLANT
RTRCTR WOUND ALEXIS 18CM MED (MISCELLANEOUS)
SCALPEL HARMONIC ACE (MISCELLANEOUS) ×3 IMPLANT
SCISSORS LAP 5X35 DISP (ENDOMECHANICALS) ×3 IMPLANT
SET IRRIG TUBING LAPAROSCOPIC (IRRIGATION / IRRIGATOR) ×2 IMPLANT
SLEEVE ENDOPATH XCEL 5M (ENDOMECHANICALS) ×7 IMPLANT
SLEEVE SURGEON STRL (DRAPES) ×2 IMPLANT
SPECIMEN JAR LARGE (MISCELLANEOUS) ×1 IMPLANT
SPECIMEN JAR X LARGE (MISCELLANEOUS) ×2 IMPLANT
SPONGE GAUZE 2X2 STER 10/PKG (GAUZE/BANDAGES/DRESSINGS) ×2
SPONGE LAP 18X18 X RAY DECT (DISPOSABLE) ×6 IMPLANT
STAPLER CIRC CVD 29MM 37CM (STAPLE) ×2 IMPLANT
STAPLER CUT CVD 40MM BLUE (STAPLE) ×2 IMPLANT
STAPLER PROXIMATE 75MM BLUE (STAPLE) ×2 IMPLANT
STAPLER VISISTAT 35W (STAPLE) ×3 IMPLANT
SURGILUBE 2OZ TUBE FLIPTOP (MISCELLANEOUS) ×3 IMPLANT
SUT PDS AB 1 TP1 96 (SUTURE) ×4 IMPLANT
SUT PROLENE 0 SH 30 (SUTURE) ×2 IMPLANT
SUT PROLENE 2 0 CT2 30 (SUTURE) IMPLANT
SUT PROLENE 2 0 KS (SUTURE) IMPLANT
SUT PROLENE 2 0 SH 30 (SUTURE) ×4 IMPLANT
SUT SILK 2 0 (SUTURE) ×3
SUT SILK 2 0 SH CR/8 (SUTURE) ×3 IMPLANT
SUT SILK 2-0 18XBRD TIE 12 (SUTURE) ×1 IMPLANT
SUT SILK 3 0 (SUTURE) ×3
SUT SILK 3 0 SH CR/8 (SUTURE) ×3 IMPLANT
SUT SILK 3-0 18XBRD TIE 12 (SUTURE) ×1 IMPLANT
SYR BULB IRRIGATION 50ML (SYRINGE) ×3 IMPLANT
SYS LAPSCP GELPORT 120MM (MISCELLANEOUS)
SYSTEM LAPSCP GELPORT 120MM (MISCELLANEOUS) IMPLANT
TOWEL OR 17X26 10 PK STRL BLUE (TOWEL DISPOSABLE) ×6 IMPLANT
TRAY FOLEY CATH 16FRSI W/METER (SET/KITS/TRAYS/PACK) ×3 IMPLANT
TRAY LAPAROSCOPIC (CUSTOM PROCEDURE TRAY) ×3 IMPLANT
TRAY PROCTOSCOPIC FIBER OPTIC (SET/KITS/TRAYS/PACK) ×3 IMPLANT
TROCAR XCEL 12X100 BLDLESS (ENDOMECHANICALS) IMPLANT
TROCAR XCEL BLUNT TIP 100MML (ENDOMECHANICALS) ×2 IMPLANT
TROCAR XCEL NON-BLD 11X100MML (ENDOMECHANICALS) ×2 IMPLANT
TROCAR XCEL NON-BLD 5MMX100MML (ENDOMECHANICALS) ×3 IMPLANT
TUBE CONNECTING 12'X1/4 (SUCTIONS) ×2
TUBE CONNECTING 12X1/4 (SUCTIONS) ×4 IMPLANT
TUBING FILTER THERMOFLATOR (ELECTROSURGICAL) ×3 IMPLANT
TUBING INSUFFLATION (TUBING) ×1 IMPLANT
YANKAUER SUCT BULB TIP NO VENT (SUCTIONS) ×6 IMPLANT

## 2015-01-07 NOTE — Anesthesia Postprocedure Evaluation (Signed)
Anesthesia Post Note  Patient: Elizabeth [name]  Procedure(s) Performed: Procedure(s) (LRB): LOW ANTERIOR COLON RESECTION (N/A)  Anesthesia type: general  Patient location: PACU  Post pain: Pain level controlled  Post assessment: Patient's Cardiovascular Status Stable  Post vital signs: Reviewed and stable  Level of consciousness: sedated  Complications: No apparent anesthesia complications

## 2015-01-07 NOTE — Progress Notes (Signed)
Patient arrived and reports severe nausea. Notified Dr. Glennon Mac orders given for zofran. Patient reports that nausea is getting better.

## 2015-01-07 NOTE — Transfer of Care (Signed)
Immediate Anesthesia Transfer of Care Note  Patient: Elizabeth [name]  Procedure(s) Performed: Procedure(s): LOW ANTERIOR COLON RESECTION (N/A)  Patient Location: PACU  Anesthesia Type:General  Level of Consciousness: awake and alert   Airway & Oxygen Therapy: Patient Spontanous Breathing and Patient connected to face mask oxygen  Post-op Assessment: Report given to PACU RN, Post -op Vital signs reviewed and stable and Patient moving all extremities X 4  Post vital signs: Reviewed and stable  Complications: No apparent anesthesia complications

## 2015-01-07 NOTE — Op Note (Signed)
Patient Name:           Elizabeth [name]   Date of Surgery:        01/07/2015  Pre op Diagnosis:      Diverticulitis of sigmoid colon  Post op Diagnosis:    Same  Procedure:                 Laparoscopic lysis of adhesions, open low anterior resection with coloproctostomy, 29 mm EEA stapler  Surgeon:                     Edsel Petrin. Dalbert Batman, M.D., FACS  Assistant:                      Ralene Ok, M.D., FACS  Operative Indications:   . She is a 54 year old female with recurrent diverticulitis. She states she continues to have diarrhea, 10 stools a day. This is only slightly worse than it was since she had her ileocecal Crohn's resection many years ago. This was done in [place].    She says she continues to have intermittent left lower quadrant pain but no fever Recent CBC shows a WBC of 8500 with normal differential. Dr. Laural Golden performed colonoscopy recently and there was no evidence of Crohn's colitis A CT scan from August of this year shows a little bit of wall thickening from the neoterminal ileum and mild diffuse colonic wall thickening of the sigmoid and a few diverticula. She underwent barium enema on September 13, 2014. She has some diverticulosis with very mild wall thickening and possible hypertonicity, not that impressive. 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 I told her that it would not alter her diarrhea at all. We're going to  schedule her for laparoscopic assisted sigmoid colectomy, possible open colectomy.That was her request. She was strongly urged to stop smoking she underwent a bowel prep at home. She says she took all of the prep but had some nausea and  vomiting.  Operative Findings:       There were extensive adhesions, left upper quadrant paracolic gutter, omental in the pelvis, lower midline incision. The sigmoid colon was markedly thickened with a mass effect, very hard and very stuck to the left lateral pelvic sidewall. We took down most of the adhesions laparoscopically, but found that we could not safely mobilize the colon because of dense adherence to the retroperitoneum. A lower midline incision was necessary to complete the case. The anastomosis was performed between the distal descending colon and the mid rectum at the peritoneal reflection with a 29 mm EEA stapler. There was good blood supply and no tension on the anastomosis. Proximal colon was somewhat thick-walled diffusely but was soft without evidence of diverticulitis.  Procedure in Detail:          Following the induction of general endotracheal anesthesia a Foley catheter was placed and the patient was placed in the rigid stirrups in a modified lithotomy position. The entire abdomen and perineum were prepped and draped in a sterile fashion. Intravenous antibiotics were given. Surgical timeout was performed. 0.5% Marcaine with epinephrine was used as a local infiltration anesthetic.      An 11 mm Hassan trocar was  inserted in the midline just above the umbilicus with an open technique. Entry was uneventful. Pneumoperitoneum was created. Video camera was inserted with visualization of extensive adhesions as described above. Ultimately placed a 5 mm trocar in the right abdomen, left abdomen, suprapubic area, and epigastrium. We took all the adhesions down under direct vision first in the midline, then the pelvis then the left paracolic gutter. We mobilized the descending colon all the way up to the splenic flexure mobilizing it medially. When we got down to the sigmoid colon the left tube and ovary were densely adherent to the colon these were dissected off. We could not handle the colon with  laparoscopic instruments.    We released the pneumoperitoneum and removed the trocars. Lower midline incision was made. The abdomen was entered under direct vision in the midline. A Bookwalter retractor was placed. We further mobilized the descending colon and it  was quite soft. The proximal sigmoid colon was quite mobile. The mid and distal sigmoid colon were very thickened, almost the size of a grapefruit. We incised the mesentery of the rectosigmoid medially and laterally. We mobilized the colon up off of the retroperitoneum. We identified the left ureter and preserved it. We identified the left gonadal vessels and preserved them.. We had to take the dissection all the way down to the peritoneal reflection to get adequate resection of all the diverticulosis present. The proximal colon was transected at a soft normal looking area with a GIA stapling device. The mesentery of the sigmoid and rectum was taken down with the LigaSure, staying fairly close to the colon. We continued dissection down into the pelvis to the level of the peritoneal reflection and mobilized the mid rectum somewhat. We transected the mid rectum with a contour stapler with a blue load. We marked the proximal end of the specimen with a silk suture and sent it to the lab for pathologic exam. We irrigated extensively. Hemostasis was good.    The proximal colon was opened by amputating the staple line. The colon the mucosa was pink and well vascularized. We dilated up to 29 mm size but it would not dilate any further. The muscular wall is somewhat thickened. I placed a pursestring suture of 0 Prolene in the proximal colon and then put the anvil of the 29 mm EEA stapler in there and tied the pursestring down. I placed another suture of Prolene to tighten up the pursestring and that looked good. I debrided a little bit of the fatty tissue.      Dr. Rosendo Gros passed the stapler up through the anus up to the staple line in the mid rectum. That went  well and he opened the stapler and the spike came out nicely. The proximal colon anvil was secured to the stapler spike being careful not to twist the proximal colon. The stapler was closed, fired, held for 30 seconds, released and removed. We had 2 complete donuts of tissue. There was no bleeding. Proctoscopy was performed and the anastomosis showed no bleeding. We were able to insufflate the colon above the anastomosis and there were no air bubbles under saline. We evacuated the air and removed the proctoscope.     We irrigated out the abdomen and pelvis extensively. We then changed our instruments and drapes, gowns and gloves. We irrigated one more time. There is no sign of bleeding. The omentum was brought down and came down nicely and completely covered the undersurface of the incision. The midline fascia was closed  with a running suture of #1 double strand PDS. After irrigating the subcutaneous tissue we closed the wounds with staples and placed some Telfa wicks between the gaps for drainage. The remaining trocar sites were closed the skin staple. Honeycomb dressing was placed. the patient tolerated procedure well was taken to PACU in stable condition. EBL 150 mL or less. Counts correct. Complications none.                Edsel Petrin. Dalbert Batman, M.D., FACS General and Minimally Invasive Surgery Breast and Colorectal Surgery  01/07/2015 10:41 AM

## 2015-01-07 NOTE — Progress Notes (Signed)
UR completed.  Gurjit Loconte, RN BSN MHA CCM Trauma/Neuro ICU Case Manager [phone]  

## 2015-01-07 NOTE — Anesthesia Preprocedure Evaluation (Signed)
Anesthesia Evaluation  Patient identified by MRN, date of birth, ID band Patient awake    Reviewed: Allergy & Precautions, NPO status , Patient's Chart, lab work & pertinent test results  History of Anesthesia Complications Negative for: history of anesthetic complications  Airway Mallampati: II  TM Distance: >3 FB     Dental  (+) Teeth Intact, Dental Advisory Given   Pulmonary Current Smoker,    Pulmonary exam normal       Cardiovascular hypertension, Pt. on medications     Neuro/Psych negative neurological ROS  negative psych ROS   GI/Hepatic Neg liver ROS, GERD-  ,  Endo/Other  negative endocrine ROS  Renal/GU negative Renal ROS     Musculoskeletal   Abdominal   Peds  Hematology   Anesthesia Other Findings   Reproductive/Obstetrics                             Anesthesia Physical Anesthesia Plan  ASA: III  Anesthesia Plan: General   Post-op Pain Management:    Induction: Intravenous  Airway Management Planned: Oral ETT  Additional Equipment:   Intra-op Plan:   Post-operative Plan: Extubation in OR  Informed Consent: I have reviewed the patients History and Physical, chart, labs and discussed the procedure including the risks, benefits and alternatives for the proposed anesthesia with the patient or authorized representative who has indicated his/her understanding and acceptance.   Dental advisory given  Plan Discussed with: CRNA, Anesthesiologist and Surgeon  Anesthesia Plan Comments:         Anesthesia Quick Evaluation

## 2015-01-07 NOTE — Interval H&P Note (Signed)
History and Physical Interval Note:  01/07/2015 6:44 AM  Elizabeth [name]  has presented today for surgery, with the diagnosis of diverticulitis  The goals and the various methods of treatment have been discussed with the patient and family. After consideration of risks, benefits and other options for treatment, the patient has consented to  Procedure(s): LAPAROSCOPIC ASSIST SIGMOID COLECTOMY (N/A) as a surgical intervention .  The patient's history has been reviewed, patient examined today, no change in status, stable for surgery.  I have reviewed the patient's chart and labs.  Questions were answered to the patient's satisfaction.     Adin Hector

## 2015-01-07 NOTE — Anesthesia Procedure Notes (Signed)
Procedure Name: Intubation Date/Time: 01/07/2015 7:28 AM Performed by: Ollen Bowl Pre-anesthesia Checklist: Patient identified, Emergency Drugs available, Suction available, Patient being monitored and Timeout performed Patient Re-evaluated:Patient Re-evaluated prior to inductionOxygen Delivery Method: Circle system utilized and Simple face mask Preoxygenation: Pre-oxygenation with 100% oxygen Intubation Type: IV induction and Rapid sequence Ventilation: Mask ventilation without difficulty Laryngoscope Size: Miller and 2 Grade View: Grade I Tube type: Oral Tube size: 7.0 mm Number of attempts: 1 Airway Equipment and Method: Patient positioned with wedge pillow and Stylet Placement Confirmation: ETT inserted through vocal cords under direct vision,  positive ETCO2 and breath sounds checked- equal and bilateral Secured at: 22 cm Tube secured with: Tape Dental Injury: Teeth and Oropharynx as per pre-operative assessment

## 2015-01-08 LAB — CBC
HCT: 38.4 % (ref 36.0–46.0)
Hemoglobin: 13.2 g/dL (ref 12.0–15.0)
MCH: 33.8 pg (ref 26.0–34.0)
MCHC: 34.4 g/dL (ref 30.0–36.0)
MCV: 98.2 fL (ref 78.0–100.0)
PLATELETS: 206 10*3/uL (ref 150–400)
RBC: 3.91 MIL/uL (ref 3.87–5.11)
RDW: 12.7 % (ref 11.5–15.5)
WBC: 13.6 10*3/uL — AB (ref 4.0–10.5)

## 2015-01-08 LAB — BASIC METABOLIC PANEL
Anion gap: 4 — ABNORMAL LOW (ref 5–15)
BUN: 12 mg/dL (ref 6–23)
CALCIUM: 8.1 mg/dL — AB (ref 8.4–10.5)
CO2: 27 mmol/L (ref 19–32)
Chloride: 104 mmol/L (ref 96–112)
Creatinine, Ser: 0.84 mg/dL (ref 0.50–1.10)
GFR calc Af Amer: 89 mL/min — ABNORMAL LOW (ref >90)
GFR, EST NON AFRICAN AMERICAN: 77 mL/min — AB (ref >90)
Glucose, Bld: 115 mg/dL — ABNORMAL HIGH (ref 70–99)
POTASSIUM: 4.5 mmol/L (ref 3.5–5.1)
Sodium: 135 mmol/L (ref 135–145)

## 2015-01-08 NOTE — Progress Notes (Signed)
1 Day Post-Op  Subjective: Awake and alert. Hemodynamically stable. States her pain is under good control. No nausea. States she is hearing bowel sounds in her abdomen. No respiratory difficulty.  Foley bag is full of clear urine. Output not recorded for overnight.. Afebrile. Heart rate 66. Respirations 16. BP 108/63. Hemoglobin 13.2. WBC 13,600. Potassium 4.5. Creatinine 0.84. Glucose 1:15.  Objective: Vital signs in last 24 hours: Temp:  [97.6 F (36.4 C)-99.1 F (37.3 C)] 97.6 F (36.4 C) (01/27 0052) Pulse Rate:  [64-92] 66 (01/27 0052) Resp:  [10-28] 16 (01/27 0052) BP: (93-135)/(58-92) 108/63 mmHg (01/27 0052) SpO2:  [93 %-100 %] 94 % (01/27 0052) FiO2 (%):  [94 %] 94 % (01/26 1200) Weight:  [218 lb (98.884 kg)-224 lb 13.9 oz (102 kg)] 224 lb 13.9 oz (102 kg) (01/26 1245) Last BM Date: 01/06/15  Intake/Output from previous day: 01/26 0701 - 01/27 0700 In: 4537 [I.V.:4287; IV Piggyback:250] Out: 385 [Urine:185; Blood:200] Intake/Output this shift: Total I/O In: 1000 [I.V.:1000] Out: -    EXAM: General appearance: Alert and cooperative. Pleasant and conversant. No distress. Resp: clear to auscultation bilaterally GI: Abdomen is soft. Not distended. Appropriately tender. some bowel sounds present. All wounds look good. No bleeding.  Lab Results:  Results for orders placed or performed during the hospital encounter of 01/07/15 (from the past 24 hour(s))  Type and screen     Status: None   Collection Time: 01/07/15  6:30 AM  Result Value Ref Range   ABO/RH(D) O NEG    Antibody Screen NEG    Sample Expiration 01/10/2015   ABO/Rh     Status: None   Collection Time: 01/07/15  6:30 AM  Result Value Ref Range   ABO/RH(D) O NEG   CBC     Status: Abnormal   Collection Time: 01/07/15 11:39 AM  Result Value Ref Range   WBC 15.0 (H) 4.0 - 10.5 K/uL   RBC 4.61 3.87 - 5.11 MIL/uL   Hemoglobin 15.6 (H) 12.0 - 15.0 g/dL   HCT 45.0 36.0 - 46.0 %   MCV 97.6 78.0 - 100.0 fL   MCH 33.8 26.0 - 34.0 pg   MCHC 34.7 30.0 - 36.0 g/dL   RDW 12.7 11.5 - 15.5 %   Platelets 203 150 - 400 K/uL  Basic metabolic panel     Status: Abnormal   Collection Time: 01/07/15 11:39 AM  Result Value Ref Range   Sodium 139 135 - 145 mmol/L   Potassium 3.7 3.5 - 5.1 mmol/L   Chloride 106 96 - 112 mmol/L   CO2 24 19 - 32 mmol/L   Glucose, Bld 126 (H) 70 - 99 mg/dL   BUN 8 6 - 23 mg/dL   Creatinine, Ser 1.07 0.50 - 1.10 mg/dL   Calcium 8.6 8.4 - 10.5 mg/dL   GFR calc non Af Amer 57 (L) >90 mL/min   GFR calc Af Amer 66 (L) >90 mL/min   Anion gap 9 5 - 15  Glucose, capillary     Status: Abnormal   Collection Time: 01/07/15  4:58 PM  Result Value Ref Range   Glucose-Capillary 137 (H) 70 - 99 mg/dL  Basic metabolic panel     Status: Abnormal   Collection Time: 01/08/15  4:00 AM  Result Value Ref Range   Sodium 135 135 - 145 mmol/L   Potassium 4.5 3.5 - 5.1 mmol/L   Chloride 104 96 - 112 mmol/L   CO2 27 19 - 32 mmol/L  Glucose, Bld 115 (H) 70 - 99 mg/dL   BUN 12 6 - 23 mg/dL   Creatinine, Ser 0.84 0.50 - 1.10 mg/dL   Calcium 8.1 (L) 8.4 - 10.5 mg/dL   GFR calc non Af Amer 77 (L) >90 mL/min   GFR calc Af Amer 89 (L) >90 mL/min   Anion gap 4 (L) 5 - 15  CBC     Status: Abnormal   Collection Time: 01/08/15  4:00 AM  Result Value Ref Range   WBC 13.6 (H) 4.0 - 10.5 K/uL   RBC 3.91 3.87 - 5.11 MIL/uL   Hemoglobin 13.2 12.0 - 15.0 g/dL   HCT 38.4 36.0 - 46.0 %   MCV 98.2 78.0 - 100.0 fL   MCH 33.8 26.0 - 34.0 pg   MCHC 34.4 30.0 - 36.0 g/dL   RDW 12.7 11.5 - 15.5 %   Platelets 206 150 - 400 K/uL     Studies/Results: No results found.  Marland Kitchen alvimopan  12 mg Oral BID  . dicyclomine  20 mg Oral Q6H  . enoxaparin (LOVENOX) injection  40 mg Subcutaneous Q24H  . HYDROmorphone PCA 0.3 mg/mL   Intravenous 6 times per day  . mesalamine  1,000 mg Oral QID  . pantoprazole  40 mg Oral Daily  . potassium chloride  20 mEq Oral BID     Assessment/Plan: s/p Procedure(s): LOW  ANTERIOR COLON RESECTION  POD 1. Laparoscopic lysis of adhesions, laparoscopic assisted low anterior resection for recurrent, advanced diverticulitis Stable Nothing by mouth except ice chips Entereg  protocol Discontinue Foley tomorrow Mobilize  History right colectomy for Crohn's disease-currently no evidence of active disease. Chronic diarrhea Obesity Anxiety Irritable bowel syndrome, suspected  @PROBHOSP @  LOS: 1 day    Estelle Greenleaf M 01/08/2015  . .prob

## 2015-01-09 ENCOUNTER — Encounter (HOSPITAL_COMMUNITY): Payer: Self-pay | Admitting: General Surgery

## 2015-01-09 LAB — BASIC METABOLIC PANEL
Anion gap: 3 — ABNORMAL LOW (ref 5–15)
BUN: 11 mg/dL (ref 6–23)
CALCIUM: 8.1 mg/dL — AB (ref 8.4–10.5)
CO2: 26 mmol/L (ref 19–32)
Chloride: 107 mmol/L (ref 96–112)
Creatinine, Ser: 0.68 mg/dL (ref 0.50–1.10)
GFR calc Af Amer: >90 mL/min (ref >90)
GFR calc non Af Amer: >90 mL/min (ref >90)
GLUCOSE: 80 mg/dL (ref 70–99)
Potassium: 4.1 mmol/L (ref 3.5–5.1)
Sodium: 136 mmol/L (ref 135–145)

## 2015-01-09 LAB — CBC
HEMATOCRIT: 33.9 % — AB (ref 36.0–46.0)
Hemoglobin: 11.6 g/dL — ABNORMAL LOW (ref 12.0–15.0)
MCH: 34 pg (ref 26.0–34.0)
MCHC: 34.2 g/dL (ref 30.0–36.0)
MCV: 99.4 fL (ref 78.0–100.0)
PLATELETS: 180 10*3/uL (ref 150–400)
RBC: 3.41 MIL/uL — ABNORMAL LOW (ref 3.87–5.11)
RDW: 12.8 % (ref 11.5–15.5)
WBC: 8.4 10*3/uL (ref 4.0–10.5)

## 2015-01-09 MED ORDER — OXYCODONE-ACETAMINOPHEN 5-325 MG PO TABS
1.0000 | ORAL_TABLET | ORAL | Status: DC | PRN
  Administered 2015-01-09 – 2015-01-13 (×14): 2 via ORAL
  Filled 2015-01-09 (×4): qty 2
  Filled 2015-01-09: qty 1
  Filled 2015-01-09 (×11): qty 2

## 2015-01-09 MED ORDER — FENTANYL CITRATE 0.05 MG/ML IJ SOLN
50.0000 ug | INTRAMUSCULAR | Status: DC | PRN
  Administered 2015-01-09 – 2015-01-10 (×10): 50 ug via INTRAVENOUS
  Filled 2015-01-09 (×10): qty 2

## 2015-01-09 NOTE — Progress Notes (Signed)
2 Days Post-Op  Subjective: Doing reasonably well. Complains of itching, likely due to PCA Dilaudid. No stool or flatus, but very hungry and denies nausea. No respiratory problems. Ambulating in hall some. Afebrile. Vital signs stable. Reasonable urine output. Hemoglobin 11.6. WBC 8400.  Objective: Vital signs in last 24 hours: Temp:  [97.9 F (36.6 C)-98.4 F (36.9 C)] 98.4 F (36.9 C) (01/28 0209) Pulse Rate:  [63-71] 71 (01/28 0209) Resp:  [12-21] 21 (01/28 0456) BP: (109-126)/(51-64) 126/64 mmHg (01/28 0209) SpO2:  [94 %-100 %] 100 % (01/28 0456) FiO2 (%):  [97 %] 97 % (01/27 1600) Last BM Date: 01/06/15  Intake/Output from previous day: 01/27 0701 - 01/28 0700 In: 2590.3 [I.V.:2590.3] Out: 825 [Urine:825] Intake/Output this shift: Total I/O In: 1331.3 [I.V.:1331.3] Out: 575 [Urine:575]  General appearance: Alert. Talkative. Minimal distress. Oriented. Resp: clear to auscultation bilaterally GI: Abdomen soft. Nondistended. Wound clean with a little bit of dried drainage as expected. Hypoactive bowel sounds. Appropriate tenderness.  Lab Results:  Results for orders placed or performed during the hospital encounter of 01/07/15 (from the past 24 hour(s))  CBC     Status: Abnormal   Collection Time: 01/09/15  5:55 AM  Result Value Ref Range   WBC 8.4 4.0 - 10.5 K/uL   RBC 3.41 (L) 3.87 - 5.11 MIL/uL   Hemoglobin 11.6 (L) 12.0 - 15.0 g/dL   HCT 33.9 (L) 36.0 - 46.0 %   MCV 99.4 78.0 - 100.0 fL   MCH 34.0 26.0 - 34.0 pg   MCHC 34.2 30.0 - 36.0 g/dL   RDW 12.8 11.5 - 15.5 %   Platelets 180 150 - 400 K/uL     Studies/Results: No results found.  Marland Kitchen alvimopan  12 mg Oral BID  . dicyclomine  20 mg Oral Q6H  . enoxaparin (LOVENOX) injection  40 mg Subcutaneous Q24H  . mesalamine  1,000 mg Oral QID  . pantoprazole  40 mg Oral Daily  . potassium chloride  20 mEq Oral BID     Assessment/Plan: s/p Procedure(s): LOW ANTERIOR COLON RESECTION  POD 2. Laparoscopic  lysis of adhesions, laparoscopic assisted low anterior resection for recurrent, advanced diverticulitis Stable Clear liquids Entereg protocol Discontinue Foley Discontinue PCA Dilaudid and substitute fentanyl and Percocet. Mobilize more Check pathology report.  History right colectomy for Crohn's disease-currently no evidence of active disease. Chronic diarrhea Obesity Anxiety Irritable bowel syndrome, suspected  @PROBHOSP @  LOS: 2 days    Elizabeth [name] M 01/09/2015  . .prob

## 2015-01-10 LAB — CBC WITH DIFFERENTIAL/PLATELET
BASOS ABS: 0 10*3/uL (ref 0.0–0.1)
BASOS PCT: 0 % (ref 0–1)
EOS ABS: 0.1 10*3/uL (ref 0.0–0.7)
Eosinophils Relative: 1 % (ref 0–5)
HEMATOCRIT: 35.2 % — AB (ref 36.0–46.0)
HEMOGLOBIN: 12.1 g/dL (ref 12.0–15.0)
Lymphocytes Relative: 15 % (ref 12–46)
Lymphs Abs: 1.2 10*3/uL (ref 0.7–4.0)
MCH: 34.1 pg — AB (ref 26.0–34.0)
MCHC: 34.4 g/dL (ref 30.0–36.0)
MCV: 99.2 fL (ref 78.0–100.0)
MONOS PCT: 9 % (ref 3–12)
Monocytes Absolute: 0.7 10*3/uL (ref 0.1–1.0)
NEUTROS ABS: 5.7 10*3/uL (ref 1.7–7.7)
NEUTROS PCT: 75 % (ref 43–77)
Platelets: 201 10*3/uL (ref 150–400)
RBC: 3.55 MIL/uL — AB (ref 3.87–5.11)
RDW: 12.5 % (ref 11.5–15.5)
WBC: 7.7 10*3/uL (ref 4.0–10.5)

## 2015-01-10 LAB — BASIC METABOLIC PANEL
ANION GAP: 3 — AB (ref 5–15)
BUN: 5 mg/dL — ABNORMAL LOW (ref 6–23)
CHLORIDE: 103 mmol/L (ref 96–112)
CO2: 31 mmol/L (ref 19–32)
Calcium: 8.7 mg/dL (ref 8.4–10.5)
Creatinine, Ser: 0.68 mg/dL (ref 0.50–1.10)
GFR calc Af Amer: >90 mL/min (ref >90)
Glucose, Bld: 100 mg/dL — ABNORMAL HIGH (ref 70–99)
POTASSIUM: 3.9 mmol/L (ref 3.5–5.1)
Sodium: 137 mmol/L (ref 135–145)

## 2015-01-10 MED ORDER — ACETAMINOPHEN 10 MG/ML IV SOLN
1000.0000 mg | Freq: Once | INTRAVENOUS | Status: AC
  Administered 2015-01-10: 1000 mg via INTRAVENOUS
  Filled 2015-01-10: qty 100

## 2015-01-10 MED ORDER — FENTANYL CITRATE 0.05 MG/ML IJ SOLN
50.0000 ug | INTRAMUSCULAR | Status: DC | PRN
  Administered 2015-01-10: 100 ug via INTRAVENOUS
  Administered 2015-01-10: 50 ug via INTRAVENOUS
  Administered 2015-01-10 (×4): 100 ug via INTRAVENOUS
  Administered 2015-01-10: 50 ug via INTRAVENOUS
  Administered 2015-01-11: 100 ug via INTRAVENOUS
  Administered 2015-01-11: 50 ug via INTRAVENOUS
  Administered 2015-01-11 (×2): 100 ug via INTRAVENOUS
  Filled 2015-01-10 (×11): qty 2

## 2015-01-10 NOTE — Progress Notes (Signed)
UR completed.  Hermione Havlicek, RN BSN [facility] CCM Trauma/Neuro ICU Case Manager [phone]  

## 2015-01-10 NOTE — Progress Notes (Signed)
3 Days Post-Op  Subjective: Abdominal pain is worse during the night.  she says it is more upper abdominal than lower abdominal. No sudden changes. Has continued to ambulate. Voiding normally and without difficulty or urgency.  tolerating clear liquids without nausea. Vital signs are stable. BP 115/77. Heart rate 70. Temp 98.4. Skin warm and dry. Sitting in chair.  Objective: Vital signs in last 24 hours: Temp:  [97.5 F (36.4 C)-99 F (37.2 C)] 98.4 F (36.9 C) (01/29 0536) Pulse Rate:  [67-82] 67 (01/29 0536) Resp:  [14-20] 20 (01/29 0536) BP: (115-131)/(59-87) 115/77 mmHg (01/29 0536) SpO2:  [92 %-99 %] 96 % (01/29 0536) Last BM Date: 01/06/15  Intake/Output from previous day: 01/28 0701 - 01/29 0700 In: 2072.6 [P.O.:600; I.V.:1472.6] Out: 1775 [ZHYQM:5784] Intake/Output this shift: Total I/O In: 754.6 [P.O.:240; I.V.:514.6] Out: 500 [Urine:500]  General appearance: Alert. Sitting in chair. Skin warm and dry. Less animated but does not look toxic or acutely ill. Pulses full, rate 70. Chest wall: Lungs clear to auscultation bilaterally anteriorly. GI: Abdomen is soft and does not seem distended but is more tender diffusely than yesterday. No localizing findings. Wound appears intact without unusual drainage or redness.  Lab Results:  No results found for this or any previous visit (from the past 24 hour(s)).   Studies/Results: No results found.  Marland Kitchen alvimopan  12 mg Oral BID  . dicyclomine  20 mg Oral Q6H  . enoxaparin (LOVENOX) injection  40 mg Subcutaneous Q24H  . mesalamine  1,000 mg Oral QID  . pantoprazole  40 mg Oral Daily  . potassium chloride  20 mEq Oral BID     Assessment/Plan: s/p Procedure(s): LOW ANTERIOR COLON RESECTION   POD 3. Laparoscopic lysis of adhesions, laparoscopic assisted low anterior resection for recurrent, advanced diverticulitis Stable but increasing abdominal pain of some concern, despite the fact that she does not look toxic or  septic. Check CBC and b-met.   If WBC elevated proceed with CT scan of abdomen. Clear liquids Entereg protocol   History right colectomy for Crohn's disease-currently no evidence of active disease. Chronic diarrhea Obesity Anxiety Irritable bowel syndrome, suspected  @PROBHOSP @  LOS: 3 days    Elizabeth [name] M 01/10/2015  . .prob

## 2015-01-11 NOTE — Progress Notes (Signed)
4 Days Post-Op LAR Subjective: Still having abd pain that is worse with movement.    Voiding normally and without difficulty or urgency.  tolerating clear liquids without nausea. Having some flatus and a small BM, no nausea but is having some crampy pain  Objective: Vital signs in last 24 hours: Temp:  [98.2 F (36.8 C)-99.4 F (37.4 C)] 99.1 F (37.3 C) (01/30 0523) Pulse Rate:  [65-103] 71 (01/30 0523) Resp:  [20-22] 21 (01/30 0523) BP: (123-130)/(68-83) 123/68 mmHg (01/30 0523) SpO2:  [93 %-98 %] 93 % (01/30 0523) Last BM Date: 01/06/15  Intake/Output from previous day: 01/29 0701 - 01/30 0700 In: 740 [P.O.:740] Out: 685 [Urine:685] Intake/Output this shift:    General appearance: Alert. Sitting in chair. Skin warm and dry.  Chest wall: Lungs clear to auscultation  GI: Abdomen is soft and does not seem distended but is more tender diffusely than yesterday. No localizing findings. Wound appears intact without unusual drainage or redness.  Lab Results:  No results found for this or any previous visit (from the past 24 hour(s)).   Studies/Results: No results found.  Marland Kitchen alvimopan  12 mg Oral BID  . dicyclomine  20 mg Oral Q6H  . enoxaparin (LOVENOX) injection  40 mg Subcutaneous Q24H  . mesalamine  1,000 mg Oral QID  . pantoprazole  40 mg Oral Daily  . potassium chloride  20 mEq Oral BID     Assessment/Plan: s/p Procedure(s): LOW ANTERIOR COLON RESECTION   POD 4 Laparoscopic lysis of adhesions, laparoscopic assisted low anterior resection for recurrent, advanced diverticulitis Stable abdominal pain that appears to be due to incision Labs stable Will advance to full liquids    History right colectomy for Crohn's disease-currently no evidence of active disease. Chronic diarrhea Obesity Anxiety Irritable bowel syndrome, suspected  @PROBHOSP @   LOS: 4 days    Adelis Docter C. 04/20/3266

## 2015-01-12 MED ORDER — ALUM & MAG HYDROXIDE-SIMETH 200-200-20 MG/5ML PO SUSP
30.0000 mL | Freq: Four times a day (QID) | ORAL | Status: DC | PRN
  Administered 2015-01-12: 30 mL via ORAL
  Filled 2015-01-12: qty 30

## 2015-01-12 MED ORDER — NICOTINE 21 MG/24HR TD PT24
21.0000 mg | MEDICATED_PATCH | Freq: Every day | TRANSDERMAL | Status: DC
  Administered 2015-01-12: 21 mg via TRANSDERMAL
  Filled 2015-01-12 (×2): qty 1

## 2015-01-12 NOTE — Progress Notes (Signed)
5 Days Post-Op LAR Subjective: Has abd pain that is worse with movement but better today.    tolerating full liquids without nausea.   Having flatus and BM's, no nausea   Objective: Vital signs in last 24 hours: Temp:  [97.9 F (36.6 C)-99 F (37.2 C)] 98.1 F (36.7 C) (01/31 8177) Pulse Rate:  [62-100] 80 (01/31 0638) Resp:  [18-20] 18 (01/31 1165) BP: (130-146)/(71-77) 146/77 mmHg (01/31 0638) SpO2:  [96 %-100 %] 96 % (01/31 7903) Last BM Date: 01/11/15  Intake/Output from previous day:   Intake/Output this shift:    General appearance: Alert. Sitting in chair. Skin warm and dry.  Chest wall: Lungs clear to auscultation  GI: Abdomen is soft and does not seem distended. No localizing findings.  Wound intact without unusual drainage or redness.  Lab Results:  No results found for this or any previous visit (from the past 24 hour(s)).   Studies/Results: No results found.  . dicyclomine  20 mg Oral Q6H  . enoxaparin (LOVENOX) injection  40 mg Subcutaneous Q24H  . mesalamine  1,000 mg Oral QID  . pantoprazole  40 mg Oral Daily  . potassium chloride  20 mEq Oral BID     Assessment/Plan: s/p Procedure(s): LOW ANTERIOR COLON RESECTION   POD 5 Laparoscopic lysis of adhesions, laparoscopic assisted low anterior resection for recurrent, advanced diverticulitis Stable abdominal pain that appears to be due to incision.  Will try an abd binder to help with this. Will advance to sodt diet and saline lock IV.  Probable d/c tom    History right colectomy for Crohn's disease-currently no evidence of active disease. Chronic diarrhea Obesity Anxiety Irritable bowel syndrome, suspected  @PROBHOSP @   LOS: 5 days    Monzerrath Mcburney C. 8/33/3832

## 2015-01-13 MED ORDER — OXYCODONE-ACETAMINOPHEN 5-325 MG PO TABS: 1.0000 | ORAL_TABLET | ORAL | Status: DC | PRN

## 2015-01-13 NOTE — Discharge Summary (Signed)
Patient ID: Elizabeth [name] [id number] 54 y.o. Nov 19, 1961  Admit date: 01/07/2015  Discharge date and time: 01/13/2014  Admitting Physician: Adin Hector  Discharge Physician: Adin Hector  Admission Diagnoses: diviticulitis  Discharge Diagnoses: Diverticulitis sigmoid colon History of Crohn's disease, in remission history of right colectomy for Crohn's disease Obesity anxiety Family history of colon cancer  irritable bowel syndrome  Operations: Procedure(s): LOW ANTERIOR COLON RESECTION  Admission Condition: good  Discharged Condition: good  Indication for Admission: She is a 54 year old female with recurrent diverticulitis. She has diarrhea, 10 stools a day. This is only slightly worse than it was since she had her ileocecal Crohn's resection many years ago. This was done in [place]. She says she continues to have intermittent left lower quadrant pain but no fever Recent CBC shows a WBC of 8500 with normal differential. Dr. Laural Golden performed colonoscopy recently and there was no evidence of Crohn's colitis A CT scan from August of this year shows a little bit of wall thickening from the neoterminal ileum and mild diffuse colonic wall thickening of the sigmoid and a few diverticula. She underwent barium enema on September 13, 2014. She has some diverticulosis with very mild wall thickening and possible hypertonicity, slightly impressive. 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 The patient and Dr. Laural Golden  were both in favor of proceeding with sigmoid colectomy. I agree. She was strongly urged to stop smoking she underwent a bowel prep at home.   Hospital Course: On the  day of admission the patient was taken to the operating room and underwent a laparoscopic-assisted low anterior resection. The sigmoid diverticulitis was moderately severe with a near grapefruit sized hard palpable mass in the sigmoid colon. Bowel wall proximally was chronically thickened but otherwise healthy, suggesting partial obstruction. I did not see any evidence of small bowel Crohn's disease. Anastomosis was created at the level of the peritoneal reflection, 29 mm EEA stapler. About 20 cm above the anal verge.      Final pathology report showed chronic active diverticulitis with inflammation and fibrosis extending into the serosal tissues. No malignancy. Margins of resection were negative for inflammation.      Postoperatively the patient recovered without any complication. On postop day 3 she had very severe pain which was of some concern but her vital signs and physical exam were fairly unremarkable and her white count was normal. She progressed in diet and activities and was ready to go home on the day of discharge and requesting discharge. At the time of discharge the patient was afebrile with stable vital signs P she looked good and was friendly and in no distress. Her abdomen was soft. Still a little bit tender but not distended. The wounds looked good with minimal drainage and no sign of infection. All the skin staples were left in place.      She was carefully counseled in hydration, diet, activities, and restrictions.  she was given a prescription for Percocet for pain. She was told to continue all of her usual medications, including Nicoderm patch for smoking cessation. She was asked to return to see me in the office in 7 days for wound check and staple removal.  Consults: None  Significant Diagnostic Studies: Surgical  pathology. Blood work.  Treatments: surgery: Laparoscopic assisted low anterior resection.  Disposition: Home  Patient Instructions:    Medication List    TAKE  these medications        dicyclomine 20 MG tablet  Commonly known as:  BENTYL  Take 1 tablet (20 mg total) by mouth every 6 (six) hours.     mesalamine 500 MG CR capsule  Commonly known as:  PENTASA  Take 1,000 mg by mouth 4 (four) times daily.     omeprazole 20 MG capsule  Commonly known as:  PRILOSEC  Take 2 capsules (40 mg total) by mouth daily.     oxyCODONE-acetaminophen 5-325 MG per tablet  Commonly known as:  PERCOCET/ROXICET  Take 1-2 tablets by mouth every 4 (four) hours as needed for moderate pain.     potassium chloride 10 MEQ CR capsule  Commonly known as:  MICRO-K  Take 10 mEq by mouth 2 (two) times daily.     promethazine 25 MG tablet  Commonly known as:  PHENERGAN  Take 1 tablet (25 mg total) by mouth 2 (two) times daily as needed for nausea or vomiting.     valsartan-hydrochlorothiazide 160-12.5 MG per tablet  Commonly known as:  DIOVAN-HCT  Take 1 tablet by mouth daily.        Activity: No driving for 2-3 weeks.  no lifting or strenuous exercise for 6 weeks.  Diet: low fat, low cholesterol diet Wound Care: as directed  Follow-up:  With Dr. Dalbert Batman in 1 week.  Signed: Edsel Petrin. Dalbert Batman, M.D., FACS General and minimally invasive surgery Breast and Colorectal Surgery  01/13/2015, 6:38 AM

## 2015-01-13 NOTE — Discharge Instructions (Signed)
See above

## 2015-02-17 ENCOUNTER — Ambulatory Visit (INDEPENDENT_AMBULATORY_CARE_PROVIDER_SITE_OTHER): Payer: PRIVATE HEALTH INSURANCE | Admitting: Internal Medicine

## 2015-02-17 ENCOUNTER — Encounter (INDEPENDENT_AMBULATORY_CARE_PROVIDER_SITE_OTHER): Payer: Self-pay | Admitting: Internal Medicine

## 2015-02-17 VITALS — BP 110/68 | HR 72 | Temp 98.4°F | Resp 18 | Ht 67.0 in | Wt 199.4 lb

## 2015-02-17 DIAGNOSIS — K5090 Crohn's disease, unspecified, without complications: Secondary | ICD-10-CM

## 2015-02-17 DIAGNOSIS — R110 Nausea: Secondary | ICD-10-CM

## 2015-02-17 NOTE — Progress Notes (Signed)
Presenting complaint;  Follow-up for Crohn's disease and GERD. Patient complains of nausea.  Subjective:  Patient is 54 year old Caucasian female who is here for scheduled visit. She was last seen on 11/11/2014. She underwent surgery on 01/07/2015 for sigmoid colon resection because of recurrent sigmoid colon diverticulitis. She had extensive adhesions therefore surgery could not be completed laparoscopically. Patient is concerned because she is still having abdominal pain. She states she had 3 open areas now covered with scab. She is not having any drainage. She states she has not been able to do much on account of pain with weakness and nausea. She does not like to take pain medication because of side effects. She complains of bilateral subcostal abdominal pain which she describes as aching pain that one experiences after doing too many pushups. She also has pain radiating to her right shoulder usually at night and she also has burning in her back. Her appetite is not good. She has lost 14 pounds since her last visit. She continues to complain of intermittent nausea but no vomiting. She is having 1 formed stool daily and she denies melena or rectal bleeding. She states PPIs working. She's had 3 episodes of heartburn in the last 6 weeks. She denies fever chills or night sweats. She has an appointment to see Dr. Dalbert Batman next week.   Current Medications: Outpatient Encounter Prescriptions as of 02/17/2015  Medication Sig  . acetaminophen (TYLENOL) 500 MG tablet Take 1,000 mg by mouth as needed.  Marland Kitchen HYDROcodone-acetaminophen (NORCO/VICODIN) 5-325 MG per tablet Take 1 tablet by mouth at bedtime as needed for moderate pain.  . mesalamine (PENTASA) 500 MG CR capsule Take 1,000 mg by mouth 4 (four) times daily.   Marland Kitchen omeprazole (PRILOSEC) 20 MG capsule Take 2 capsules (40 mg total) by mouth daily.  . potassium chloride (MICRO-K) 10 MEQ CR capsule Take 10 mEq by mouth daily.   . promethazine  (PHENERGAN) 25 MG tablet Take 1 tablet (25 mg total) by mouth 2 (two) times daily as needed for nausea or vomiting.  . valsartan-hydrochlorothiazide (DIOVAN-HCT) 160-12.5 MG per tablet Take 1 tablet by mouth daily.  . [DISCONTINUED] dicyclomine (BENTYL) 20 MG tablet Take 1 tablet (20 mg total) by mouth every 6 (six) hours. (Patient not taking: Reported on 02/17/2015)  . [DISCONTINUED] oxyCODONE-acetaminophen (PERCOCET/ROXICET) 5-325 MG per tablet Take 1-2 tablets by mouth every 4 (four) hours as needed for moderate pain. (Patient not taking: Reported on 02/17/2015)     Objective: Blood pressure 110/68, pulse 72, temperature 98.4 F (36.9 C), temperature source Oral, resp. rate 18, height 5' 7"  (1.702 m), weight 199 lb 6.4 oz (90.447 kg). Patient is alert and in no acute distress. Conjunctiva is pink. Sclera is nonicteric Oropharyngeal mucosa is normal. No neck masses or thyromegaly noted. Cardiac exam with regular rhythm normal S1 and S2. No murmur or gallop noted. Lungs are clear to auscultation. Abdomen. She has dressings covering most of the midline scar. She has 3 small scabs words inferior end. All sounds are normal. On palpation abdomen is soft with mild tenderness in right lower quadrant and below the right costal margin laterally. No organomegaly or masses.  No LE edema or clubbing noted.  Labs/studies Results: H&H on 01/10/2015 was 12.1 and 35.2.    Assessment:  #1. Crohn's disease involving distal small bowel. She appears to be in remission. Active  disease not identified at recent surgery for sigmoid diverticulitis. Last colonoscopy was in September 2015 revealing focal erythema at ileocolonic anastomosis. #2. S/P  sigmoid colon resection for recurrent sigmoid diverticulitis. She had ongoing diverticulitis on path and no changes of Crohn's disease and resected segment. She remains with migratory abdominal pain most of which appears to be abdominal wall pain. Not sure why it is  taking a long time to recover. #3. Nausea without vomiting may be secondary to pain medication. #4. GERD. Symptoms well controlled with therapy   Plan:  Continue Pentasa at 1 g by mouth 4 times a day. Continue omeprazole at 40 mg by mouth every morning. Call for fever diarrhea or rectal bleeding. She will return to work when cleared by Dr. Fanny Skates. Office visit in 6 months.

## 2015-02-17 NOTE — Patient Instructions (Signed)
Notify if you have fever or rectal bleeding

## 2015-05-01 ENCOUNTER — Telehealth (INDEPENDENT_AMBULATORY_CARE_PROVIDER_SITE_OTHER): Payer: Self-pay | Admitting: *Deleted

## 2015-05-01 NOTE — Telephone Encounter (Signed)
Elizabeth [name] called office and states that she has had stool study and it showed that she has Lacto Ferritin in her stool. She is being treated with Cipro , Flagyl and a Probiotic,Imodium. She is asking if this is the correct treatment for this. Patient advised that we would call her back after Dr.Rehman advised. Call the patient at home number.

## 2015-05-01 NOTE — Telephone Encounter (Signed)
Per Dr.Rehman this is okay. He ask that she call our office after she has completed the treatment. Patient called and made aware.

## 2015-07-24 ENCOUNTER — Other Ambulatory Visit (INDEPENDENT_AMBULATORY_CARE_PROVIDER_SITE_OTHER): Payer: Self-pay | Admitting: Internal Medicine

## 2015-08-26 ENCOUNTER — Ambulatory Visit (INDEPENDENT_AMBULATORY_CARE_PROVIDER_SITE_OTHER): Payer: PRIVATE HEALTH INSURANCE | Admitting: Internal Medicine

## 2015-12-11 ENCOUNTER — Other Ambulatory Visit: Payer: Self-pay | Admitting: Orthopaedic Surgery

## 2015-12-11 DIAGNOSIS — M25511 Pain in right shoulder: Secondary | ICD-10-CM

## 2015-12-12 ENCOUNTER — Ambulatory Visit (HOSPITAL_COMMUNITY)
Admission: RE | Admit: 2015-12-12 | Discharge: 2015-12-12 | Disposition: A | Discharge status: Home / Self Care | Payer: PRIVATE HEALTH INSURANCE | Source: Ambulatory Visit | Attending: Orthopaedic Surgery | Admitting: Orthopaedic Surgery

## 2015-12-12 DIAGNOSIS — M75111 Incomplete rotator cuff tear or rupture of right shoulder, not specified as traumatic: Secondary | ICD-10-CM | POA: Insufficient documentation

## 2015-12-12 DIAGNOSIS — M25511 Pain in right shoulder: Secondary | ICD-10-CM

## 2015-12-22 ENCOUNTER — Other Ambulatory Visit (HOSPITAL_COMMUNITY): Payer: PRIVATE HEALTH INSURANCE

## 2015-12-25 ENCOUNTER — Ambulatory Visit: Payer: PRIVATE HEALTH INSURANCE | Attending: Orthopaedic Surgery | Admitting: Physical Therapy

## 2015-12-25 ENCOUNTER — Encounter: Payer: Self-pay | Admitting: Physical Therapy

## 2015-12-25 DIAGNOSIS — M25511 Pain in right shoulder: Secondary | ICD-10-CM | POA: Diagnosis not present

## 2015-12-25 DIAGNOSIS — R5381 Other malaise: Secondary | ICD-10-CM

## 2015-12-25 DIAGNOSIS — R531 Weakness: Secondary | ICD-10-CM | POA: Insufficient documentation

## 2015-12-25 DIAGNOSIS — M25611 Stiffness of right shoulder, not elsewhere classified: Secondary | ICD-10-CM | POA: Diagnosis present

## 2015-12-25 NOTE — Therapy (Signed)
[facility] [place], [place], [address] Phone: [phone]   Fax:  [phone]  Physical Therapy Evaluation  Patient Details  Name: Elizabeth [name] MRN: [id number] Date of Birth: Aug 13, 1961 Referring Provider: Iona Hansen, MD  Encounter Date: 12/25/2015      PT End of Session - 12/25/15 1345    Visit Number 1   Number of Visits 12   Date for PT Re-Evaluation 02/05/16   PT Start Time 4627   PT Stop Time 1400   PT Time Calculation (min) 55 min   Activity Tolerance Patient limited by pain   Behavior During Therapy [facility] for tasks assessed/performed      Past Medical History  Diagnosis Date  . Crohn's disease ([place])   . Elevated transaminase level   . Hypertension   . Bronchitis   . Cough 01/28/2014    upper airway cough syndrome  . Diverticulitis   . Tobacco abuse   . Obesity   . GERD (gastroesophageal reflux disease)   . Pneumonia 1/15    Past Surgical History  Procedure Laterality Date  . Hemocolectomy  2005    right  . Abdominal hysterectomy  1980  . Appendectomy    . Colonoscopy    . Esophagogastroduodenoscopy  06/16/2012    Procedure: ESOPHAGOGASTRODUODENOSCOPY (EGD);  Surgeon: Rogene Houston, MD;  [facility];  Service: Endoscopy;  Laterality: N/A;  10:30 AM  . Colonoscopy N/A 08/16/2014    Procedure: COLONOSCOPY;  Surgeon: Rogene Houston, MD;  [facility];  Service: Endoscopy;  Laterality: N/A;  1200-rescheduled to 9/4 @ 10:45 Ann notified pt  . Laparoscopic sigmoid colectomy N/A 01/07/2015    Procedure: LOW ANTERIOR COLON RESECTION;  Surgeon: Fanny Skates, MD;  Location: [place];  Service: General;  Laterality: N/A;    There were no vitals filed for this visit.  Visit Diagnosis:  Pain in joint of right shoulder - Plan: PT plan of care cert/re-cert  Stiffness of right shoulder joint - Plan: PT plan of care cert/re-cert  Weakness - Plan: PT plan of care  cert/re-cert  Debility - Plan: PT plan of care cert/re-cert      Subjective Assessment - 12/25/15 1300    Subjective Patient began having shoulder pain 11/14/15 when working on a med cart at work. Patient is a Marine scientist.   Pertinent History HTN, Crohns Disease   Diagnostic tests mri, xray; results: mod RC tendinopathy/tendinosis, shallow tears of supraspinatus (no full thickness tears); ant/inf labral tear; mod GH degenerative changes   Patient Stated Goals decrease pain increase strength   Currently in Pain? Yes   Pain Score 6    Pain Location Shoulder   Pain Orientation Right;Anterior;Posterior   Pain Descriptors / Indicators Aching  ripping/tearing   Pain Type Acute pain   Pain Radiating Towards into upper arm   Pain Onset More than a month ago   Pain Frequency Intermittent   Aggravating Factors  moving   Pain Relieving Factors rest   Effect of Pain on Daily Activities difficult to do job            [facility] PT Assessment - 12/25/15 0001    Assessment   Medical Diagnosis R shoulder pain   Referring Provider Iona Hansen, MD   Onset Date/Surgical Date 11/14/15   Hand Dominance Right   Next MD Visit 12/30/15   Precautions   Precautions None   Balance Screen   Has the patient fallen in  the past 6 months No   Has the patient had a decrease in activity level because of a fear of falling?  No   Is the patient reluctant to leave their home because of a fear of falling?  No   Prior Function   Level of Independence Independent   Vocation Full time employment   Vocation Requirements nursing   Posture/Postural Control   Posture Comments forward head, rounded shoulders  patient holds right shoulder in guarded position   ROM / Strength   AROM / PROM / Strength AROM;PROM;Strength   AROM   AROM Assessment Site Shoulder   Right/Left Shoulder Right   Right Shoulder Extension 50 Degrees  pain at end range   Right Shoulder Flexion 125 Degrees   Right Shoulder ABduction 120  Degrees   Right Shoulder Internal Rotation 65 Degrees   Right Shoulder External Rotation 68 Degrees   PROM   PROM Assessment Site Shoulder   Right/Left Shoulder Right   Right Shoulder Flexion 135 Degrees   Right Shoulder ABduction 133 Degrees   Right Shoulder Internal Rotation 70 Degrees   Right Shoulder External Rotation 68 Degrees   Strength   Overall Strength Comments Biceps 5-/5  no c/o pain   Strength Assessment Site Shoulder   Right/Left Shoulder Right   Right Shoulder Flexion 4+/5   Right Shoulder Extension 5/5   Right Shoulder ABduction 4-/5  pain into pecs   Right Shoulder Internal Rotation 5/5   Right Shoulder External Rotation 4+/5  in available range   Palpation   Palpation comment increased tone and tenderness of pecs, UT, supraspinaus, biceps.   Special Tests    Special Tests Biceps/Labral Tests  Biceps load test Positive R shoulder   Laxity/Instability  --                   [facility] Adult PT Treatment/Exercise - 12/25/15 0001    Modalities   Modalities Electrical Stimulation   Electrical Stimulation   Electrical Stimulation Location R shoulder IFC x 15 min to tolerance 80-150 HZ   Electrical Stimulation Goals Pain                PT Education - 12/25/15 1659    Education provided Yes   Education Details HEP   Person(s) Educated Patient   Methods Explanation;Demonstration;Handout   Comprehension Verbalized understanding;Returned demonstration;Other (comment)  pain after flexion          PT Short Term Goals - 12/25/15 1709    PT SHORT TERM GOAL #1   Title I with inital HEP   Time 2   Period Weeks   Status New   PT SHORT TERM GOAL #2   Title decrease pain in R shoulder by 25% with ADLS.   Time 2   Period Weeks   Status New           PT Long Term Goals - 12/25/15 1710    PT LONG TERM GOAL #1   Title Able to perform ADLs including work requirements with R shoulder pain 2/10 or less.   Time 6   Period Weeks   Status New    PT LONG TERM GOAL #2   Title Demo active R shoulder flex to 165 deg to improve function.   Time 6   Period Weeks   Status New   PT LONG TERM GOAL #3   Title Demo 5/5 R shoulder strength   Time 6   Period Weeks   Status New  Plan - 12/25/15 1701    Clinical Impression Statement Patient presents with R shoulder pain. Imaging shows shallow tears of supraspinatus and an ant/inf labral tear. Patient would like to avoid surgery. 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    Pt will benefit from skilled therapeutic intervention in order to improve on the following deficits Decreased range of motion;Impaired UE functional use;Decreased activity tolerance;Pain;Decreased strength;Postural dysfunction   Rehab Potential Fair   PT Frequency 3x / week   PT Duration 6 weeks   PT Treatment/Interventions ADLs/Self Care Home Management;Cryotherapy;Electrical Stimulation;Moist Heat;Therapeutic exercise;Ultrasound;Neuromuscular re-education;Patient/family education;Manual techniques;Vasopneumatic Device;Passive range of motion   PT Next Visit Plan STW/manual to R pecs, biceps and RC; review isometrics, progress TE as tolerated; modalities for pain   PT Home Exercise Plan shoulder isometrics   Consulted and Agree with Plan of Care Patient         Problem List Patient Active Problem List   Diagnosis Date Noted  . Diverticulitis large intestine 01/07/2015  . Diverticulitis 07/03/2014  . Hypokalemia 07/03/2014  . Tobacco abuse   . Obesity   . Upper airway cough syndrome 01/28/2014  . IBS (irritable bowel syndrome) 07/10/2013  . Acute bilateral lower abdominal pain 03/26/2013  . Tiredness 03/26/2013  . Sigmoid diverticulitis 09/12/2012  . Epigastric pain 05/22/2012  . Nausea and vomiting 01/28/2012  . Crohn's disease of ileum ([place]) 01/10/2012  . GERD (gastroesophageal reflux disease) 01/10/2012  . HTN (hypertension)  01/10/2012    Madelyn Flavors PT  12/25/2015, 5:17 PM  [facility] [place], [place], [address] Phone: [phone]   Fax:  [phone]  Name: Elizabeth [name] MRN: [id number] Date of Birth: 04/08/1961

## 2015-12-25 NOTE — Patient Instructions (Signed)
Strengthening: Isometric Flexion (Gently with this one. Stop if any pain)  Using wall for resistance, press right fist into ball using light pressure. Hold __10__ seconds. Repeat __5_ times per set. Do ____ sets per session. Do _2___ sessions per day.  SHOULDER: Abduction (Isometric)  Use wall as resistance. Press arm against pillow. Keep elbow straight. Hold _10_ seconds. 5___ reps per set, 2___ sets per day, ___ days per week  Extension (Isometric)  Place left bent elbow and back of arm against wall. Press elbow against wall. Hold __10__ seconds. Repeat _5___ times. Do __2__ sessions per day.  Internal Rotation (Isometric)  Place palm of right fist against door frame, with elbow bent. Press fist against door frame. Hold _10___ seconds. Repeat __5__ times. Do __2__ sessions per day.  External Rotation (Isometric)  Place back of left fist against door frame, with elbow bent. Press fist against door frame. Hold __10__ seconds. Repeat _5___ times. Do ___2_ sessions per day.  Copyright  VHI. All rights reserved.  Madelyn Flavors, PT 12/25/2015 1:44 PM [facility] [address] [place], [place], [address] Phone: [phone]   Fax:  [phone]

## 2015-12-29 ENCOUNTER — Encounter: Payer: Self-pay | Admitting: Physical Therapy

## 2015-12-29 ENCOUNTER — Ambulatory Visit: Payer: PRIVATE HEALTH INSURANCE | Admitting: Physical Therapy

## 2015-12-29 DIAGNOSIS — R531 Weakness: Secondary | ICD-10-CM

## 2015-12-29 DIAGNOSIS — M25511 Pain in right shoulder: Secondary | ICD-10-CM | POA: Diagnosis not present

## 2015-12-29 DIAGNOSIS — R5381 Other malaise: Secondary | ICD-10-CM

## 2015-12-29 DIAGNOSIS — M25611 Stiffness of right shoulder, not elsewhere classified: Secondary | ICD-10-CM
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
## 2015-12-30 ENCOUNTER — Encounter: Payer: PRIVATE HEALTH INSURANCE | Admitting: Physical Therapy

## 2016-01-01 ENCOUNTER — Encounter: Payer: PRIVATE HEALTH INSURANCE | Admitting: Physical Therapy

## 2016-01-05 ENCOUNTER — Ambulatory Visit: Payer: PRIVATE HEALTH INSURANCE | Admitting: Physical Therapy

## 2016-01-05 DIAGNOSIS — M25511 Pain in right shoulder: Secondary | ICD-10-CM

## 2016-01-05 DIAGNOSIS — M25611 Stiffness of right shoulder, not elsewhere classified: Secondary | ICD-10-CM

## 2016-01-05 DIAGNOSIS — R5381 Other malaise: Secondary | ICD-10-CM

## 2016-01-05 DIAGNOSIS — R531 Weakness: Secondary | ICD-10-CM

## 2016-01-05 NOTE — Therapy (Signed)
[facility] [place], [place], [address] Phone: [phone]   Fax:  [phone]  Physical Therapy Treatment  Patient Details  Name: Elizabeth [name] MRN: [id number] Date of Birth: 1961-10-17 Referring Provider: Iona Hansen, MD  Encounter Date: 01/05/2016      PT End of Session - 01/05/16 1549    Visit Number 3   Number of Visits 12   Date for PT Re-Evaluation 02/05/16   PT Start Time 0309   PT Stop Time 0354   PT Time Calculation (min) 45 min      Past Medical History  Diagnosis Date  . Crohn's disease ([place])   . Elevated transaminase level   . Hypertension   . Bronchitis   . Cough 01/28/2014    upper airway cough syndrome  . Diverticulitis   . Tobacco abuse   . Obesity   . GERD (gastroesophageal reflux disease)   . Pneumonia 1/15    Past Surgical History  Procedure Laterality Date  . Hemocolectomy  2005    right  . Abdominal hysterectomy  1980  . Appendectomy    . Colonoscopy    . Esophagogastroduodenoscopy  06/16/2012    Procedure: ESOPHAGOGASTRODUODENOSCOPY (EGD);  Surgeon: Rogene Houston, MD;  [facility];  Service: Endoscopy;  Laterality: N/A;  10:30 AM  . Colonoscopy N/A 08/16/2014    Procedure: COLONOSCOPY;  Surgeon: Rogene Houston, MD;  [facility];  Service: Endoscopy;  Laterality: N/A;  1200-rescheduled to 9/4 @ 10:45 Ann notified pt  . Laparoscopic sigmoid colectomy N/A 01/07/2015    Procedure: LOW ANTERIOR COLON RESECTION;  Surgeon: Fanny Skates, MD;  Location: [place];  Service: General;  Laterality: N/A;    There were no vitals filed for this visit.  Visit Diagnosis:  Pain in joint of right shoulder  Stiffness of right shoulder joint  Debility  Weakness      Subjective Assessment - 01/05/16 1544    Subjective Took day off from work to rest shoulder.  Feels better just from not using it.   Pain Score 4    Pain Location Shoulder   Pain Orientation  Right;Anterior;Posterior   Pain Descriptors / Indicators Aching   Pain Type Acute pain                         [facility] Adult PT Treatment/Exercise - 01/05/16 0001    Modalities   Modalities Ultrasound   Electrical Stimulation   Electrical Stimulation Location Right shoulder posterior cuff and acromial ridge.   Electrical Stimulation Action Low-level (output 7)  pre-mod e 'stim x 10 minutes (5 sec on and 5 sec off) x 10 minutes.   Electrical Stimulation Goals Pain   Ultrasound   Ultrasound Location Right shoulder posterior cuff and acrromial ridge   Ultrasound Parameters 1.50 W/CM2 x 12 minutes.   Manual Therapy   Soft tissue mobilization STW/M to right shoulder posterior cuff, acromial ridge and proximal biceps x 11 minutes.                  PT Short Term Goals - 12/25/15 1709    PT SHORT TERM GOAL #1   Title I with inital HEP   Time 2   Period Weeks   Status New   PT SHORT TERM GOAL #2   Title decrease pain in R shoulder by 25% with ADLS.   Time 2   Period Weeks   Status  New           PT Long Term Goals - 12/25/15 1710    PT LONG TERM GOAL #1   Title Able to perform ADLs including work requirements with R shoulder pain 2/10 or less.   Time 6   Period Weeks   Status New   PT LONG TERM GOAL #2   Title Demo active R shoulder flex to 165 deg to improve function.   Time 6   Period Weeks   Status New   PT LONG TERM GOAL #3   Title Demo 5/5 R shoulder strength   Time 6   Period Weeks   Status New               Plan - 01/05/16 1549    PT Next Visit Plan Begin pulleys, UE Ranger; wall ladder and AAROM in supine.        Problem List Patient Active Problem List   Diagnosis Date Noted  . Diverticulitis large intestine 01/07/2015  . Diverticulitis 07/03/2014  . Hypokalemia 07/03/2014  . Tobacco abuse   . Obesity   . Upper airway cough syndrome 01/28/2014  . IBS (irritable bowel syndrome) 07/10/2013  . Acute bilateral lower  abdominal pain 03/26/2013  . Tiredness 03/26/2013  . Sigmoid diverticulitis 09/12/2012  . Epigastric pain 05/22/2012  . Nausea and vomiting 01/28/2012  . Crohn's disease of ileum ([place]) 01/10/2012  . GERD (gastroesophageal reflux disease) 01/10/2012  . HTN (hypertension) 01/10/2012    Alfonso Carden, [place] MPT 01/05/2016, 3:54 PM  [facility] [address] [place], [place], [address] Phone: [phone]   Fax:  [phone]  Name: Elizabeth [name] MRN: [id number] Date of Birth: 07/10/1961

## 2016-01-06 ENCOUNTER — Ambulatory Visit: Payer: PRIVATE HEALTH INSURANCE | Admitting: Physical Therapy

## 2016-01-06 DIAGNOSIS — M25511 Pain in right shoulder: Secondary | ICD-10-CM

## 2016-01-06 DIAGNOSIS — R5381 Other malaise: Secondary | ICD-10-CM

## 2016-01-06 DIAGNOSIS — R531 Weakness: Secondary | ICD-10-CM

## 2016-01-06 DIAGNOSIS — M25611 Stiffness of right shoulder, not elsewhere classified: Secondary | ICD-10-CM

## 2016-01-06 NOTE — Therapy (Signed)
[facility] [place], [place], [address] Phone: [phone]   Fax:  [phone]  Physical Therapy Treatment  Patient Details  Name: Elizabeth [name] MRN: [id number] Date of Birth: Dec 30, 1960 Referring Provider: Iona Hansen, MD  Encounter Date: 01/06/2016      PT End of Session - 01/05/16 1549    Visit Number 3   Number of Visits 12   Date for PT Re-Evaluation 02/05/16   PT Start Time 0309   PT Stop Time 0354   PT Time Calculation (min) 45 min      Past Medical History  Diagnosis Date  . Crohn's disease ([facility])   . Elevated transaminase level   . Hypertension   . Bronchitis   . Cough 01/28/2014    upper airway cough syndrome  . Diverticulitis   . Tobacco abuse   . Obesity   . GERD (gastroesophageal reflux disease)   . Pneumonia 1/15    Past Surgical History  Procedure Laterality Date  . Hemocolectomy  2005    right  . Abdominal hysterectomy  1980  . Appendectomy    . Colonoscopy    . Esophagogastroduodenoscopy  06/16/2012    Procedure: ESOPHAGOGASTRODUODENOSCOPY (EGD);  Surgeon: Rogene Houston, MD;  [facility];  Service: Endoscopy;  Laterality: N/A;  10:30 AM  . Colonoscopy N/A 08/16/2014    Procedure: COLONOSCOPY;  Surgeon: Rogene Houston, MD;  [facility];  Service: Endoscopy;  Laterality: N/A;  1200-rescheduled to 9/4 @ 10:45 Ann notified pt  . Laparoscopic sigmoid colectomy N/A 01/07/2015    Procedure: LOW ANTERIOR COLON RESECTION;  Surgeon: Fanny Skates, MD;  Location: [facility];  Service: General;  Laterality: N/A;    There were no vitals filed for this visit.  Visit Diagnosis:  Pain in joint of right shoulder  Stiffness of right shoulder joint  Debility  Weakness      Subjective Assessment - 01/06/16 0349    Subjective Certain movements feels like a ripping pain and then I get severe muscle spasms that last about 5 minutes.  During these times the pain is severe.  It  stays sore.   Diagnostic tests mri, xray; results: mod RC tendinopathy/tendinosis, shallow tears of supraspinatus (no full thickness tears); ant/inf labral tear; mod GH degenerative changes                         [facility] Adult PT Treatment/Exercise - 01/06/16 0001    Exercises   Exercises Shoulder   Shoulder Exercises: Pulleys   Flexion --  4 minutes.   Other Pulley Exercises UE Ranger into flexion x 1 minute.   Shoulder Exercises: ROM/Strengthening   UBE (Upper Arm Bike) 8 minutes at 120 RPM's   Modalities   Modalities Ultrasound   Electrical Stimulation   Electrical Stimulation Location Right shoulder   Electrical Stimulation Action Low-level (output 4) x 10 minutes.   Electrical Stimulation Goals Pain   Ultrasound   Ultrasound Location Right shoulder   Ultrasound Parameters 1.20 W/CM2 x 10 minutes.                  PT Short Term Goals - 12/25/15 1709    PT SHORT TERM GOAL #1   Title I with inital HEP   Time 2   Period Weeks   Status New   PT SHORT TERM GOAL #2   Title decrease pain in R shoulder by 25% with ADLS.  Time 2   Period Weeks   Status New           PT Long Term Goals - 12/25/15 1710    PT LONG TERM GOAL #1   Title Able to perform ADLs including work requirements with R shoulder pain 2/10 or less.   Time 6   Period Weeks   Status New   PT LONG TERM GOAL #2   Title Demo active R shoulder flex to 165 deg to improve function.   Time 6   Period Weeks   Status New   PT LONG TERM GOAL #3   Title Demo 5/5 R shoulder strength   Time 6   Period Weeks   Status New               Plan - 01/06/16 1613    Clinical Impression Statement Certain movements feels like a ripping pain and then I get severe muscle spasms that last about 5 minutes.  During these times the pain is severe.  It stays sore.  During these times her pain is near a 10/10.   Pt will benefit from skilled therapeutic intervention in order to improve on the  following deficits Decreased range of motion;Impaired UE functional use;Decreased activity tolerance;Pain;Decreased strength;Postural dysfunction   Rehab Potential Fair   PT Frequency 3x / week   PT Duration 6 weeks   PT Treatment/Interventions ADLs/Self Care Home Management;Cryotherapy;Electrical Stimulation;Moist Heat;Therapeutic exercise;Ultrasound;Neuromuscular re-education;Patient/family education;Manual techniques;Vasopneumatic Device;Passive range of motion   PT Next Visit Plan Begin pulleys, UE Ranger; wall ladder and AAROM in supine.   PT Home Exercise Plan shoulder isometrics   Consulted and Agree with Plan of Care Patient        Problem List Patient Active Problem List   Diagnosis Date Noted  . Diverticulitis large intestine 01/07/2015  . Diverticulitis 07/03/2014  . Hypokalemia 07/03/2014  . Tobacco abuse   . Obesity   . Upper airway cough syndrome 01/28/2014  . IBS (irritable bowel syndrome) 07/10/2013  . Acute bilateral lower abdominal pain 03/26/2013  . Tiredness 03/26/2013  . Sigmoid diverticulitis 09/12/2012  . Epigastric pain 05/22/2012  . Nausea and vomiting 01/28/2012  . Crohn's disease of ileum ([facility]) 01/10/2012  . GERD (gastroesophageal reflux disease) 01/10/2012  . HTN (hypertension) 01/10/2012    Harlen Danford, [place] MPT 01/06/2016, 5:07 PM  [facility] [place], [place], [address] Phone: [phone]   Fax:  [phone]  Name: Elizabeth [name] MRN: [id number] Date of Birth: December 04, 1961

## 2016-01-08 ENCOUNTER — Encounter: Payer: PRIVATE HEALTH INSURANCE | Admitting: Physical Therapy

## 2016-01-13 ENCOUNTER — Encounter: Payer: PRIVATE HEALTH INSURANCE | Admitting: Physical Therapy

## 2016-01-14 ENCOUNTER — Other Ambulatory Visit (HOSPITAL_COMMUNITY): Payer: Self-pay | Admitting: Orthopedic Surgery

## 2016-01-15 ENCOUNTER — Encounter: Payer: PRIVATE HEALTH INSURANCE | Admitting: Physical Therapy

## 2016-01-15 ENCOUNTER — Telehealth: Payer: Self-pay | Admitting: Orthopaedic Surgery

## 2016-01-15 ENCOUNTER — Encounter: Payer: Self-pay | Admitting: Orthopaedic Surgery

## 2016-01-15 NOTE — Telephone Encounter (Signed)
Patient called and requested out of work note.  Patient was referred to [facility] and was seen on 01-13-16.  She is scheduled for surgery by Dr.Deans on 01-20-16.  She states that she did not receive an out of work note from [place] therefore Dr. Luna Glasgow has approved an extended out of work note through Monday, 01-19-16.  Called the patient to notify work note is ready to be picked up.

## 2016-01-19 ENCOUNTER — Encounter (HOSPITAL_COMMUNITY)
Admission: RE | Admit: 2016-01-19 | Discharge: 2016-01-19 | Disposition: A | Discharge status: Home / Self Care | Payer: PRIVATE HEALTH INSURANCE | Source: Ambulatory Visit | Attending: Orthopedic Surgery | Admitting: Orthopedic Surgery

## 2016-01-19 ENCOUNTER — Encounter (HOSPITAL_COMMUNITY): Payer: Self-pay

## 2016-01-19 DIAGNOSIS — K5090 Crohn's disease, unspecified, without complications: Secondary | ICD-10-CM | POA: Diagnosis not present

## 2016-01-19 DIAGNOSIS — X509XXA Other and unspecified overexertion or strenuous movements or postures, initial encounter: Secondary | ICD-10-CM | POA: Diagnosis not present

## 2016-01-19 DIAGNOSIS — S43491A Other sprain of right shoulder joint, initial encounter: Secondary | ICD-10-CM | POA: Diagnosis present

## 2016-01-19 DIAGNOSIS — I10 Essential (primary) hypertension: Secondary | ICD-10-CM | POA: Diagnosis not present

## 2016-01-19 DIAGNOSIS — K219 Gastro-esophageal reflux disease without esophagitis: Secondary | ICD-10-CM | POA: Diagnosis not present

## 2016-01-19 DIAGNOSIS — F17210 Nicotine dependence, cigarettes, uncomplicated: Secondary | ICD-10-CM | POA: Diagnosis not present

## 2016-01-19 DIAGNOSIS — Z9049 Acquired absence of other specified parts of digestive tract: Secondary | ICD-10-CM | POA: Diagnosis not present

## 2016-01-19 DIAGNOSIS — E669 Obesity, unspecified: Secondary | ICD-10-CM | POA: Diagnosis not present

## 2016-01-19 DIAGNOSIS — Z79899 Other long term (current) drug therapy: Secondary | ICD-10-CM | POA: Diagnosis not present

## 2016-01-19 DIAGNOSIS — Z6831 Body mass index (BMI) 31.0-31.9, adult: Secondary | ICD-10-CM | POA: Diagnosis not present

## 2016-01-19 DIAGNOSIS — M19011 Primary osteoarthritis, right shoulder: Secondary | ICD-10-CM | POA: Diagnosis not present

## 2016-01-19 LAB — BASIC METABOLIC PANEL
Anion gap: 13 (ref 5–15)
BUN: 14 mg/dL (ref 6–20)
CHLORIDE: 101 mmol/L (ref 101–111)
CO2: 27 mmol/L (ref 22–32)
Calcium: 9.1 mg/dL (ref 8.9–10.3)
Creatinine, Ser: 0.7 mg/dL (ref 0.44–1.00)
GFR calc Af Amer: >60 mL/min (ref >60)
GFR calc non Af Amer: >60 mL/min (ref >60)
GLUCOSE: 94 mg/dL (ref 65–99)
Potassium: 3.5 mmol/L (ref 3.5–5.1)
Sodium: 141 mmol/L (ref 135–145)

## 2016-01-19 LAB — CBC
HCT: 45.2 % (ref 36.0–46.0)
Hemoglobin: 16.1 g/dL — ABNORMAL HIGH (ref 12.0–15.0)
MCH: 36 pg — ABNORMAL HIGH (ref 26.0–34.0)
MCHC: 35.6 g/dL (ref 30.0–36.0)
MCV: 101.1 fL — ABNORMAL HIGH (ref 78.0–100.0)
Platelets: 228 K/uL (ref 150–400)
RBC: 4.47 MIL/uL (ref 3.87–5.11)
RDW: 13.3 % (ref 11.5–15.5)
WBC: 9.1 K/uL (ref 4.0–10.5)

## 2016-01-19 MED ORDER — CHLORHEXIDINE GLUCONATE 4 % EX LIQD
60.0000 mL | Freq: Once | CUTANEOUS | Status: DC
Start: 2016-01-20 — End: 2016-01-20

## 2016-01-19 MED ORDER — CHLORHEXIDINE GLUCONATE 4 % EX LIQD: 60.0000 mL | Freq: Once | CUTANEOUS | Status: DC

## 2016-01-19 MED ORDER — CLINDAMYCIN PHOSPHATE 900 MG/50ML IV SOLN
900.0000 mg | INTRAVENOUS | Status: AC
  Administered 2016-01-20: 900 mg via INTRAVENOUS
  Filled 2016-01-19 (×2): qty 50

## 2016-01-19 NOTE — Anesthesia Preprocedure Evaluation (Addendum)
Anesthesia Evaluation  Patient identified by MRN, date of birth, ID band Patient awake    Reviewed: Allergy & Precautions, NPO status , Patient's Chart, lab work & pertinent test results  History of Anesthesia Complications Negative for: history of anesthetic complications  Airway Mallampati: II  TM Distance: >3 FB     Dental  (+) Teeth Intact   Pulmonary neg shortness of breath, neg sleep apnea, neg COPD, neg recent URI, Current Smoker,    breath sounds clear to auscultation       Cardiovascular hypertension, Pt. on medications (-) angina(-) Past MI and (-) CHF  Rhythm:Regular     Neuro/Psych negative neurological ROS  negative psych ROS   GI/Hepatic Neg liver ROS, GERD  Medicated,Crohn's disease s/p small bowel resection   Endo/Other    Renal/GU negative Renal ROS     Musculoskeletal   Abdominal (+) + obese,   Peds  Hematology negative hematology ROS (+)   Anesthesia Other Findings   Reproductive/Obstetrics                           BP Readings from Last 3 Encounters:  01/19/16 110/48  02/17/15 110/68  01/13/15 136/80   CBC    Component Value Date/Time   WBC 9.1 01/19/2016 1103   RBC 4.47 01/19/2016 1103   HGB 16.1* 01/19/2016 1103   HCT 45.2 01/19/2016 1103   PLT 228 01/19/2016 1103   MCV 101.1* 01/19/2016 1103   MCH 36.0* 01/19/2016 1103   MCHC 35.6 01/19/2016 1103   RDW 13.3 01/19/2016 1103   LYMPHSABS 1.2 01/10/2015 0708   MONOABS 0.7 01/10/2015 0708   EOSABS 0.1 01/10/2015 0708   BASOSABS 0.0 01/10/2015 0708      Chemistry      Component Value Date/Time   NA 141 01/19/2016 1103   K 3.5 01/19/2016 1103   CL 101 01/19/2016 1103   CO2 27 01/19/2016 1103   BUN 14 01/19/2016 1103   CREATININE 0.70 01/19/2016 1103   CREATININE 0.79 03/26/2013 1642      Component Value Date/Time   CALCIUM 9.1 01/19/2016 1103   ALKPHOS 65 12/27/2014 0936   AST 26 12/27/2014 0936    ALT 28 12/27/2014 0936   BILITOT 0.7 12/27/2014 0936     01/19/16: Normal sinus rhythm Inferior infarct , age undetermined ST & T wave abnormality, consider anterior ischemia Abnormal ECG  Anesthesia Physical Anesthesia Plan  ASA: II  Anesthesia Plan: General and Regional   Post-op Pain Management: MAC Combined w/ Regional for Post-op pain   Induction: Intravenous  Airway Management Planned: Oral ETT  Additional Equipment: None  Intra-op Plan:   Post-operative Plan: Extubation in OR  Informed Consent: I have reviewed the patients History and Physical, chart, labs and discussed the procedure including the risks, benefits and alternatives for the proposed anesthesia with the patient or authorized representative who has indicated his/her understanding and acceptance.   Dental advisory given  Plan Discussed with: Anesthesiologist and CRNA  Anesthesia Plan Comments:       Anesthesia Quick Evaluation

## 2016-01-19 NOTE — Progress Notes (Signed)
Anesthesia Chart Review: Patient is a 54 year old female scheduled for right shoulder arthroscopic labral repair possible biceps tenodesis on 01/20/2016 by Dr. Marlou Sa.  History includes smoking, hypertension, Crohn's disease, diverticulitis s/p laparoscopic LOA with open low anterior colon resection 01/07/15, hysterectomy BMI is consistent with mild obesity. PCP is Dr. Eula Fried.  Meds include Bentyl, Norco, mesalamine, omeprazole, Micro-K, Diovan-HCT.  01/19/16 EKG: NSR, inferior infarct (age undetermined), ST/T wave abnormality, consider anterior ischemia. Overall, I think her EKG is stable when compared to 12/27/14 tracing. T wave abnormality was also present on 01/21/14 tracing but are more prominent on her 2016 and 2017 tracings.  Preoperative labs noted.   Patient's EKG appears similar to her 12/2014 tracing in which she underwent colon resection. Per her PAT RN, no CP/SOB reported at her PAT visit today. Discussed with anesthesiologist Dr. Veatrice Kells. If she remains asymptomatic from a CV standpoint then it is anticipated that she can proceed as planned.  George Hugh [facility] Short Stay Center/Anesthesiology Phone [phone] 01/19/2016 1:06 PM

## 2016-01-19 NOTE — Pre-Procedure Instructions (Signed)
    Elizabeth [name]  01/19/2016      [facility] - [place], [place] - [address] [place] [address] Phone: [phone] Fax: [phone]    Your procedure is scheduled on 01/20/16.  Report to [facility] Admitting at 530 A.M.  Call this number if you have problems the morning of surgery:  [phone]   Remember:  Do not eat food or drink liquids after midnight.  Take these medicines the morning of surgery with A SIP OF WATER --tylenol,hydrocodone,prilosec   Do not wear jewelry, make-up or nail polish.  Do not wear lotions, powders, or perfumes.  You may wear deodorant.  Do not shave 48 hours prior to surgery.  Men may shave face and neck.  Do not bring valuables to the hospital.  [facility] is not responsible for any belongings or valuables.  Contacts, dentures or bridgework may not be worn into surgery.  Leave your suitcase in the car.  After surgery it may be brought to your room.  For patients admitted to the hospital, discharge time will be determined by your treatment team.  Patients discharged the day of surgery will not be allowed to drive home.   Name and phone number of your driver:   Special instructions:   Please read over the following fact sheets that you were given. Pain Booklet, Coughing and Deep Breathing and Surgical Site Infection Prevention

## 2016-01-20 ENCOUNTER — Ambulatory Visit (HOSPITAL_COMMUNITY): Payer: PRIVATE HEALTH INSURANCE | Admitting: Vascular Surgery

## 2016-01-20 ENCOUNTER — Encounter (HOSPITAL_COMMUNITY)
Admission: RE | Disposition: A | Discharge status: Home / Self Care | Payer: Self-pay | Source: Ambulatory Visit | Attending: Orthopedic Surgery

## 2016-01-20 ENCOUNTER — Encounter (HOSPITAL_COMMUNITY): Payer: Self-pay | Admitting: General Practice

## 2016-01-20 ENCOUNTER — Ambulatory Visit (HOSPITAL_COMMUNITY): Payer: PRIVATE HEALTH INSURANCE | Admitting: Certified Registered Nurse Anesthetist

## 2016-01-20 ENCOUNTER — Ambulatory Visit (HOSPITAL_COMMUNITY)
Admission: RE | Admit: 2016-01-20 | Discharge: 2016-01-20 | Disposition: A | Discharge status: Home / Self Care | Payer: PRIVATE HEALTH INSURANCE | Source: Ambulatory Visit | Attending: Orthopedic Surgery | Admitting: Orthopedic Surgery

## 2016-01-20 DIAGNOSIS — Z9049 Acquired absence of other specified parts of digestive tract: Secondary | ICD-10-CM | POA: Insufficient documentation

## 2016-01-20 DIAGNOSIS — K219 Gastro-esophageal reflux disease without esophagitis: Secondary | ICD-10-CM | POA: Insufficient documentation

## 2016-01-20 DIAGNOSIS — Z79899 Other long term (current) drug therapy: Secondary | ICD-10-CM | POA: Insufficient documentation

## 2016-01-20 DIAGNOSIS — Z6831 Body mass index (BMI) 31.0-31.9, adult: Secondary | ICD-10-CM | POA: Insufficient documentation

## 2016-01-20 DIAGNOSIS — X509XXA Other and unspecified overexertion or strenuous movements or postures, initial encounter: Secondary | ICD-10-CM | POA: Insufficient documentation

## 2016-01-20 DIAGNOSIS — I10 Essential (primary) hypertension: Secondary | ICD-10-CM | POA: Insufficient documentation

## 2016-01-20 DIAGNOSIS — E669 Obesity, unspecified: Secondary | ICD-10-CM | POA: Insufficient documentation

## 2016-01-20 DIAGNOSIS — M19011 Primary osteoarthritis, right shoulder: Secondary | ICD-10-CM | POA: Insufficient documentation

## 2016-01-20 DIAGNOSIS — K5090 Crohn's disease, unspecified, without complications: Secondary | ICD-10-CM | POA: Insufficient documentation

## 2016-01-20 DIAGNOSIS — S43491A Other sprain of right shoulder joint, initial encounter: Secondary | ICD-10-CM | POA: Diagnosis not present

## 2016-01-20 DIAGNOSIS — F17210 Nicotine dependence, cigarettes, uncomplicated: Secondary | ICD-10-CM | POA: Insufficient documentation

## 2016-01-20 HISTORY — PX: SHOULDER ARTHROSCOPY WITH LABRAL REPAIR: SHX5691

## 2016-01-20 SURGERY — ARTHROSCOPY, SHOULDER, WITH GLENOID LABRUM REPAIR
Anesthesia: Regional | Site: Shoulder | Laterality: Right

## 2016-01-20 MED ORDER — DEXAMETHASONE SODIUM PHOSPHATE 4 MG/ML IJ SOLN
INTRAMUSCULAR | Status: AC
  Filled 2016-01-20: qty 1

## 2016-01-20 MED ORDER — BUPIVACAINE HCL (PF) 0.25 % IJ SOLN
INTRAMUSCULAR | Status: AC
  Filled 2016-01-20: qty 30

## 2016-01-20 MED ORDER — SUGAMMADEX SODIUM 200 MG/2ML IV SOLN
INTRAVENOUS | Status: DC | PRN
  Administered 2016-01-20: 180 mg via INTRAVENOUS

## 2016-01-20 MED ORDER — LIDOCAINE HCL (CARDIAC) 20 MG/ML IV SOLN
INTRAVENOUS | Status: DC | PRN
  Administered 2016-01-20: 60 mg via INTRAVENOUS

## 2016-01-20 MED ORDER — GLYCOPYRROLATE 0.2 MG/ML IJ SOLN
INTRAMUSCULAR | Status: AC
  Filled 2016-01-20: qty 1

## 2016-01-20 MED ORDER — ROCURONIUM BROMIDE 50 MG/5ML IV SOLN
INTRAVENOUS | Status: AC
  Filled 2016-01-20: qty 1

## 2016-01-20 MED ORDER — ACETAMINOPHEN 160 MG/5ML PO SOLN: 325.0000 mg | ORAL | Status: DC | PRN

## 2016-01-20 MED ORDER — SODIUM CHLORIDE 0.9 % IR SOLN
Status: DC | PRN
  Administered 2016-01-20: 18000 mL

## 2016-01-20 MED ORDER — SODIUM CHLORIDE 0.9 % IJ SOLN
INTRAMUSCULAR | Status: AC
  Filled 2016-01-20: qty 10

## 2016-01-20 MED ORDER — SODIUM CHLORIDE 0.9 % IR SOLN
Status: DC | PRN
  Administered 2016-01-20: 1000 mL

## 2016-01-20 MED ORDER — SODIUM CHLORIDE 0.9 % IJ SOLN: INTRAMUSCULAR | Status: DC | PRN

## 2016-01-20 MED ORDER — BUPIVACAINE-EPINEPHRINE (PF) 0.5% -1:200000 IJ SOLN
INTRAMUSCULAR | Status: DC | PRN
  Administered 2016-01-20: 25 mL via PERINEURAL

## 2016-01-20 MED ORDER — SUCCINYLCHOLINE CHLORIDE 20 MG/ML IJ SOLN
INTRAMUSCULAR | Status: AC
  Filled 2016-01-20: qty 1

## 2016-01-20 MED ORDER — EPINEPHRINE HCL (NASAL) 0.1 % NA SOLN
NASAL | Status: AC
  Filled 2016-01-20: qty 30

## 2016-01-20 MED ORDER — LIDOCAINE HCL (CARDIAC) 20 MG/ML IV SOLN
INTRAVENOUS | Status: AC
  Filled 2016-01-20: qty 5

## 2016-01-20 MED ORDER — OXYCODONE HCL 5 MG/5ML PO SOLN: 5.0000 mg | Freq: Once | ORAL | Status: DC | PRN

## 2016-01-20 MED ORDER — FENTANYL CITRATE (PF) 250 MCG/5ML IJ SOLN
INTRAMUSCULAR | Status: AC
  Filled 2016-01-20: qty 5

## 2016-01-20 MED ORDER — HYDROMORPHONE HCL 1 MG/ML IJ SOLN: 0.2500 mg | INTRAMUSCULAR | Status: DC | PRN

## 2016-01-20 MED ORDER — ROCURONIUM BROMIDE 100 MG/10ML IV SOLN
INTRAVENOUS | Status: DC | PRN
  Administered 2016-01-20: 50 mg via INTRAVENOUS

## 2016-01-20 MED ORDER — METHOCARBAMOL 500 MG PO TABS: 500.0000 mg | ORAL_TABLET | Freq: Three times a day (TID) | ORAL | Status: DC

## 2016-01-20 MED ORDER — ONDANSETRON HCL 4 MG/2ML IJ SOLN
INTRAMUSCULAR | Status: AC
  Filled 2016-01-20: qty 2

## 2016-01-20 MED ORDER — SUGAMMADEX SODIUM 200 MG/2ML IV SOLN
INTRAVENOUS | Status: AC
  Filled 2016-01-20: qty 2

## 2016-01-20 MED ORDER — LACTATED RINGERS IV SOLN
INTRAVENOUS | Status: DC | PRN
  Administered 2016-01-20 (×2): via INTRAVENOUS

## 2016-01-20 MED ORDER — EPHEDRINE SULFATE 50 MG/ML IJ SOLN
INTRAMUSCULAR | Status: AC
  Filled 2016-01-20: qty 1

## 2016-01-20 MED ORDER — PHENYLEPHRINE 40 MCG/ML (10ML) SYRINGE FOR IV PUSH (FOR BLOOD PRESSURE SUPPORT)
PREFILLED_SYRINGE | INTRAVENOUS | Status: AC
  Filled 2016-01-20: qty 10

## 2016-01-20 MED ORDER — PROPOFOL 10 MG/ML IV BOLUS
INTRAVENOUS | Status: AC
  Filled 2016-01-20: qty 40

## 2016-01-20 MED ORDER — MIDAZOLAM HCL 5 MG/5ML IJ SOLN
INTRAMUSCULAR | Status: DC | PRN
  Administered 2016-01-20 (×2): 1 mg via INTRAVENOUS

## 2016-01-20 MED ORDER — EPINEPHRINE HCL 1 MG/ML IJ SOLN
INTRAMUSCULAR | Status: AC
  Filled 2016-01-20: qty 1

## 2016-01-20 MED ORDER — OXYCODONE HCL 5 MG PO TABS: 5.0000 mg | ORAL_TABLET | Freq: Once | ORAL | Status: DC | PRN

## 2016-01-20 MED ORDER — PHENYLEPHRINE HCL 10 MG/ML IJ SOLN
10.0000 mg | INTRAVENOUS | Status: DC | PRN
  Administered 2016-01-20: 15 ug/min via INTRAVENOUS

## 2016-01-20 MED ORDER — FENTANYL CITRATE (PF) 100 MCG/2ML IJ SOLN
INTRAMUSCULAR | Status: DC | PRN
  Administered 2016-01-20: 100 ug via INTRAVENOUS
  Administered 2016-01-20: 50 ug via INTRAVENOUS

## 2016-01-20 MED ORDER — ACETAMINOPHEN 325 MG PO TABS: 325.0000 mg | ORAL_TABLET | ORAL | Status: DC | PRN

## 2016-01-20 MED ORDER — MIDAZOLAM HCL 2 MG/2ML IJ SOLN
INTRAMUSCULAR | Status: AC
  Filled 2016-01-20: qty 2

## 2016-01-20 MED ORDER — BUPIVACAINE HCL (PF) 0.25 % IJ SOLN: INTRAMUSCULAR | Status: DC | PRN

## 2016-01-20 MED ORDER — EPINEPHRINE HCL 1 MG/ML IJ SOLN: INTRAMUSCULAR | Status: DC | PRN

## 2016-01-20 MED ORDER — PROPOFOL 10 MG/ML IV BOLUS
INTRAVENOUS | Status: DC | PRN
  Administered 2016-01-20: 130 mg via INTRAVENOUS

## 2016-01-20 MED ORDER — OXYCODONE-ACETAMINOPHEN 5-325 MG PO TABS: 1.0000 | ORAL_TABLET | Freq: Four times a day (QID) | ORAL | Status: DC | PRN

## 2016-01-20 MED ORDER — DEXAMETHASONE SODIUM PHOSPHATE 4 MG/ML IJ SOLN
INTRAMUSCULAR | Status: DC | PRN
  Administered 2016-01-20: 4 mg via INTRAVENOUS

## 2016-01-20 SURGICAL SUPPLY — 86 items
ANCH SUT SHRT 12.5 CANN EYLT (Anchor) ×3 IMPLANT
ANCHOR SUT BIOCOMP LK 2.9X12.5 (Anchor) ×6 IMPLANT
APL SKNCLS STERI-STRIP NONHPOA (GAUZE/BANDAGES/DRESSINGS) ×1
BENZOIN TINCTURE PRP APPL 2/3 (GAUZE/BANDAGES/DRESSINGS) ×3 IMPLANT
BIT DRILL TAK (DRILL) IMPLANT
BLADE CUDA 5.5 (BLADE) IMPLANT
BLADE CUTTER GATOR 3.5 (BLADE) ×3 IMPLANT
BLADE GREAT WHITE 4.2 (BLADE) ×2 IMPLANT
BLADE GREAT WHITE 4.2MM (BLADE) ×1
BLADE SURG 11 STRL SS (BLADE) ×3 IMPLANT
BUR GATOR 2.9 (BURR) IMPLANT
BUR GATOR 2.9MM (BURR)
BUR OVAL 6.0 (BURR) ×3 IMPLANT
CANNULA 5.75X71 LONG (CANNULA) ×2 IMPLANT
CANNULA SHOULDER 7CM (CANNULA) IMPLANT
CANNULA TWIST IN 8.25X7CM (CANNULA) ×2 IMPLANT
CLOSURE WOUND 1/2 X4 (GAUZE/BANDAGES/DRESSINGS) ×1
COVER SURGICAL LIGHT HANDLE (MISCELLANEOUS) ×3 IMPLANT
DISTRACTOR SHOULDER 3 POINT (INSTRUMENTS) ×2 IMPLANT
DRAPE INCISE IOBAN 66X45 STRL (DRAPES) ×6 IMPLANT
DRAPE STERI 35X30 U-POUCH (DRAPES) ×3 IMPLANT
DRAPE U-SHAPE 47X51 STRL (DRAPES) ×6 IMPLANT
DRILL TAK (DRILL)
DRSG PAD ABDOMINAL 8X10 ST (GAUZE/BANDAGES/DRESSINGS) ×9 IMPLANT
DRSG TEGADERM 4X4.75 (GAUZE/BANDAGES/DRESSINGS) ×2 IMPLANT
DURAPREP 26ML APPLICATOR (WOUND CARE) ×3 IMPLANT
ELECT MENISCUS 165MM 90D (ELECTRODE) IMPLANT
ELECT REM PT RETURN 9FT ADLT (ELECTROSURGICAL) ×3
ELECTRODE REM PT RTRN 9FT ADLT (ELECTROSURGICAL) ×1 IMPLANT
FIBERSTICK 2 (SUTURE) ×2 IMPLANT
FILTER STRAW FLUID ASPIR (MISCELLANEOUS) ×3 IMPLANT
FLUID NSS /IRRIG 3000 ML XXX (IV SOLUTION) ×12 IMPLANT
GAUZE SPONGE 4X4 12PLY STRL (GAUZE/BANDAGES/DRESSINGS) ×3 IMPLANT
GAUZE XEROFORM 1X8 LF (GAUZE/BANDAGES/DRESSINGS) ×3 IMPLANT
GLOVE BIO SURGEON ST LM GN SZ9 (GLOVE) ×3 IMPLANT
GLOVE BIOGEL PI IND STRL 8 (GLOVE) ×1 IMPLANT
GLOVE BIOGEL PI INDICATOR 8 (GLOVE) ×2
GLOVE SURG ORTHO 8.0 STRL STRW (GLOVE) ×3 IMPLANT
GOWN STRL REUS W/ TWL LRG LVL3 (GOWN DISPOSABLE) ×3 IMPLANT
GOWN STRL REUS W/TWL LRG LVL3 (GOWN DISPOSABLE) ×9
KIT BASIN OR (CUSTOM PROCEDURE TRAY) ×3 IMPLANT
KIT DISPOSABLE PUSHLOCK 2.9MM (KITS) IMPLANT
KIT PUSHLOCK 2.9 HIP (KITS) ×2 IMPLANT
KIT ROOM TURNOVER OR (KITS) ×3 IMPLANT
LASSO 90 CVE QUICKPAS (DISPOSABLE) ×2 IMPLANT
LASSO CRESCENT QUICKPASS (SUTURE) IMPLANT
LASSO SUT 90 DEGREE (SUTURE) IMPLANT
MANIFOLD NEPTUNE II (INSTRUMENTS) ×3 IMPLANT
NDL HYPO 25X1 1.5 SAFETY (NEEDLE) ×1 IMPLANT
NDL SPNL 18GX3.5 QUINCKE PK (NEEDLE) ×1 IMPLANT
NDL SUT 6 .5 CRC .975X.05 MAYO (NEEDLE) ×1 IMPLANT
NEEDLE HYPO 25X1 1.5 SAFETY (NEEDLE) ×3 IMPLANT
NEEDLE MAYO TAPER (NEEDLE) ×3
NEEDLE SPNL 18GX3.5 QUINCKE PK (NEEDLE) ×3 IMPLANT
NS IRRIG 1000ML POUR BTL (IV SOLUTION) ×3 IMPLANT
PACK SHOULDER (CUSTOM PROCEDURE TRAY) ×3 IMPLANT
PAD ARMBOARD 7.5X6 YLW CONV (MISCELLANEOUS) ×6 IMPLANT
SET ARTHROSCOPY TUBING (MISCELLANEOUS) ×3
SET ARTHROSCOPY TUBING LN (MISCELLANEOUS) ×1 IMPLANT
SLING ARM IMMOBILIZER MED (SOFTGOODS) IMPLANT
SPEAR FASTAKII (SLEEVE) IMPLANT
SPONGE LAP 4X18 X RAY DECT (DISPOSABLE) ×6 IMPLANT
STRIP CLOSURE SKIN 1/2X4 (GAUZE/BANDAGES/DRESSINGS) ×2 IMPLANT
SUCTION FRAZIER HANDLE 10FR (MISCELLANEOUS) ×2
SUCTION TUBE FRAZIER 10FR DISP (MISCELLANEOUS) ×1 IMPLANT
SUT ETHILON 3 0 PS 1 (SUTURE) ×5 IMPLANT
SUT FIBERWIRE 2-0 18 17.9 3/8 (SUTURE)
SUT LASSO 45 DEGREE LEFT (SUTURE) IMPLANT
SUT LASSO 45D RIGHT (SUTURE) IMPLANT
SUT PROLENE 3 0 PS 2 (SUTURE) ×3 IMPLANT
SUT VIC AB 0 CT1 27 (SUTURE) ×6
SUT VIC AB 0 CT1 27XBRD ANBCTR (SUTURE) ×2 IMPLANT
SUT VIC AB 1 CT1 27 (SUTURE) ×3
SUT VIC AB 1 CT1 27XBRD ANBCTR (SUTURE) ×1 IMPLANT
SUT VIC AB 2-0 CT1 27 (SUTURE) ×3
SUT VIC AB 2-0 CT1 TAPERPNT 27 (SUTURE) ×1 IMPLANT
SUT VICRYL 0 UR6 27IN ABS (SUTURE) IMPLANT
SUTURE FIBERWR 2-0 18 17.9 3/8 (SUTURE) IMPLANT
SYR 20CC LL (SYRINGE) ×6 IMPLANT
SYR 3ML LL SCALE MARK (SYRINGE) ×3 IMPLANT
SYR TB 1ML LUER SLIP (SYRINGE) ×3 IMPLANT
TAPE SUT LABRALTAP WHT/BLK (SUTURE) ×8 IMPLANT
TOWEL OR 17X24 6PK STRL BLUE (TOWEL DISPOSABLE) ×3 IMPLANT
TOWEL OR 17X26 10 PK STRL BLUE (TOWEL DISPOSABLE) ×3 IMPLANT
WAND HAND CNTRL MULTIVAC 90 (MISCELLANEOUS) ×2 IMPLANT
WATER STERILE IRR 1000ML POUR (IV SOLUTION) ×3 IMPLANT

## 2016-01-20 NOTE — Brief Op Note (Signed)
01/20/2016  10:35 AM  PATIENT:  Elizabeth [name]  55 y.o. female  PRE-OPERATIVE DIAGNOSIS:  RIGHT SHOULDER LABRAL TEAR  POST-OPERATIVE DIAGNOSIS:  RIGHT SHOULDER LABRAL TEAR  PROCEDURE:  Procedure(s): SHOULDER ARTHROSCOPY WITH LABRAL REPAIR and debridement  SURGEON:  Surgeon(s): Meredith Pel, MD  ASSISTANT: Laure Kidney RNFA  ANESTHESIA:   general  EBL: 10 ml    Total I/O In: 1000 [I.V.:1000] Out: 30 [Blood:30]  BLOOD ADMINISTERED: none  DRAINS: none   LOCAL MEDICATIONS USED:  none  SPECIMEN:  No Specimen  COUNTS:  YES  TOURNIQUET:  * No tourniquets in log *  DICTATION: .Other Dictation: Dictation Number [id number]  PLAN OF CARE: Discharge to home after PACU  PATIENT DISPOSITION:  PACU - hemodynamically stable

## 2016-01-20 NOTE — Anesthesia Procedure Notes (Addendum)
Procedure Name: Intubation Date/Time: 01/20/2016 8:10 AM Performed by: Layla Maw Pre-anesthesia Checklist: Patient identified, Patient being monitored, Timeout performed, Emergency Drugs available and Suction available Patient Re-evaluated:Patient Re-evaluated prior to inductionOxygen Delivery Method: Circle System Utilized Preoxygenation: Pre-oxygenation with 100% oxygen Intubation Type: IV induction Ventilation: Mask ventilation without difficulty Laryngoscope Size: Miller and 3 Grade View: Grade I Tube type: Oral Tube size: 7.5 mm Number of attempts: 1 Airway Equipment and Method: Stylet Placement Confirmation: ETT inserted through vocal cords under direct vision,  positive ETCO2 and breath sounds checked- equal and bilateral Secured at: 21 cm Tube secured with: Tape Dental Injury: Teeth and Oropharynx as per pre-operative assessment    Anesthesia Regional Block:  Interscalene brachial plexus block  Pre-Anesthetic Checklist: ,, timeout performed, Correct Patient, Correct Site, Correct Laterality, Correct Procedure, Correct Position, site marked, Risks and benefits discussed,  Surgical consent,  Pre-op evaluation,  At surgeon's request and post-op pain management  Laterality: Upper and Right  Prep: chloraprep       Needles:  Injection technique: Single-shot  Needle Type: Echogenic Stimulator Needle          Additional Needles:  Procedures: ultrasound guided (picture in chart) and nerve stimulator Interscalene brachial plexus block  Nerve Stimulator or Paresthesia:  Response: deltoid, 0.5 mA,   Additional Responses:   Narrative:  Injection made incrementally with aspirations every 5 mL.  Performed by: Personally  Anesthesiologist: Khaleef Ruby  Additional Notes: H+P and labs reviewed, risks and benefits discussed with patient, procedure tolerated well without complications

## 2016-01-20 NOTE — H&P (Signed)
Elizabeth [name] is an 55 y.o. female.   Chief Complaint: Right shoulder pain HPI: Elizabeth [name] is a 55 year old patient with right shoulder pain. She sustained an injury while pushing a cart several months ago. She's had injections and therapy but they haven't helped. Localizes pain primarily to the anterior aspect of the shoulder with some radiation into the biceps. MRI scan shows anterior labral tear but the patient did not have a subluxation or dislocation episode. She did feel a pop when the cart went to the right. It was a heavy cart. MRI scan shows some anterior inferior labral tearing with cyst formation. Superior labrum evaluation is difficult to make a concrete assessment because this is not an arthrogram study. Nonetheless she's had persistent shoulder pain with failure of conservative management and some pathology on MRI scanning. She presents now for operative management with labral repair possible biceps tenodesis  Past Medical History  Diagnosis Date  . Crohn's disease ([place])   . Elevated transaminase level   . Hypertension   . Bronchitis   . Cough 01/28/2014    upper airway cough syndrome  . Diverticulitis   . Tobacco abuse   . Obesity   . GERD (gastroesophageal reflux disease)   . Pneumonia 1/15    Past Surgical History  Procedure Laterality Date  . Hemocolectomy  2005    right  . Abdominal hysterectomy  1980  . Appendectomy    . Colonoscopy    . Esophagogastroduodenoscopy  06/16/2012    Procedure: ESOPHAGOGASTRODUODENOSCOPY (EGD);  Surgeon: Rogene Houston, MD;  [facility];  Service: Endoscopy;  Laterality: N/A;  10:30 AM  . Colonoscopy N/A 08/16/2014    Procedure: COLONOSCOPY;  Surgeon: Rogene Houston, MD;  [facility];  Service: Endoscopy;  Laterality: N/A;  1200-rescheduled to 9/4 @ 10:45 Ann notified pt  . Laparoscopic sigmoid colectomy N/A 01/07/2015    Procedure: LOW ANTERIOR COLON RESECTION;  Surgeon: Fanny Skates, MD;  Location: [facility];  Service:  General;  Laterality: N/A;    Family History  Problem Relation Age of Onset  . Healthy Mother   . Lung cancer Father     smoked  . Allergic Disorder Brother   . Healthy Daughter    Social History:  reports that she has been smoking Cigarettes.  She has a 20 pack-year smoking history. She has never used smokeless tobacco. She reports that she drinks alcohol. She reports that she does not use illicit drugs.  Allergies:  Allergies  Allergen Reactions  . Contrast Media [Iodinated Diagnostic Agents] Hives and Rash  . Penicillins Hives and Rash    *patient stated that she can takes some medication in the cillin family* Has patient had a PCN reaction causing immediate rash, facial/tongue/throat swelling, SOB or lightheadedness with hypotension: Yes Has patient had a PCN reaction causing severe rash involving mucus membranes or skin necrosis: No Has patient had a PCN reaction that required hospitalization No Has patient had a PCN reaction occurring within the last 10 years: No If all of    Medications Prior to Admission  Medication Sig Dispense Refill  . dicyclomine (BENTYL) 20 MG tablet Take 20 mg by mouth 2 (two) times daily.    Marland Kitchen HYDROcodone-acetaminophen (NORCO/VICODIN) 5-325 MG per tablet Take 1 tablet by mouth at bedtime as needed for moderate pain. Reported on 12/25/2015    . mesalamine (PENTASA) 500 MG CR capsule Take 1,000 mg by mouth 4 (four) times daily.     Marland Kitchen omeprazole (  PRILOSEC) 20 MG capsule Take 2 capsules (40 mg total) by mouth daily. 30 capsule 5  . potassium chloride (MICRO-K) 10 MEQ CR capsule Take 10 mEq by mouth daily.     . promethazine (PHENERGAN) 25 MG tablet Take 1 tablet (25 mg total) by mouth 2 (two) times daily as needed for nausea or vomiting. 20 tablet 0  . valsartan-hydrochlorothiazide (DIOVAN-HCT) 160-12.5 MG per tablet Take 0.5 tablets by mouth daily.     Marland Kitchen acetaminophen (TYLENOL) 500 MG tablet Take 1,000 mg by mouth every 6 (six) hours as needed for mild  pain.       Results for orders placed or performed during the hospital encounter of 01/19/16 (from the past 48 hour(s))  CBC     Status: Abnormal   Collection Time: 01/19/16 11:03 AM  Result Value Ref Range   WBC 9.1 4.0 - 10.5 K/uL   RBC 4.47 3.87 - 5.11 MIL/uL   Hemoglobin 16.1 (H) 12.0 - 15.0 g/dL   HCT 45.2 36.0 - 46.0 %   MCV 101.1 (H) 78.0 - 100.0 fL   MCH 36.0 (H) 26.0 - 34.0 pg   MCHC 35.6 30.0 - 36.0 g/dL   RDW 13.3 11.5 - 15.5 %   Platelets 228 150 - 400 K/uL  Basic metabolic panel     Status: None   Collection Time: 01/19/16 11:03 AM  Result Value Ref Range   Sodium 141 135 - 145 mmol/L   Potassium 3.5 3.5 - 5.1 mmol/L   Chloride 101 101 - 111 mmol/L   CO2 27 22 - 32 mmol/L   Glucose, Bld 94 65 - 99 mg/dL   BUN 14 6 - 20 mg/dL   Creatinine, Ser 0.70 0.44 - 1.00 mg/dL   Calcium 9.1 8.9 - 10.3 mg/dL   GFR calc non Af Amer >60 >60 mL/min   GFR calc Af Amer >60 >60 mL/min    Comment: (NOTE) The eGFR has been calculated using the CKD EPI equation. This calculation has not been validated in all clinical situations. eGFR's persistently <60 mL/min signify possible Chronic Kidney Disease.    Anion gap 13 5 - 15   No results found.  Review of Systems  Constitutional: Negative.   HENT: Negative.   Eyes: Negative.   Respiratory: Negative.   Cardiovascular: Negative.   Gastrointestinal: Negative.   Genitourinary: Negative.   Musculoskeletal: Positive for joint pain.  Skin: Negative.   Neurological: Negative.   Endo/Heme/Allergies: Negative.   Psychiatric/Behavioral: Negative.     Blood pressure 128/73, pulse 76, temperature 98.9 F (37.2 C), temperature source Oral, height 5' 7"  (1.702 m), weight 90.266 kg (199 lb), SpO2 97 %. Physical Exam  Constitutional: She appears well-developed.  HENT:  Head: Normocephalic.  Eyes: Pupils are equal, round, and reactive to light.  Neck: Normal range of motion.  Cardiovascular: Normal rate.   Respiratory: Effort normal.   Neurological: She is alert.  Skin: Skin is warm.  Psychiatric: She has a normal mood and affect.   examination of the right shoulder demonstrates some pain with passive range of motion but no restriction of external rotation 15 abduction. Her sensory function to the hand is intact radial pulses intact she has less than a centimeter sulcus sign. Negative apprehension relocation testing - anterior and posterior. She has good rotator cuff strength to isolated infraspinatus supraspinatus and subscapularis testing She has positive O'Brien's testing on the right negative on the left. No definite ac joint asymmetric tenderness on the right versus left.  Assessment/Plan Impression is right shoulder anterior inferior labral tearing with cyst possible superior labral tearing following injury at work plan arthroscopy with evaluation under anesthesia likely labral repair possible biceps tenodesis evaluation of the rotator cuff bursectomy. Risk benefits discussed with the patient including not limited to infection or vessel damage incomplete pain relief stiffness expected rehabilitative course also discussed with the patient All questions answered  Meredith Pel, MD 01/20/2016, 7:18 AM

## 2016-01-20 NOTE — Transfer of Care (Signed)
Immediate Anesthesia Transfer of Care Note  Patient: Elizabeth [name]  Procedure(s) Performed: Procedure(s): SHOULDER ARTHROSCOPY WITH LABRAL REPAIR (Right)  Patient Location: PACU  Anesthesia Type:GA combined with regional for post-op pain  Level of Consciousness: awake, alert , oriented and patient cooperative  Airway & Oxygen Therapy: Patient Spontanous Breathing and Patient connected to nasal cannula oxygen  Post-op Assessment: Report given to RN and Post -op Vital signs reviewed and stable  Post vital signs: Reviewed and stable  Last Vitals:  Filed Vitals:   01/20/16 0551  BP: 128/73  Pulse: 76  Temp: 60.6 C    Complications: No apparent anesthesia complications

## 2016-01-21 ENCOUNTER — Encounter (HOSPITAL_COMMUNITY): Payer: Self-pay | Admitting: Orthopedic Surgery

## 2016-01-21 NOTE — Anesthesia Postprocedure Evaluation (Signed)
Anesthesia Post Note  Patient: Elizabeth [name]  Procedure(s) Performed: Procedure(s) (LRB): SHOULDER ARTHROSCOPY WITH LABRAL REPAIR (Right)  Patient location during evaluation: PACU Anesthesia Type: Regional and General Level of consciousness: awake Pain management: pain level controlled Vital Signs Assessment: post-procedure vital signs reviewed and stable Respiratory status: spontaneous breathing and respiratory function stable Cardiovascular status: stable Postop Assessment: no signs of nausea or vomiting Anesthetic complications: no    Last Vitals:  Filed Vitals:   01/20/16 1115 01/20/16 1130  BP: 119/71 126/78  Pulse:  60  Temp:    Resp:      Last Pain:  Filed Vitals:   01/20/16 1147  PainSc: 0-No pain                 Tifini Reeder

## 2016-01-21 NOTE — Op Note (Signed)
Elizabeth [name], Elizabeth [name] NO.:  [id number]  MEDICAL RECORD NO.:  [id number]  LOCATION:                                 FACILITY:  PHYSICIAN:  Anderson Malta, M.D.    DATE OF BIRTH:  Jun 09, 1961  DATE OF PROCEDURE: DATE OF DISCHARGE:                              OPERATIVE REPORT   PREOPERATIVE DIAGNOSIS:  Right shoulder labral tear and associated cyst.  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  PROCEDURE:  Right shoulder arthroscopy, debridement of intra-articular debris along with debridement of the partial thickness rotator cuff tear, debridement and evacuation of paralabral cyst with arthroscopic anterior-inferior labral repair.  SURGEON:  Anderson Malta, MD.  ASSISTANT:  Laure Kidney, RNFA  INDICATIONS:  Elizabeth [name] is a patient with right shoulder pain following an injury 3 weeks ago.  She is approximately 2 months out from her injury and has failed conservative management measures.  OPERATIVE FINDINGS: 1. Examination under anesthesia, range of motion, full forward     flexion, external rotation 15 degrees and abduction is about 50-60     degrees, isolated glenohumeral abduction is about 110 on the right.     Shoulder stability 1+ anterior, 1+ posterior with less than a cm     sulcus sign. 2. Diagnostic arthroscopy:. 3. A.  Stable biceps anchor. 4. B.  Early inflammation within the rotator interval. 5. C.  Intact biceps tendon. 6. D.  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  PROCEDURE IN DETAIL:  The  patient was brought to the operating room where general anesthetic was induced.  Preoperative antibiotics administered.  Time-out was called.  Right arm was examined under anesthesia.  The patient was placed in lateral position with the arm suspended in about 45 degrees of abduction and 10 degrees of forward flexion under about 15 pounds of traction.  Right arm and shoulder prescrubbed with alcohol and Betadine allowed to air dry, prepped with DuraPrep solution.  Charlie Pitter was used to cover the axilla.  Posterior portal created about a cm medial and inferior to the posterolateral margin of the acromion.  Diagnostic arthroscopy was performed.  The patient had significant chondral debris within the glenohumeral joint. Glenohumeral articular surface arthritis was present particularly on the glenoid side as well as the humeral head side.  The humeral head side articular damage was more uniform, grade 3.  There was some grade 4 changes over about 30% of the glenoid articular surface mid equator. The biceps anchor was stable.  Posterior labrum was intact.  Rotator cuff had partial thickness tearing supraspinatus at its attachment which was debrided.  This was about a 15% thickness tear.  Following debridement of the rotator cuff tear through  an anterior portal which was created under direct visualization, the rasp was then used to develop the plane in the anterior inferior labrum.  Degenerative tearing was present.  Cyst was decompressed and efflux of cyst contents was noted at the interface between the glenoid and the anterior inferior labrum following thorough decompression and rasping with both the meniscal rasp and a shaver.  The labrum was repaired with anchors placed at the 3, 4, and 5 o'clock position.  Arthrex PushLocks were utilized. Secured labral repair was achieved.  At this time, thorough irrigation was performed.  It should be noted that an accessory anterior portal was created to  facilitate placement of the sutures and then the suture anchors.  At this time, thorough irrigation was performed and the joint instruments were removed.  Portals were closed using 2-0 Vicryl, 3-0 nylon.  The patient was placed in a sling, tolerated procedure well without immediate complications, transferred to recovery room in stable condition.     Anderson Malta, M.D.     GSD/MEDQ  D:  01/20/2016  T:  01/20/2016  Job:  [id number]

## 2016-02-19 ENCOUNTER — Ambulatory Visit: Payer: PRIVATE HEALTH INSURANCE | Attending: Orthopedic Surgery | Admitting: Physical Therapy

## 2016-02-19 DIAGNOSIS — M25511 Pain in right shoulder: Secondary | ICD-10-CM | POA: Insufficient documentation

## 2016-02-19 DIAGNOSIS — M25611 Stiffness of right shoulder, not elsewhere classified: Secondary | ICD-10-CM

## 2016-02-19 DIAGNOSIS — R531 Weakness: Secondary | ICD-10-CM | POA: Insufficient documentation

## 2016-02-19 DIAGNOSIS — R5381 Other malaise: Secondary | ICD-10-CM | POA: Insufficient documentation

## 2016-02-19 NOTE — Therapy (Signed)
[facility] [place], [place], [address] Phone: [phone]   Fax:  [phone]  Physical Therapy Evaluation  Patient Details  Name: Elizabeth [name] MRN: [id number] Date of Birth: Sep 27, 1961 Referring Provider: Meredith Pel MD.  Encounter Date: 02/19/2016      PT End of Session - 02/19/16 1239    Visit Number 1   Number of Visits 12   PT Start Time 1120   PT Stop Time 1207   PT Time Calculation (min) 47 min   Activity Tolerance Patient tolerated treatment well   Behavior During Therapy [facility] for tasks assessed/performed      Past Medical History  Diagnosis Date  . Crohn's disease ([place])   . Elevated transaminase level   . Hypertension   . Bronchitis   . Cough 01/28/2014    upper airway cough syndrome  . Diverticulitis   . Tobacco abuse   . Obesity   . GERD (gastroesophageal reflux disease)   . Pneumonia 1/15    Past Surgical History  Procedure Laterality Date  . Hemocolectomy  2005    right  . Abdominal hysterectomy  1980  . Appendectomy    . Colonoscopy    . Esophagogastroduodenoscopy  06/16/2012    Procedure: ESOPHAGOGASTRODUODENOSCOPY (EGD);  Surgeon: Rogene Houston, MD;  [facility];  Service: Endoscopy;  Laterality: N/A;  10:30 AM  . Colonoscopy N/A 08/16/2014    Procedure: COLONOSCOPY;  Surgeon: Rogene Houston, MD;  [facility];  Service: Endoscopy;  Laterality: N/A;  1200-rescheduled to 9/4 @ 10:45 Ann notified pt  . Laparoscopic sigmoid colectomy N/A 01/07/2015    Procedure: LOW ANTERIOR COLON RESECTION;  Surgeon: Fanny Skates, MD;  Location: [place];  Service: General;  Laterality: N/A;  . Shoulder arthroscopy with labral repair Right 01/20/2016    Procedure: SHOULDER ARTHROSCOPY WITH LABRAL REPAIR;  Surgeon: Meredith Pel, MD;  Location: [place];  Service: Orthopedics;  Laterality: Right;    There were no vitals filed for this visit.  Visit Diagnosis:  Right shoulder pain  - Plan: PT plan of care cert/re-cert  Decreased range of motion of right shoulder - Plan: PT plan of care cert/re-cert      Subjective Assessment - 02/19/16 1236    Subjective I'm using my CPM at home.   Patient Stated Goals Get back to work.   Pain Score 4    Pain Location Shoulder   Pain Orientation Right   Pain Descriptors / Indicators Aching;Burning   Pain Type Surgical pain            [facility] PT Assessment - 02/19/16 0001    Assessment   Medical Diagnosis Right shoulder pain.   Referring Provider Meredith Pel MD.   Onset Date/Surgical Date --  01/20/16 (surgery date).   Hand Dominance Right   Next MD Visit --  03/03/16.   Precautions   Precaution Comments MD order:  PROM; AAROM.   Restrictions   Weight Bearing Restrictions No   Balance Screen   Has the patient fallen in the past 6 months No   Has the patient had a decrease in activity level because of a fear of falling?  No   Is the patient reluctant to leave their home because of a fear of falling?  No   Prior Function   Level of Independence Independent   Observation/Other Assessments   Observations left shoulder incisional sites are healed.   Posture/Postural Control  Posture/Postural Control Postural limitations   Postural Limitations Rounded Shoulders;Forward head   ROM / Strength   AROM / PROM / Strength PROM   PROM   Overall PROM Comments Left shoulder flexion= 99 degrees and ER= 20 degrees.   Strength   Overall Strength Comments NT.   Palpation   Palpation comment Tender to palpation over right anterior shoulder.   Ambulation/Gait   Gait Comments WNL.                   [facility] Adult PT Treatment/Exercise - 02/19/16 0001    Modalities   Modalities Cryotherapy   Cryotherapy   Number Minutes Cryotherapy 20 Minutes   Cryotherapy Location --  Right shoulder.   Acupuncturist Location --  Right shoulder.   Electrical Stimulation Action Constant  Pre-mod e'stim (output 5).   Electrical Stimulation Parameters 80-150 HZ.   Electrical Stimulation Goals Pain                  PT Short Term Goals - 02/19/16 1243    PT SHORT TERM GOAL #1   Title I with inital HEP   Time 2   Period Weeks   Status New   PT SHORT TERM GOAL #2   Title decrease pain in R shoulder by 25% with ADLS.   Time 2   Period Weeks   Status New           PT Long Term Goals - 02/19/16 1243    PT LONG TERM GOAL #1   Title Able to perform ADLs including work requirements with R shoulder pain 2/10 or less.   Time 6   Period Weeks   Status New   PT LONG TERM GOAL #2   Title Demo active R shoulder flex to 165 deg to improve function.   Time 6   Period Weeks   Status New   PT LONG TERM GOAL #3   Title Demo 5/5 R shoulder strength   Time 6   Period Weeks   Status New               Plan - 02/19/16 1239    Clinical Impression Statement The patient injured herself at work on 11/13/16.  She sustained a right shoulder injury that did not improve.  She underwent a right shoulder surgery on 01/20/16 that involved a SLAP repair.  She has been using a CPM at home.  Her resting pain-level is a 4/10 but can rise to higher levels 4-0/34 with certain movements.   Pt will benefit from skilled therapeutic intervention in order to improve on the following deficits Pain;Decreased range of motion;Decreased activity tolerance   Rehab Potential Excellent   PT Frequency 3x / week   PT Duration 6 weeks   PT Treatment/Interventions ADLs/Self Care Home Management;Electrical Stimulation;Cryotherapy;Moist Heat;Therapeutic exercise;Therapeutic activities;Ultrasound;Neuromuscular re-education;Patient/family education;Manual techniques   PT Next Visit Plan Please refer to scanned protocol for guidance.  Current MD orders:  PROM; AAROM.   Consulted and Agree with Plan of Care Patient         Problem List Patient Active Problem List   Diagnosis Date Noted  .  Diverticulitis large intestine 01/07/2015  . Diverticulitis 07/03/2014  . Hypokalemia 07/03/2014  . Tobacco abuse   . Obesity   . Upper airway cough syndrome 01/28/2014  . IBS (irritable bowel syndrome) 07/10/2013  . Acute bilateral lower abdominal pain 03/26/2013  . Tiredness 03/26/2013  . Sigmoid diverticulitis 09/12/2012  .  Epigastric pain 05/22/2012  . Nausea and vomiting 01/28/2012  . Crohn's disease of ileum ([place]) 01/10/2012  . GERD (gastroesophageal reflux disease) 01/10/2012  . HTN (hypertension) 01/10/2012    Michelle Vanhise, [place] MPT 02/19/2016, 12:52 PM  [facility] [place], [place], [address] Phone: [phone]   Fax:  [phone]  Name: Elizabeth [name] MRN: [id number] Date of Birth: 1961/02/13

## 2016-02-23 ENCOUNTER — Encounter: Payer: PRIVATE HEALTH INSURANCE | Admitting: Physical Therapy

## 2016-02-25 ENCOUNTER — Ambulatory Visit: Payer: PRIVATE HEALTH INSURANCE | Admitting: Physical Therapy

## 2016-02-25 DIAGNOSIS — M25611 Stiffness of right shoulder, not elsewhere classified: Secondary | ICD-10-CM

## 2016-02-25 DIAGNOSIS — M25511 Pain in right shoulder: Secondary | ICD-10-CM

## 2016-02-25 NOTE — Therapy (Signed)
[facility] [place], [place], [address] Phone: [phone]   Fax:  [phone]  Physical Therapy Treatment  Patient Details  Name: Elizabeth [name] MRN: [id number] Date of Birth: 1961-12-02 Referring Provider: Meredith Pel MD.  Encounter Date: 02/25/2016      PT End of Session - 02/25/16 1217    Visit Number 2   Number of Visits 12   Date for PT Re-Evaluation 02/05/16   PT Start Time 9030   PT Stop Time 1214   PT Time Calculation (min) 51 min   Activity Tolerance Patient tolerated treatment well   Behavior During Therapy [facility] for tasks assessed/performed      Past Medical History  Diagnosis Date  . Crohn's disease ([place])   . Elevated transaminase level   . Hypertension   . Bronchitis   . Cough 01/28/2014    upper airway cough syndrome  . Diverticulitis   . Tobacco abuse   . Obesity   . GERD (gastroesophageal reflux disease)   . Pneumonia 1/15    Past Surgical History  Procedure Laterality Date  . Hemocolectomy  2005    right  . Abdominal hysterectomy  1980  . Appendectomy    . Colonoscopy    . Esophagogastroduodenoscopy  06/16/2012    Procedure: ESOPHAGOGASTRODUODENOSCOPY (EGD);  Surgeon: Rogene Houston, MD;  [facility];  Service: Endoscopy;  Laterality: N/A;  10:30 AM  . Colonoscopy N/A 08/16/2014    Procedure: COLONOSCOPY;  Surgeon: Rogene Houston, MD;  [facility];  Service: Endoscopy;  Laterality: N/A;  1200-rescheduled to 9/4 @ 10:45 Ann notified pt  . Laparoscopic sigmoid colectomy N/A 01/07/2015    Procedure: LOW ANTERIOR COLON RESECTION;  Surgeon: Fanny Skates, MD;  Location: [place];  Service: General;  Laterality: N/A;  . Shoulder arthroscopy with labral repair Right 01/20/2016    Procedure: SHOULDER ARTHROSCOPY WITH LABRAL REPAIR;  Surgeon: Meredith Pel, MD;  Location: [place];  Service: Orthopedics;  Laterality: Right;    There were no vitals filed for this  visit.  Visit Diagnosis:  Decreased range of motion of right shoulder  Right shoulder pain  Pain in joint of right shoulder      Subjective Assessment - 02/25/16 1157    Subjective I got a stitch coming out.   Pain Score 4    Pain Location Shoulder   Pain Orientation Right   Pain Descriptors / Indicators Aching;Burning   Pain Type Surgical pain   Pain Onset More than a month ago                         [facility] Adult PT Treatment/Exercise - 02/25/16 0001    Electrical Stimulation   Electrical Stimulation Action Constant Pre-mod e'stim   Electrical Stimulation Parameters 80-150 HZ x 16 minutes.   Electrical Stimulation Goals Pain   Manual Therapy   Manual therapy comments In supine right shoulder flexion and ER per protocol guidelines gentle P and AAROM x 23 minutes.                  PT Short Term Goals - 02/19/16 1243    PT SHORT TERM GOAL #1   Title I with inital HEP   Time 2   Period Weeks   Status New   PT SHORT TERM GOAL #2   Title decrease pain in R shoulder by 25% with ADLS.   Time 2  Period Weeks   Status New           PT Long Term Goals - 02/19/16 1243    PT LONG TERM GOAL #1   Title Able to perform ADLs including work requirements with R shoulder pain 2/10 or less.   Time 6   Period Weeks   Status New   PT LONG TERM GOAL #2   Title Demo active R shoulder flex to 165 deg to improve function.   Time 6   Period Weeks   Status New   PT LONG TERM GOAL #3   Title Demo 5/5 R shoulder strength   Time 6   Period Weeks   Status New               Problem List Patient Active Problem List   Diagnosis Date Noted  . Diverticulitis large intestine 01/07/2015  . Diverticulitis 07/03/2014  . Hypokalemia 07/03/2014  . Tobacco abuse   . Obesity   . Upper airway cough syndrome 01/28/2014  . IBS (irritable bowel syndrome) 07/10/2013  . Acute bilateral lower abdominal pain 03/26/2013  . Tiredness 03/26/2013  . Sigmoid  diverticulitis 09/12/2012  . Epigastric pain 05/22/2012  . Nausea and vomiting 01/28/2012  . Crohn's disease of ileum ([place]) 01/10/2012  . GERD (gastroesophageal reflux disease) 01/10/2012  . HTN (hypertension) 01/10/2012    Virgie Kunda, [place] MPT 02/25/2016, 12:20 PM  [facility] [address] [place], [place], [address] Phone: [phone]   Fax:  [phone]  Name: BRENDALIZ KUK MRN: [id number] Date of Birth: 1961-06-15

## 2016-02-27 ENCOUNTER — Ambulatory Visit: Payer: PRIVATE HEALTH INSURANCE | Admitting: Physical Therapy

## 2016-02-27 ENCOUNTER — Encounter: Payer: Self-pay | Admitting: Physical Therapy

## 2016-02-27 DIAGNOSIS — M25511 Pain in right shoulder: Secondary | ICD-10-CM

## 2016-02-27 DIAGNOSIS — M25611 Stiffness of right shoulder, not elsewhere classified: Secondary | ICD-10-CM

## 2016-02-27 NOTE — Therapy (Signed)
[facility] [place], [place], [address] Phone: [phone]   Fax:  [phone]  Physical Therapy Treatment  Patient Details  Name: Elizabeth [name] MRN: [id number] Date of Birth: [birth year] Referring Provider: Meredith Pel MD.  Encounter Date: 02/27/2016      PT End of Session - 02/27/16 1028    Visit Number 3   Number of Visits 12   Date for PT Re-Evaluation 02/05/16   PT Start Time 1031   PT Stop Time 1114   PT Time Calculation (min) 43 min   Activity Tolerance Patient tolerated treatment well   Behavior During Therapy [facility] for tasks assessed/performed      Past Medical History  Diagnosis Date  . Crohn's disease ([place])   . Elevated transaminase level   . Hypertension   . Bronchitis   . Cough 01/28/2014    upper airway cough syndrome  . Diverticulitis   . Tobacco abuse   . Obesity   . GERD (gastroesophageal reflux disease)   . Pneumonia 1/15    Past Surgical History  Procedure Laterality Date  . Hemocolectomy  2005    right  . Abdominal hysterectomy  1980  . Appendectomy    . Colonoscopy    . Esophagogastroduodenoscopy  06/16/2012    Procedure: ESOPHAGOGASTRODUODENOSCOPY (EGD);  Surgeon: Rogene Houston, MD;  [facility];  Service: Endoscopy;  Laterality: N/A;  10:30 AM  . Colonoscopy N/A 08/16/2014    Procedure: COLONOSCOPY;  Surgeon: Rogene Houston, MD;  [facility];  Service: Endoscopy;  Laterality: N/A;  1200-rescheduled to 9/4 @ 10:45 Ann notified pt  . Laparoscopic sigmoid colectomy N/A 01/07/2015    Procedure: LOW ANTERIOR COLON RESECTION;  Surgeon: Fanny Skates, MD;  Location: [place];  Service: General;  Laterality: N/A;  . Shoulder arthroscopy with labral repair Right 01/20/2016    Procedure: SHOULDER ARTHROSCOPY WITH LABRAL REPAIR;  Surgeon: Meredith Pel, MD;  Location: [place];  Service: Orthopedics;  Laterality: Right;    There were no vitals filed for this  visit.  Visit Diagnosis:  Decreased range of motion of right shoulder  Right shoulder pain      Subjective Assessment - 02/27/16 1030    Subjective States that R shoulder is sore today. States that if she has pain she uses CPM.   Pertinent History HTN, Crohns Disease   Diagnostic tests mri, xray; results: mod RC tendinopathy/tendinosis, shallow tears of supraspinatus (no full thickness tears); ant/inf labral tear; mod GH degenerative changes   Patient Stated Goals Get back to work.   Currently in Pain? Yes   Pain Location Shoulder   Pain Orientation Right   Pain Descriptors / Indicators Sore   Pain Type Surgical pain   Pain Onset More than a month ago                         [facility] Adult PT Treatment/Exercise - 02/27/16 0001    Modalities   Modalities Electrical Stimulation   Electrical Stimulation   Electrical Stimulation Location R shoulder   Electrical Stimulation Action Pre-Mod   Electrical Stimulation Parameters 80-150 Hz x15 min, output 2   Electrical Stimulation Goals Pain   Manual Therapy   Manual Therapy Passive ROM   Passive ROM PROM of R shoulder into flex/ER/IR with gentle holds at end range  PT Short Term Goals - 02/19/16 1243    PT SHORT TERM GOAL #1   Title I with inital HEP   Time 2   Period Weeks   Status New   PT SHORT TERM GOAL #2   Title decrease pain in R shoulder by 25% with ADLS.   Time 2   Period Weeks   Status New           PT Long Term Goals - 02/19/16 1243    PT LONG TERM GOAL #1   Title Able to perform ADLs including work requirements with R shoulder pain 2/10 or less.   Time 6   Period Weeks   Status New   PT LONG TERM GOAL #2   Title Demo active R shoulder flex to 165 deg to improve function.   Time 6   Period Weeks   Status New   PT LONG TERM GOAL #3   Title Demo 5/5 R shoulder strength   Time 6   Period Weeks   Status New               Plan - 02/27/16 1102     Clinical Impression Statement Patient presented in clinic with increased R shoulder soreness upon arrival. Patient arrived in clinic and had no sling donned with which patient states she does not have to wear at this time. Patient demonstrated firm end feels during today's PROM of R shoulder with gentle holds at end range. Patient presented with an area around an incision which she stated was a stitch but may have been a scab. Normal stimulation response noted following removal of the modalities. Patient was only able to tolerate 2 output on electrical stimulation today. Goals remain on-going at this time secondary to protocol limitations for ROM and strengthening. Patient experienced R shoulder achiness following today's treatment.   Pt will benefit from skilled therapeutic intervention in order to improve on the following deficits Pain;Decreased range of motion;Decreased activity tolerance   Rehab Potential Excellent   PT Frequency 3x / week   PT Duration 6 weeks   PT Treatment/Interventions ADLs/Self Care Home Management;Electrical Stimulation;Cryotherapy;Moist Heat;Therapeutic exercise;Therapeutic activities;Ultrasound;Neuromuscular re-education;Patient/family education;Manual techniques   PT Next Visit Plan Please refer to scanned protocol for guidance.  Current MD orders:  PROM; AAROM.   PT Home Exercise Plan shoulder isometrics   Consulted and Agree with Plan of Care Patient        Problem List Patient Active Problem List   Diagnosis Date Noted  . Diverticulitis large intestine 01/07/2015  . Diverticulitis 07/03/2014  . Hypokalemia 07/03/2014  . Tobacco abuse   . Obesity   . Upper airway cough syndrome 01/28/2014  . IBS (irritable bowel syndrome) 07/10/2013  . Acute bilateral lower abdominal pain 03/26/2013  . Tiredness 03/26/2013  . Sigmoid diverticulitis 09/12/2012  . Epigastric pain 05/22/2012  . Nausea and vomiting 01/28/2012  . Crohn's disease of ileum ([place]) 01/10/2012  .  GERD (gastroesophageal reflux disease) 01/10/2012  . HTN (hypertension) 01/10/2012    Wynelle Fanny, PTA 02/27/2016, 11:23 AM  [facility] [address] [place], [place], [address] Phone: [phone]   Fax:  [phone]  Name: AMELDA HAPKE MRN: [id number] Date of Birth: [birth year]

## 2016-03-01 ENCOUNTER — Encounter: Payer: Self-pay | Admitting: Physical Therapy

## 2016-03-01 ENCOUNTER — Ambulatory Visit: Payer: PRIVATE HEALTH INSURANCE | Admitting: Physical Therapy

## 2016-03-01 DIAGNOSIS — R531 Weakness: Secondary | ICD-10-CM

## 2016-03-01 DIAGNOSIS — M25611 Stiffness of right shoulder, not elsewhere classified: Secondary | ICD-10-CM

## 2016-03-01 DIAGNOSIS — R5381 Other malaise: Secondary | ICD-10-CM

## 2016-03-01 DIAGNOSIS — M25511 Pain in right shoulder: Secondary | ICD-10-CM

## 2016-03-01 NOTE — Therapy (Signed)
[facility] [place], [place], [address] Phone: [phone]   Fax:  [phone]  Physical Therapy Treatment  Patient Details  Name: Elizabeth [name] MRN: [id number] Date of Birth: 1961-01-29 Referring Provider: Meredith Pel MD.  Encounter Date: 03/01/2016      PT End of Session - 03/01/16 1113    Visit Number 4   Number of Visits 12   Date for PT Re-Evaluation 03/04/16   PT Start Time 1115   PT Stop Time 1158   PT Time Calculation (min) 43 min   Activity Tolerance Patient tolerated treatment well;Patient limited by pain   Behavior During Therapy [facility] for tasks assessed/performed      Past Medical History  Diagnosis Date  . Crohn's disease ([place])   . Elevated transaminase level   . Hypertension   . Bronchitis   . Cough 01/28/2014    upper airway cough syndrome  . Diverticulitis   . Tobacco abuse   . Obesity   . GERD (gastroesophageal reflux disease)   . Pneumonia 1/15    Past Surgical History  Procedure Laterality Date  . Hemocolectomy  2005    right  . Abdominal hysterectomy  1980  . Appendectomy    . Colonoscopy    . Esophagogastroduodenoscopy  06/16/2012    Procedure: ESOPHAGOGASTRODUODENOSCOPY (EGD);  Surgeon: Rogene Houston, MD;  [facility];  Service: Endoscopy;  Laterality: N/A;  10:30 AM  . Colonoscopy N/A 08/16/2014    Procedure: COLONOSCOPY;  Surgeon: Rogene Houston, MD;  [facility];  Service: Endoscopy;  Laterality: N/A;  1200-rescheduled to 9/4 @ 10:45 Ann notified pt  . Laparoscopic sigmoid colectomy N/A 01/07/2015    Procedure: LOW ANTERIOR COLON RESECTION;  Surgeon: Fanny Skates, MD;  Location: [place];  Service: General;  Laterality: N/A;  . Shoulder arthroscopy with labral repair Right 01/20/2016    Procedure: SHOULDER ARTHROSCOPY WITH LABRAL REPAIR;  Surgeon: Meredith Pel, MD;  Location: [place];  Service: Orthopedics;  Laterality: Right;    There were no vitals  filed for this visit.  Visit Diagnosis:  Decreased range of motion of right shoulder  Right shoulder pain  Pain in joint of right shoulder  Stiffness of right shoulder joint  Debility  Weakness      Subjective Assessment - 03/01/16 1116    Subjective States that R shoulder hurts a lot today and reported growing frustrated due to inability to complete tasks such as her hair. States that she is sleeping a lot and does not want to see people. Asked if she could sweep floors.   Diagnostic tests mri, xray; results: mod RC tendinopathy/tendinosis, shallow tears of supraspinatus (no full thickness tears); ant/inf labral tear; mod GH degenerative changes   Patient Stated Goals Get back to work.   Currently in Pain? Yes   Pain Score 6    Pain Location Shoulder   Pain Orientation Right   Pain Type Surgical pain   Pain Onset More than a month ago            [facility] PT Assessment - 03/01/16 0001    Assessment   Medical Diagnosis Right shoulder pain.   Onset Date/Surgical Date 01/20/16   Hand Dominance Right   Next MD Visit 03/03/2016   Precautions   Precaution Comments MD order:  PROM; AAROM.   Restrictions   Weight Bearing Restrictions No  [place] Adult PT Treatment/Exercise - 03/01/16 0001    Modalities   Modalities Electrical Stimulation   Electrical Stimulation   Electrical Stimulation Location R shoulder   Electrical Stimulation Action Pre-Mod   Electrical Stimulation Parameters 80-150 Hz x15 min, output 5   Electrical Stimulation Goals Pain   Manual Therapy   Manual Therapy Passive ROM   Passive ROM PROM of R shoulder into flex/ER/IR with gentle holds at end range with frequent oscillations to promote relaxation                  PT Short Term Goals - 02/19/16 1243    PT SHORT TERM GOAL #1   Title I with inital HEP   Time 2   Period Weeks   Status New   PT SHORT TERM GOAL #2   Title decrease pain in R shoulder by 25% with  ADLS.   Time 2   Period Weeks   Status New           PT Long Term Goals - 02/19/16 1243    PT LONG TERM GOAL #1   Title Able to perform ADLs including work requirements with R shoulder pain 2/10 or less.   Time 6   Period Weeks   Status New   PT LONG TERM GOAL #2   Title Demo active R shoulder flex to 165 deg to improve function.   Time 6   Period Weeks   Status New   PT LONG TERM GOAL #3   Title Demo 5/5 R shoulder strength   Time 6   Period Weeks   Status New               Plan - 03/01/16 1154    Clinical Impression Statement Patient presented in clinic today with increased R shoulder soreness and also with frustration regarding the healing and inability to complete tasks at home. Patient continues to be limited especially R shoulder ER today with back arching noted with initial repititions followed by oscilations and VCs for relaxation. Firm end feels noted with R shoulder PROM into flexion/ER/IR with gentle holds at end range today. Intermittant catching sensation with descention into neutral position of R shoulder from flexion. Frequent oscillations were utilized to promote relaxation of R shoulder. Patient was educated that rehabilitation following a surgery such as she one she had requires time and patience to allow tissues to heal properly. Patient was educated to avoid sweeping floors until she recieves clearance from Psychologist, sport and exercise. Normal modalities response noted following removal of the modaliites. Patient verablized that her R shoulder felt "okay" following today's treatment.   Pt will benefit from skilled therapeutic intervention in order to improve on the following deficits Pain;Decreased range of motion;Decreased activity tolerance   Rehab Potential Excellent   PT Frequency 3x / week   PT Duration 6 weeks   PT Treatment/Interventions ADLs/Self Care Home Management;Electrical Stimulation;Cryotherapy;Moist Heat;Therapeutic exercise;Therapeutic  activities;Ultrasound;Neuromuscular re-education;Patient/family education;Manual techniques   PT Next Visit Plan Please refer to scanned protocol for guidance.  Current MD orders:  PROM; AAROM. MD note required next treatment as patient returns to MD 03/03/2016.   PT Home Exercise Plan shoulder isometrics   Consulted and Agree with Plan of Care Patient        Problem List Patient Active Problem List   Diagnosis Date Noted  . Diverticulitis large intestine 01/07/2015  . Diverticulitis 07/03/2014  . Hypokalemia 07/03/2014  . Tobacco abuse   . Obesity   . Upper airway cough  syndrome 01/28/2014  . IBS (irritable bowel syndrome) 07/10/2013  . Acute bilateral lower abdominal pain 03/26/2013  . Tiredness 03/26/2013  . Sigmoid diverticulitis 09/12/2012  . Epigastric pain 05/22/2012  . Nausea and vomiting 01/28/2012  . Crohn's disease of ileum ([place]) 01/10/2012  . GERD (gastroesophageal reflux disease) 01/10/2012  . HTN (hypertension) 01/10/2012    Wynelle Fanny, PTA 03/01/2016, 12:05 PM  [facility] [address] [place], [place], [address] Phone: [phone]   Fax:  [phone]  Name: ELGIE LANDINO MRN: [id number] Date of Birth: 05/12/1961

## 2016-03-02 ENCOUNTER — Encounter: Payer: PRIVATE HEALTH INSURANCE | Admitting: Physical Therapy

## 2016-03-04 ENCOUNTER — Ambulatory Visit: Payer: PRIVATE HEALTH INSURANCE | Admitting: Physical Therapy

## 2016-03-04 DIAGNOSIS — M25611 Stiffness of right shoulder, not elsewhere classified: Secondary | ICD-10-CM

## 2016-03-04 DIAGNOSIS — R5381 Other malaise: Secondary | ICD-10-CM

## 2016-03-04 DIAGNOSIS — M25511 Pain in right shoulder: Secondary | ICD-10-CM

## 2016-03-04 NOTE — Therapy (Signed)
[facility] [place], [place], [address] Phone: [phone]   Fax:  [phone]  Physical Therapy Treatment  Patient Details  Name: Elizabeth [name] MRN: [id number] Date of Birth: August 13, 1961 Referring Provider: Meredith Pel MD.  Encounter Date: 03/04/2016      PT End of Session - 03/04/16 1119    Visit Number 5   Number of Visits 17   Date for PT Re-Evaluation 04/01/16   PT Start Time 1117   PT Stop Time 1222   PT Time Calculation (min) 65 min   Activity Tolerance Patient tolerated treatment well   Behavior During Therapy [facility] for tasks assessed/performed      Past Medical History  Diagnosis Date  . Crohn's disease ([place])   . Elevated transaminase level   . Hypertension   . Bronchitis   . Cough 01/28/2014    upper airway cough syndrome  . Diverticulitis   . Tobacco abuse   . Obesity   . GERD (gastroesophageal reflux disease)   . Pneumonia 1/15    Past Surgical History  Procedure Laterality Date  . Hemocolectomy  2005    right  . Abdominal hysterectomy  1980  . Appendectomy    . Colonoscopy    . Esophagogastroduodenoscopy  06/16/2012    Procedure: ESOPHAGOGASTRODUODENOSCOPY (EGD);  Surgeon: Rogene Houston, MD;  [facility];  Service: Endoscopy;  Laterality: N/A;  10:30 AM  . Colonoscopy N/A 08/16/2014    Procedure: COLONOSCOPY;  Surgeon: Rogene Houston, MD;  [facility];  Service: Endoscopy;  Laterality: N/A;  1200-rescheduled to 9/4 @ 10:45 Ann notified pt  . Laparoscopic sigmoid colectomy N/A 01/07/2015    Procedure: LOW ANTERIOR COLON RESECTION;  Surgeon: Fanny Skates, MD;  Location: [place];  Service: General;  Laterality: N/A;  . Shoulder arthroscopy with labral repair Right 01/20/2016    Procedure: SHOULDER ARTHROSCOPY WITH LABRAL REPAIR;  Surgeon: Meredith Pel, MD;  Location: [place];  Service: Orthopedics;  Laterality: Right;    There were no vitals filed for this  visit.  Visit Diagnosis:  Decreased range of motion of right shoulder - Plan: PT plan of care cert/re-cert  Pain in joint of right shoulder - Plan: PT plan of care cert/re-cert  Debility - Plan: PT plan of care cert/re-cert      Subjective Assessment - 03/04/16 1121    Subjective Patient saw MD 03/03/16 and presents with new Rx for ROM and strengthening.   Pertinent History HTN, Crohns Disease   Diagnostic tests mri, xray; results: mod RC tendinopathy/tendinosis, shallow tears of supraspinatus (no full thickness tears); ant/inf labral tear; mod GH degenerative changes   Patient Stated Goals Get back to work.   Currently in Pain? Yes   Pain Score --  0.5   Pain Location Shoulder   Pain Orientation Right   Pain Descriptors / Indicators Sore   Pain Type Surgical pain            [facility] PT Assessment - 03/04/16 0001    Assessment   Medical Diagnosis Right shoulder pain.   Next MD Visit 03/31/16   ROM / Strength   AROM / PROM / Strength AROM;PROM   AROM   AROM Assessment Site Shoulder   Right Shoulder Extension --   Right Shoulder Flexion 135 Degrees   Right Shoulder ABduction 90 Degrees   Right Shoulder Internal Rotation 76 Degrees   Right Shoulder External Rotation 48 Degrees  supine at 90  deg   PROM   Right/Left Shoulder Right   Right Shoulder Flexion 140 Degrees   Right Shoulder ABduction 105 Degrees   Right Shoulder External Rotation 57 Degrees  supine at 90 deg                     [facility] Adult PT Treatment/Exercise - 03/04/16 0001    Shoulder Exercises: Supine   External Rotation Strengthening;Right  30 reps   Theraband Level (Shoulder External Rotation) Level 1 (Yellow)   Flexion AAROM;Both;10 reps  with cane   Other Supine Exercises PNF x 10 D2 flex/ext   Other Supine Exercises ER with cane x 10   Shoulder Exercises: Seated   Abduction Strengthening;Right;20 reps   Shoulder Exercises: Prone   Horizontal ABduction 1 Strengthening;Right;20 reps    Other Prone Exercises Rows x 20   Shoulder Exercises: Pulleys   Flexion Other (comment)  5 min   Other Pulley Exercises UE ranger flex x 10; seated ER x 4 min   Modalities   Modalities Electrical Stimulation   Electrical Stimulation   Electrical Stimulation Location R shoulder   Electrical Stimulation Action premod   Electrical Stimulation Parameters 80-150 hz to tolerance x 15 min   Electrical Stimulation Goals Pain   Manual Therapy   Manual Therapy Passive ROM;Soft tissue mobilization;Scapular mobilization   Soft tissue mobilization to subscapularis and teres muscles along scapular border   Scapular Mobilization in [facility] all planes   Passive ROM to R shoulder flex, IR/ER at 0, 50 and 90 deg ABD                  PT Short Term Goals - 03/04/16 1232    PT SHORT TERM GOAL #1   Title I with inital HEP   Time 2   Period Weeks   Status Achieved   PT SHORT TERM GOAL #2   Title decrease pain in R shoulder by 25% with ADLS.   Time 2   Period Weeks   Status On-going           PT Long Term Goals - 03/04/16 1233    PT LONG TERM GOAL #1   Title Able to perform ADLs including work requirements with R shoulder pain 2/10 or less.   Time 4   Period Weeks   Status On-going   PT LONG TERM GOAL #2   Title Demo active R shoulder flex to 165 deg to improve function.   Time 4   Period Weeks   Status On-going   PT LONG TERM GOAL #3   Title Demo 5/5 R shoulder strength   Time 4   Period Weeks   Status On-going               Plan - 03/04/16 1224    Clinical Impression Statement Patient presents today with new RX for strengthening and ROM, 3x/wk for 4 wks from MD whom she saw 03/03/16. She tolerated prone strenghening and gentle ER well. She has tightness and soreness in post RC muscles as well as subscapularis. ROM is progressing. Goals are ongoing.   Pt will benefit from skilled therapeutic intervention in order to improve on the following deficits Pain;Decreased  range of motion;Decreased activity tolerance;Decreased strength;Impaired UE functional use   Rehab Potential Excellent   PT Frequency 3x / week   PT Duration 4 weeks   PT Treatment/Interventions ADLs/Self Care Home Management;Electrical Stimulation;Cryotherapy;Moist Heat;Therapeutic exercise;Therapeutic activities;Ultrasound;Neuromuscular re-education;Patient/family education;Manual techniques;Passive range of motion;Vasopneumatic Device  PT Next Visit Plan New Rx for ROM and strenghening. Refer to scanned protocol for guidance. Continue STW to posterior RC and scapular mobs.        Problem List Patient Active Problem List   Diagnosis Date Noted  . Diverticulitis large intestine 01/07/2015  . Diverticulitis 07/03/2014  . Hypokalemia 07/03/2014  . Tobacco abuse   . Obesity   . Upper airway cough syndrome 01/28/2014  . IBS (irritable bowel syndrome) 07/10/2013  . Acute bilateral lower abdominal pain 03/26/2013  . Tiredness 03/26/2013  . Sigmoid diverticulitis 09/12/2012  . Epigastric pain 05/22/2012  . Nausea and vomiting 01/28/2012  . Crohn's disease of ileum ([place]) 01/10/2012  . GERD (gastroesophageal reflux disease) 01/10/2012  . HTN (hypertension) 01/10/2012    Madelyn Flavors PT 03/04/2016, 12:38 PM  [facility] [address] [place], [place], [address] Phone: [phone]   Fax:  [phone]  Name: Elizabeth [name] MRN: [id number] Date of Birth: [birth year]

## 2016-03-08 ENCOUNTER — Ambulatory Visit: Payer: PRIVATE HEALTH INSURANCE | Admitting: Physical Therapy

## 2016-03-08 ENCOUNTER — Encounter: Payer: Self-pay | Admitting: Physical Therapy

## 2016-03-08 DIAGNOSIS — M25511 Pain in right shoulder: Secondary | ICD-10-CM

## 2016-03-08 DIAGNOSIS — M25611 Stiffness of right shoulder, not elsewhere classified: Secondary | ICD-10-CM

## 2016-03-08 NOTE — Therapy (Signed)
[facility] [place], [place], [address] Phone: [phone]   Fax:  [phone]  Physical Therapy Treatment  Patient Details  Name: Elizabeth [name] MRN: [id number] Date of Birth: 10/29/1961 Referring Provider: Meredith Pel MD.  Encounter Date: 03/08/2016      PT End of Session - 03/08/16 1150    Visit Number 6   Number of Visits 17   Date for PT Re-Evaluation 04/01/16   PT Start Time 1114   PT Stop Time 1200   PT Time Calculation (min) 46 min   Activity Tolerance Patient tolerated treatment well   Behavior During Therapy [facility] for tasks assessed/performed      Past Medical History  Diagnosis Date  . Crohn's disease ([place])   . Elevated transaminase level   . Hypertension   . Bronchitis   . Cough 01/28/2014    upper airway cough syndrome  . Diverticulitis   . Tobacco abuse   . Obesity   . GERD (gastroesophageal reflux disease)   . Pneumonia 1/15    Past Surgical History  Procedure Laterality Date  . Hemocolectomy  2005    right  . Abdominal hysterectomy  1980  . Appendectomy    . Colonoscopy    . Esophagogastroduodenoscopy  06/16/2012    Procedure: ESOPHAGOGASTRODUODENOSCOPY (EGD);  Surgeon: Rogene Houston, MD;  [facility];  Service: Endoscopy;  Laterality: N/A;  10:30 AM  . Colonoscopy N/A 08/16/2014    Procedure: COLONOSCOPY;  Surgeon: Rogene Houston, MD;  [facility];  Service: Endoscopy;  Laterality: N/A;  1200-rescheduled to 9/4 @ 10:45 Ann notified pt  . Laparoscopic sigmoid colectomy N/A 01/07/2015    Procedure: LOW ANTERIOR COLON RESECTION;  Surgeon: Fanny Skates, MD;  Location: [place];  Service: General;  Laterality: N/A;  . Shoulder arthroscopy with labral repair Right 01/20/2016    Procedure: SHOULDER ARTHROSCOPY WITH LABRAL REPAIR;  Surgeon: Meredith Pel, MD;  Location: [place];  Service: Orthopedics;  Laterality: Right;    There were no vitals filed for this  visit.  Visit Diagnosis:  Decreased range of motion of right shoulder  Pain in joint of right shoulder      Subjective Assessment - 03/08/16 1126    Subjective Patient has increased pain after going shopping and trying on clothes   Pertinent History HTN, Crohns Disease   Diagnostic tests mri, xray; results: mod RC tendinopathy/tendinosis, shallow tears of supraspinatus (no full thickness tears); ant/inf labral tear; mod GH degenerative changes   Patient Stated Goals Get back to work.   Currently in Pain? Yes   Pain Score 4    Pain Location Shoulder   Pain Orientation Right   Pain Descriptors / Indicators Sore   Pain Type Surgical pain   Pain Onset More than a month ago   Pain Frequency Intermittent   Aggravating Factors  only movement of shoulder   Pain Relieving Factors rest            [facility] PT Assessment - 03/08/16 0001    AROM   AROM Assessment Site Shoulder   Right Shoulder External Rotation 45 Degrees   PROM   Right/Left Shoulder Right   Right Shoulder Flexion 140 Degrees   Right Shoulder External Rotation 48 Degrees                     [facility] Adult PT Treatment/Exercise - 03/08/16 0001    Shoulder Exercises: Supine  Other Supine Exercises cane for elevation and ER 2x10 each   Shoulder Exercises: Pulleys   Flexion --  37mn   Other Pulley Exercises UE ranger for AAROM elevation and small circles 2x10 each   Modalities   Modalities Moist Heat   Electrical Stimulation   Electrical Stimulation Location R shoulder   Electrical Stimulation Action premod   Electrical Stimulation Parameters 80-_0    Electrical Stimulation Goals Pain   Manual Therapy   Manual Therapy Passive ROM   Passive ROM gentle ROM for right shoulder flexion and ER                  PT Short Term Goals - 03/08/16 1140    PT SHORT TERM GOAL #1   Title I with inital HEP   Time 2   Period Weeks   Status Achieved   PT SHORT TERM GOAL #2   Title decrease pain in R  shoulder by 25% with ADLS.   Time 2   Period Weeks   Status Achieved  03/08/16            PT Long Term Goals - 03/08/16 1141    PT LONG TERM GOAL #1   Title Able to perform ADLs including work requirements with R shoulder pain 2/10 or less.   Time 4   Period Weeks   Status On-going   PT LONG TERM GOAL #2   Title Demo active R shoulder flex to 165 deg to improve function.   Time 4   Period Weeks   Status On-going   PT LONG TERM GOAL #3   Title Demo 5/5 R shoulder strength   Time 4   Period Weeks   Status On-going               Plan - 03/08/16 1152    Clinical Impression Statement Patient progressing slowly due to a flare up after trying on shirts causing pain, patient felt so good after last treatment and did too much with use of arm. Patient reports decreased pain overall and performs ADL's with pain decreased by more than 25%. Patient unable to tolerate strengthening exercises due to soreness. Decreased ER ROM due to pain. STG #2 met and LT Goals ongoing dur to pain, ROM and strength deficits.   Pt will benefit from skilled therapeutic intervention in order to improve on the following deficits Pain;Decreased range of motion;Decreased activity tolerance;Decreased strength;Impaired UE functional use   Rehab Potential Excellent   PT Frequency 3x / week   PT Duration 4 weeks   PT Treatment/Interventions ADLs/Self Care Home Management;Electrical Stimulation;Cryotherapy;Moist Heat;Therapeutic exercise;Therapeutic activities;Ultrasound;Neuromuscular re-education;Patient/family education;Manual techniques;Passive range of motion;Vasopneumatic Device   PT Next Visit Plan cont per patient tolerance/ New Rx for ROM and strenghening. Refer to scanned protocol for guidance. Continue STW to posterior RC and scapular mobs.   Consulted and Agree with Plan of Care Patient        Problem List Patient Active Problem List   Diagnosis Date Noted  . Diverticulitis large intestine  01/07/2015  . Diverticulitis 07/03/2014  . Hypokalemia 07/03/2014  . Tobacco abuse   . Obesity   . Upper airway cough syndrome 01/28/2014  . IBS (irritable bowel syndrome) 07/10/2013  . Acute bilateral lower abdominal pain 03/26/2013  . Tiredness 03/26/2013  . Sigmoid diverticulitis 09/12/2012  . Epigastric pain 05/22/2012  . Nausea and vomiting 01/28/2012  . Cro[place]'s disease of ileum ([place] 01/10/2012  . GERD (gastroesophageal reflux disease) 01/10/2012  . HTN (hypertension) 01/10/2012  Phillips Climes, PTA 03/08/2016, 12:02 PM  [facility] [place], [place], [address] Phone: [phone]   Fax:  [phone]  Name: BRYN SALINE MRN: [id number] Date of Birth: March 27, 1961

## 2016-03-10 ENCOUNTER — Ambulatory Visit: Payer: PRIVATE HEALTH INSURANCE | Admitting: Physical Therapy

## 2016-03-10 DIAGNOSIS — M25511 Pain in right shoulder: Secondary | ICD-10-CM

## 2016-03-10 DIAGNOSIS — R531 Weakness: Secondary | ICD-10-CM

## 2016-03-10 DIAGNOSIS — M25611 Stiffness of right shoulder, not elsewhere classified: Secondary | ICD-10-CM

## 2016-03-10 DIAGNOSIS — R5381 Other malaise: Secondary | ICD-10-CM

## 2016-03-10 NOTE — Therapy (Signed)
[facility] [place], [place], [address] Phone: [phone]   Fax:  [phone]  Physical Therapy Treatment  Patient Details  Name: Elizabeth [name] MRN: [id number] Date of Birth: [birth year] Referring Provider: Meredith Pel MD.  Encounter Date: 03/10/2016      PT End of Session - 03/10/16 1256    Visit Number 7   Number of Visits 17   Date for PT Re-Evaluation 04/01/16   PT Start Time 1115   PT Stop Time 1202   PT Time Calculation (min) 47 min      Past Medical History  Diagnosis Date  . Crohn's disease ([place])   . Elevated transaminase level   . Hypertension   . Bronchitis   . Cough 01/28/2014    upper airway cough syndrome  . Diverticulitis   . Tobacco abuse   . Obesity   . GERD (gastroesophageal reflux disease)   . Pneumonia 1/15    Past Surgical History  Procedure Laterality Date  . Hemocolectomy  2005    right  . Abdominal hysterectomy  1980  . Appendectomy    . Colonoscopy    . Esophagogastroduodenoscopy  06/16/2012    Procedure: ESOPHAGOGASTRODUODENOSCOPY (EGD);  Surgeon: Rogene Houston, MD;  [facility];  Service: Endoscopy;  Laterality: N/A;  10:30 AM  . Colonoscopy N/A 08/16/2014    Procedure: COLONOSCOPY;  Surgeon: Rogene Houston, MD;  [facility];  Service: Endoscopy;  Laterality: N/A;  1200-rescheduled to 9/4 @ 10:45 Ann notified pt  . Laparoscopic sigmoid colectomy N/A 01/07/2015    Procedure: LOW ANTERIOR COLON RESECTION;  Surgeon: Fanny Skates, MD;  Location: [place];  Service: General;  Laterality: N/A;  . Shoulder arthroscopy with labral repair Right 01/20/2016    Procedure: SHOULDER ARTHROSCOPY WITH LABRAL REPAIR;  Surgeon: Meredith Pel, MD;  Location: [place];  Service: Orthopedics;  Laterality: Right;    There were no vitals filed for this visit.  Visit Diagnosis:  Decreased range of motion of right shoulder  Pain in joint of right  shoulder  Debility  Right shoulder pain  Stiffness of right shoulder joint  Weakness                                 PT Short Term Goals - 03/08/16 1140    PT SHORT TERM GOAL #1   Title I with inital HEP   Time 2   Period Weeks   Status Achieved   PT SHORT TERM GOAL #2   Title decrease pain in R shoulder by 25% with ADLS.   Time 2   Period Weeks   Status Achieved  03/08/16            PT Long Term Goals - 03/08/16 1141    PT LONG TERM GOAL #1   Title Able to perform ADLs including work requirements with R shoulder pain 2/10 or less.   Time 4   Period Weeks   Status On-going   PT LONG TERM GOAL #2   Title Demo active R shoulder flex to 165 deg to improve function.   Time 4   Period Weeks   Status On-going   PT LONG TERM GOAL #3   Title Demo 5/5 R shoulder strength   Time 4   Period Weeks   Status On-going  Problem List Patient Active Problem List   Diagnosis Date Noted  . Diverticulitis large intestine 01/07/2015  . Diverticulitis 07/03/2014  . Hypokalemia 07/03/2014  . Tobacco abuse   . Obesity   . Upper airway cough syndrome 01/28/2014  . IBS (irritable bowel syndrome) 07/10/2013  . Acute bilateral lower abdominal pain 03/26/2013  . Tiredness 03/26/2013  . Sigmoid diverticulitis 09/12/2012  . Epigastric pain 05/22/2012  . Nausea and vomiting 01/28/2012  . Crohn's disease of ileum ([place]) 01/10/2012  . GERD (gastroesophageal reflux disease) 01/10/2012  . HTN (hypertension) 01/10/2012  Treatment:  Passive-assistive UBE @ 120 RPM's x 8 minutes f/b pulleys x 5 minutes f/b ROM in supine to patient's right shoulder per protocol x 25 minutes.  Khaleelah Yowell, [place] MPT 03/10/2016, 1:19 PM  [facility] [address] [place], [place], [address] Phone: [phone]   Fax:  [phone]  Name: Elizabeth [name] MRN: [id number] Date of Birth: Aug 01, 1961

## 2016-03-12 ENCOUNTER — Ambulatory Visit: Payer: PRIVATE HEALTH INSURANCE | Admitting: Physical Therapy

## 2016-03-12 DIAGNOSIS — M25611 Stiffness of right shoulder, not elsewhere classified: Secondary | ICD-10-CM

## 2016-03-12 DIAGNOSIS — M25511 Pain in right shoulder: Secondary | ICD-10-CM

## 2016-03-12 NOTE — Therapy (Signed)
[facility] [place], [place], [address] Phone: [phone]   Fax:  [phone]  Physical Therapy Treatment  Patient Details  Name: Elizabeth [name] MRN: [id number] Date of Birth: 1961/11/30 Referring Provider: Meredith Pel MD.  Encounter Date: 03/12/2016      PT End of Session - 03/12/16 1650    Visit Number 8   Number of Visits 17   Date for PT Re-Evaluation 04/01/16   PT Start Time 1030   PT Stop Time 1119   PT Time Calculation (min) 49 min   Activity Tolerance Patient tolerated treatment well   Behavior During Therapy [facility] for tasks assessed/performed      Past Medical History  Diagnosis Date  . Crohn's disease ([place])   . Elevated transaminase level   . Hypertension   . Bronchitis   . Cough 01/28/2014    upper airway cough syndrome  . Diverticulitis   . Tobacco abuse   . Obesity   . GERD (gastroesophageal reflux disease)   . Pneumonia 1/15    Past Surgical History  Procedure Laterality Date  . Hemocolectomy  2005    right  . Abdominal hysterectomy  1980  . Appendectomy    . Colonoscopy    . Esophagogastroduodenoscopy  06/16/2012    Procedure: ESOPHAGOGASTRODUODENOSCOPY (EGD);  Surgeon: Rogene Houston, MD;  [facility];  Service: Endoscopy;  Laterality: N/A;  10:30 AM  . Colonoscopy N/A 08/16/2014    Procedure: COLONOSCOPY;  Surgeon: Rogene Houston, MD;  [facility];  Service: Endoscopy;  Laterality: N/A;  1200-rescheduled to 9/4 @ 10:45 Ann notified pt  . Laparoscopic sigmoid colectomy N/A 01/07/2015    Procedure: LOW ANTERIOR COLON RESECTION;  Surgeon: Fanny Skates, MD;  Location: [place];  Service: General;  Laterality: N/A;  . Shoulder arthroscopy with labral repair Right 01/20/2016    Procedure: SHOULDER ARTHROSCOPY WITH LABRAL REPAIR;  Surgeon: Meredith Pel, MD;  Location: [place];  Service: Orthopedics;  Laterality: Right;    There were no vitals filed for this  visit.  Visit Diagnosis:  Right shoulder pain  Stiffness of right shoulder joint      Subjective Assessment - 03/12/16 1644    Subjective I don't feel like I getting better.   Pain Score 4    Pain Location Shoulder   Pain Orientation Right   Pain Descriptors / Indicators Sore   Pain Type Surgical pain   Pain Onset More than a month ago                         [facility] Adult PT Treatment/Exercise - 03/12/16 0001    Shoulder Exercises: Pulleys   Other Pulley Exercises UE Ranger x 6 minutes.   Other Pulley Exercises 6 minutes.   Shoulder Exercises: ROM/Strengthening   UBE (Upper Arm Bike) Passive-assistive UBE @ 120 RPM's x 8 minutes.   Manual Therapy   Manual Therapy Passive ROM   Passive ROM In supine passive-assistive right shoulder range of motion x 18 minutes into flexion and ER.                  PT Short Term Goals - 03/08/16 1140    PT SHORT TERM GOAL #1   Title I with inital HEP   Time 2   Period Weeks   Status Achieved   PT SHORT TERM GOAL #2   Title decrease pain in R shoulder by  25% with ADLS.   Time 2   Period Weeks   Status Achieved  03/08/16            PT Long Term Goals - 03/08/16 1141    PT LONG TERM GOAL #1   Title Able to perform ADLs including work requirements with R shoulder pain 2/10 or less.   Time 4   Period Weeks   Status On-going   PT LONG TERM GOAL #2   Title Demo active R shoulder flex to 165 deg to improve function.   Time 4   Period Weeks   Status On-going   PT LONG TERM GOAL #3   Title Demo 5/5 R shoulder strength   Time 4   Period Weeks   Status On-going               Problem List Patient Active Problem List   Diagnosis Date Noted  . Diverticulitis large intestine 01/07/2015  . Diverticulitis 07/03/2014  . Hypokalemia 07/03/2014  . Tobacco abuse   . Obesity   . Upper airway cough syndrome 01/28/2014  . IBS (irritable bowel syndrome) 07/10/2013  . Acute bilateral lower abdominal  pain 03/26/2013  . Tiredness 03/26/2013  . Sigmoid diverticulitis 09/12/2012  . Epigastric pain 05/22/2012  . Nausea and vomiting 01/28/2012  . Crohn's disease of ileum ([place]) 01/10/2012  . GERD (gastroesophageal reflux disease) 01/10/2012  . HTN (hypertension) 01/10/2012    Nemesis [place], [place]  MPT  03/12/2016, 4:53 PM  [facility] [place], [place], [address] Phone: [phone]   Fax:  [phone]  Name: Elizabeth [name] MRN: [id number] Date of Birth: May 01, 1961

## 2016-03-15 ENCOUNTER — Ambulatory Visit: Payer: Worker's Compensation | Attending: Orthopedic Surgery | Admitting: Physical Therapy

## 2016-03-15 ENCOUNTER — Encounter: Payer: Self-pay | Admitting: Physical Therapy

## 2016-03-15 DIAGNOSIS — M25611 Stiffness of right shoulder, not elsewhere classified: Secondary | ICD-10-CM | POA: Diagnosis present

## 2016-03-15 DIAGNOSIS — M25511 Pain in right shoulder: Secondary | ICD-10-CM | POA: Insufficient documentation

## 2016-03-15 NOTE — Therapy (Signed)
[facility] [place], [place], [address] Phone: [phone]   Fax:  [phone]  Physical Therapy Treatment  Patient Details  Name: Elizabeth [name] MRN: [id number] Date of Birth: 08-09-1961 Referring Provider: Meredith Pel MD.  Encounter Date: 03/15/2016      PT End of Session - 03/15/16 1018    Visit Number 9   Number of Visits 17   Date for PT Re-Evaluation 04/01/16   PT Start Time 0945   PT Stop Time 1029   PT Time Calculation (min) 44 min   Activity Tolerance Patient tolerated treatment well   Behavior During Therapy [facility] for tasks assessed/performed      Past Medical History  Diagnosis Date  . Crohn's disease ([place])   . Elevated transaminase level   . Hypertension   . Bronchitis   . Cough 01/28/2014    upper airway cough syndrome  . Diverticulitis   . Tobacco abuse   . Obesity   . GERD (gastroesophageal reflux disease)   . Pneumonia 1/15    Past Surgical History  Procedure Laterality Date  . Hemocolectomy  2005    right  . Abdominal hysterectomy  1980  . Appendectomy    . Colonoscopy    . Esophagogastroduodenoscopy  06/16/2012    Procedure: ESOPHAGOGASTRODUODENOSCOPY (EGD);  Surgeon: Rogene Houston, MD;  [facility];  Service: Endoscopy;  Laterality: N/A;  10:30 AM  . Colonoscopy N/A 08/16/2014    Procedure: COLONOSCOPY;  Surgeon: Rogene Houston, MD;  [facility];  Service: Endoscopy;  Laterality: N/A;  1200-rescheduled to 9/4 @ 10:45 Ann notified pt  . Laparoscopic sigmoid colectomy N/A 01/07/2015    Procedure: LOW ANTERIOR COLON RESECTION;  Surgeon: Fanny Skates, MD;  Location: [place];  Service: General;  Laterality: N/A;  . Shoulder arthroscopy with labral repair Right 01/20/2016    Procedure: SHOULDER ARTHROSCOPY WITH LABRAL REPAIR;  Surgeon: Meredith Pel, MD;  Location: [place];  Service: Orthopedics;  Laterality: Right;    There were no vitals filed for this  visit.  Visit Diagnosis:  Right shoulder pain  Decreased range of motion of right shoulder      Subjective Assessment - 03/15/16 1003    Subjective Patient feels some better and was pulling weeds over weekend   Pertinent History HTN, Crohns Disease   Diagnostic tests mri, xray; results: mod RC tendinopathy/tendinosis, shallow tears of supraspinatus (no full thickness tears); ant/inf labral tear; mod GH degenerative changes   Patient Stated Goals Get back to work.   Currently in Pain? Yes   Pain Score 4    Pain Location Shoulder   Pain Orientation Right   Pain Descriptors / Indicators Sore   Pain Type Surgical pain   Pain Onset More than a month ago   Pain Frequency Intermittent   Aggravating Factors  movement   Pain Relieving Factors rest                         [facility] Adult PT Treatment/Exercise - 03/15/16 0001    Shoulder Exercises: Prone   Other Prone Exercises kneeling for rows and horiz abd 3x10 no weight   Shoulder Exercises: Pulleys   Flexion --  56mn   Other Pulley Exercises UE ranger for elevation 3x10   Other Pulley Exercises IR/ER small range with yellow t-band 3x10   Manual Therapy   Manual Therapy Passive ROM   Passive ROM passive-assistive right  shoulder range of motion into flexion /ER                  PT Short Term Goals - 03/08/16 1140    PT SHORT TERM GOAL #1   Title I with inital HEP   Time 2   Period Weeks   Status Achieved   PT SHORT TERM GOAL #2   Title decrease pain in R shoulder by 25% with ADLS.   Time 2   Period Weeks   Status Achieved  03/08/16            PT Long Term Goals - 03/08/16 1141    PT LONG TERM GOAL #1   Title Able to perform ADLs including work requirements with R shoulder pain 2/10 or less.   Time 4   Period Weeks   Status On-going   PT LONG TERM GOAL #2   Title Demo active R shoulder flex to 165 deg to improve function.   Time 4   Period Weeks   Status On-going   PT LONG TERM GOAL #3    Title Demo 5/5 R shoulder strength   Time 4   Period Weeks   Status On-going               Plan - 03/15/16 1027    Clinical Impression Statement Patient progresing slowly this week. Patient reports doing ADL's and using arm more than she should. Educated and reviewed protocol with patient today to avoid re-injury. Progressed patient per protocol with no increased pain during or after treatment. Unable to meet any further goals due to protocol limitations.   Pt will benefit from skilled therapeutic intervention in order to improve on the following deficits Pain;Decreased range of motion;Decreased activity tolerance;Decreased strength;Impaired UE functional use   Rehab Potential Excellent   PT Frequency 3x / week   PT Duration 4 weeks   PT Treatment/Interventions ADLs/Self Care Home Management;Electrical Stimulation;Cryotherapy;Moist Heat;Therapeutic exercise;Therapeutic activities;Ultrasound;Neuromuscular re-education;Patient/family education;Manual techniques;Passive range of motion;Vasopneumatic Device   PT Next Visit Plan cont per patient tolerance/ New Rx for ROM and strenghening. Refer to scanned protocol for guidance. Continue STW to posterior RC and scapular mobs.   Consulted and Agree with Plan of Care Patient        Problem List Patient Active Problem List   Diagnosis Date Noted  . Diverticulitis large intestine 01/07/2015  . Diverticulitis 07/03/2014  . Hypokalemia 07/03/2014  . Tobacco abuse   . Obesity   . Upper airway cough syndrome 01/28/2014  . IBS (irritable bowel syndrome) 07/10/2013  . Acute bilateral lower abdominal pain 03/26/2013  . Tiredness 03/26/2013  . Sigmoid diverticulitis 09/12/2012  . Epigastric pain 05/22/2012  . Nausea and vomiting 01/28/2012  . Crohn's disease of ileum ([place]) 01/10/2012  . GERD (gastroesophageal reflux disease) 01/10/2012  . HTN (hypertension) 01/10/2012    DUNFORD, CHRISTINA P, PTA 03/15/2016, 10:40 AM  [facility] [place], [place], [address] Phone: [phone]   Fax:  [phone]  Name: Elizabeth [name] MRN: [id number] Date of Birth: 1961/11/14

## 2016-03-17 ENCOUNTER — Ambulatory Visit: Payer: Worker's Compensation | Admitting: Physical Therapy

## 2016-03-17 DIAGNOSIS — M25511 Pain in right shoulder: Secondary | ICD-10-CM

## 2016-03-17 DIAGNOSIS — M25611 Stiffness of right shoulder, not elsewhere classified: Secondary | ICD-10-CM

## 2016-03-17 NOTE — Therapy (Signed)
[facility] [place], [place], [address] Phone: [phone]   Fax:  [phone]  Physical Therapy Treatment  Patient Details  Name: Elizabeth [name] MRN: [id number] Date of Birth: 02-28-1961 Referring Provider: Meredith Pel MD.  Encounter Date: 03/17/2016      PT End of Session - 03/17/16 1252    Visit Number 10   Number of Visits 17   Date for PT Re-Evaluation 04/01/16   PT Start Time 0945   PT Stop Time 1033   PT Time Calculation (min) 48 min   Activity Tolerance Patient tolerated treatment well   Behavior During Therapy [facility] for tasks assessed/performed      Past Medical History  Diagnosis Date  . Crohn's disease ([place])   . Elevated transaminase level   . Hypertension   . Bronchitis   . Cough 01/28/2014    upper airway cough syndrome  . Diverticulitis   . Tobacco abuse   . Obesity   . GERD (gastroesophageal reflux disease)   . Pneumonia 1/15    Past Surgical History  Procedure Laterality Date  . Hemocolectomy  2005    right  . Abdominal hysterectomy  1980  . Appendectomy    . Colonoscopy    . Esophagogastroduodenoscopy  06/16/2012    Procedure: ESOPHAGOGASTRODUODENOSCOPY (EGD);  Surgeon: Rogene Houston, MD;  [facility];  Service: Endoscopy;  Laterality: N/A;  10:30 AM  . Colonoscopy N/A 08/16/2014    Procedure: COLONOSCOPY;  Surgeon: Rogene Houston, MD;  [facility];  Service: Endoscopy;  Laterality: N/A;  1200-rescheduled to 9/4 @ 10:45 Ann notified pt  . Laparoscopic sigmoid colectomy N/A 01/07/2015    Procedure: LOW ANTERIOR COLON RESECTION;  Surgeon: Fanny Skates, MD;  Location: [place];  Service: General;  Laterality: N/A;  . Shoulder arthroscopy with labral repair Right 01/20/2016    Procedure: SHOULDER ARTHROSCOPY WITH LABRAL REPAIR;  Surgeon: Meredith Pel, MD;  Location: [place];  Service: Orthopedics;  Laterality: Right;    There were no vitals filed for this  visit.  Visit Diagnosis:  Right shoulder pain  Stiffness of right shoulder, not elsewhere classified      Subjective Assessment - 03/17/16 1249    Subjective Is this gonna get better?   Pain Score 4    Pain Location Shoulder   Pain Orientation Right   Pain Descriptors / Indicators Sore   Pain Type Surgical pain   Pain Onset More than a month ago                         [facility] Adult PT Treatment/Exercise - 03/17/16 0001    Shoulder Exercises: Pulleys   Flexion Limitations 10 minutes.   Manual Therapy   Manual Therapy Passive ROM   Manual therapy comments In supine right  shoulder PROM--passive-assistive and rhy stabs at 90 degrees into flexion and ER x 28 degrees.                  PT Short Term Goals - 03/08/16 1140    PT SHORT TERM GOAL #1   Title I with inital HEP   Time 2   Period Weeks   Status Achieved   PT SHORT TERM GOAL #2   Title decrease pain in R shoulder by 25% with ADLS.   Time 2   Period Weeks   Status Achieved  03/08/16  PT Long Term Goals - 03/08/16 1141    PT LONG TERM GOAL #1   Title Able to perform ADLs including work requirements with R shoulder pain 2/10 or less.   Time 4   Period Weeks   Status On-going   PT LONG TERM GOAL #2   Title Demo active R shoulder flex to 165 deg to improve function.   Time 4   Period Weeks   Status On-going   PT LONG TERM GOAL #3   Title Demo 5/5 R shoulder strength   Time 4   Period Weeks   Status On-going               Problem List Patient Active Problem List   Diagnosis Date Noted  . Diverticulitis large intestine 01/07/2015  . Diverticulitis 07/03/2014  . Hypokalemia 07/03/2014  . Tobacco abuse   . Obesity   . Upper airway cough syndrome 01/28/2014  . IBS (irritable bowel syndrome) 07/10/2013  . Acute bilateral lower abdominal pain 03/26/2013  . Tiredness 03/26/2013  . Sigmoid diverticulitis 09/12/2012  . Epigastric pain 05/22/2012  . Nausea and  vomiting 01/28/2012  . Crohn's disease of ileum ([place]) 01/10/2012  . GERD (gastroesophageal reflux disease) 01/10/2012  . HTN (hypertension) 01/10/2012    Britian Jentz, [place] MPT 03/17/2016, 12:54 PM  [facility] [address] [place], [place], [address] Phone: [phone]   Fax:  [phone]  Name: MAIKA MCELVEEN MRN: [id number] Date of Birth: 03-Dec-1961

## 2016-03-19 ENCOUNTER — Encounter: Payer: PRIVATE HEALTH INSURANCE | Admitting: Physical Therapy

## 2016-03-22 ENCOUNTER — Encounter: Payer: Self-pay | Admitting: Physical Therapy

## 2016-03-22 ENCOUNTER — Ambulatory Visit: Payer: Worker's Compensation | Admitting: Physical Therapy

## 2016-03-22 DIAGNOSIS — M25611 Stiffness of right shoulder, not elsewhere classified: Secondary | ICD-10-CM

## 2016-03-22 DIAGNOSIS — M25511 Pain in right shoulder: Secondary | ICD-10-CM | POA: Diagnosis not present

## 2016-03-22 NOTE — Therapy (Signed)
[facility] [place], [place], [address] Phone: [phone]   Fax:  [phone]  Physical Therapy Treatment  Patient Details  Name: Elizabeth [name] MRN: [id number] Date of Birth: 10/06/1961 Referring Provider: Meredith Pel MD.  Encounter Date: 03/22/2016      PT End of Session - 03/22/16 1156    Visit Number 11   Number of Visits 17   Date for PT Re-Evaluation 04/01/16   PT Start Time 1115   PT Stop Time 1156   PT Time Calculation (min) 41 min   Activity Tolerance Patient tolerated treatment well;Patient limited by pain   Behavior During Therapy [facility] for tasks assessed/performed      Past Medical History  Diagnosis Date  . Crohn's disease ([place])   . Elevated transaminase level   . Hypertension   . Bronchitis   . Cough 01/28/2014    upper airway cough syndrome  . Diverticulitis   . Tobacco abuse   . Obesity   . GERD (gastroesophageal reflux disease)   . Pneumonia 1/15    Past Surgical History  Procedure Laterality Date  . Hemocolectomy  2005    right  . Abdominal hysterectomy  1980  . Appendectomy    . Colonoscopy    . Esophagogastroduodenoscopy  06/16/2012    Procedure: ESOPHAGOGASTRODUODENOSCOPY (EGD);  Surgeon: Rogene Houston, MD;  [facility];  Service: Endoscopy;  Laterality: N/A;  10:30 AM  . Colonoscopy N/A 08/16/2014    Procedure: COLONOSCOPY;  Surgeon: Rogene Houston, MD;  [facility];  Service: Endoscopy;  Laterality: N/A;  1200-rescheduled to 9/4 @ 10:45 Ann notified pt  . Laparoscopic sigmoid colectomy N/A 01/07/2015    Procedure: LOW ANTERIOR COLON RESECTION;  Surgeon: Fanny Skates, MD;  Location: [place];  Service: General;  Laterality: N/A;  . Shoulder arthroscopy with labral repair Right 01/20/2016    Procedure: SHOULDER ARTHROSCOPY WITH LABRAL REPAIR;  Surgeon: Meredith Pel, MD;  Location: [place];  Service: Orthopedics;  Laterality: Right;    There were no vitals  filed for this visit.      Subjective Assessment - 03/22/16 1119    Subjective Patient reported shoulder being very sore today, she reported using arm for ADL's (cleaning house and mopping)   Pertinent History HTN, Crohns Disease   Diagnostic tests mri, xray; results: mod RC tendinopathy/tendinosis, shallow tears of supraspinatus (no full thickness tears); ant/inf labral tear; mod GH degenerative changes   Patient Stated Goals Get back to work.   Currently in Pain? Yes   Pain Score 5    Pain Location Shoulder   Pain Orientation Right   Pain Descriptors / Indicators Sore   Pain Type Surgical pain   Pain Onset More than a month ago   Pain Frequency Intermittent   Aggravating Factors  movement   Pain Relieving Factors rest            [facility] PT Assessment - 03/22/16 0001    AROM   AROM Assessment Site Shoulder   Right Shoulder Flexion 118 Degrees   PROM   Right/Left Shoulder Right   Right Shoulder Flexion 142 Degrees   Right Shoulder External Rotation 53 Degrees                     [facility] Adult PT Treatment/Exercise - 03/22/16 0001    Shoulder Exercises: Standing   External Rotation Strengthening;Right;Theraband  3x10 small range   Theraband Level (Shoulder External  Rotation) Level 1 (Yellow)   Internal Rotation Strengthening;Right;Theraband  3 x 10 small range   Theraband Level (Shoulder Internal Rotation) Level 1 (Yellow)   Other Standing Exercises 4 way isometrics 10sec x 10 each way   Shoulder Exercises: Pulleys   Flexion 3 minutes   Other Pulley Exercises UE ranger for elevation 3x10   Manual Therapy   Manual Therapy Passive ROM   Passive ROM Gentle slow PROM to right shoulder flexion/ER                   PT Short Term Goals - 03/08/16 1140    PT SHORT TERM GOAL #1   Title I with inital HEP   Time 2   Period Weeks   Status Achieved   PT SHORT TERM GOAL #2   Title decrease pain in R shoulder by 25% with ADLS.   Time 2   Period Weeks    Status Achieved  03/08/16            PT Long Term Goals - 03/22/16 1142    PT LONG TERM GOAL #1   Title Able to perform ADLs including work requirements with R shoulder pain 2/10 or less.   Time 4   Period Weeks   Status On-going  patient has not returned to work 03/22/16   PT LONG TERM GOAL #2   Title Demo active R shoulder flex to 165 deg to improve function.   Time 4   Period Weeks   Status On-going  AROM 118 degrees 03/22/16   PT LONG TERM GOAL #3   Title Demo 5/5 R shoulder strength   Time 4   Period Weeks   Status On-going               Plan - 03/22/16 1157    Clinical Impression Statement Patient slowly progressing due to high pain level. Patient has improved ROM yet reports pain with movements. Patient reported doing HEP daily. Reviewed HEP and activities with patient per protocol and required cues for technique. Goals ongoing due to pain, ROM and pain deficits.   Rehab Potential Excellent   Clinical Impairments Affecting Rehab Potential 01/20/16 surgery current 9 weeks 03/23/16   PT Frequency 3x / week   PT Duration 4 weeks   PT Treatment/Interventions ADLs/Self Care Home Management;Electrical Stimulation;Cryotherapy;Moist Heat;Therapeutic exercise;Therapeutic activities;Ultrasound;Neuromuscular re-education;Patient/family education;Manual techniques;Passive range of motion;Vasopneumatic Device   PT Next Visit Plan cont per patient tolerance/ New Rx for ROM and strenghening. Refer to scanned protocol for guidance. Continue STW to posterior RC and scapular mobs. /MDMarlou Sa 03/31/16   Consulted and Agree with Plan of Care Patient      Patient will benefit from skilled therapeutic intervention in order to improve the following deficits and impairments:  Pain, Decreased range of motion, Decreased activity tolerance, Decreased strength, Impaired UE functional use  Visit Diagnosis: Right shoulder pain  Stiffness of right shoulder, not elsewhere  classified  Decreased range of motion of right shoulder     Problem List Patient Active Problem List   Diagnosis Date Noted  . Diverticulitis large intestine 01/07/2015  . Diverticulitis 07/03/2014  . Hypokalemia 07/03/2014  . Tobacco abuse   . Obesity   . Upper airway cough syndrome 01/28/2014  . IBS (irritable bowel syndrome) 07/10/2013  . Acute bilateral lower abdominal pain 03/26/2013  . Tiredness 03/26/2013  . Sigmoid diverticulitis 09/12/2012  . Epigastric pain 05/22/2012  . Nausea and vomiting 01/28/2012  . Crohn's disease of ileum ([place])  01/10/2012  . GERD (gastroesophageal reflux disease) 01/10/2012  . HTN (hypertension) 01/10/2012    Candra Wegner P, PTA 03/22/2016, 12:08 PM  [facility] [place], [place], [address] Phone: [phone]   Fax:  [phone]  Name: Elizabeth [name] MRN: [id number] Date of Birth: 29-Nov-1961

## 2016-03-22 NOTE — Therapy (Signed)
[facility] [place], [place], [address] Phone: [phone]   Fax:  [phone]  Physical Therapy Treatment  Patient Details  Name: Elizabeth [name] MRN: [id number] Date of Birth: Mar 17, 1961 Referring Provider: Meredith Pel MD.  Encounter Date: 03/22/2016      PT End of Session - 03/22/16 1156    Visit Number 11   Number of Visits 17   Date for PT Re-Evaluation 04/01/16   PT Start Time 1115   PT Stop Time 1156   PT Time Calculation (min) 41 min   Activity Tolerance Patient tolerated treatment well;Patient limited by pain   Behavior During Therapy [facility] for tasks assessed/performed      Past Medical History  Diagnosis Date  . Crohn's disease ([place])   . Elevated transaminase level   . Hypertension   . Bronchitis   . Cough 01/28/2014    upper airway cough syndrome  . Diverticulitis   . Tobacco abuse   . Obesity   . GERD (gastroesophageal reflux disease)   . Pneumonia 1/15    Past Surgical History  Procedure Laterality Date  . Hemocolectomy  2005    right  . Abdominal hysterectomy  1980  . Appendectomy    . Colonoscopy    . Esophagogastroduodenoscopy  06/16/2012    Procedure: ESOPHAGOGASTRODUODENOSCOPY (EGD);  Surgeon: Rogene Houston, MD;  [facility];  Service: Endoscopy;  Laterality: N/A;  10:30 AM  . Colonoscopy N/A 08/16/2014    Procedure: COLONOSCOPY;  Surgeon: Rogene Houston, MD;  [facility];  Service: Endoscopy;  Laterality: N/A;  1200-rescheduled to 9/4 @ 10:45 Ann notified pt  . Laparoscopic sigmoid colectomy N/A 01/07/2015    Procedure: LOW ANTERIOR COLON RESECTION;  Surgeon: Fanny Skates, MD;  Location: [place];  Service: General;  Laterality: N/A;  . Shoulder arthroscopy with labral repair Right 01/20/2016    Procedure: SHOULDER ARTHROSCOPY WITH LABRAL REPAIR;  Surgeon: Meredith Pel, MD;  Location: [place];  Service: Orthopedics;  Laterality: Right;    There were no vitals  filed for this visit.      Subjective Assessment - 03/22/16 1119    Subjective Patient reported shoulder being very sore today, she reported using arm for ADL's (cleaning house and mopping)   Pertinent History HTN, Crohns Disease   Diagnostic tests mri, xray; results: mod RC tendinopathy/tendinosis, shallow tears of supraspinatus (no full thickness tears); ant/inf labral tear; mod GH degenerative changes   Patient Stated Goals Get back to work.   Currently in Pain? Yes   Pain Score 5    Pain Location Shoulder   Pain Orientation Right   Pain Descriptors / Indicators Sore   Pain Type Surgical pain   Pain Onset More than a month ago   Pain Frequency Intermittent   Aggravating Factors  movement   Pain Relieving Factors rest            [facility] PT Assessment - 03/22/16 0001    AROM   AROM Assessment Site Shoulder   Right Shoulder Flexion 118 Degrees   PROM   Right/Left Shoulder Right   Right Shoulder Flexion 142 Degrees   Right Shoulder External Rotation 53 Degrees                     [facility] Adult PT Treatment/Exercise - 03/22/16 0001    Shoulder Exercises: Standing   External Rotation Strengthening;Right;Theraband  3x10 small range   Theraband Level (Shoulder External  Rotation) Level 1 (Yellow)   Internal Rotation Strengthening;Right;Theraband  3 x 10 small range   Theraband Level (Shoulder Internal Rotation) Level 1 (Yellow)   Other Standing Exercises 4 way isometrics 10sec x 10 each way   Shoulder Exercises: Pulleys   Flexion 3 minutes   Other Pulley Exercises UE ranger for elevation 3x10   Manual Therapy   Manual Therapy Passive ROM   Passive ROM Gentle slow PROM to right shoulder flexion/ER                   PT Short Term Goals - 03/08/16 1140    PT SHORT TERM GOAL #1   Title I with inital HEP   Time 2   Period Weeks   Status Achieved   PT SHORT TERM GOAL #2   Title decrease pain in R shoulder by 25% with ADLS.   Time 2   Period Weeks    Status Achieved  03/08/16            PT Long Term Goals - 03/22/16 1142    PT LONG TERM GOAL #1   Title Able to perform ADLs including work requirements with R shoulder pain 2/10 or less.   Time 4   Period Weeks   Status On-going  patient has not returned to work 03/22/16   PT LONG TERM GOAL #2   Title Demo active R shoulder flex to 165 deg to improve function.   Time 4   Period Weeks   Status On-going  AROM 118 degrees 03/22/16   PT LONG TERM GOAL #3   Title Demo 5/5 R shoulder strength   Time 4   Period Weeks   Status On-going               Plan - 03/22/16 1157    Clinical Impression Statement Patient slowly progressing due to high pain level. Patient has improved ROM yet reports pain with movements. Patient reported doing HEP daily. Reviewed HEP and activities with patient per protocol and required cues for technique. Goals ongoing due to pain, ROM and pain deficits.   Rehab Potential Excellent   PT Frequency 3x / week   PT Duration 4 weeks   PT Treatment/Interventions ADLs/Self Care Home Management;Electrical Stimulation;Cryotherapy;Moist Heat;Therapeutic exercise;Therapeutic activities;Ultrasound;Neuromuscular re-education;Patient/family education;Manual techniques;Passive range of motion;Vasopneumatic Device   PT Next Visit Plan cont per patient tolerance/ New Rx for ROM and strenghening. Refer to scanned protocol for guidance. Continue STW to posterior RC and scapular mobs.   Consulted and Agree with Plan of Care Patient      Patient will benefit from skilled therapeutic intervention in order to improve the following deficits and impairments:  Pain, Decreased range of motion, Decreased activity tolerance, Decreased strength, Impaired UE functional use  Visit Diagnosis: Right shoulder pain  Stiffness of right shoulder, not elsewhere classified  Decreased range of motion of right shoulder     Problem List Patient Active Problem List   Diagnosis Date  Noted  . Diverticulitis large intestine 01/07/2015  . Diverticulitis 07/03/2014  . Hypokalemia 07/03/2014  . Tobacco abuse   . Obesity   . Upper airway cough syndrome 01/28/2014  . IBS (irritable bowel syndrome) 07/10/2013  . Acute bilateral lower abdominal pain 03/26/2013  . Tiredness 03/26/2013  . Sigmoid diverticulitis 09/12/2012  . Epigastric pain 05/22/2012  . Nausea and vomiting 01/28/2012  . Crohn's disease of ileum ([place]) 01/10/2012  . GERD (gastroesophageal reflux disease) 01/10/2012  . HTN (hypertension) 01/10/2012  Phillips Climes, PTA 03/22/2016, 12:06 PM  [facility] [address] [place], [place], [address] Phone: [phone]   Fax:  [phone]  Name: Elizabeth [name] MRN: [id number] Date of Birth: [birth year]

## 2016-03-24 ENCOUNTER — Encounter: Payer: Self-pay | Admitting: Physical Therapy

## 2016-03-24 ENCOUNTER — Ambulatory Visit: Payer: Worker's Compensation | Admitting: Physical Therapy

## 2016-03-24 DIAGNOSIS — M25511 Pain in right shoulder: Secondary | ICD-10-CM

## 2016-03-24 DIAGNOSIS — M25611 Stiffness of right shoulder, not elsewhere classified: Secondary | ICD-10-CM

## 2016-03-24 NOTE — Therapy (Signed)
[facility] [place], [place], [address] Phone: [phone]   Fax:  [phone]  Physical Therapy Treatment  Patient Details  Name: Elizabeth [name] MRN: [id number] Date of Birth: 10/26/1961 Referring Provider: Meredith Pel MD.  Encounter Date: 03/24/2016      PT End of Session - 03/24/16 1158    Visit Number 12   Number of Visits 17   Date for PT Re-Evaluation 04/01/16   PT Start Time 1114   PT Stop Time 1157   PT Time Calculation (min) 43 min   Activity Tolerance Patient tolerated treatment well;Patient limited by pain   Behavior During Therapy [facility] for tasks assessed/performed      Past Medical History  Diagnosis Date  . Crohn's disease ([place])   . Elevated transaminase level   . Hypertension   . Bronchitis   . Cough 01/28/2014    upper airway cough syndrome  . Diverticulitis   . Tobacco abuse   . Obesity   . GERD (gastroesophageal reflux disease)   . Pneumonia 1/15    Past Surgical History  Procedure Laterality Date  . Hemocolectomy  2005    right  . Abdominal hysterectomy  1980  . Appendectomy    . Colonoscopy    . Esophagogastroduodenoscopy  06/16/2012    Procedure: ESOPHAGOGASTRODUODENOSCOPY (EGD);  Surgeon: Rogene Houston, MD;  [facility];  Service: Endoscopy;  Laterality: N/A;  10:30 AM  . Colonoscopy N/A 08/16/2014    Procedure: COLONOSCOPY;  Surgeon: Rogene Houston, MD;  [facility];  Service: Endoscopy;  Laterality: N/A;  1200-rescheduled to 9/4 @ 10:45 Ann notified pt  . Laparoscopic sigmoid colectomy N/A 01/07/2015    Procedure: LOW ANTERIOR COLON RESECTION;  Surgeon: Fanny Skates, MD;  Location: [place];  Service: General;  Laterality: N/A;  . Shoulder arthroscopy with labral repair Right 01/20/2016    Procedure: SHOULDER ARTHROSCOPY WITH LABRAL REPAIR;  Surgeon: Meredith Pel, MD;  Location: [place];  Service: Orthopedics;  Laterality: Right;    There were no vitals  filed for this visit.      Subjective Assessment - 03/24/16 1133    Subjective Patient reported soreness after chopping up chicken and doing house hold activities   Pertinent History HTN, Crohns Disease   Diagnostic tests mri, xray; results: mod RC tendinopathy/tendinosis, shallow tears of supraspinatus (no full thickness tears); ant/inf labral tear; mod GH degenerative changes   Patient Stated Goals Get back to work.   Currently in Pain? Yes   Pain Score 4    Pain Location Shoulder   Pain Orientation Right   Pain Descriptors / Indicators Sore   Pain Type Surgical pain   Pain Onset More than a month ago   Pain Frequency Intermittent   Aggravating Factors  movement and use of shoulder   Pain Relieving Factors rest                         [facility] Adult PT Treatment/Exercise - 03/24/16 0001    Shoulder Exercises: Standing   External Rotation Strengthening;Right;Theraband  small range 3x10   Theraband Level (Shoulder External Rotation) Level 1 (Yellow)   Internal Rotation Strengthening;Right;Theraband  small range 3x10   Theraband Level (Shoulder Internal Rotation) Level 1 (Yellow)   Other Standing Exercises small wall push up x10   Other Standing Exercises small wall circles 2x10 each way   Shoulder Exercises: Pulleys   Flexion --  43mn   Other Pulley Exercises UE ranger for elevation 3x10   Manual Therapy   Manual Therapy Passive ROM   Passive ROM Gentle slow PROM to right shoulder flexion/ER                   PT Short Term Goals - 03/08/16 1140    PT SHORT TERM GOAL #1   Title I with inital HEP   Time 2   Period Weeks   Status Achieved   PT SHORT TERM GOAL #2   Title decrease pain in R shoulder by 25% with ADLS.   Time 2   Period Weeks   Status Achieved  03/08/16            PT Long Term Goals - 03/22/16 1142    PT LONG TERM GOAL #1   Title Able to perform ADLs including work requirements with R shoulder pain 2/10 or less.   Time 4    Period Weeks   Status On-going  patient has not returned to work 03/22/16   PT LONG TERM GOAL #2   Title Demo active R shoulder flex to 165 deg to improve function.   Time 4   Period Weeks   Status On-going  AROM 118 degrees 03/22/16   PT LONG TERM GOAL #3   Title Demo 5/5 R shoulder strength   Time 4   Period Weeks   Status On-going               Plan - 03/24/16 1159    Clinical Impression Statement Patient progressing with all activities slowly due to pain limitations. Patient continues to use arm for ADL's and thus causing pain in shoulder. Educated patient on protocol limitations to avoid re-injury. Patient able to progress with gentle strengthening with cues for slow range and pace. Patient goals ongoing due ti pain and strength deficits.   Rehab Potential Excellent   Clinical Impairments Affecting Rehab Potential 01/20/16 surgery current 9 weeks 03/23/16   PT Frequency 3x / week   PT Duration 4 weeks   PT Treatment/Interventions ADLs/Self Care Home Management;Electrical Stimulation;Cryotherapy;Moist Heat;Therapeutic exercise;Therapeutic activities;Ultrasound;Neuromuscular re-education;Patient/family education;Manual techniques;Passive range of motion;Vasopneumatic Device   PT Next Visit Plan cont per patient tolerance/ New Rx for ROM and strenghening. Refer to scanned protocol for guidance. Continue STW to posterior RC and scapular mobs. /MD.Marlou Sa4/19/17   Consulted and Agree with Plan of Care Patient      Patient will benefit from skilled therapeutic intervention in order to improve the following deficits and impairments:  Pain, Decreased range of motion, Decreased activity tolerance, Decreased strength, Impaired UE functional use  Visit Diagnosis: Right shoulder pain  Stiffness of right shoulder, not elsewhere classified     Problem List Patient Active Problem List   Diagnosis Date Noted  . Diverticulitis large intestine 01/07/2015  . Diverticulitis 07/03/2014   . Hypokalemia 07/03/2014  . Tobacco abuse   . Obesity   . Upper airway cough syndrome 01/28/2014  . IBS (irritable bowel syndrome) 07/10/2013  . Acute bilateral lower abdominal pain 03/26/2013  . Tiredness 03/26/2013  . Sigmoid diverticulitis 09/12/2012  . Epigastric pain 05/22/2012  . Nausea and vomiting 01/28/2012  . Cro[place]'s [place]um [place] 01/10/2012  . GERD (gastroesophageal reflux disease) 01/10/2012  . HTN (hypertension) 01/10/2012    Jadence Kinlaw P, PTA 03/24/2016, 12:06 PM  [facility] [address]Phone: [phone]  Fax:  [phone] Name:  Elizabeth [name] MRN: [id number] Date of Birth: [birth year]

## 2016-03-29 ENCOUNTER — Encounter: Payer: Self-pay | Admitting: Physical Therapy

## 2016-03-29 ENCOUNTER — Ambulatory Visit: Payer: Worker's Compensation | Admitting: Physical Therapy

## 2016-03-29 DIAGNOSIS — M25611 Stiffness of right shoulder, not elsewhere classified: Secondary | ICD-10-CM

## 2016-03-29 DIAGNOSIS — M25511 Pain in right shoulder: Secondary | ICD-10-CM | POA: Diagnosis not present

## 2016-03-29 NOTE — Therapy (Signed)
[facility] [place], [place], [address] Phone: [phone]   Fax:  [phone]  Physical Therapy Treatment  Patient Details  Name: Elizabeth [name] MRN: [id number] Date of Birth: Sep 03, 1961 Referring Provider: Meredith Pel MD.  Encounter Date: 03/29/2016      PT End of Session - 03/29/16 1119    Visit Number 13   Number of Visits 17   Date for PT Re-Evaluation 04/01/16   PT Start Time 1116   PT Stop Time 1156   PT Time Calculation (min) 40 min   Activity Tolerance Patient tolerated treatment well;Patient limited by pain   Behavior During Therapy [facility] for tasks assessed/performed      Past Medical History  Diagnosis Date  . Crohn's disease ([place])   . Elevated transaminase level   . Hypertension   . Bronchitis   . Cough 01/28/2014    upper airway cough syndrome  . Diverticulitis   . Tobacco abuse   . Obesity   . GERD (gastroesophageal reflux disease)   . Pneumonia 1/15    Past Surgical History  Procedure Laterality Date  . Hemocolectomy  2005    right  . Abdominal hysterectomy  1980  . Appendectomy    . Colonoscopy    . Esophagogastroduodenoscopy  06/16/2012    Procedure: ESOPHAGOGASTRODUODENOSCOPY (EGD);  Surgeon: Rogene Houston, MD;  [facility];  Service: Endoscopy;  Laterality: N/A;  10:30 AM  . Colonoscopy N/A 08/16/2014    Procedure: COLONOSCOPY;  Surgeon: Rogene Houston, MD;  [facility];  Service: Endoscopy;  Laterality: N/A;  1200-rescheduled to 9/4 @ 10:45 Ann notified pt  . Laparoscopic sigmoid colectomy N/A 01/07/2015    Procedure: LOW ANTERIOR COLON RESECTION;  Surgeon: Fanny Skates, MD;  Location: [place];  Service: General;  Laterality: N/A;  . Shoulder arthroscopy with labral repair Right 01/20/2016    Procedure: SHOULDER ARTHROSCOPY WITH LABRAL REPAIR;  Surgeon: Meredith Pel, MD;  Location: [place];  Service: Orthopedics;  Laterality: Right;    There were no vitals  filed for this visit.      Subjective Assessment - 03/29/16 1119    Subjective States that pulleys makes her elbow feel like its going to give out.   Pertinent History HTN, Crohns Disease   Diagnostic tests mri, xray; results: mod RC tendinopathy/tendinosis, shallow tears of supraspinatus (no full thickness tears); ant/inf labral tear; mod GH degenerative changes   Patient Stated Goals Get back to work.   Currently in Pain? Yes   Pain Score 4    Pain Location Shoulder   Pain Orientation Right   Pain Descriptors / Indicators Sore   Pain Type Surgical pain   Pain Onset More than a month ago            [facility] PT Assessment - 03/29/16 0001    Assessment   Medical Diagnosis Right shoulder pain.   Onset Date/Surgical Date 01/20/16   Hand Dominance Right   Next MD Visit 03/31/16   ROM / Strength   AROM / PROM / Strength AROM   AROM   Overall AROM  Deficits   AROM Assessment Site Shoulder   Right/Left Shoulder Right   Right Shoulder Flexion 140 Degrees   Right Shoulder ABduction 145 Degrees  scaption   Right Shoulder Internal Rotation 60 Degrees   Right Shoulder External Rotation 37 Degrees  [facility] Adult PT Treatment/Exercise - 03/29/16 0001    Shoulder Exercises: Supine   Flexion AROM;Both;10 reps   Other Supine Exercises R shoulder D1/D2 AROM x12 reps each   Shoulder Exercises: Prone   Retraction AROM;Right;20 reps   Extension AROM;Right;20 reps   Shoulder Exercises: Standing   External Rotation Strengthening;Right;Theraband  3x10 reps   Theraband Level (Shoulder External Rotation) Level 1 (Yellow)   Internal Rotation Strengthening;Right;Theraband  3x10 reps   Theraband Level (Shoulder Internal Rotation) Level 1 (Yellow)   ABduction AROM;Right;20 reps   Other Standing Exercises small wall push up x15   Other Standing Exercises small wall circles 2x10 each way   Shoulder Exercises: Pulleys   Flexion Other (comment)  x5 min   Other  Pulley Exercises RUE wall ladder x2 min   Manual Therapy   Manual Therapy Passive ROM;Soft tissue mobilization   Soft tissue mobilization STW to R Deltoids, Bicep region to decrease tightness and pain in supine   Passive ROM Gentle slow PROM to right shoulder flex/scap/ER/IR with gentle holds at end range                  PT Short Term Goals - 03/08/16 1140    PT SHORT TERM GOAL #1   Title I with inital HEP   Time 2   Period Weeks   Status Achieved   PT SHORT TERM GOAL #2   Title decrease pain in R shoulder by 25% with ADLS.   Time 2   Period Weeks   Status Achieved  03/08/16            PT Long Term Goals - 03/29/16 1208    PT LONG TERM GOAL #1   Title Able to perform ADLs including work requirements with R shoulder pain 2/10 or less.   Time 4   Period Weeks   Status On-going  patient has not returned to work 03/22/16   PT LONG TERM GOAL #2   Title Demo active R shoulder flex to 165 deg to improve function.   Time 4   Period Weeks   Status On-going  AROM R shoulder flex 140 deg in supine 03/29/2016   PT LONG TERM GOAL #3   Title Demo 5/5 R shoulder strength   Time 4   Period Weeks   Status On-going               Plan - 03/29/16 1209    Clinical Impression Statement Patient tolerated today's treatment fairly well as she arrived with tightness and soreness in R shoulder. Patient tolerated today's therapeutic exercises fairly well as fatigue and weakness were demonstrated but also patient experienced R shoulder discomfort. Patient continues to report difficulty with ADLs and attending to her hair. Patient's AROM of R shoulder in supine has progressed regarding flexion and scaption alhought ER and IR were limited today. AROM of R shoulder in supine was measured as 140 deg flex, 145 deg scaption, 37 deg ER, 60 deg IR. Tightness was noted in R deltoids and bicep region today and patient reports that that area becomes warm at times. [place]mmoth arc of motion and firm  end feels were noted with each direction of AROM of R shoulder today.   Rehab Potential Excellent   Clinical Impairments Affecting Rehab Potential 01/20/16 surgery current 9 weeks 03/23/16   PT Frequency 3x / week   PT Duration 4 weeks   PT Treatment/Interventions ADLs/Self Care Home Management;Electrical Stimulation;Cryotherapy;Moist Heat;Therapeutic exercise;Therapeutic activities;Ultrasound;Neuromuscular re-education;Patient/family education;Manual techniques;Passive range  of motion;Vasopneumatic Device   PT Next Visit Plan cont per patient tolerance/ New Rx for ROM and strenghening. Refer to scanned protocol for guidance. Continue STW to posterior RC and scapular mobs. /MDMarlou Sa 03/31/16   PT Home Exercise Plan shoulder isometrics   Consulted and Agree with Plan of Care Patient      Patient will benefit from skilled therapeutic intervention in order to improve the following deficits and impairments:  Pain, Decreased range of motion, Decreased activity tolerance, Decreased strength, Impaired UE functional use  Visit Diagnosis: Right shoulder pain  Stiffness of right shoulder, not elsewhere classified     Problem List Patient Active Problem List   Diagnosis Date Noted  . Diverticulitis large intestine 01/07/2015  . Diverticulitis 07/03/2014  . Hypokalemia 07/03/2014  . Tobacco abuse   . Obesity   . Upper airway cough syndrome 01/28/2014  . IBS (irritable bowel syndrome) 07/10/2013  . Acute bilateral lower abdominal pain 03/26/2013  . Tiredness 03/26/2013  . Sigmoid diverticulitis 09/12/2012  . Epigastric pain 05/22/2012  . Nausea and vomiting 01/28/2012  . Crohn's disease of ileum ([place]) 01/10/2012  . GERD (gastroesophageal reflux disease) 01/10/2012  . HTN (hypertension) 01/10/2012    Ahmed Prima, PTA 03/29/2016 1:05 PM Mali Applegate MPT [facility] [address] [place], [place], [address] Phone: [phone]   Fax:   [phone]  Name: SANA TESSMER MRN: [id number] Date of Birth: 1961/04/28

## 2016-04-01 ENCOUNTER — Encounter: Payer: Self-pay | Admitting: Physical Therapy

## 2016-04-01 ENCOUNTER — Ambulatory Visit: Payer: Worker's Compensation | Admitting: Physical Therapy

## 2016-04-01 DIAGNOSIS — M25611 Stiffness of right shoulder, not elsewhere classified: Secondary | ICD-10-CM

## 2016-04-01 DIAGNOSIS — M25511 Pain in right shoulder: Secondary | ICD-10-CM

## 2016-04-01 NOTE — Therapy (Signed)
[facility] [place], [place], [address] Phone: [phone]   Fax:  [phone]  Physical Therapy Treatment  Patient Details  Name: Elizabeth [name] MRN: [id number] Date of Birth: 1961-06-03 Referring Provider: Meredith Pel MD.  Encounter Date: 04/01/2016      PT End of Session - 04/01/16 1156    Visit Number 14   Number of Visits 17   Date for PT Re-Evaluation 05/12/16  per MD   PT Start Time 1114   PT Stop Time 1157   PT Time Calculation (min) 43 min   Activity Tolerance Patient tolerated treatment well   Behavior During Therapy [facility] for tasks assessed/performed      Past Medical History  Diagnosis Date  . Crohn's disease ([place])   . Elevated transaminase level   . Hypertension   . Bronchitis   . Cough 01/28/2014    upper airway cough syndrome  . Diverticulitis   . Tobacco abuse   . Obesity   . GERD (gastroesophageal reflux disease)   . Pneumonia 1/15    Past Surgical History  Procedure Laterality Date  . Hemocolectomy  2005    right  . Abdominal hysterectomy  1980  . Appendectomy    . Colonoscopy    . Esophagogastroduodenoscopy  06/16/2012    Procedure: ESOPHAGOGASTRODUODENOSCOPY (EGD);  Surgeon: Rogene Houston, MD;  [facility];  Service: Endoscopy;  Laterality: N/A;  10:30 AM  . Colonoscopy N/A 08/16/2014    Procedure: COLONOSCOPY;  Surgeon: Rogene Houston, MD;  [facility];  Service: Endoscopy;  Laterality: N/A;  1200-rescheduled to 9/4 @ 10:45 Ann notified pt  . Laparoscopic sigmoid colectomy N/A 01/07/2015    Procedure: LOW ANTERIOR COLON RESECTION;  Surgeon: Fanny Skates, MD;  Location: [place];  Service: General;  Laterality: N/A;  . Shoulder arthroscopy with labral repair Right 01/20/2016    Procedure: SHOULDER ARTHROSCOPY WITH LABRAL REPAIR;  Surgeon: Meredith Pel, MD;  Location: [place];  Service: Orthopedics;  Laterality: Right;    There were no vitals filed for this  visit.      Subjective Assessment - 04/01/16 1123    Subjective New order per MD to start strengthening below shoulder level and 3xweek for 6 weeks   Pertinent History HTN, Crohns Disease   Diagnostic tests mri, xray; results: mod RC tendinopathy/tendinosis, shallow tears of supraspinatus (no full thickness tears); ant/inf labral tear; mod GH degenerative changes   Patient Stated Goals Get back to work.   Currently in Pain? Yes   Pain Score 4    Pain Location Shoulder   Pain Orientation Right   Pain Descriptors / Indicators Sore   Pain Type Surgical pain   Pain Onset More than a month ago   Pain Frequency Intermittent   Aggravating Factors  use of shoulder   Pain Relieving Factors at rest            [facility] PT Assessment - 04/01/16 0001    AROM   Overall AROM  Deficits   AROM Assessment Site Shoulder   Right/Left Shoulder Right   Right Shoulder Flexion 141 Degrees   PROM   Right/Left Shoulder Right   Right Shoulder Flexion 145 Degrees   Right Shoulder External Rotation 65 Degrees                     [facility] Adult PT Treatment/Exercise - 04/01/16 0001    Shoulder Exercises: Prone   Retraction  Strengthening;Right;Weights  2x10   Retraction Weight (lbs) 1   Extension Strengthening;Right;Weights  2x10   Extension Weight (lbs) 1   Shoulder Exercises: Standing   External Rotation Strengthening;Right;Theraband  3x10   Theraband Level (Shoulder External Rotation) Level 1 (Yellow)   Internal Rotation Strengthening;Right;Theraband  3x10   Theraband Level (Shoulder Internal Rotation) Level 1 (Yellow)   Flexion Strengthening;Right;Theraband  3x10   Theraband Level (Shoulder Flexion) Level 1 (Yellow)   Extension Strengthening;Right;Theraband  3x10   Theraband Level (Shoulder Extension) Level 1 (Yellow)   Other Standing Exercises small wall push up 2x10   Other Standing Exercises small wall circles 2x10 each way   Shoulder Exercises: Pulleys   Flexion --  45mn    Manual Therapy   Manual Therapy Passive ROM   Passive ROM Gentle slow PROM to right shoulder flex/scap/ER with gentle holds at end range                  PT Short Term Goals - 03/08/16 1140    PT SHORT TERM GOAL #1   Title I with inital HEP   Time 2   Period Weeks   Status Achieved   PT SHORT TERM GOAL #2   Title decrease pain in R shoulder by 25% with ADLS.   Time 2   Period Weeks   Status Achieved  03/08/16            PT Long Term Goals - 03/29/16 1208    PT LONG TERM GOAL #1   Title Able to perform ADLs including work requirements with R shoulder pain 2/10 or less.   Time 4   Period Weeks   Status On-going  patient has not returned to work 03/22/16   PT LONG TERM GOAL #2   Title Demo active R shoulder flex to 165 deg to improve function.   Time 4   Period Weeks   Status On-going  AROM R shoulder flex 140 deg in supine 03/29/2016   PT LONG TERM GOAL #3   Title Demo 5/5 R shoulder strength   Time 4   Period Weeks   Status On-going               Plan - 04/01/16 1158    Clinical Impression Statement Patient progressing with activities today. patient able to progress with strengthening with no complaints of pain. patient improved ROM in right shoulder. Patient goals ongoing due to strength and ROM deficits   Rehab Potential Excellent   Clinical Impairments Affecting Rehab Potential 01/20/16 surgery current 9 weeks 03/23/16   PT Frequency 3x / week   PT Duration 4 weeks   PT Treatment/Interventions ADLs/Self Care Home Management;Electrical Stimulation;Cryotherapy;Moist Heat;Therapeutic exercise;Therapeutic activities;Ultrasound;Neuromuscular re-education;Patient/family education;Manual techniques;Passive range of motion;Vasopneumatic Device   PT Next Visit Plan cont per patient tolerance/ New Rx for strenghening. Refer to scanned protocol for guidance. Continue STW to posterior RC and scapular mobs   Consulted and Agree with Plan of Care Patient       Patient will benefit from skilled therapeutic intervention in order to improve the following deficits and impairments:  Pain, Decreased range of motion, Decreased activity tolerance, Decreased strength, Impaired UE functional use  Visit Diagnosis: Right shoulder pain  Stiffness of right shoulder, not elsewhere classified  Decreased range of motion of right shoulder     Problem List Patient Active Problem List   Diagnosis Date Noted  . Diverticulitis large intestine 01/07/2015  . Diverticulitis 07/03/2014  . Hypokalemia 07/03/2014  .  Tobacco abuse   . Obesity   . Upper airway cough syndrome 01/28/2014  . IBS (irritable bowel syndrome) 07/10/2013  . Acute bilateral lower abdominal pain 03/26/2013  . Tiredness 03/26/2013  . Sigmoid diverticulitis 09/12/2012  . Epigastric pain 05/22/2012  . Nausea and vomiting 01/28/2012  . Crohn's disease of ileum ([place]) 01/10/2012  . GERD (gastroesophageal reflux disease) 01/10/2012  . HTN (hypertension) 01/10/2012    Ramello Cordial P, PTA 04/01/2016, 12:03 PM  [facility] [place], [place], [address] Phone: [phone]   Fax:  [phone]  Name: ALVERTA CACCAMO MRN: [id number] Date of Birth: 30-Sep-1961

## 2016-04-02 ENCOUNTER — Ambulatory Visit: Payer: Worker's Compensation | Admitting: Physical Therapy

## 2016-04-02 ENCOUNTER — Encounter: Payer: Self-pay | Admitting: Physical Therapy

## 2016-04-02 DIAGNOSIS — M25511 Pain in right shoulder: Secondary | ICD-10-CM

## 2016-04-02 DIAGNOSIS — M25611 Stiffness of right shoulder, not elsewhere classified: Secondary | ICD-10-CM

## 2016-04-02 NOTE — Therapy (Signed)
[facility] [place], [place], [address] Phone: [phone]   Fax:  [phone]  Physical Therapy Treatment  Patient Details  Name: Elizabeth [name] MRN: [id number] Date of Birth: 21-Sep-1961 Referring Provider: Meredith Pel MD.  Encounter Date: 04/02/2016      PT End of Session - 04/02/16 1112    Visit Number 15   Number of Visits 17   Date for PT Re-Evaluation 05/12/16   PT Start Time 1115   PT Stop Time 1157   PT Time Calculation (min) 42 min   Activity Tolerance Patient tolerated treatment well   Behavior During Therapy [facility] for tasks assessed/performed      Past Medical History  Diagnosis Date  . Crohn's disease ([place])   . Elevated transaminase level   . Hypertension   . Bronchitis   . Cough 01/28/2014    upper airway cough syndrome  . Diverticulitis   . Tobacco abuse   . Obesity   . GERD (gastroesophageal reflux disease)   . Pneumonia 1/15    Past Surgical History  Procedure Laterality Date  . Hemocolectomy  2005    right  . Abdominal hysterectomy  1980  . Appendectomy    . Colonoscopy    . Esophagogastroduodenoscopy  06/16/2012    Procedure: ESOPHAGOGASTRODUODENOSCOPY (EGD);  Surgeon: Rogene Houston, MD;  [facility];  Service: Endoscopy;  Laterality: N/A;  10:30 AM  . Colonoscopy N/A 08/16/2014    Procedure: COLONOSCOPY;  Surgeon: Rogene Houston, MD;  [facility];  Service: Endoscopy;  Laterality: N/A;  1200-rescheduled to 9/4 @ 10:45 Ann notified pt  . Laparoscopic sigmoid colectomy N/A 01/07/2015    Procedure: LOW ANTERIOR COLON RESECTION;  Surgeon: Fanny Skates, MD;  Location: [place];  Service: General;  Laterality: N/A;  . Shoulder arthroscopy with labral repair Right 01/20/2016    Procedure: SHOULDER ARTHROSCOPY WITH LABRAL REPAIR;  Surgeon: Meredith Pel, MD;  Location: [place];  Service: Orthopedics;  Laterality: Right;    There were no vitals filed for this  visit.      Subjective Assessment - 04/02/16 1119    Subjective States that shoulder feels alright and feels soreness in R Bicep region at times. States that she has been working her arms with weights at home.   Pertinent History HTN, Crohns Disease   Diagnostic tests mri, xray; results: mod RC tendinopathy/tendinosis, shallow tears of supraspinatus (no full thickness tears); ant/inf labral tear; mod GH degenerative changes   Patient Stated Goals Get back to work.   Currently in Pain? No/denies            [facility] PT Assessment - 04/02/16 0001    Assessment   Medical Diagnosis Right shoulder pain.   Onset Date/Surgical Date 01/20/16   Hand Dominance Right   Next MD Visit 04/25/2016                     [facility] Adult PT Treatment/Exercise - 04/02/16 0001    Shoulder Exercises: Supine   Protraction Strengthening;Right;20 reps;Weights   Protraction Weight (lbs) 1   External Rotation AROM;Right;15 reps  Reported a needle like sensation and exercise was stopped   Flexion AROM;Both  3x10 reps   Shoulder Exercises: Prone   Retraction Strengthening;Right;Weights  3x10 reps   Retraction Weight (lbs) 3   Extension Strengthening;Right;Weights  3x10 reps   Extension Weight (lbs) 3   Shoulder Exercises: Standing   Protraction Strengthening;Right;Theraband  3x10reps  Theraband Level (Shoulder Protraction) Level 1 (Yellow)   External Rotation Strengthening;Right;Theraband  3x10 reps   Theraband Level (Shoulder External Rotation) Level 1 (Yellow)   Internal Rotation Strengthening;Right;Theraband  3x10 reps   Theraband Level (Shoulder Internal Rotation) Level 1 (Yellow)   Extension Strengthening;Right;Theraband  3x10 reps   Theraband Level (Shoulder Extension) Level 1 (Yellow)   Row Strengthening;Right;Theraband  3x10 reps   Theraband Level (Shoulder Row) Level 1 (Yellow)   Shoulder Exercises: Pulleys   Flexion Other (comment)  x5 min   Other Pulley Exercises RUE  ranger flex/circles x30 reps   Shoulder Exercises: ROM/Strengthening   UBE (Upper Arm Bike) 120 RPM x6 min (3 min forward, 3 min backward)   Proximal Shoulder Strengthening, Supine R shoulder rhythmic stabs in ER/flex in supine x9 min with intermittant oscillations to promote relaxation   Other ROM/Strengthening Exercises Supine RUE ABCs x1 rep                  PT Short Term Goals - 03/08/16 1140    PT SHORT TERM GOAL #1   Title I with inital HEP   Time 2   Period Weeks   Status Achieved   PT SHORT TERM GOAL #2   Title decrease pain in R shoulder by 25% with ADLS.   Time 2   Period Weeks   Status Achieved  03/08/16            PT Long Term Goals - 03/29/16 1208    PT LONG TERM GOAL #1   Title Able to perform ADLs including work requirements with R shoulder pain 2/10 or less.   Time 4   Period Weeks   Status On-going  patient has not returned to work 03/22/16   PT LONG TERM GOAL #2   Title Demo active R shoulder flex to 165 deg to improve function.   Time 4   Period Weeks   Status On-going  AROM R shoulder flex 140 deg in supine 03/29/2016   PT LONG TERM GOAL #3   Title Demo 5/5 R shoulder strength   Time 4   Period Weeks   Status On-going               Plan - 04/02/16 1200    Clinical Impression Statement Patient tolerated today's treatment fairly well as she is eager to progress with strengthening and was warned to be careful and to not exceed the max pound given by MD. Continues to tolerate theraband strengthening well with no reports of pain only clicking in R shoulder. Demonstrated weakness of serratus anterior with supine serratus punches that was initated with 1#. Patient experienced needle like sensation in R shoulder with supine AROM ER and exercise was stopped at that time. Patient was educated regarding the importance of stabilization exercises for shoulder and demonstrated good stability of R shoulder with both ABCs and rhythmic stabilizations  in ER and flexion. Goals remain on-going at this time as patient has not yet returned to work and continues to have ROM and strength deficits.   Rehab Potential Excellent   Clinical Impairments Affecting Rehab Potential 01/20/16 surgery current 9 weeks 03/23/16   PT Frequency 3x / week   PT Duration 4 weeks   PT Treatment/Interventions ADLs/Self Care Home Management;Electrical Stimulation;Cryotherapy;Moist Heat;Therapeutic exercise;Therapeutic activities;Ultrasound;Neuromuscular re-education;Patient/family education;Manual techniques;Passive range of motion;Vasopneumatic Device   PT Next Visit Plan cont per patient tolerance/ New Rx for strenghening. Refer to scanned protocol for guidance. Continue STW to posterior RC and scapular  mobs   PT Home Exercise Plan shoulder isometrics   Consulted and Agree with Plan of Care Patient      Patient will benefit from skilled therapeutic intervention in order to improve the following deficits and impairments:  Pain, Decreased range of motion, Decreased activity tolerance, Decreased strength, Impaired UE functional use  Visit Diagnosis: Right shoulder pain  Stiffness of right shoulder, not elsewhere classified     Problem List Patient Active Problem List   Diagnosis Date Noted  . Diverticulitis large intestine 01/07/2015  . Diverticulitis 07/03/2014  . Hypokalemia 07/03/2014  . Tobacco abuse   . Obesity   . Upper airway cough syndrome 01/28/2014  . IBS (irritable bowel syndrome) 07/10/2013  . Acute bilateral lower abdominal pain 03/26/2013  . Tiredness 03/26/2013  . Sigmoid diverticulitis 09/12/2012  . Epigastric pain 05/22/2012  . Nausea and vomiting 01/28/2012  . Crohn's disease of ileum ([place]) 01/10/2012  . GERD (gastroesophageal reflux disease) 01/10/2012  . HTN (hypertension) 01/10/2012    Wynelle Fanny, PTA 04/02/2016, 12:09 PM  [facility] [address] [place], [place],  [address] Phone: [phone]   Fax:  [phone]  Name: SARYN CHERRY MRN: [id number] Date of Birth: 26-Aug-1961

## 2016-04-05 ENCOUNTER — Ambulatory Visit: Payer: Worker's Compensation | Admitting: Physical Therapy

## 2016-04-05 ENCOUNTER — Encounter: Payer: Self-pay | Admitting: Physical Therapy

## 2016-04-05 DIAGNOSIS — M25611 Stiffness of right shoulder, not elsewhere classified: Secondary | ICD-10-CM

## 2016-04-05 DIAGNOSIS — M25511 Pain in right shoulder: Secondary | ICD-10-CM | POA: Diagnosis not present

## 2016-04-05 NOTE — Therapy (Signed)
[facility] [place], [place], [address] Phone: [phone]   Fax:  [phone]  Physical Therapy Treatment  Patient Details  Name: Elizabeth [name] MRN: [id number] Date of Birth: 1961-11-12 Referring Provider: Meredith Pel MD.  Encounter Date: 04/05/2016      PT End of Session - 04/05/16 1102    Visit Number 16   Number of Visits 17   Date for PT Re-Evaluation 05/12/16   PT Start Time 1116   PT Stop Time 1156   PT Time Calculation (min) 40 min   Activity Tolerance Patient tolerated treatment well   Behavior During Therapy [facility] for tasks assessed/performed      Past Medical History  Diagnosis Date  . Crohn's disease ([place])   . Elevated transaminase level   . Hypertension   . Bronchitis   . Cough 01/28/2014    upper airway cough syndrome  . Diverticulitis   . Tobacco abuse   . Obesity   . GERD (gastroesophageal reflux disease)   . Pneumonia 1/15    Past Surgical History  Procedure Laterality Date  . Hemocolectomy  2005    right  . Abdominal hysterectomy  1980  . Appendectomy    . Colonoscopy    . Esophagogastroduodenoscopy  06/16/2012    Procedure: ESOPHAGOGASTRODUODENOSCOPY (EGD);  Surgeon: Rogene Houston, MD;  [facility];  Service: Endoscopy;  Laterality: N/A;  10:30 AM  . Colonoscopy N/A 08/16/2014    Procedure: COLONOSCOPY;  Surgeon: Rogene Houston, MD;  [facility];  Service: Endoscopy;  Laterality: N/A;  1200-rescheduled to 9/4 @ 10:45 Ann notified pt  . Laparoscopic sigmoid colectomy N/A 01/07/2015    Procedure: LOW ANTERIOR COLON RESECTION;  Surgeon: Fanny Skates, MD;  Location: [place];  Service: General;  Laterality: N/A;  . Shoulder arthroscopy with labral repair Right 01/20/2016    Procedure: SHOULDER ARTHROSCOPY WITH LABRAL REPAIR;  Surgeon: Meredith Pel, MD;  Location: [place];  Service: Orthopedics;  Laterality: Right;    There were no vitals filed for this  visit.      Subjective Assessment - 04/05/16 1101    Subjective States that she experienced R shoulder fatigue saturday and states that R shoulder feels tight today.    Pertinent History HTN, Crohns Disease   Diagnostic tests mri, xray; results: mod RC tendinopathy/tendinosis, shallow tears of supraspinatus (no full thickness tears); ant/inf labral tear; mod GH degenerative changes   Patient Stated Goals Get back to work.   Currently in Pain? Yes   Pain Score 2    Pain Location Shoulder   Pain Orientation Right   Pain Descriptors / Indicators Tightness   Pain Type Surgical pain   Pain Onset More than a month ago            [facility] PT Assessment - 04/05/16 0001    Assessment   Medical Diagnosis Right shoulder pain.   Onset Date/Surgical Date 01/20/16   Hand Dominance Right   Next MD Visit 04/25/2016                     [facility] Adult PT Treatment/Exercise - 04/05/16 0001    Shoulder Exercises: Supine   External Rotation AROM;Both  3x10 reps; 45 deg recline   Flexion AROM;Both  3x10 reps; 45 deg recline   Shoulder Exercises: Prone   Retraction Strengthening;Right;Weights  3x10 reps   Retraction Weight (lbs) 3   Extension Strengthening;Right;Weights  3x10 reps  Extension Weight (lbs) 3   Shoulder Exercises: Standing   Protraction Strengthening;Right;Theraband  3x 10 reps   Theraband Level (Shoulder Protraction) Level 2 (Red)   Horizontal ABduction AROM;Both  3x10 reps; elbows close to side   External Rotation Strengthening;Right;Theraband  3x10 reps   Theraband Level (Shoulder External Rotation) Level 2 (Red)   Internal Rotation Strengthening;Right;Theraband  3x10 reps   Theraband Level (Shoulder Internal Rotation) Level 2 (Red)   Extension Strengthening;Right;Theraband  3x10 reps   Theraband Level (Shoulder Extension) Level 2 (Red)   Row Strengthening;Right;Theraband  3x10 reps   Theraband Level (Shoulder Row) Level 2 (Red)   Shoulder Exercises:  Pulleys   Other Pulley Exercises RUE ranger flex/circles x30 reps   Shoulder Exercises: ROM/Strengthening   UBE (Upper Arm Bike) 120 RPM x6 min (3 min forward, 3 min backward)   Rhythmic Stabilization, Supine R shoulder rhythmic stabilizations in ER, flex to improve R shoulder stability    Other ROM/Strengthening Exercises Reclined RUE ABCs x1 rep   Other ROM/Strengthening Exercises Reclined R shoulder circles, triangles, squares x20 reps each   Manual Therapy   Manual Therapy Passive ROM   Passive ROM Gentle PROM of R shoulder into ER/Flex with intermittant oscillations to promote relaxation                  PT Short Term Goals - 03/08/16 1140    PT SHORT TERM GOAL #1   Title I with inital HEP   Time 2   Period Weeks   Status Achieved   PT SHORT TERM GOAL #2   Title decrease pain in R shoulder by 25% with ADLS.   Time 2   Period Weeks   Status Achieved  03/08/16            PT Long Term Goals - 03/29/16 1208    PT LONG TERM GOAL #1   Title Able to perform ADLs including work requirements with R shoulder pain 2/10 or less.   Time 4   Period Weeks   Status On-going  patient has not returned to work 03/22/16   PT LONG TERM GOAL #2   Title Demo active R shoulder flex to 165 deg to improve function.   Time 4   Period Weeks   Status On-going  AROM R shoulder flex 140 deg in supine 03/29/2016   PT LONG TERM GOAL #3   Title Demo 5/5 R shoulder strength   Time 4   Period Weeks   Status On-going               Plan - 04/05/16 1159    Clinical Impression Statement Patient tolerated today's treatment fairly well as she continues to experience R shoulder clicking and catching intermittantly. Able to advance theraband to red from yellow without reports of increased pain. Clicking was audibly heard as well as verbally reported during red theraband strengthening. Patient also completed new reclined ROM exercises well without reports of R shoulder pain or discomfort.  Patient completed stabilization exercises well and demonstrated good R shoulder stability with supine R shoulder rhythmic stabilizations. Firm end feels noted with PROM of R shoulder into ER and flex and required intermittant oscillations as catching was reported in anterior R shoulder. Goals remain on-going at this time secondary to having not returned to work at this time and continues to have ROM and strength deficits. Patient experienced R shoulder pain following treatment that was reported as "not that bad."   Rehab Potential Excellent  Clinical Impairments Affecting Rehab Potential 01/20/16 surgery current 9 weeks 03/23/16   PT Frequency 3x / week   PT Duration 4 weeks   PT Treatment/Interventions ADLs/Self Care Home Management;Electrical Stimulation;Cryotherapy;Moist Heat;Therapeutic exercise;Therapeutic activities;Ultrasound;Neuromuscular re-education;Patient/family education;Manual techniques;Passive range of motion;Vasopneumatic Device   PT Next Visit Plan cont per patient tolerance/ New Rx for strenghening. Refer to scanned protocol for guidance. Continue STW to posterior RC and scapular mobs   PT Home Exercise Plan shoulder isometrics   Consulted and Agree with Plan of Care Patient      Patient will benefit from skilled therapeutic intervention in order to improve the following deficits and impairments:  Pain, Decreased range of motion, Decreased activity tolerance, Decreased strength, Impaired UE functional use  Visit Diagnosis: Right shoulder pain  Stiffness of right shoulder, not elsewhere classified     Problem List Patient Active Problem List   Diagnosis Date Noted  . Diverticulitis large intestine 01/07/2015  . Diverticulitis 07/03/2014  . Hypokalemia 07/03/2014  . Tobacco abuse   . Obesity   . Upper airway cough syndrome 01/28/2014  . IBS (irritable bowel syndrome) 07/10/2013  . Acute bilateral lower abdominal pain 03/26/2013  . Tiredness 03/26/2013  . Sigmoid  diverticulitis 09/12/2012  . Epigastric pain 05/22/2012  . Nausea and vomiting 01/28/2012  . Crohn's disease of ileum ([place]) 01/10/2012  . GERD (gastroesophageal reflux disease) 01/10/2012  . HTN (hypertension) 01/10/2012    Wynelle Fanny, PTA 04/05/2016, 12:05 PM  [facility] [address] [place], [place], [address] Phone: [phone]   Fax:  [phone]  Name: Elizabeth [name] MRN: [id number] Date of Birth: 1961/05/23

## 2016-04-07 ENCOUNTER — Encounter: Payer: Self-pay | Admitting: Physical Therapy

## 2016-04-07 ENCOUNTER — Ambulatory Visit: Payer: Worker's Compensation | Admitting: Physical Therapy

## 2016-04-07 DIAGNOSIS — M25511 Pain in right shoulder: Secondary | ICD-10-CM | POA: Diagnosis not present

## 2016-04-07 DIAGNOSIS — M25611 Stiffness of right shoulder, not elsewhere classified: Secondary | ICD-10-CM

## 2016-04-07 NOTE — Therapy (Signed)
[facility] [place], [place], [address] Phone: [phone]   Fax:  [phone]  Physical Therapy Treatment  Patient Details  Name: Elizabeth [name] MRN: [id number] Date of Birth: [birth year] Referring Provider: Meredith Pel MD.  Encounter Date: 04/07/2016      PT End of Session - 04/07/16 1213    Date for PT Re-Evaluation 06/09/16      Past Medical History  Diagnosis Date  . Crohn's disease ([place])   . Elevated transaminase level   . Hypertension   . Bronchitis   . Cough 01/28/2014    upper airway cough syndrome  . Diverticulitis   . Tobacco abuse   . Obesity   . GERD (gastroesophageal reflux disease)   . Pneumonia 1/15    Past Surgical History  Procedure Laterality Date  . Hemocolectomy  2005    right  . Abdominal hysterectomy  1980  . Appendectomy    . Colonoscopy    . Esophagogastroduodenoscopy  06/16/2012    Procedure: ESOPHAGOGASTRODUODENOSCOPY (EGD);  Surgeon: Rogene Houston, MD;  [facility];  Service: Endoscopy;  Laterality: N/A;  10:30 AM  . Colonoscopy N/A 08/16/2014    Procedure: COLONOSCOPY;  Surgeon: Rogene Houston, MD;  [facility];  Service: Endoscopy;  Laterality: N/A;  1200-rescheduled to 9/4 @ 10:45 Ann notified pt  . Laparoscopic sigmoid colectomy N/A 01/07/2015    Procedure: LOW ANTERIOR COLON RESECTION;  Surgeon: Fanny Skates, MD;  Location: [place];  Service: General;  Laterality: N/A;  . Shoulder arthroscopy with labral repair Right 01/20/2016    Procedure: SHOULDER ARTHROSCOPY WITH LABRAL REPAIR;  Surgeon: Meredith Pel, MD;  Location: [place];  Service: Orthopedics;  Laterality: Right;    There were no vitals filed for this visit.      Subjective Assessment - 04/07/16 1105    Subjective States that she doesn't sleep well and she wakes a different times with her arm behind her head and moving her covers.   Pertinent History HTN, Crohns Disease   Diagnostic  tests mri, xray; results: mod RC tendinopathy/tendinosis, shallow tears of supraspinatus (no full thickness tears); ant/inf labral tear; mod GH degenerative changes   Patient Stated Goals Get back to work.   Currently in Pain? Yes   Pain Score 5    Pain Location Shoulder   Pain Orientation Right   Pain Descriptors / Indicators Sore   Pain Type Surgical pain   Pain Onset More than a month ago            [facility] PT Assessment - 04/07/16 0001    Assessment   Medical Diagnosis Right shoulder pain.   Onset Date/Surgical Date 01/20/16   Hand Dominance Right   Next MD Visit 04/25/2016                     [facility] Adult PT Treatment/Exercise - 04/07/16 0001    Shoulder Exercises: Supine   Horizontal ABduction Strengthening;Both;Theraband  3x10 reps   Theraband Level (Shoulder Horizontal ABduction) Level 2 (Red)   External Rotation AROM;Both;Limitations  Reported pain in R shoulder like a side pain while running   External Rotation Limitations x30 reps; 45 deg recline   Flexion AROM;Both  3x10 reps 45 deg recline   Shoulder Exercises: Prone   Retraction Strengthening;Right;Weights  3x10 reps   Retraction Weight (lbs) 3   Extension Strengthening;Right;Weights  3x10 reps   Extension Weight (lbs) 3   Shoulder Exercises:  Standing   Protraction Strengthening;Right;Theraband  3x10 reps   Theraband Level (Shoulder Protraction) Level 2 (Red)   External Rotation Strengthening;Right;Theraband  3x10 reps   Theraband Level (Shoulder External Rotation) Level 2 (Red)   Internal Rotation Strengthening;Right;Theraband  3x10 reps   Theraband Level (Shoulder Internal Rotation) Level 2 (Red)   ABduction AROM;Right  x30 reps   Extension Strengthening;Right;Theraband  3x10 reps   Theraband Level (Shoulder Extension) Level 2 (Red)   Row Strengthening;Right;Theraband  3x10 reps   Theraband Level (Shoulder Row) Level 2 (Red)   Other Standing Exercises B scapular depression red  theraband x30 reps   Shoulder Exercises: Pulleys   Other Pulley Exercises RUE ranger flex/circles x30 reps   Shoulder Exercises: ROM/Strengthening   UBE (Upper Arm Bike) 90 RPM x8 min   Other ROM/Strengthening Exercises Reclined RUE ABCs x1 rep   Other ROM/Strengthening Exercises Reclined R shoulder circles, triangles, squares x20 reps each                  PT Short Term Goals - 03/08/16 1140    PT SHORT TERM GOAL #1   Title I with inital HEP   Time 2   Period Weeks   Status Achieved   PT SHORT TERM GOAL #2   Title decrease pain in R shoulder by 25% with ADLS.   Time 2   Period Weeks   Status Achieved  03/08/16            PT Long Term Goals - 03/29/16 1208    PT LONG TERM GOAL #1   Title Able to perform ADLs including work requirements with R shoulder pain 2/10 or less.   Time 4   Period Weeks   Status On-going  patient has not returned to work 03/22/16   PT LONG TERM GOAL #2   Title Demo active R shoulder flex to 165 deg to improve function.   Time 4   Period Weeks   Status On-going  AROM R shoulder flex 140 deg in supine 03/29/2016   PT LONG TERM GOAL #3   Title Demo 5/5 R shoulder strength   Time 4   Period Weeks   Status On-going               Plan - 04/07/16 1144    Clinical Impression Statement Patient tolerated today's treatment fairly well as she is able to continue strengthening within limitations of protocol and MD. Patient continues to report experiencing R shoulder crackling with movememt as well as superioanterior R shoulder pain around an incision site. Patient reported that she was told that is where they removed a cyst. Patient experienced "a little" pain that was "not excruciating" per patient report. Patient continues to do well with R shoulder stabilization exercises with good stability noted with R shoulder rhythmic stabilizations. Upon lowering R shoulder from flexion or shoulder height patient experiences discomfort per patient  report and observation. R shoulder flexion in supine measured as 160 deg today. Patient denied pain at rest following today's treatment.   Rehab Potential Excellent   Clinical Impairments Affecting Rehab Potential 01/20/16 surgery current 9 weeks 03/23/16   PT Frequency 3x / week   PT Duration 4 weeks   PT Treatment/Interventions ADLs/Self Care Home Management;Electrical Stimulation;Cryotherapy;Moist Heat;Therapeutic exercise;Therapeutic activities;Ultrasound;Neuromuscular re-education;Patient/family education;Manual techniques;Passive range of motion;Vasopneumatic Device   PT Next Visit Plan cont per patient tolerance/ New Rx for strenghening. Refer to scanned protocol for guidance. Continue STW to posterior RC and scapular mobs  PT Home Exercise Plan shoulder isometrics   Consulted and Agree with Plan of Care Patient      Patient will benefit from skilled therapeutic intervention in order to improve the following deficits and impairments:  Pain, Decreased range of motion, Decreased activity tolerance, Decreased strength, Impaired UE functional use  Visit Diagnosis: Right shoulder pain - Plan: PT plan of care cert/re-cert  Stiffness of right shoulder, not elsewhere classified - Plan: PT plan of care cert/re-cert     Problem List Patient Active Problem List   Diagnosis Date Noted  . Diverticulitis large intestine 01/07/2015  . Diverticulitis 07/03/2014  . Hypokalemia 07/03/2014  . Tobacco abuse   . Obesity   . Upper airway cough syndrome 01/28/2014  . IBS (irritable bowel syndrome) 07/10/2013  . Acute bilateral lower abdominal pain 03/26/2013  . Tiredness 03/26/2013  . Sigmoid diverticulitis 09/12/2012  . Epigastric pain 05/22/2012  . Nausea and vomiting 01/28/2012  . Crohn's disease of ileum ([place]) 01/10/2012  . GERD (gastroesophageal reflux disease) 01/10/2012  . HTN (hypertension) 01/10/2012    Ahmed Prima, PTA 04/07/2016 12:15 PM Mali Applegate MPT [facility] [address] [place], [place], [address] Phone: [phone]   Fax:  [phone]  Name: Elizabeth [name] MRN: [id number] Date of Birth: 01/27/1961

## 2016-04-09 ENCOUNTER — Ambulatory Visit: Payer: Worker's Compensation | Admitting: Physical Therapy

## 2016-04-09 ENCOUNTER — Encounter: Payer: Self-pay | Admitting: Physical Therapy

## 2016-04-09 DIAGNOSIS — M25511 Pain in right shoulder: Secondary | ICD-10-CM

## 2016-04-09 DIAGNOSIS — M25611 Stiffness of right shoulder, not elsewhere classified: Secondary | ICD-10-CM

## 2016-04-09 NOTE — Therapy (Signed)
[facility] [place], [place], [address] Phone: [phone]   Fax:  [phone]  Physical Therapy Treatment  Patient Details  Name: Elizabeth [name] MRN: [id number] Date of Birth: Jun 06, 1961 Referring Provider: Meredith Pel MD.  Encounter Date: 04/09/2016      PT End of Session - 04/12/16 1737    Number of Visits 58      Past Medical History  Diagnosis Date  . Crohn's disease ([place])   . Elevated transaminase level   . Hypertension   . Bronchitis   . Cough 01/28/2014    upper airway cough syndrome  . Diverticulitis   . Tobacco abuse   . Obesity   . GERD (gastroesophageal reflux disease)   . Pneumonia 1/15    Past Surgical History  Procedure Laterality Date  . Hemocolectomy  2005    right  . Abdominal hysterectomy  1980  . Appendectomy    . Colonoscopy    . Esophagogastroduodenoscopy  06/16/2012    Procedure: ESOPHAGOGASTRODUODENOSCOPY (EGD);  Surgeon: Rogene Houston, MD;  [facility];  Service: Endoscopy;  Laterality: N/A;  10:30 AM  . Colonoscopy N/A 08/16/2014    Procedure: COLONOSCOPY;  Surgeon: Rogene Houston, MD;  [facility];  Service: Endoscopy;  Laterality: N/A;  1200-rescheduled to 9/4 @ 10:45 Ann notified pt  . Laparoscopic sigmoid colectomy N/A 01/07/2015    Procedure: LOW ANTERIOR COLON RESECTION;  Surgeon: Fanny Skates, MD;  Location: [place];  Service: General;  Laterality: N/A;  . Shoulder arthroscopy with labral repair Right 01/20/2016    Procedure: SHOULDER ARTHROSCOPY WITH LABRAL REPAIR;  Surgeon: Meredith Pel, MD;  Location: [place];  Service: Orthopedics;  Laterality: Right;    There were no vitals filed for this visit.      Subjective Assessment - 04/12/16 1122    Subjective Patient reported increased pain after sleeping on arm and it hanging off bed.   Pertinent History HTN, Crohns Disease   Diagnostic tests mri, xray; results: mod RC tendinopathy/tendinosis,  shallow tears of supraspinatus (no full thickness tears); ant/inf labral tear; mod GH degenerative changes   Patient Stated Goals Get back to work.   Currently in Pain? Yes   Pain Score 7    Pain Location Shoulder   Pain Orientation Right   Pain Descriptors / Indicators Sore   Pain Type Surgical pain   Pain Onset More than a month ago   Pain Frequency Intermittent   Aggravating Factors  use and laying on shoulder   Pain Relieving Factors at rest                         [facility] Adult PT Treatment/Exercise - 04/12/16 0001    Shoulder Exercises: Supine   Other Supine Exercises ceiling punch 2x10 2#   Shoulder Exercises: Prone   Retraction Strengthening;Right;Weights  3x10   Retraction Weight (lbs) 3   Extension Strengthening;Right;Weights  3x10   Extension Weight (lbs) 3   Shoulder Exercises: Sidelying   External Rotation Strengthening;Right;Weights  3x10   External Rotation Weight (lbs) 2   Shoulder Exercises: Standing   Protraction Strengthening;Right;Theraband  3x10   Theraband Level (Shoulder Protraction) Level 2 (Red)   External Rotation Strengthening;Right;Theraband  3x10   Theraband Level (Shoulder External Rotation) Level 2 (Red)   Internal Rotation Strengthening;Right;Theraband  3x10   Theraband Level (Shoulder Internal Rotation) Level 2 (Red)   ABduction Strengthening;Right  scaption 1# 3x10  Extension Strengthening;Right;Theraband  3x10   Theraband Level (Shoulder Extension) Level 2 (Red)   Other Standing Exercises wall slide with eccentric lowering 2x10   Shoulder Exercises: Pulleys   Flexion --  85mn   Other Pulley Exercises RUE ranger flex/circles x30 reps   Shoulder Exercises: ROM/Strengthening   UBE (Upper Arm Bike) 90 RPM x6 min   Other ROM/Strengthening Exercises Reclined RUE ABCs x1 rep with 1#                  PT Short Term Goals - 03/08/16 1140    PT SHORT TERM GOAL #1   Title I with inital HEP   Time 2   Period  Weeks   Status Achieved   PT SHORT TERM GOAL #2   Title decrease pain in R shoulder by 25% with ADLS.   Time 2   Period Weeks   Status Achieved  03/08/16            PT Long Term Goals - 04/12/16 1147    PT LONG TERM GOAL #1   Title Able to perform ADLs including work requirements with R shoulder pain 2/10 or less.   Time 4   Period Weeks   Status On-going  2-3/10 with ADL's and has not returned to work 04/12/16   PT LONG TERM GOAL #2   Title Demo active R shoulder flex to 165 deg to improve function.   Time 4   Period Weeks   Status On-going  AROM 145 degrees 04/12/16   PT LONG TERM GOAL #3   Title Demo 5/5 R shoulder strength   Time 4   Period Weeks   Status On-going               Plan - 04/12/16 1154    Clinical Impression Statement Patient continues to progress with exercises with no reports of pain only some crackling and popping with certain movements. Patient reports pain when sleeping on shoulder and perform some ADL's. Patient has used arm pushing and pulling movements causeing pain. Patient has decresed AROM for shoulder flexion due to soreness today. Goals ongoing due to pain, strength and ROM deficits.   Rehab Potential Excellent   Clinical Impairments Affecting Rehab Potential 01/20/16 surgery current 12 weeks 04/13/16   PT Frequency 3x / week   PT Duration 4 weeks   PT Treatment/Interventions ADLs/Self Care Home Management;Electrical Stimulation;Cryotherapy;Moist Heat;Therapeutic exercise;Therapeutic activities;Ultrasound;Neuromuscular re-education;Patient/family education;Manual techniques;Passive range of motion;Vasopneumatic Device   PT Next Visit Plan cont per patient tolerance/ New Rx for strenghening. Refer to scanned protocol for guidance. Continue STW to posterior RC and scapular mobs   Consulted and Agree with Plan of Care Patient      Patient will benefit from skilled therapeutic intervention in order to improve the following deficits and  impairments:  Pain, Decreased range of motion, Decreased activity tolerance, Decreased strength, Impaired UE functional use  Visit Diagnosis: Right shoulder pain - Plan: PT plan of care cert/re-cert  Stiffness of right shoulder, not elsewhere classified - Plan: PT plan of care cert/re-cert     Problem List Patient Active Problem List   Diagnosis Date Noted  . Diverticulitis large intestine 01/07/2015  . Diverticulitis 07/03/2014  . Hypokalemia 07/03/2014  . Tobacco abuse   . Obesity   . Upper airway cough syndrome 01/28/2014  . IBS (irritable bowel syndrome) 07/10/2013  . Acute bilateral lower abdominal pain 03/26/2013  . Tiredness 03/26/2013  . Sigmoid diverticulitis 09/12/2012  . Epigastric pain 05/22/2012  .  Nausea and vomiting 01/28/2012  . Crohn's disease of ileum ([place]) 01/10/2012  . GERD (gastroesophageal reflux disease) 01/10/2012  . HTN (hypertension) 01/10/2012    [place], Mal[place], PTA 04/12/2016, 5:39 PM Mali Applegate MPT [facility] [address] [place], [place], [address] Phone: [phone]   Fax:  [phone]  Name: Elizabeth [name] MRN: [id number] Date of Birth: 09/29/1961

## 2016-04-12 ENCOUNTER — Encounter: Payer: Self-pay | Admitting: Physical Therapy

## 2016-04-12 ENCOUNTER — Ambulatory Visit: Payer: PRIVATE HEALTH INSURANCE | Attending: Orthopedic Surgery | Admitting: Physical Therapy

## 2016-04-12 DIAGNOSIS — M25511 Pain in right shoulder: Secondary | ICD-10-CM

## 2016-04-12 DIAGNOSIS — M25611 Stiffness of right shoulder, not elsewhere classified: Secondary | ICD-10-CM | POA: Insufficient documentation

## 2016-04-12 NOTE — Therapy (Signed)
[facility] [place], [place], [address] Phone: [phone]   Fax:  [phone]  Physical Therapy Treatment  Patient Details  Name: Elizabeth [name] MRN: [id number] Date of Birth: 08/04/1961 Referring Provider: Meredith Pel MD.  Encounter Date: 04/12/2016      PT End of Session - 04/12/16 1146    Visit Number 19   Date for PT Re-Evaluation 06/09/16   PT Start Time 1116   PT Stop Time 1157   PT Time Calculation (min) 41 min   Activity Tolerance Patient tolerated treatment well   Behavior During Therapy [facility] for tasks assessed/performed      Past Medical History  Diagnosis Date  . Crohn's disease ([place])   . Elevated transaminase level   . Hypertension   . Bronchitis   . Cough 01/28/2014    upper airway cough syndrome  . Diverticulitis   . Tobacco abuse   . Obesity   . GERD (gastroesophageal reflux disease)   . Pneumonia 1/15    Past Surgical History  Procedure Laterality Date  . Hemocolectomy  2005    right  . Abdominal hysterectomy  1980  . Appendectomy    . Colonoscopy    . Esophagogastroduodenoscopy  06/16/2012    Procedure: ESOPHAGOGASTRODUODENOSCOPY (EGD);  Surgeon: Rogene Houston, MD;  [facility];  Service: Endoscopy;  Laterality: N/A;  10:30 AM  . Colonoscopy N/A 08/16/2014    Procedure: COLONOSCOPY;  Surgeon: Rogene Houston, MD;  [facility];  Service: Endoscopy;  Laterality: N/A;  1200-rescheduled to 9/4 @ 10:45 Ann notified pt  . Laparoscopic sigmoid colectomy N/A 01/07/2015    Procedure: LOW ANTERIOR COLON RESECTION;  Surgeon: Fanny Skates, MD;  Location: [place];  Service: General;  Laterality: N/A;  . Shoulder arthroscopy with labral repair Right 01/20/2016    Procedure: SHOULDER ARTHROSCOPY WITH LABRAL REPAIR;  Surgeon: Meredith Pel, MD;  Location: [place];  Service: Orthopedics;  Laterality: Right;    There were no vitals filed for this visit.      Subjective  Assessment - 04/12/16 1122    Subjective Patient reported increased pain after sleeping on arm and it hanging off bed.   Pertinent History HTN, Crohns Disease   Diagnostic tests mri, xray; results: mod RC tendinopathy/tendinosis, shallow tears of supraspinatus (no full thickness tears); ant/inf labral tear; mod GH degenerative changes   Patient Stated Goals Get back to work.   Currently in Pain? Yes   Pain Score 7    Pain Location Shoulder   Pain Orientation Right   Pain Descriptors / Indicators Sore   Pain Type Surgical pain   Pain Onset More than a month ago   Pain Frequency Intermittent   Aggravating Factors  use and laying on shoulder   Pain Relieving Factors at rest                         [facility] Adult PT Treatment/Exercise - 04/12/16 0001    Shoulder Exercises: Supine   Other Supine Exercises ceiling punch 2x10 2#   Shoulder Exercises: Prone   Retraction Strengthening;Right;Weights  3x10   Retraction Weight (lbs) 3   Extension Strengthening;Right;Weights  3x10   Extension Weight (lbs) 3   Shoulder Exercises: Sidelying   External Rotation Strengthening;Right;Weights  3x10   External Rotation Weight (lbs) 2   Shoulder Exercises: Standing   Protraction Strengthening;Right;Theraband  3x10   Theraband Level (Shoulder Protraction) Level 2 (  Red)   External Rotation Strengthening;Right;Theraband  3x10   Theraband Level (Shoulder External Rotation) Level 2 (Red)   Internal Rotation Strengthening;Right;Theraband  3x10   Theraband Level (Shoulder Internal Rotation) Level 2 (Red)   ABduction Strengthening;Right  scaption 1# 3x10   Extension Strengthening;Right;Theraband  3x10   Theraband Level (Shoulder Extension) Level 2 (Red)   Other Standing Exercises wall slide with eccentric lowering 2x10   Shoulder Exercises: Pulleys   Flexion --  8mn   Other Pulley Exercises RUE ranger flex/circles x30 reps   Shoulder Exercises: ROM/Strengthening   UBE (Upper Arm  Bike) 90 RPM x6 min   Other ROM/Strengthening Exercises Reclined RUE ABCs x1 rep with 1#                  PT Short Term Goals - 03/08/16 1140    PT SHORT TERM GOAL #1   Title I with inital HEP   Time 2   Period Weeks   Status Achieved   PT SHORT TERM GOAL #2   Title decrease pain in R shoulder by 25% with ADLS.   Time 2   Period Weeks   Status Achieved  03/08/16            PT Long Term Goals - 04/12/16 1147    PT LONG TERM GOAL #1   Title Able to perform ADLs including work requirements with R shoulder pain 2/10 or less.   Time 4   Period Weeks   Status On-going  2-3/10 with ADL's and has not returned to work 04/12/16   PT LONG TERM GOAL #2   Title Demo active R shoulder flex to 165 deg to improve function.   Time 4   Period Weeks   Status On-going  AROM 145 degrees 04/12/16   PT LONG TERM GOAL #3   Title Demo 5/5 R shoulder strength   Time 4   Period Weeks   Status On-going               Plan - 04/12/16 1154    Clinical Impression Statement Patient continues to progress with exercises with no reports of pain only some crackling and popping with certain movements. Patient reports pain when sleeping on shoulder and perform some ADL's. Patient has used arm pushing and pulling movements causeing pain. Patient has decresed AROM for shoulder flexion due to soreness today. Goals ongoing due to pain, strength and ROM deficits.   Rehab Potential Excellent   Clinical Impairments Affecting Rehab Potential 01/20/16 surgery current 12 weeks 04/13/16   PT Frequency 3x / week   PT Duration 4 weeks   PT Treatment/Interventions ADLs/Self Care Home Management;Electrical Stimulation;Cryotherapy;Moist Heat;Therapeutic exercise;Therapeutic activities;Ultrasound;Neuromuscular re-education;Patient/family education;Manual techniques;Passive range of motion;Vasopneumatic Device   PT Next Visit Plan cont per patient tolerance/ New Rx for strenghening. Refer to scanned protocol for  guidance. Continue STW to posterior RC and scapular mobs   Consulted and Agree with Plan of Care Patient      Patient will benefit from skilled therapeutic intervention in order to improve the following deficits and impairments:  Pain, Decreased range of motion, Decreased activity tolerance, Decreased strength, Impaired UE functional use  Visit Diagnosis: Right shoulder pain  Stiffness of right shoulder, not elsewhere classified  Decreased range of motion of right shoulder     Problem List Patient Active Problem List   Diagnosis Date Noted  . Diverticulitis large intestine 01/07/2015  . Diverticulitis 07/03/2014  . Hypokalemia 07/03/2014  . Tobacco abuse   .  Obesity   . Upper airway cough syndrome 01/28/2014  . IBS (irritable bowel syndrome) 07/10/2013  . Acute bilateral lower abdominal pain 03/26/2013  . Tiredness 03/26/2013  . Sigmoid diverticulitis 09/12/2012  . Epigastric pain 05/22/2012  . Nausea and vomiting 01/28/2012  . Crohn's disease of ileum ([place]) 01/10/2012  . GERD (gastroesophageal reflux disease) 01/10/2012  . HTN (hypertension) 01/10/2012    Shavonta Gossen P, PTA 04/12/2016, 12:01 PM  [facility] [address] [place], [place], [address] Phone: [phone]   Fax:  [phone]  Name: Elizabeth [name] MRN: [id number] Date of Birth: Apr 05, 1961

## 2016-04-14 ENCOUNTER — Ambulatory Visit: Payer: PRIVATE HEALTH INSURANCE | Admitting: Physical Therapy

## 2016-04-14 DIAGNOSIS — M25511 Pain in right shoulder: Secondary | ICD-10-CM

## 2016-04-14 DIAGNOSIS — M25611 Stiffness of right shoulder, not elsewhere classified: Secondary | ICD-10-CM

## 2016-04-14 NOTE — Therapy (Signed)
[facility] [place], [place], [address] Phone: [phone]   Fax:  [phone]  Physical Therapy Treatment  Patient Details  Name: FONTAINE KOSSMAN MRN: [id number] Date of Birth: November 10, 1961 Referring Provider: Meredith Pel MD.  Encounter Date: 04/14/2016      PT End of Session - 04/14/16 1204    Visit Number 20   Number of Visits 29   PT Start Time 1115   PT Stop Time 1201   PT Time Calculation (min) 46 min   Activity Tolerance Patient tolerated treatment well   Behavior During Therapy [facility] for tasks assessed/performed      Past Medical History  Diagnosis Date  . Crohn's disease ([place])   . Elevated transaminase level   . Hypertension   . Bronchitis   . Cough 01/28/2014    upper airway cough syndrome  . Diverticulitis   . Tobacco abuse   . Obesity   . GERD (gastroesophageal reflux disease)   . Pneumonia 1/15    Past Surgical History  Procedure Laterality Date  . Hemocolectomy  2005    right  . Abdominal hysterectomy  1980  . Appendectomy    . Colonoscopy    . Esophagogastroduodenoscopy  06/16/2012    Procedure: ESOPHAGOGASTRODUODENOSCOPY (EGD);  Surgeon: Rogene Houston, MD;  [facility];  Service: Endoscopy;  Laterality: N/A;  10:30 AM  . Colonoscopy N/A 08/16/2014    Procedure: COLONOSCOPY;  Surgeon: Rogene Houston, MD;  [facility];  Service: Endoscopy;  Laterality: N/A;  1200-rescheduled to 9/4 @ 10:45 Ann notified pt  . Laparoscopic sigmoid colectomy N/A 01/07/2015    Procedure: LOW ANTERIOR COLON RESECTION;  Surgeon: Fanny Skates, MD;  Location: [place];  Service: General;  Laterality: N/A;  . Shoulder arthroscopy with labral repair Right 01/20/2016    Procedure: SHOULDER ARTHROSCOPY WITH LABRAL REPAIR;  Surgeon: Meredith Pel, MD;  Location: [place];  Service: Orthopedics;  Laterality: Right;    There were no vitals filed for this visit.      Subjective Assessment - 04/14/16  1213    Currently in Pain? No/denies  No pain reported at rest today by palpable pain present.                                   PT Short Term Goals - 03/08/16 1140    PT SHORT TERM GOAL #1   Title I with inital HEP   Time 2   Period Weeks   Status Achieved   PT SHORT TERM GOAL #2   Title decrease pain in R shoulder by 25% with ADLS.   Time 2   Period Weeks   Status Achieved  03/08/16            PT Long Term Goals - 04/12/16 1147    PT LONG TERM GOAL #1   Title Able to perform ADLs including work requirements with R shoulder pain 2/10 or less.   Time 4   Period Weeks   Status On-going  2-3/10 with ADL's and has not returned to work 04/12/16   PT LONG TERM GOAL #2   Title Demo active R shoulder flex to 165 deg to improve function.   Time 4   Period Weeks   Status On-going  AROM 145 degrees 04/12/16   PT LONG TERM GOAL #3   Title Demo 5/5 R shoulder strength  Time 4   Period Weeks   Status On-going               Plan - 04/14/16 1214    PT Next Visit Plan Sending cert for Iontophoresis.      Patient will benefit from skilled therapeutic intervention in order to improve the following deficits and impairments:     Visit Diagnosis: Right shoulder pain - Plan: PT plan of care cert/re-cert  Stiffness of right shoulder, not elsewhere classified - Plan: PT plan of care cert/re-cert     Problem List Patient Active Problem List   Diagnosis Date Noted  . Diverticulitis large intestine 01/07/2015  . Diverticulitis 07/03/2014  . Hypokalemia 07/03/2014  . Tobacco abuse   . Obesity   . Upper airway cough syndrome 01/28/2014  . IBS (irritable bowel syndrome) 07/10/2013  . Acute bilateral lower abdominal pain 03/26/2013  . Tiredness 03/26/2013  . Sigmoid diverticulitis 09/12/2012  . Epigastric pain 05/22/2012  . Nausea and vomiting 01/28/2012  . Crohn's disease of ileum ([place]) 01/10/2012  . GERD (gastroesophageal reflux disease)  01/10/2012  . HTN (hypertension) 01/10/2012   Treatment:  UBE x 8 minutes at 120 RPM's; UE Ranger x 3 minutes; RW 4 with red theraband 2 sets to fatigue; 3# full can; bent rows and full can 2 sets to fatigue.  In supine STW/M to right long head of biceps x 16 minutes. Bellina Tokarczyk, [place] MPT 04/14/2016, 12:18 PM  [facility] [address] [place], [place], [address] Phone: [phone]   Fax:  [phone]  Name: JADIE ALLINGTON MRN: [id number] Date of Birth: 1961/10/04

## 2016-04-15 ENCOUNTER — Ambulatory Visit: Payer: PRIVATE HEALTH INSURANCE | Admitting: Physical Therapy

## 2016-04-15 ENCOUNTER — Encounter: Payer: Self-pay | Admitting: Physical Therapy

## 2016-04-15 DIAGNOSIS — M25511 Pain in right shoulder: Secondary | ICD-10-CM

## 2016-04-15 DIAGNOSIS — M25611 Stiffness of right shoulder, not elsewhere classified: Secondary | ICD-10-CM

## 2016-04-15 NOTE — Therapy (Signed)
[facility] [place], [place], [address] Phone: [phone]   Fax:  [phone]  Physical Therapy Treatment  Patient Details  Name: Elizabeth [name] MRN: [id number] Date of Birth: [birth year] Referring Provider: Meredith Pel MD.  Encounter Date: 04/15/2016      PT End of Session - 04/15/16 0721    Visit Number 21   Number of Visits 29   Date for PT Re-Evaluation 06/09/16   PT Start Time 0731   PT Stop Time 0815   PT Time Calculation (min) 44 min   Activity Tolerance Patient limited by pain   Behavior During Therapy [facility] for tasks assessed/performed      Past Medical History  Diagnosis Date  . Crohn's disease ([place])   . Elevated transaminase level   . Hypertension   . Bronchitis   . Cough 01/28/2014    upper airway cough syndrome  . Diverticulitis   . Tobacco abuse   . Obesity   . GERD (gastroesophageal reflux disease)   . Pneumonia 1/15    Past Surgical History  Procedure Laterality Date  . Hemocolectomy  2005    right  . Abdominal hysterectomy  1980  . Appendectomy    . Colonoscopy    . Esophagogastroduodenoscopy  06/16/2012    Procedure: ESOPHAGOGASTRODUODENOSCOPY (EGD);  Surgeon: Rogene Houston, MD;  [facility];  Service: Endoscopy;  Laterality: N/A;  10:30 AM  . Colonoscopy N/A 08/16/2014    Procedure: COLONOSCOPY;  Surgeon: Rogene Houston, MD;  [facility];  Service: Endoscopy;  Laterality: N/A;  1200-rescheduled to 9/4 @ 10:45 Ann notified pt  . Laparoscopic sigmoid colectomy N/A 01/07/2015    Procedure: LOW ANTERIOR COLON RESECTION;  Surgeon: Fanny Skates, MD;  Location: [place];  Service: General;  Laterality: N/A;  . Shoulder arthroscopy with labral repair Right 01/20/2016    Procedure: SHOULDER ARTHROSCOPY WITH LABRAL REPAIR;  Surgeon: Meredith Pel, MD;  Location: [place];  Service: Orthopedics;  Laterality: Right;    There were no vitals filed for this visit.       Subjective Assessment - 04/15/16 0721    Subjective Reports that she had an "angry arm" last night and had redness around R acromian process and mid humerus.   Pertinent History HTN, Crohns Disease   Diagnostic tests mri, xray; results: mod RC tendinopathy/tendinosis, shallow tears of supraspinatus (no full thickness tears); ant/inf labral tear; mod GH degenerative changes   Patient Stated Goals Get back to work.   Currently in Pain? Yes   Pain Score 8    Pain Location Shoulder   Pain Orientation Right   Pain Descriptors / Indicators Sore   Pain Type Surgical pain   Pain Onset More than a month ago            [facility] PT Assessment - 04/15/16 0001    Assessment   Medical Diagnosis Right shoulder pain.   Onset Date/Surgical Date 01/20/16   Hand Dominance Right   Next MD Visit 04/25/2016                     [facility] Adult PT Treatment/Exercise - 04/15/16 0001    Shoulder Exercises: Prone   Retraction Strengthening;Right;Weights  3x10 reps   Retraction Weight (lbs) 3   Extension Strengthening;Right;Weights  3x10 reps   Extension Weight (lbs) 3   Shoulder Exercises: Standing   Protraction Strengthening;Right;Theraband  3x10 reps   Theraband Level (Shoulder Protraction) Level  2 (Red)   External Rotation Strengthening;Right;Theraband  3 x10 reps   Theraband Level (Shoulder External Rotation) Level 2 (Red)   Internal Rotation Strengthening;Right;Theraband  3x10 reps   Theraband Level (Shoulder Internal Rotation) Level 2 (Red)   Extension Strengthening;Right;Theraband  3x10 reps   Theraband Level (Shoulder Extension) Level 2 (Red)   Row Strengthening;Right;Theraband  3x10 reps   Theraband Level (Shoulder Row) Level 2 (Red)   Other Standing Exercises B scapular depression red theraband x30 reps   Other Standing Exercises wall slide with eccentric lowering 2x10   Shoulder Exercises: Pulleys   Other Pulley Exercises RUE ranger flex/circles x30 reps   Shoulder  Exercises: ROM/Strengthening   UBE (Upper Arm Bike) 120 RPM x8 min   Modalities   Modalities Electrical Stimulation;Moist Heat   Moist Heat Therapy   Number Minutes Moist Heat 15 Minutes   Moist Heat Location Shoulder   Electrical Stimulation   Electrical Stimulation Location R shoulder   Electrical Stimulation Action Pre-Mod   Electrical Stimulation Parameters 80-150 Hz x15 min   Electrical Stimulation Goals Pain                  PT Short Term Goals - 03/08/16 1140    PT SHORT TERM GOAL #1   Title I with inital HEP   Time 2   Period Weeks   Status Achieved   PT SHORT TERM GOAL #2   Title decrease pain in R shoulder by 25% with ADLS.   Time 2   Period Weeks   Status Achieved  03/08/16            PT Long Term Goals - 04/12/16 1147    PT LONG TERM GOAL #1   Title Able to perform ADLs including work requirements with R shoulder pain 2/10 or less.   Time 4   Period Weeks   Status On-going  2-3/10 with ADL's and has not returned to work 04/12/16   PT LONG TERM GOAL #2   Title Demo active R shoulder flex to 165 deg to improve function.   Time 4   Period Weeks   Status On-going  AROM 145 degrees 04/12/16   PT LONG TERM GOAL #3   Title Demo 5/5 R shoulder strength   Time 4   Period Weeks   Status On-going               Plan - 04/15/16 5883    Clinical Impression Statement Patient limited by pain today following previous treatment's manual therapy. Patient experienced increased R shoulder discomfort and redness several hours after previous treatment. Patient arrived at PT clinic with increased pain and tighness. Experienced jumping and gasps due to pain in R shoulder with exercises. Patient agreed to modalities today to decrease pain as iontophoresis recert has not been signed by MD. Normal modaltiies response noted following removal of the modaliites. Goals remain on-going at this time secondary to decreased R shoulder flexion, strength deficits, patient has  not returned to work at this time. Patient experienced R shoulder "feeling better" although she acknowledged that pain was still high.   Rehab Potential Excellent   Clinical Impairments Affecting Rehab Potential 01/20/16 surgery current 12 weeks 04/13/16   PT Frequency 3x / week   PT Duration 4 weeks   PT Treatment/Interventions ADLs/Self Care Home Management;Electrical Stimulation;Cryotherapy;Moist Heat;Therapeutic exercise;Therapeutic activities;Ultrasound;Neuromuscular re-education;Patient/family education;Manual techniques;Passive range of motion;Vasopneumatic Device   PT Next Visit Plan Continue per protocol and MPT POC. Check for iontophoresis sign off.  PT Home Exercise Plan shoulder isometrics   Consulted and Agree with Plan of Care Patient      Patient will benefit from skilled therapeutic intervention in order to improve the following deficits and impairments:  Pain, Decreased range of motion, Decreased activity tolerance, Decreased strength, Impaired UE functional use  Visit Diagnosis: Right shoulder pain  Stiffness of right shoulder, not elsewhere classified     Problem List Patient Active Problem List   Diagnosis Date Noted  . Diverticulitis large intestine 01/07/2015  . Diverticulitis 07/03/2014  . Hypokalemia 07/03/2014  . Tobacco abuse   . Obesity   . Upper airway cough syndrome 01/28/2014  . IBS (irritable bowel syndrome) 07/10/2013  . Acute bilateral lower abdominal pain 03/26/2013  . Tiredness 03/26/2013  . Sigmoid diverticulitis 09/12/2012  . Epigastric pain 05/22/2012  . Nausea and vomiting 01/28/2012  . Crohn's disease of ileum ([place]) 01/10/2012  . GERD (gastroesophageal reflux disease) 01/10/2012  . HTN (hypertension) 01/10/2012    Wynelle Fanny, PTA 04/15/2016, 8:18 AM  [facility] [address] [place], [place], [address] Phone: [phone]   Fax:  [phone]  Name: AMRA SHUKLA MRN: [id number] Date  of Birth: [birth year]

## 2016-04-16 ENCOUNTER — Encounter: Payer: PRIVATE HEALTH INSURANCE | Admitting: Physical Therapy

## 2016-04-19 ENCOUNTER — Encounter: Payer: Self-pay | Admitting: Physical Therapy

## 2016-04-19 ENCOUNTER — Ambulatory Visit: Payer: PRIVATE HEALTH INSURANCE | Admitting: Physical Therapy

## 2016-04-19 DIAGNOSIS — M25611 Stiffness of right shoulder, not elsewhere classified: Secondary | ICD-10-CM

## 2016-04-19 DIAGNOSIS — M25511 Pain in right shoulder: Secondary | ICD-10-CM

## 2016-04-19 NOTE — Therapy (Addendum)
[facility] [place], [place], [address] Phone: [phone]   Fax:  [phone]  Physical Therapy Treatment  Patient Details  Name: Elizabeth [name] MRN: [id number] Date of Birth: Aug 27, 1961 Referring Provider: Meredith Pel MD.  Encounter Date: 04/19/2016      PT End of Session - 04/19/16 1112    Visit Number 22   Number of Visits 29   Date for PT Re-Evaluation 06/09/16   PT Start Time 1115   PT Stop Time 1143  2 units secondary to patient having another appt following PT    PT Time Calculation (min) 28 min   Activity Tolerance Patient tolerated treatment well;Patient limited by pain   Behavior During Therapy [facility] for tasks assessed/performed      Past Medical History  Diagnosis Date  . Crohn's disease ([place])   . Elevated transaminase level   . Hypertension   . Bronchitis   . Cough 01/28/2014    upper airway cough syndrome  . Diverticulitis   . Tobacco abuse   . Obesity   . GERD (gastroesophageal reflux disease)   . Pneumonia 1/15    Past Surgical History  Procedure Laterality Date  . Hemocolectomy  2005    right  . Abdominal hysterectomy  1980  . Appendectomy    . Colonoscopy    . Esophagogastroduodenoscopy  06/16/2012    Procedure: ESOPHAGOGASTRODUODENOSCOPY (EGD);  Surgeon: Rogene Houston, MD;  [facility];  Service: Endoscopy;  Laterality: N/A;  10:30 AM  . Colonoscopy N/A 08/16/2014    Procedure: COLONOSCOPY;  Surgeon: Rogene Houston, MD;  [facility];  Service: Endoscopy;  Laterality: N/A;  1200-rescheduled to 9/4 @ 10:45 Ann notified pt  . Laparoscopic sigmoid colectomy N/A 01/07/2015    Procedure: LOW ANTERIOR COLON RESECTION;  Surgeon: Fanny Skates, MD;  Location: [place];  Service: General;  Laterality: N/A;  . Shoulder arthroscopy with labral repair Right 01/20/2016    Procedure: SHOULDER ARTHROSCOPY WITH LABRAL REPAIR;  Surgeon: Meredith Pel, MD;  Location: [place];   Service: Orthopedics;  Laterality: Right;    There were no vitals filed for this visit.      Subjective Assessment - 04/19/16 1112    Subjective Reports that her shoulder is still sore and reports she has to leave at 11:45 secondary to another appointment. States that she has to drive 5 hours tomorrow.   Pertinent History HTN, Crohns Disease   Diagnostic tests mri, xray; results: mod RC tendinopathy/tendinosis, shallow tears of supraspinatus (no full thickness tears); ant/inf labral tear; mod GH degenerative changes   Patient Stated Goals Get back to work.   Currently in Pain? Yes   Pain Score 5    Pain Location Shoulder   Pain Orientation Right   Pain Descriptors / Indicators Sore   Pain Type Surgical pain   Pain Onset More than a month ago            [facility] PT Assessment - 04/19/16 0001    Assessment   Medical Diagnosis Right shoulder pain.   Onset Date/Surgical Date 01/20/16   Hand Dominance Right   Next MD Visit 04/28/2016                     [facility] Adult PT Treatment/Exercise - 04/19/16 0001    Shoulder Exercises: Prone   Retraction Strengthening;Right;Weights  3x10 reps   Retraction Weight (lbs) 3   Extension Strengthening;Right;Weights  3x10 reps  Extension Weight (lbs) 3   Shoulder Exercises: Sidelying   External Rotation Strengthening;Right;Weights  3x10 reps   External Rotation Weight (lbs) 1   Shoulder Exercises: Standing   Protraction Strengthening;Right;Theraband  3x10 reps   Theraband Level (Shoulder Protraction) Level 2 (Red)   External Rotation Strengthening;Right;Theraband  3x10 reps   Theraband Level (Shoulder External Rotation) Level 2 (Red)   Internal Rotation Strengthening;Right;Theraband  3x10 reps   Theraband Level (Shoulder Internal Rotation) Level 2 (Red)   Flexion Strengthening;Both;Weights  3x10 reps   Shoulder Flexion Weight (lbs) 1   ABduction AROM;Right;Other (comment)  x30 reps; Experienced pull in R bicep with  exercise   Extension Strengthening;Right;Theraband  3x10 reps   Theraband Level (Shoulder Extension) Level 2 (Red)   Row Strengthening;Right;Theraband  3x10 reps   Theraband Level (Shoulder Row) Level 2 (Red)   Other Standing Exercises B scapular depression red theraband x30 reps   Shoulder Exercises: Pulleys   Other Pulley Exercises RUE ranger flex/circles x30 reps   Shoulder Exercises: ROM/Strengthening   UBE (Upper Arm Bike) 120 RPM x5 min   Manual Therapy   Manual Therapy Myofascial release   Myofascial Release MFR/TPR to R UT/Levator Scapula region in sitting to decrease tightness and pain                  PT Short Term Goals - 03/08/16 1140    PT SHORT TERM GOAL #1   Title I with inital HEP   Time 2   Period Weeks   Status Achieved   PT SHORT TERM GOAL #2   Title decrease pain in R shoulder by 25% with ADLS.   Time 2   Period Weeks   Status Achieved  03/08/16            PT Long Term Goals - 04/12/16 1147    PT LONG TERM GOAL #1   Title Able to perform ADLs including work requirements with R shoulder pain 2/10 or less.   Time 4   Period Weeks   Status On-going  2-3/10 with ADL's and has not returned to work 04/12/16   PT LONG TERM GOAL #2   Title Demo active R shoulder flex to 165 deg to improve function.   Time 4   Period Weeks   Status On-going  AROM 145 degrees 04/12/16   PT LONG TERM GOAL #3   Title Demo 5/5 R shoulder strength   Time 4   Period Weeks   Status On-going               Plan - 04/19/16 1146    Clinical Impression Statement Patient limited to shortened treatment secondary to another appointment following PT. Patient continues to present with R AC joint discomfort and had complaints of pain into R Triceps region and lateral upper arm. Completed exercises fairly well as R shoulder weakness was noted with standing shoulder flexion with 1# as RUE had difficulty in flexing to shoulder height as LUE did. MFR/TPR was completed to R  UT/ Levator Scapula in sitting as to decrease tightness with good release noted. Goals remain on-going at this time secondary to deficits with R shoulder flexion and strength as well as patient not returning to work at this time. Patient experienced R UT/ Levator Scapula region feeling "better" following today's treatment.   Rehab Potential Excellent   Clinical Impairments Affecting Rehab Potential 01/20/16 surgery current 12 weeks 04/13/16   PT Frequency 3x / week   PT Duration 4 weeks  PT Treatment/Interventions ADLs/Self Care Home Management;Electrical Stimulation;Cryotherapy;Moist Heat;Therapeutic exercise;Therapeutic activities;Ultrasound;Neuromuscular re-education;Patient/family education;Manual techniques;Passive range of motion;Vasopneumatic Device   PT Next Visit Plan Continue per protocol and MPT POC. Check for iontophoresis sign off.   PT Home Exercise Plan shoulder isometrics   Consulted and Agree with Plan of Care Patient      Patient will benefit from skilled therapeutic intervention in order to improve the following deficits and impairments:  Pain, Decreased range of motion, Decreased activity tolerance, Decreased strength, Impaired UE functional use  Visit Diagnosis: Right shoulder pain  Stiffness of right shoulder, not elsewhere classified     Problem List Patient Active Problem List   Diagnosis Date Noted  . Diverticulitis large intestine 01/07/2015  . Diverticulitis 07/03/2014  . Hypokalemia 07/03/2014  . Tobacco abuse   . Obesity   . Upper airway cough syndrome 01/28/2014  . IBS (irritable bowel syndrome) 07/10/2013  . Acute bilateral lower abdominal pain 03/26/2013  . Tiredness 03/26/2013  . Sigmoid diverticulitis 09/12/2012  . Epigastric pain 05/22/2012  . Nausea and vomiting 01/28/2012  . Crohn's disease of ileum ([place]) 01/10/2012  . GERD (gastroesophageal reflux disease) 01/10/2012  . HTN (hypertension) 01/10/2012    Wynelle Fanny, PTA 04/19/2016, 11:56  AM  [facility] [address] [place], [place], [address] Phone: [phone]   Fax:  [phone]  Name: LUCIELLE VOKES MRN: [id number] Date of Birth: February 10, 1961

## 2016-04-26 ENCOUNTER — Encounter: Payer: Self-pay | Admitting: Physical Therapy

## 2016-04-26 ENCOUNTER — Ambulatory Visit: Payer: PRIVATE HEALTH INSURANCE | Admitting: Physical Therapy

## 2016-04-26 DIAGNOSIS — M25611 Stiffness of right shoulder, not elsewhere classified: Secondary | ICD-10-CM

## 2016-04-26 DIAGNOSIS — M25511 Pain in right shoulder: Secondary | ICD-10-CM

## 2016-04-26 NOTE — Therapy (Signed)
[facility] [place], [place], [address] Phone: [phone]   Fax:  [phone]  Physical Therapy Treatment  Patient Details  Name: Elizabeth [name] MRN: [id number] Date of Birth: 22-Jul-1961 Referring Provider: Meredith Pel MD.  Encounter Date: 04/26/2016      PT End of Session - 04/26/16 0827    Visit Number 23   Number of Visits 29   Date for PT Re-Evaluation 06/09/16   PT Start Time 0814   PT Stop Time 0856   PT Time Calculation (min) 42 min   Activity Tolerance Patient tolerated treatment well;Patient limited by pain   Behavior During Therapy [facility] for tasks assessed/performed      Past Medical History  Diagnosis Date  . Crohn's disease ([place])   . Elevated transaminase level   . Hypertension   . Bronchitis   . Cough 01/28/2014    upper airway cough syndrome  . Diverticulitis   . Tobacco abuse   . Obesity   . GERD (gastroesophageal reflux disease)   . Pneumonia 1/15    Past Surgical History  Procedure Laterality Date  . Hemocolectomy  2005    right  . Abdominal hysterectomy  1980  . Appendectomy    . Colonoscopy    . Esophagogastroduodenoscopy  06/16/2012    Procedure: ESOPHAGOGASTRODUODENOSCOPY (EGD);  Surgeon: Rogene Houston, MD;  [facility];  Service: Endoscopy;  Laterality: N/A;  10:30 AM  . Colonoscopy N/A 08/16/2014    Procedure: COLONOSCOPY;  Surgeon: Rogene Houston, MD;  [facility];  Service: Endoscopy;  Laterality: N/A;  1200-rescheduled to 9/4 @ 10:45 Ann notified pt  . Laparoscopic sigmoid colectomy N/A 01/07/2015    Procedure: LOW ANTERIOR COLON RESECTION;  Surgeon: Fanny Skates, MD;  Location: [place];  Service: General;  Laterality: N/A;  . Shoulder arthroscopy with labral repair Right 01/20/2016    Procedure: SHOULDER ARTHROSCOPY WITH LABRAL REPAIR;  Surgeon: Meredith Pel, MD;  Location: [place];  Service: Orthopedics;  Laterality: Right;    There were no vitals  filed for this visit.      Subjective Assessment - 04/26/16 0818    Subjective Patient reports that her shoulder is still very sore   Pertinent History HTN, Crohns Disease   Diagnostic tests mri, xray; results: mod RC tendinopathy/tendinosis, shallow tears of supraspinatus (no full thickness tears); ant/inf labral tear; mod GH degenerative changes   Patient Stated Goals Get back to work.   Currently in Pain? Yes   Pain Score 8    Pain Location Shoulder   Pain Orientation Right   Pain Descriptors / Indicators Sore   Pain Type Surgical pain   Pain Onset More than a month ago   Pain Frequency Intermittent   Aggravating Factors  Laying on shoulder or use of shoulder   Pain Relieving Factors at rest            [facility] PT Assessment - 04/26/16 0001    AROM   AROM Assessment Site Shoulder   Right/Left Shoulder Right   Right Shoulder Flexion 140 Degrees  may be limited by pain   Strength   Overall Strength Deficits   Strength Assessment Site Shoulder   Right/Left Shoulder Right   Right Shoulder Flexion 4/5   Right Shoulder Internal Rotation 4+/5   Right Shoulder External Rotation 4-/5                     [facility] Adult  PT Treatment/Exercise - 04/26/16 0001    Shoulder Exercises: Prone   Retraction Strengthening;Right;Weights  3x10   Retraction Weight (lbs) 3   Extension Strengthening;Right;Weights  3x10   Extension Weight (lbs) 3   Shoulder Exercises: Sidelying   External Rotation Strengthening;Right;Weights  3x10   External Rotation Weight (lbs) 2   Shoulder Exercises: Standing   Protraction Strengthening;Right;Theraband  3x10   Theraband Level (Shoulder Protraction) Level 2 (Red)   External Rotation Strengthening;Right;Theraband  3x10   Theraband Level (Shoulder External Rotation) Level 2 (Red)   Internal Rotation Strengthening;Right;Theraband  2x10   Theraband Level (Shoulder Internal Rotation) Level 2 (Red)   Flexion --  3x10   Shoulder Flexion  Weight (lbs) 1   Extension Strengthening;Right;Theraband  3x10   Theraband Level (Shoulder Extension) Level 2 (Red)   Row Strengthening;Right;Theraband  3x10   Theraband Level (Shoulder Row) Level 2 (Red)   Other Standing Exercises wall push ups 2x10   Other Standing Exercises wall slide with eccentric lowering 2x10   Shoulder Exercises: Pulleys   Other Pulley Exercises RUE ranger flex/circles x30 reps   Shoulder Exercises: ROM/Strengthening   UBE (Upper Arm Bike) 90 RPM x6 min                  PT Short Term Goals - 03/08/16 1140    PT SHORT TERM GOAL #1   Title I with inital HEP   Time 2   Period Weeks   Status Achieved   PT SHORT TERM GOAL #2   Title decrease pain in R shoulder by 25% with ADLS.   Time 2   Period Weeks   Status Achieved  03/08/16            PT Long Term Goals - 04/26/16 0828    PT LONG TERM GOAL #1   Title Able to perform ADLs including work requirements with R shoulder pain 2/10 or less.   Time 4   Period Weeks   Status On-going  7-8/10 with ADL's 04/26/16   PT LONG TERM GOAL #2   Title Demo active R shoulder flex to 165 deg to improve function.   Time 4   Period Weeks   Status On-going  AROM 140 degrees 04/26/16   PT LONG TERM GOAL #3   Title Demo 5/5 R shoulder strength   Time 4   Period Weeks   Status On-going  Strength shoulder flexion 4/5, ER -4/5, IR 4+/5 04/26/16               Plan - 04/26/16 0843    Clinical Impression Statement Patient slowly progressing due to ongoing increased pain in right shouler. Patient reports increased pain when laying on shoulder or with use. Patient stated she uses her arm more than she should with ADL's. Educated patient on protocol limitations to prevent re-injury and pain. Patient unable to meet any further goals due to full shoulder flexion ROM, pain and strength deficits.   Rehab Potential Excellent   Clinical Impairments Affecting Rehab Potential 01/20/16 surgery current 14 weeks  04/27/16   PT Frequency 3x / week   PT Duration 4 weeks   PT Treatment/Interventions ADLs/Self Care Home Management;Electrical Stimulation;Cryotherapy;Moist Heat;Therapeutic exercise;Therapeutic activities;Ultrasound;Neuromuscular re-education;Patient/family education;Manual techniques;Passive range of motion;Vasopneumatic Device   PT Next Visit Plan Continue per protocol and MPT POC. Check for iontophoresis sign off. MD Alphonzo Severance 04/28/16   Consulted and Agree with Plan of Care Patient      Patient will benefit from skilled therapeutic intervention  in order to improve the following deficits and impairments:  Pain, Decreased range of motion, Decreased activity tolerance, Decreased strength, Impaired UE functional use  Visit Diagnosis: Right shoulder pain  Stiffness of right shoulder, not elsewhere classified  Decreased range of motion of right shoulder     Problem List Patient Active Problem List   Diagnosis Date Noted  . Diverticulitis large intestine 01/07/2015  . Diverticulitis 07/03/2014  . Hypokalemia 07/03/2014  . Tobacco abuse   . Obesity   . Upper airway cough syndrome 01/28/2014  . IBS (irritable bowel syndrome) 07/10/2013  . Acute bilateral lower abdominal pain 03/26/2013  . Tiredness 03/26/2013  . Sigmoid diverticulitis 09/12/2012  . Epigastric pain 05/22/2012  . Nausea and vomiting 01/28/2012  . Crohn's disease of ileum ([place]) 01/10/2012  . GERD (gastroesophageal reflux disease) 01/10/2012  . HTN (hypertension) 01/10/2012    APPLEGATE, Mal[place], PTA 04/26/2016, 12:07 PM Mali Applegate MPT [facility] [place], [place], [address] Phone: [phone]   Fax:  [phone]  Name: Elizabeth [name] MRN: [id number] Date of Birth: 1961-05-24

## 2016-04-30 ENCOUNTER — Ambulatory Visit: Payer: PRIVATE HEALTH INSURANCE | Admitting: *Deleted

## 2016-04-30 DIAGNOSIS — M25611 Stiffness of right shoulder, not elsewhere classified: Secondary | ICD-10-CM

## 2016-04-30 DIAGNOSIS — M25511 Pain in right shoulder: Secondary | ICD-10-CM

## 2016-04-30 NOTE — Therapy (Signed)
[facility] [place], [place], [address] Phone: [phone]   Fax:  [phone]  Physical Therapy Treatment  Patient Details  Name: Elizabeth [name] MRN: [id number] Date of Birth: November 11, 1961 Referring Provider: Meredith Pel MD.  Encounter Date: 04/30/2016      PT End of Session - 04/30/16 0928    Visit Number 24   Number of Visits 29   Date for PT Re-Evaluation 06/09/16   PT Start Time 0815   PT Stop Time 0905   PT Time Calculation (min) 50 min      Past Medical History  Diagnosis Date  . Crohn's disease ([place])   . Elevated transaminase level   . Hypertension   . Bronchitis   . Cough 01/28/2014    upper airway cough syndrome  . Diverticulitis   . Tobacco abuse   . Obesity   . GERD (gastroesophageal reflux disease)   . Pneumonia 1/15    Past Surgical History  Procedure Laterality Date  . Hemocolectomy  2005    right  . Abdominal hysterectomy  1980  . Appendectomy    . Colonoscopy    . Esophagogastroduodenoscopy  06/16/2012    Procedure: ESOPHAGOGASTRODUODENOSCOPY (EGD);  Surgeon: Rogene Houston, MD;  [facility];  Service: Endoscopy;  Laterality: N/A;  10:30 AM  . Colonoscopy N/A 08/16/2014    Procedure: COLONOSCOPY;  Surgeon: Rogene Houston, MD;  [facility];  Service: Endoscopy;  Laterality: N/A;  1200-rescheduled to 9/4 @ 10:45 Ann notified pt  . Laparoscopic sigmoid colectomy N/A 01/07/2015    Procedure: LOW ANTERIOR COLON RESECTION;  Surgeon: Fanny Skates, MD;  Location: [place];  Service: General;  Laterality: N/A;  . Shoulder arthroscopy with labral repair Right 01/20/2016    Procedure: SHOULDER ARTHROSCOPY WITH LABRAL REPAIR;  Surgeon: Meredith Pel, MD;  Location: [place];  Service: Orthopedics;  Laterality: Right;    There were no vitals filed for this visit.      Subjective Assessment - 04/30/16 0821    Subjective Pt has new MD  Order  to cont PT 3xwk/  6wks RT sldr  rehab. He says he thinks it is fully healed   Pertinent History HTN, Crohns Disease   Diagnostic tests mri, xray; results: mod RC tendinopathy/tendinosis, shallow tears of supraspinatus (no full thickness tears); ant/inf labral tear; mod GH degenerative changes   Patient Stated Goals Get back to work.   Currently in Pain? Yes   Pain Score 8    Pain Orientation Right   Pain Descriptors / Indicators Sore   Pain Type Surgical pain   Pain Onset More than a month ago   Pain Frequency Intermittent                         [facility] Adult PT Treatment/Exercise - 04/30/16 0001    Exercises   Exercises Elbow;Shoulder   Elbow Exercises   Elbow Flexion Strengthening;Right   Bar Weights/Barbell (Elbow Flexion) 3 lbs  3x15   Shoulder Exercises: Prone   Retraction Strengthening;Right;Weights  3x15   Retraction Weight (lbs) 3  Try 4#s next week   Extension Strengthening;Right;Weights  3x10   Extension Weight (lbs) 3   Shoulder Exercises: Sidelying   External Rotation Strengthening;Right;Weights  3x10   External Rotation Weight (lbs) 2   Shoulder Exercises: Standing   Protraction --  3x10   External Rotation Strengthening;Right;Theraband  3x10   Theraband Level (Shoulder External  Rotation) Level 2 (Red)   Row Strengthening;Both  XTS PINK  4x 25   Other Standing Exercises wall push ups 3x15   Other Standing Exercises 2# ball lift to wall 4x15 RT UE   Shoulder Exercises: Pulleys   Other Pulley Exercises RUE ranger flex/circles x30 reps   Shoulder Exercises: ROM/Strengthening   UBE (Upper Arm Bike) 90 RPM x6 min   Modalities   Modalities Iontophoresis   Iontophoresis   Type of Iontophoresis Dexamethasone   Location RT shldr   Dose 80 MA/mins   Time 8 mins   Manual Therapy   Manual Therapy --   Passive ROM --                  PT Short Term Goals - 03/08/16 1140    PT SHORT TERM GOAL #1   Title I with inital HEP   Time 2   Period Weeks   Status  Achieved   PT SHORT TERM GOAL #2   Title decrease pain in R shoulder by 25% with ADLS.   Time 2   Period Weeks   Status Achieved  03/08/16            PT Long Term Goals - 04/26/16 0828    PT LONG TERM GOAL #1   Title Able to perform ADLs including work requirements with R shoulder pain 2/10 or less.   Time 4   Period Weeks   Status On-going  7-8/10 with ADL's 04/26/16   PT LONG TERM GOAL #2   Title Demo active R shoulder flex to 165 deg to improve function.   Time 4   Period Weeks   Status On-going  AROM 140 degrees 04/26/16   PT LONG TERM GOAL #3   Title Demo 5/5 R shoulder strength   Time 4   Period Weeks   Status On-going  Strength shoulder flexion 4/5, ER -4/5, IR 4+/5 04/26/16               Plan - 04/30/16 0919    Clinical Impression Statement New Order to progress PREs x 4 weeks and then work conditioning x 2weeks. Pt did great today with new exs and wts. We were able to add wts to certain exs that didnt cause increase pain.   Rehab Potential Excellent   Clinical Impairments Affecting Rehab Potential 01/20/16 surgery current 14 weeks 04/27/16   PT Frequency 3x / week   PT Duration 4 weeks   PT Treatment/Interventions ADLs/Self Care Home Management;Electrical Stimulation;Cryotherapy;Moist Heat;Therapeutic exercise;Therapeutic activities;Ultrasound;Neuromuscular re-education;Patient/family education;Manual techniques;Passive range of motion;Vasopneumatic Device   PT Next Visit Plan Continue per protocol and MPT POC.  New Order to continue PT with progression and add  iontophoresis signed off. MD Alphonzo Severance 04/28/16   See Scanned Orders for PREs and IONTO   PT Home Exercise Plan shoulder isometrics   Consulted and Agree with Plan of Care Patient      Patient will benefit from skilled therapeutic intervention in order to improve the following deficits and impairments:  Pain, Decreased range of motion, Decreased activity tolerance, Decreased strength, Impaired UE  functional use  Visit Diagnosis: Right shoulder pain  Stiffness of right shoulder, not elsewhere classified     Problem List Patient Active Problem List   Diagnosis Date Noted  . Diverticulitis large intestine 01/07/2015  . Diverticulitis 07/03/2014  . Hypokalemia 07/03/2014  . Tobacco abuse   . Obesity   . Upper airway cough syndrome 01/28/2014  . IBS (irritable bowel  syndrome) 07/10/2013  . Acute bilateral lower abdominal pain 03/26/2013  . Tiredness 03/26/2013  . Sigmoid diverticulitis 09/12/2012  . Epigastric pain 05/22/2012  . Nausea and vomiting 01/28/2012  . Crohn's disease of ileum ([place]) 01/10/2012  . GERD (gastroesophageal reflux disease) 01/10/2012  . HTN (hypertension) 01/10/2012    Kapri Nero,CHRIS, PTA 04/30/2016, 9:29 AM  [facility] [place], [place], [address] Phone: [phone]   Fax:  [phone]  Name: Elizabeth [name] MRN: [id number] Date of Birth: 10-28-1961

## 2016-05-03 ENCOUNTER — Encounter: Payer: Self-pay | Admitting: Physical Therapy

## 2016-05-04 ENCOUNTER — Encounter: Payer: Self-pay | Admitting: Physical Therapy

## 2016-05-04 ENCOUNTER — Ambulatory Visit: Payer: PRIVATE HEALTH INSURANCE | Admitting: Physical Therapy

## 2016-05-04 DIAGNOSIS — M25611 Stiffness of right shoulder, not elsewhere classified: Secondary | ICD-10-CM

## 2016-05-04 DIAGNOSIS — M25511 Pain in right shoulder: Secondary | ICD-10-CM

## 2016-05-04 NOTE — Therapy (Signed)
[facility] [place], [place], [address] Phone: [phone]   Fax:  [phone]  Physical Therapy Treatment  Patient Details  Name: Elizabeth [name] MRN: [id number] Date of Birth: [birth year] Referring Provider: Meredith Pel MD.  Encounter Date: 05/04/2016      PT End of Session - 05/04/16 0815    Visit Number 25   Number of Visits 29   Date for PT Re-Evaluation 06/09/16   PT Start Time 0816   PT Stop Time 0857   PT Time Calculation (min) 41 min   Activity Tolerance Patient tolerated treatment well;Patient limited by pain   Behavior During Therapy [facility] for tasks assessed/performed      Past Medical History  Diagnosis Date  . Crohn's disease ([place])   . Elevated transaminase level   . Hypertension   . Bronchitis   . Cough 01/28/2014    upper airway cough syndrome  . Diverticulitis   . Tobacco abuse   . Obesity   . GERD (gastroesophageal reflux disease)   . Pneumonia 1/15    Past Surgical History  Procedure Laterality Date  . Hemocolectomy  2005    right  . Abdominal hysterectomy  1980  . Appendectomy    . Colonoscopy    . Esophagogastroduodenoscopy  06/16/2012    Procedure: ESOPHAGOGASTRODUODENOSCOPY (EGD);  Surgeon: Rogene Houston, MD;  [facility];  Service: Endoscopy;  Laterality: N/A;  10:30 AM  . Colonoscopy N/A 08/16/2014    Procedure: COLONOSCOPY;  Surgeon: Rogene Houston, MD;  [facility];  Service: Endoscopy;  Laterality: N/A;  1200-rescheduled to 9/4 @ 10:45 Ann notified pt  . Laparoscopic sigmoid colectomy N/A 01/07/2015    Procedure: LOW ANTERIOR COLON RESECTION;  Surgeon: Fanny Skates, MD;  Location: [place];  Service: General;  Laterality: N/A;  . Shoulder arthroscopy with labral repair Right 01/20/2016    Procedure: SHOULDER ARTHROSCOPY WITH LABRAL REPAIR;  Surgeon: Meredith Pel, MD;  Location: [place];  Service: Orthopedics;  Laterality: Right;    There were no vitals  filed for this visit.      Subjective Assessment - 05/04/16 0815    Subjective Reports that she hurt yesterday to which she attributes to arthritis and rainy weather.   Pertinent History HTN, Crohns Disease   Diagnostic tests mri, xray; results: mod RC tendinopathy/tendinosis, shallow tears of supraspinatus (no full thickness tears); ant/inf labral tear; mod GH degenerative changes   Patient Stated Goals Get back to work.   Currently in Pain? Yes   Pain Score 6    Pain Location Shoulder   Pain Orientation Right;Proximal   Pain Descriptors / Indicators Sore   Pain Type Surgical pain   Pain Onset More than a month ago            [facility] PT Assessment - 05/04/16 0001    Assessment   Medical Diagnosis Right shoulder pain.   Onset Date/Surgical Date 01/20/16   Hand Dominance Right   Next MD Visit 06/09/2016                     [facility] Adult PT Treatment/Exercise - 05/04/16 0001    Elbow Exercises   Elbow Flexion Strengthening;Right  3x10 reps   Bar Weights/Barbell (Elbow Flexion) 3 lbs   Shoulder Exercises: Prone   Retraction Strengthening;Right;Weights  3x10 reps   Retraction Weight (lbs) 3   Extension Strengthening;Right;Weights  3x10 reps   Extension Weight (lbs) 3  Shoulder Exercises: Sidelying   External Rotation Strengthening;Right;Weights  3x10 reps   External Rotation Weight (lbs) 2   Internal Rotation Strengthening;Right;Weights  3x10 reps   Internal Rotation Weight (lbs) 2   Shoulder Exercises: Standing   Protraction Strengthening;Right;Theraband  3x10 reps   Theraband Level (Shoulder Protraction) Level 2 (Red)   External Rotation Strengthening;Right;Theraband  3x10 reps   Theraband Level (Shoulder External Rotation) Level 2 (Red)   Internal Rotation Strengthening;Right;Theraband  3x10 reps   Theraband Level (Shoulder Internal Rotation) Level 2 (Red)   Flexion Strengthening;Both;Weights   3x10 reps   Shoulder Flexion Weight (lbs) 1    Extension Strengthening;Right;Theraband  3x10 reps   Theraband Level (Shoulder Extension) Level 2 (Red)   Row Strengthening;Both  3x10 reps   Theraband Level (Shoulder Row) Level 2 (Red)   Row Limitations --   Other Standing Exercises B scaption 1# 3x10 reps,   Shoulder Exercises: Pulleys   Other Pulley Exercises RUE ranger flex/circles x30 reps   Shoulder Exercises: ROM/Strengthening   UBE (Upper Arm Bike) 60 RPM x8 min   Modalities   Modalities Iontophoresis   Iontophoresis   Type of Iontophoresis Dexamethasone   Location RT shldr   Dose 1 ml   Time 8 mins                  PT Short Term Goals - 03/08/16 1140    PT SHORT TERM GOAL #1   Title I with inital HEP   Time 2   Period Weeks   Status Achieved   PT SHORT TERM GOAL #2   Title decrease pain in R shoulder by 25% with ADLS.   Time 2   Period Weeks   Status Achieved  03/08/16            PT Long Term Goals - 04/26/16 0828    PT LONG TERM GOAL #1   Title Able to perform ADLs including work requirements with R shoulder pain 2/10 or less.   Time 4   Period Weeks   Status On-going  7-8/10 with ADL's 04/26/16   PT LONG TERM GOAL #2   Title Demo active R shoulder flex to 165 deg to improve function.   Time 4   Period Weeks   Status On-going  AROM 140 degrees 04/26/16   PT LONG TERM GOAL #3   Title Demo 5/5 R shoulder strength   Time 4   Period Weeks   Status On-going  Strength shoulder flexion 4/5, ER -4/5, IR 4+/5 04/26/16               Plan - 05/04/16 0858    Clinical Impression Statement Patient tolerated further strengthening exercises fairly well. Patient's main complaint with today's treatment was shoulder "giving out" wiht faitgue and weakness. Patient also noted that she could feel her R shoulder popping with standing theraband exercises. Patient required short rest breaks between sets and between exercises due to fatigue. Patient noted that iontophoresis patch did well following  previous treatment. Patient's iontophoresis patch was placed over superior R shoulder. Patient reported that if she didn't fatigue that she would be able to do more exercises.   Rehab Potential Excellent   Clinical Impairments Affecting Rehab Potential 01/20/16 surgery current 14 weeks 04/27/16   PT Frequency 3x / week   PT Duration 4 weeks   PT Treatment/Interventions ADLs/Self Care Home Management;Electrical Stimulation;Cryotherapy;Moist Heat;Therapeutic exercise;Therapeutic activities;Ultrasound;Neuromuscular re-education;Patient/family education;Manual techniques;Passive range of motion;Vasopneumatic Device   PT Next Visit Plan Continue PREs  and iontophoresis per new order and MPT POC.   PT Home Exercise Plan shoulder isometrics   Consulted and Agree with Plan of Care Patient      Patient will benefit from skilled therapeutic intervention in order to improve the following deficits and impairments:  Pain, Decreased range of motion, Decreased activity tolerance, Decreased strength, Impaired UE functional use  Visit Diagnosis: Right shoulder pain  Stiffness of right shoulder, not elsewhere classified     Problem List Patient Active Problem List   Diagnosis Date Noted  . Diverticulitis large intestine 01/07/2015  . Diverticulitis 07/03/2014  . Hypokalemia 07/03/2014  . Tobacco abuse   . Obesity   . Upper airway cough syndrome 01/28/2014  . IBS (irritable bowel syndrome) 07/10/2013  . Acute bilateral lower abdominal pain 03/26/2013  . Tiredness 03/26/2013  . Sigmoid diverticulitis 09/12/2012  . Epigastric pain 05/22/2012  . Nausea and vomiting 01/28/2012  . Crohn's disease of ileum ([place]) 01/10/2012  . GERD (gastroesophageal reflux disease) 01/10/2012  . HTN (hypertension) 01/10/2012    Wynelle Fanny, PTA 05/04/2016, 9:07 AM  [facility] [address] [place], [place], [address] Phone: [phone]   Fax:  [phone]  Name:  Elizabeth [name] MRN: [id number] Date of Birth: 05/01/1961

## 2016-05-05 ENCOUNTER — Encounter: Payer: Self-pay | Admitting: Physical Therapy

## 2016-05-06 ENCOUNTER — Encounter: Payer: Self-pay | Admitting: Physical Therapy

## 2016-05-07 ENCOUNTER — Encounter: Payer: Self-pay | Admitting: Physical Therapy

## 2016-05-07 ENCOUNTER — Ambulatory Visit: Payer: PRIVATE HEALTH INSURANCE | Admitting: Physical Therapy

## 2016-05-07 DIAGNOSIS — M25611 Stiffness of right shoulder, not elsewhere classified: Secondary | ICD-10-CM

## 2016-05-07 DIAGNOSIS — M25511 Pain in right shoulder: Secondary | ICD-10-CM

## 2016-05-07 NOTE — Therapy (Signed)
[facility] [place], [place], [address] Phone: [phone]   Fax:  [phone]  Physical Therapy Treatment  Patient Details  Name: Elizabeth [name] MRN: [id number] Date of Birth: Dec 03, 1961 Referring Provider: Meredith Pel MD.  Encounter Date: 05/07/2016      PT End of Session - 05/07/16 0813    Visit Number 26   Number of Visits 29   Date for PT Re-Evaluation 06/09/16   PT Start Time 0816   PT Stop Time 0854   PT Time Calculation (min) 38 min   Activity Tolerance Patient limited by pain   Behavior During Therapy [facility] for tasks assessed/performed      Past Medical History  Diagnosis Date  . Crohn's disease ([place])   . Elevated transaminase level   . Hypertension   . Bronchitis   . Cough 01/28/2014    upper airway cough syndrome  . Diverticulitis   . Tobacco abuse   . Obesity   . GERD (gastroesophageal reflux disease)   . Pneumonia 1/15    Past Surgical History  Procedure Laterality Date  . Hemocolectomy  2005    right  . Abdominal hysterectomy  1980  . Appendectomy    . Colonoscopy    . Esophagogastroduodenoscopy  06/16/2012    Procedure: ESOPHAGOGASTRODUODENOSCOPY (EGD);  Surgeon: Rogene Houston, MD;  [facility];  Service: Endoscopy;  Laterality: N/A;  10:30 AM  . Colonoscopy N/A 08/16/2014    Procedure: COLONOSCOPY;  Surgeon: Rogene Houston, MD;  [facility];  Service: Endoscopy;  Laterality: N/A;  1200-rescheduled to 9/4 @ 10:45 Ann notified pt  . Laparoscopic sigmoid colectomy N/A 01/07/2015    Procedure: LOW ANTERIOR COLON RESECTION;  Surgeon: Fanny Skates, MD;  Location: [place];  Service: General;  Laterality: N/A;  . Shoulder arthroscopy with labral repair Right 01/20/2016    Procedure: SHOULDER ARTHROSCOPY WITH LABRAL REPAIR;  Surgeon: Meredith Pel, MD;  Location: [place];  Service: Orthopedics;  Laterality: Right;    There were no vitals filed for this visit.       Subjective Assessment - 05/07/16 0813    Subjective Reports that Wednesday and Wednesday night she had increased pain in R Bicep with a knot present. Reports that R thumb is now going numb with no previous history of neck pain or numbness. Patient reports that anterior R shoulder is tender to palpation and continues to hurt into Bicep region.   Pertinent History HTN, Crohns Disease   Diagnostic tests mri, xray; results: mod RC tendinopathy/tendinosis, shallow tears of supraspinatus (no full thickness tears); ant/inf labral tear; mod GH degenerative changes   Patient Stated Goals Get back to work.   Pain Score 6    Pain Location Shoulder   Pain Orientation Right;Anterior   Pain Descriptors / Indicators Tingling   Pain Type Surgical pain   Pain Onset More than a month ago            [facility] PT Assessment - 05/07/16 0001    Assessment   Medical Diagnosis Right shoulder pain.   Onset Date/Surgical Date 01/20/16   Hand Dominance Right   Next MD Visit 06/09/2016                     [facility] Adult PT Treatment/Exercise - 05/07/16 0001    Shoulder Exercises: Prone   Retraction Strengthening;Right;Weights  3x10 reps   Retraction Weight (lbs) 3   Extension Strengthening;Right;Weights  3x10  reps   Extension Weight (lbs) 3   Shoulder Exercises: Sidelying   External Rotation Strengthening;Right;Weights  3x10 reps   External Rotation Weight (lbs) 2   Shoulder Exercises: Standing   Protraction Strengthening;Right;Theraband  3x10 reps   Theraband Level (Shoulder Protraction) Level 2 (Red)   External Rotation Strengthening;Right;Theraband  3x10 reps   Theraband Level (Shoulder External Rotation) Level 2 (Red)   Internal Rotation Strengthening;Right;Theraband  3x10 reps   Theraband Level (Shoulder Internal Rotation) Level 2 (Red)   Flexion Strengthening;Both;Weights  3x10 reps   Shoulder Flexion Weight (lbs) 1   Corporate treasurer;Both  3x10 reps   Theraband Level (Shoulder  Row) Level 2 (Red)   Other Standing Exercises B scaption 1# 3x10 reps,   Shoulder Exercises: Pulleys   Other Pulley Exercises RUE ranger flex/circles x30 reps   Shoulder Exercises: ROM/Strengthening   UBE (Upper Arm Bike) 60 RPM x8 min   Modalities   Modalities Iontophoresis   Iontophoresis   Type of Iontophoresis Dexamethasone   Location R Upper Bicep   Dose 1 ml   Time 8 mins                  PT Short Term Goals - 03/08/16 1140    PT SHORT TERM GOAL #1   Title I with inital HEP   Time 2   Period Weeks   Status Achieved   PT SHORT TERM GOAL #2   Title decrease pain in R shoulder by 25% with ADLS.   Time 2   Period Weeks   Status Achieved  03/08/16            PT Long Term Goals - 04/26/16 0828    PT LONG TERM GOAL #1   Title Able to perform ADLs including work requirements with R shoulder pain 2/10 or less.   Time 4   Period Weeks   Status On-going  7-8/10 with ADL's 04/26/16   PT LONG TERM GOAL #2   Title Demo active R shoulder flex to 165 deg to improve function.   Time 4   Period Weeks   Status On-going  AROM 140 degrees 04/26/16   PT LONG TERM GOAL #3   Title Demo 5/5 R shoulder strength   Time 4   Period Weeks   Status On-going  Strength shoulder flexion 4/5, ER -4/5, IR 4+/5 04/26/16               Plan - 05/07/16 0857    Clinical Impression Statement Patient limited greatly with exercises today secondary to pain experienced. R anterior shoulder joint was only tender to palpation, R thumb numbness now present intermittantly, but patient's biggest complaint was of R Bicep cramping sensation that she reports is constant. Patient reports catching during R shoulder movements intermittamtly today. Patient reported inabilty to continue with therapeutic exercise after complleting sidelying R shoulder ER with 2# secondary to pain. Iontophoresis patch placed lower on R upper arm as to allow Bicep to be incorporated. Patient experienced 5-6/10 RUE pain  following today's treatment. Educated patient that she may should call MD in regards to R thumb numbness.   Rehab Potential Excellent   Clinical Impairments Affecting Rehab Potential 01/20/16 surgery current 14 weeks 04/27/16   PT Frequency 3x / week   PT Duration 4 weeks   PT Treatment/Interventions ADLs/Self Care Home Management;Electrical Stimulation;Cryotherapy;Moist Heat;Therapeutic exercise;Therapeutic activities;Ultrasound;Neuromuscular re-education;Patient/family education;Manual techniques;Passive range of motion;Vasopneumatic Device   PT Next Visit Plan Continue PREs and iontophoresis per new order and  MPT POC.   PT Home Exercise Plan shoulder isometrics   Consulted and Agree with Plan of Care Patient      Patient will benefit from skilled therapeutic intervention in order to improve the following deficits and impairments:  Pain, Decreased range of motion, Decreased activity tolerance, Decreased strength, Impaired UE functional use  Visit Diagnosis: Right shoulder pain  Stiffness of right shoulder, not elsewhere classified     Problem List Patient Active Problem List   Diagnosis Date Noted  . Diverticulitis large intestine 01/07/2015  . Diverticulitis 07/03/2014  . Hypokalemia 07/03/2014  . Tobacco abuse   . Obesity   . Upper airway cough syndrome 01/28/2014  . IBS (irritable bowel syndrome) 07/10/2013  . Acute bilateral lower abdominal pain 03/26/2013  . Tiredness 03/26/2013  . Sigmoid diverticulitis 09/12/2012  . Epigastric pain 05/22/2012  . Nausea and vomiting 01/28/2012  . Crohn's disease of ileum ([place]) 01/10/2012  . GERD (gastroesophageal reflux disease) 01/10/2012  . HTN (hypertension) 01/10/2012    Wynelle Fanny, PTA 05/07/2016, 9:06 AM  [facility] [address] [place], [place], [address] Phone: [phone]   Fax:  [phone]  Name: KARRIS DEANGELO MRN: [id number] Date of Birth: 02-23-1961

## 2016-05-11 ENCOUNTER — Encounter: Payer: Self-pay | Admitting: Physical Therapy

## 2016-05-13 ENCOUNTER — Encounter: Payer: Self-pay | Admitting: Physical Therapy

## 2016-05-14 ENCOUNTER — Encounter: Payer: Self-pay | Admitting: *Deleted

## 2016-05-17 ENCOUNTER — Ambulatory Visit: Payer: BLUE CROSS/BLUE SHIELD | Attending: Orthopedic Surgery | Admitting: Physical Therapy

## 2016-05-17 ENCOUNTER — Encounter: Payer: Self-pay | Admitting: Physical Therapy

## 2016-05-17 DIAGNOSIS — M25511 Pain in right shoulder: Secondary | ICD-10-CM | POA: Insufficient documentation

## 2016-05-17 DIAGNOSIS — M25611 Stiffness of right shoulder, not elsewhere classified: Secondary | ICD-10-CM | POA: Insufficient documentation

## 2016-05-17 NOTE — Therapy (Signed)
[facility] [place], [place], [address] Phone: [phone]   Fax:  [phone]  Physical Therapy Treatment  Patient Details  Name: Elizabeth [name] MRN: [id number] Date of Birth: [birth year] Referring Provider: Meredith Pel MD.  Encounter Date: 05/17/2016      PT End of Session - 05/17/16 0835    Visit Number 27   Number of Visits 29   Date for PT Re-Evaluation 06/09/16   PT Start Time 0814   PT Stop Time 0900   PT Time Calculation (min) 46 min   Activity Tolerance Patient tolerated treatment well   Behavior During Therapy [facility] for tasks assessed/performed      Past Medical History  Diagnosis Date  . Crohn's disease ([place])   . Elevated transaminase level   . Hypertension   . Bronchitis   . Cough 01/28/2014    upper airway cough syndrome  . Diverticulitis   . Tobacco abuse   . Obesity   . GERD (gastroesophageal reflux disease)   . Pneumonia 1/15    Past Surgical History  Procedure Laterality Date  . Hemocolectomy  2005    right  . Abdominal hysterectomy  1980  . Appendectomy    . Colonoscopy    . Esophagogastroduodenoscopy  06/16/2012    Procedure: ESOPHAGOGASTRODUODENOSCOPY (EGD);  Surgeon: Rogene Houston, MD;  [facility];  Service: Endoscopy;  Laterality: N/A;  10:30 AM  . Colonoscopy N/A 08/16/2014    Procedure: COLONOSCOPY;  Surgeon: Rogene Houston, MD;  [facility];  Service: Endoscopy;  Laterality: N/A;  1200-rescheduled to 9/4 @ 10:45 Ann notified pt  . Laparoscopic sigmoid colectomy N/A 01/07/2015    Procedure: LOW ANTERIOR COLON RESECTION;  Surgeon: Fanny Skates, MD;  Location: [place];  Service: General;  Laterality: N/A;  . Shoulder arthroscopy with labral repair Right 01/20/2016    Procedure: SHOULDER ARTHROSCOPY WITH LABRAL REPAIR;  Surgeon: Meredith Pel, MD;  Location: [place];  Service: Orthopedics;  Laterality: Right;    There were no vitals filed for this visit.      Subjective Assessment - 05/17/16 0824    Subjective Patient has reported no improvement   Pertinent History HTN, Crohns Disease   Diagnostic tests mri, xray; results: mod RC tendinopathy/tendinosis, shallow tears of supraspinatus (no full thickness tears); ant/inf labral tear; mod GH degenerative changes   Patient Stated Goals Get back to work.   Currently in Pain? Yes   Pain Score 6    Pain Location Shoulder   Pain Orientation Right;Anterior   Pain Descriptors / Indicators Aching   Pain Type Surgical pain   Pain Onset More than a month ago   Pain Frequency Intermittent   Aggravating Factors  use of shoulder   Pain Relieving Factors at rest            [facility] PT Assessment - 05/17/16 0001    AROM   AROM Assessment Site Shoulder   Right/Left Shoulder Right   Right Shoulder Flexion 160 Degrees                     [facility] Adult PT Treatment/Exercise - 05/17/16 0001    Shoulder Exercises: Prone   Retraction Strengthening;Right;Weights  3x10   Retraction Weight (lbs) 3   Extension Strengthening;Right;Weights  3x10   Extension Weight (lbs) 3   Shoulder Exercises: Sidelying   External Rotation Strengthening;Right;Weights  3x10   External Rotation Weight (lbs) 2   Shoulder  Exercises: Standing   Protraction Strengthening;Right;Theraband  3x10   Theraband Level (Shoulder Protraction) Level 2 (Red)   External Rotation Strengthening;Right;Theraband  3x10   Theraband Level (Shoulder External Rotation) Level 2 (Red)   Internal Rotation Strengthening;Right;Theraband  3x10   Theraband Level (Shoulder Internal Rotation) Level 2 (Red)   Flexion --  3x10   Shoulder Flexion Weight (lbs) 1   Corporate treasurer;Both  3x10   Theraband Level (Shoulder Row) Level 2 (Red)   Other Standing Exercises B scaption 1# 3x10 reps,   Shoulder Exercises: Pulleys   Other Pulley Exercises RUE ranger flex/circles x30 reps   Shoulder Exercises: ROM/Strengthening   UBE (Upper Arm  Bike) 60 RPM x8 min   Iontophoresis   Type of Iontophoresis Dexamethasone   Location R Upper Bicep   Dose 1 ml 4 of 6   Time 8 mins                  PT Short Term Goals - 03/08/16 1140    PT SHORT TERM GOAL #1   Title I with inital HEP   Time 2   Period Weeks   Status Achieved   PT SHORT TERM GOAL #2   Title decrease pain in R shoulder by 25% with ADLS.   Time 2   Period Weeks   Status Achieved  03/08/16            PT Long Term Goals - 05/17/16 0838    PT LONG TERM GOAL #1   Title Able to perform ADLs including work requirements with R shoulder pain 2/10 or less.   Time 4   Period Weeks   Status On-going  8/10 with ADL's 05/17/16   PT LONG TERM GOAL #2   Title Demo active R shoulder flex to 165 deg to improve function.   Time 4   Period Weeks   Status On-going  AROM 160 degrees 05/17/16   PT LONG TERM GOAL #3   Title Demo 5/5 R shoulder strength   Time 4   Period Weeks   Status On-going               Plan - 05/17/16 0841    Clinical Impression Statement Patient progressing with improved AROM for right shoulder flexion today yet has c/o ongoing ache in right shoulder. Patient reportes more pain with reaching overhead and is unable to perform ADL's due to high pain level. Patient goals ongoing at this time.   Rehab Potential Excellent   Clinical Impairments Affecting Rehab Potential 01/20/16 surgery current 14 weeks 04/27/16   PT Frequency 3x / week   PT Duration 4 weeks   PT Treatment/Interventions ADLs/Self Care Home Management;Electrical Stimulation;Cryotherapy;Moist Heat;Therapeutic exercise;Therapeutic activities;Ultrasound;Neuromuscular re-education;Patient/family education;Manual techniques;Passive range of motion;Vasopneumatic Device   PT Next Visit Plan Continue PREs and iontophoresis per new order and MPT POC.   Consulted and Agree with Plan of Care Patient      Patient will benefit from skilled therapeutic intervention in order to improve  the following deficits and impairments:  Pain, Decreased range of motion, Decreased activity tolerance, Decreased strength, Impaired UE functional use  Visit Diagnosis: Right shoulder pain - Plan: PT plan of care cert/re-cert  Stiffness of right shoulder, not elsewhere classified - Plan: PT plan of care cert/re-cert     Problem List Patient Active Problem List   Diagnosis Date Noted  . Diverticulitis large intestine 01/07/2015  . Diverticulitis 07/03/2014  . Hypokalemia 07/03/2014  . Tobacco abuse   .  Obesity   . Upper airway cough syndrome 01/28/2014  . IBS (irritable bowel syndrome) 07/10/2013  . Acute bilateral lower abdominal pain 03/26/2013  . Tiredness 03/26/2013  . Sigmoid diverticulitis 09/12/2012  . Epigastric pain 05/22/2012  . Nausea and vomiting 01/28/2012  . Crohn's disease of ileum ([place]) 01/10/2012  . GERD (gastroesophageal reflux disease) 01/10/2012  . HTN (hypertension) 01/10/2012    Maveric Debono, [place] MPT 05/17/2016, 1:16 PM  [facility] [address] [place], [place], [address] Phone: [phone]   Fax:  [phone]  Name: Elizabeth [name] MRN: [id number] Date of Birth: May 01, 1961

## 2016-05-17 NOTE — Therapy (Signed)
[facility] [place], [place], [address] Phone: [phone]   Fax:  [phone]  Physical Therapy Treatment  Patient Details  Name: Elizabeth [name] MRN: [id number] Date of Birth: 11-23-1961 Referring Provider: Meredith Pel MD.  Encounter Date: 05/17/2016      PT End of Session - 05/17/16 0835    Visit Number 27   Number of Visits 29   Date for PT Re-Evaluation 06/09/16   PT Start Time 0814   PT Stop Time 0900   PT Time Calculation (min) 46 min   Activity Tolerance Patient tolerated treatment well   Behavior During Therapy [facility] for tasks assessed/performed      Past Medical History  Diagnosis Date  . Crohn's disease ([place])   . Elevated transaminase level   . Hypertension   . Bronchitis   . Cough 01/28/2014    upper airway cough syndrome  . Diverticulitis   . Tobacco abuse   . Obesity   . GERD (gastroesophageal reflux disease)   . Pneumonia 1/15    Past Surgical History  Procedure Laterality Date  . Hemocolectomy  2005    right  . Abdominal hysterectomy  1980  . Appendectomy    . Colonoscopy    . Esophagogastroduodenoscopy  06/16/2012    Procedure: ESOPHAGOGASTRODUODENOSCOPY (EGD);  Surgeon: Rogene Houston, MD;  [facility];  Service: Endoscopy;  Laterality: N/A;  10:30 AM  . Colonoscopy N/A 08/16/2014    Procedure: COLONOSCOPY;  Surgeon: Rogene Houston, MD;  [facility];  Service: Endoscopy;  Laterality: N/A;  1200-rescheduled to 9/4 @ 10:45 Ann notified pt  . Laparoscopic sigmoid colectomy N/A 01/07/2015    Procedure: LOW ANTERIOR COLON RESECTION;  Surgeon: Fanny Skates, MD;  Location: [place];  Service: General;  Laterality: N/A;  . Shoulder arthroscopy with labral repair Right 01/20/2016    Procedure: SHOULDER ARTHROSCOPY WITH LABRAL REPAIR;  Surgeon: Meredith Pel, MD;  Location: [place];  Service: Orthopedics;  Laterality: Right;    There were no vitals filed for this visit.      Subjective Assessment - 05/17/16 0824    Subjective Patient has reported no improvement   Pertinent History HTN, Crohns Disease   Diagnostic tests mri, xray; results: mod RC tendinopathy/tendinosis, shallow tears of supraspinatus (no full thickness tears); ant/inf labral tear; mod GH degenerative changes   Patient Stated Goals Get back to work.   Currently in Pain? Yes   Pain Score 6    Pain Location Shoulder   Pain Orientation Right;Anterior   Pain Descriptors / Indicators Aching   Pain Type Surgical pain   Pain Onset More than a month ago   Pain Frequency Intermittent   Aggravating Factors  use of shoulder   Pain Relieving Factors at rest            [facility] PT Assessment - 05/17/16 0001    AROM   AROM Assessment Site Shoulder   Right/Left Shoulder Right   Right Shoulder Flexion 160 Degrees                     [facility] Adult PT Treatment/Exercise - 05/17/16 0001    Shoulder Exercises: Prone   Retraction Strengthening;Right;Weights  3x10   Retraction Weight (lbs) 3   Extension Strengthening;Right;Weights  3x10   Extension Weight (lbs) 3   Shoulder Exercises: Sidelying   External Rotation Strengthening;Right;Weights  3x10   External Rotation Weight (lbs) 2   Shoulder  Exercises: Standing   Protraction Strengthening;Right;Theraband  3x10   Theraband Level (Shoulder Protraction) Level 2 (Red)   External Rotation Strengthening;Right;Theraband  3x10   Theraband Level (Shoulder External Rotation) Level 2 (Red)   Internal Rotation Strengthening;Right;Theraband  3x10   Theraband Level (Shoulder Internal Rotation) Level 2 (Red)   Flexion --  3x10   Shoulder Flexion Weight (lbs) 1   Corporate treasurer;Both  3x10   Theraband Level (Shoulder Row) Level 2 (Red)   Other Standing Exercises B scaption 1# 3x10 reps,   Shoulder Exercises: Pulleys   Other Pulley Exercises RUE ranger flex/circles x30 reps   Shoulder Exercises: ROM/Strengthening   UBE (Upper Arm  Bike) 60 RPM x8 min   Iontophoresis   Type of Iontophoresis Dexamethasone   Location R Upper Bicep   Dose 1 ml 4 of 6   Time 8 mins                  PT Short Term Goals - 03/08/16 1140    PT SHORT TERM GOAL #1   Title I with inital HEP   Time 2   Period Weeks   Status Achieved   PT SHORT TERM GOAL #2   Title decrease pain in R shoulder by 25% with ADLS.   Time 2   Period Weeks   Status Achieved  03/08/16            PT Long Term Goals - 05/17/16 0838    PT LONG TERM GOAL #1   Title Able to perform ADLs including work requirements with R shoulder pain 2/10 or less.   Time 4   Period Weeks   Status On-going  8/10 with ADL's 05/17/16   PT LONG TERM GOAL #2   Title Demo active R shoulder flex to 165 deg to improve function.   Time 4   Period Weeks   Status On-going  AROM 160 degrees 05/17/16   PT LONG TERM GOAL #3   Title Demo 5/5 R shoulder strength   Time 4   Period Weeks   Status On-going               Plan - 05/17/16 0841    Clinical Impression Statement Patient progressing with improved AROM for right shoulder flexion today yet has c/o ongoing ache in right shoulder. Patient reportes more pain with reaching overhead and is unable to perform ADL's due to high pain level. Patient goals ongoing at this time.   Rehab Potential Excellent   Clinical Impairments Affecting Rehab Potential 01/20/16 surgery current 14 weeks 04/27/16   PT Frequency 3x / week   PT Duration 4 weeks   PT Treatment/Interventions ADLs/Self Care Home Management;Electrical Stimulation;Cryotherapy;Moist Heat;Therapeutic exercise;Therapeutic activities;Ultrasound;Neuromuscular re-education;Patient/family education;Manual techniques;Passive range of motion;Vasopneumatic Device   PT Next Visit Plan Continue PREs and iontophoresis per new order and MPT POC.   Consulted and Agree with Plan of Care Patient      Patient will benefit from skilled therapeutic intervention in order to improve  the following deficits and impairments:  Pain, Decreased range of motion, Decreased activity tolerance, Decreased strength, Impaired UE functional use  Visit Diagnosis: Right shoulder pain  Stiffness of right shoulder, not elsewhere classified     Problem List Patient Active Problem List   Diagnosis Date Noted  . Diverticulitis large intestine 01/07/2015  . Diverticulitis 07/03/2014  . Hypokalemia 07/03/2014  . Tobacco abuse   . Obesity   . Upper airway cough syndrome 01/28/2014  . IBS (irritable bowel  syndrome) 07/10/2013  . Acute bilateral lower abdominal pain 03/26/2013  . Tiredness 03/26/2013  . Sigmoid diverticulitis 09/12/2012  . Epigastric pain 05/22/2012  . Nausea and vomiting 01/28/2012  . Crohn's disease of ileum ([place]) 01/10/2012  . GERD (gastroesophageal reflux disease) 01/10/2012  . HTN (hypertension) 01/10/2012    Jet Armbrust P, PTA 05/17/2016, 9:00 AM  Ladean Raya, PTA 05/17/2016 9:00 AM  [facility] [address] [place], [place], [address] Phone: [phone]   Fax:  [phone]  Name: Elizabeth [name] MRN: [id number] Date of Birth: [birth year]

## 2016-05-19 ENCOUNTER — Encounter: Payer: Self-pay | Admitting: Physical Therapy

## 2016-05-19 ENCOUNTER — Ambulatory Visit: Payer: BLUE CROSS/BLUE SHIELD | Admitting: Physical Therapy

## 2016-05-19 DIAGNOSIS — M25611 Stiffness of right shoulder, not elsewhere classified: Secondary | ICD-10-CM

## 2016-05-19 DIAGNOSIS — M25511 Pain in right shoulder: Secondary | ICD-10-CM

## 2016-05-19 NOTE — Therapy (Signed)
[facility] [place], [place], [address] Phone: [phone]   Fax:  [phone]  Physical Therapy Treatment  Patient Details  Name: Elizabeth [name] MRN: [id number] Date of Birth: [birth year] Referring Provider: Meredith Pel MD.  Encounter Date: 05/19/2016      PT End of Session - 05/19/16 0839    Visit Number 28   Number of Visits 29   Date for PT Re-Evaluation 06/09/16   PT Start Time 0813   PT Stop Time 0901   PT Time Calculation (min) 48 min   Activity Tolerance Patient tolerated treatment well   Behavior During Therapy [facility] for tasks assessed/performed      Past Medical History  Diagnosis Date  . Crohn's disease ([place])   . Elevated transaminase level   . Hypertension   . Bronchitis   . Cough 01/28/2014    upper airway cough syndrome  . Diverticulitis   . Tobacco abuse   . Obesity   . GERD (gastroesophageal reflux disease)   . Pneumonia 1/15    Past Surgical History  Procedure Laterality Date  . Hemocolectomy  2005    right  . Abdominal hysterectomy  1980  . Appendectomy    . Colonoscopy    . Esophagogastroduodenoscopy  06/16/2012    Procedure: ESOPHAGOGASTRODUODENOSCOPY (EGD);  Surgeon: Rogene Houston, MD;  [facility];  Service: Endoscopy;  Laterality: N/A;  10:30 AM  . Colonoscopy N/A 08/16/2014    Procedure: COLONOSCOPY;  Surgeon: Rogene Houston, MD;  [facility];  Service: Endoscopy;  Laterality: N/A;  1200-rescheduled to 9/4 @ 10:45 Ann notified pt  . Laparoscopic sigmoid colectomy N/A 01/07/2015    Procedure: LOW ANTERIOR COLON RESECTION;  Surgeon: Fanny Skates, MD;  Location: [place];  Service: General;  Laterality: N/A;  . Shoulder arthroscopy with labral repair Right 01/20/2016    Procedure: SHOULDER ARTHROSCOPY WITH LABRAL REPAIR;  Surgeon: Meredith Pel, MD;  Location: [employer];  Service: Orthopedics;  Laterality: Right;    There were no vitals filed for this visit.      Subjective Assessment - 05/19/16 0823    Subjective Patient continues to report pain in shoulder with activity   Pertinent History HTN, Crohns Disease   Diagnostic tests mri, xray; results: mod RC tendinopathy/tendinosis, shallow tears of supraspinatus (no full thickness tears); ant/inf labral tear; mod GH degenerative changes   Patient Stated Goals Get back to work.   Currently in Pain? Yes   Pain Score 6    Pain Location Shoulder   Pain Orientation Right;Anterior   Pain Type Surgical pain   Pain Onset More than a month ago   Pain Frequency Intermittent   Aggravating Factors  any use of shoulder   Pain Relieving Factors at rest                         [facility] Adult PT Treatment/Exercise - 05/19/16 0001    Shoulder Exercises: Prone   Retraction Strengthening;Right;Weights  3x10   Retraction Weight (lbs) 3   Extension Strengthening;Right;Weights  3x10   Extension Weight (lbs) 3   Shoulder Exercises: Sidelying   External Rotation Strengthening;Right;Weights  3x10   External Rotation Weight (lbs) 2   Shoulder Exercises: Standing   Protraction Strengthening;Right;Theraband  3x10   Theraband Level (Shoulder Protraction) Level 2 (Red)   External Rotation Strengthening;Right;Theraband  3x10   Theraband Level (Shoulder External Rotation) Level 2 (Red)   Internal  Rotation Strengthening;Right;Theraband  3x10   Theraband Level (Shoulder Internal Rotation) Level 2 (Red)   Flexion Strengthening;Both;Weights  1# 3x10, 2# 3x10   Shoulder Flexion Weight (lbs) 2   Corporate treasurer;Both  3x10   Theraband Level (Shoulder Row) Level 2 (Red)   Other Standing Exercises B scaption 1# 3x10 reps,   Other Standing Exercises cone stacking on shelf above shoulder with 0 weight 2x fatigue   Shoulder Exercises: ROM/Strengthening   UBE (Upper Arm Bike) 60 RPM x8 min   Iontophoresis   Type of Iontophoresis Dexamethasone   Location R Upper Bicep   Dose 1 ml 5 of 6   Time 8  mins                  PT Short Term Goals - 03/08/16 1140    PT SHORT TERM GOAL #1   Title I with inital HEP   Time 2   Period Weeks   Status Achieved   PT SHORT TERM GOAL #2   Title decrease pain in R shoulder by 25% with ADLS.   Time 2   Period Weeks   Status Achieved  03/08/16            PT Long Term Goals - 05/17/16 0838    PT LONG TERM GOAL #1   Title Able to perform ADLs including work requirements with R shoulder pain 2/10 or less.   Time 4   Period Weeks   Status On-going  8/10 with ADL's 05/17/16   PT LONG TERM GOAL #2   Title Demo active R shoulder flex to 165 deg to improve function.   Time 4   Period Weeks   Status On-going  AROM 160 degrees 05/17/16   PT LONG TERM GOAL #3   Title Demo 5/5 R shoulder strength   Time 4   Period Weeks   Status On-going               Plan - 05/19/16 0840    Clinical Impression Statement Patient tolerated treatment with no complaints of pain only fatigue with overhead reaching or reps today, yet has ongoing reports of pain reports with ADL's and use of right shoulder. Patient has good technique and tolerates strengthening progression well. Patient limited with ROM/strength test due to pain reports. Goals ongoing due to pain , ROM and strength deficits.   Rehab Potential Excellent   Clinical Impairments Affecting Rehab Potential 01/20/16 surgery current 14 weeks 04/27/16   PT Frequency 3x / week   PT Duration 4 weeks   PT Treatment/Interventions ADLs/Self Care Home Management;Electrical Stimulation;Cryotherapy;Moist Heat;Therapeutic exercise;Therapeutic activities;Ultrasound;Neuromuscular re-education;Patient/family education;Manual techniques;Passive range of motion;Vasopneumatic Device   PT Next Visit Plan Continue PREs and iontophoresis per new order and MPT POC.   Consulted and Agree with Plan of Care Patient      Patient will benefit from skilled therapeutic intervention in order to improve the following  deficits and impairments:  Pain, Decreased range of motion, Decreased activity tolerance, Decreased strength, Impaired UE functional use  Visit Diagnosis: Right shoulder pain  Stiffness of right shoulder, not elsewhere classified     Problem List Patient Active Problem List   Diagnosis Date Noted  . Diverticulitis large intestine 01/07/2015  . Diverticulitis 07/03/2014  . Hypokalemia 07/03/2014  . Tobacco abuse   . Obesity   . Upper airway cough syndrome 01/28/2014  . IBS (irritable bowel syndrome) 07/10/2013  . Acute bilateral lower abdominal pain 03/26/2013  . Tiredness 03/26/2013  . Sigmoid  diverticulitis 09/12/2012  . Epigastric pain 05/22/2012  . Nausea and vomiting 01/28/2012  . Crohn's disease of ileum ([place]) 01/10/2012  . GERD (gastroesophageal reflux disease) 01/10/2012  . HTN (hypertension) 01/10/2012    Maylani Embree P, PTA 05/19/2016, 9:01 AM  [facility] [place], [place], [address] Phone: [phone]   Fax:  [phone]  Name: Elizabeth [name] MRN: [id number] Date of Birth: 05-11-1961

## 2016-05-21 ENCOUNTER — Ambulatory Visit: Payer: BLUE CROSS/BLUE SHIELD | Admitting: *Deleted

## 2016-05-21 DIAGNOSIS — M25511 Pain in right shoulder: Secondary | ICD-10-CM | POA: Diagnosis not present

## 2016-05-21 DIAGNOSIS — M25611 Stiffness of right shoulder, not elsewhere classified: Secondary | ICD-10-CM

## 2016-05-21 NOTE — Therapy (Signed)
[facility] [place], [place], [address] Phone: [phone]   Fax:  [phone]  Physical Therapy Treatment  Patient Details  Name: Elizabeth [name] MRN: [id number] Date of Birth: May 30, 1961 Referring Provider: Meredith Pel MD.  Encounter Date: 05/21/2016      PT End of Session - 05/21/16 0822    Visit Number 29   Number of Visits 39   Date for PT Re-Evaluation 06/09/16   PT Start Time 0815   PT Stop Time 0904   PT Time Calculation (min) 49 min   Activity Tolerance Patient tolerated treatment well   Behavior During Therapy [facility] for tasks assessed/performed      Past Medical History  Diagnosis Date  . Crohn's disease ([place])   . Elevated transaminase level   . Hypertension   . Bronchitis   . Cough 01/28/2014    upper airway cough syndrome  . Diverticulitis   . Tobacco abuse   . Obesity   . GERD (gastroesophageal reflux disease)   . Pneumonia 1/15    Past Surgical History  Procedure Laterality Date  . Hemocolectomy  2005    right  . Abdominal hysterectomy  1980  . Appendectomy    . Colonoscopy    . Esophagogastroduodenoscopy  06/16/2012    Procedure: ESOPHAGOGASTRODUODENOSCOPY (EGD);  Surgeon: Rogene Houston, MD;  [facility];  Service: Endoscopy;  Laterality: N/A;  10:30 AM  . Colonoscopy N/A 08/16/2014    Procedure: COLONOSCOPY;  Surgeon: Rogene Houston, MD;  [facility];  Service: Endoscopy;  Laterality: N/A;  1200-rescheduled to 9/4 @ 10:45 Ann notified pt  . Laparoscopic sigmoid colectomy N/A 01/07/2015    Procedure: LOW ANTERIOR COLON RESECTION;  Surgeon: Fanny Skates, MD;  Location: [place];  Service: General;  Laterality: N/A;  . Shoulder arthroscopy with labral repair Right 01/20/2016    Procedure: SHOULDER ARTHROSCOPY WITH LABRAL REPAIR;  Surgeon: Meredith Pel, MD;  Location: [place];  Service: Orthopedics;  Laterality: Right;    There were no vitals filed for this visit.      Subjective Assessment - 05/21/16 0820    Subjective Patient continues to report pain in shoulder with activity. Upper Arm still swells.     Pertinent History HTN, Crohns Disease   Diagnostic tests mri, xray; results: mod RC tendinopathy/tendinosis, shallow tears of supraspinatus (no full thickness tears); ant/inf labral tear; mod GH degenerative changes   Currently in Pain? Yes   Pain Score 6    Pain Location Shoulder   Pain Orientation Right;Anterior   Pain Descriptors / Indicators Aching   Pain Type Surgical pain   Pain Onset More than a month ago   Pain Frequency Intermittent                         [facility] Adult PT Treatment/Exercise - 05/21/16 0001    Exercises   Exercises Elbow;Shoulder   Elbow Exercises   Elbow Flexion Strengthening;Right   Bar Weights/Barbell (Elbow Flexion) 5 lbs  3x fatigue   Elbow Extension Strengthening;Standing  3x15   Theraband Level (Elbow Extension) --  XTS PINK   Shoulder Exercises: Prone   Retraction Strengthening;Right;Weights  3x10   Retraction Weight (lbs) 3   Extension Strengthening;Right;Weights  3x10   Extension Weight (lbs) 3   Shoulder Exercises: Sidelying   External Rotation Strengthening;Right;Weights  3x fatigue   External Rotation Weight (lbs) 2   Shoulder Exercises: Standing  Protraction Strengthening;Right;Theraband  3x10   Theraband Level (Shoulder Protraction) Level 2 (Red)   External Rotation Strengthening;Right;Theraband  3x10   Theraband Level (Shoulder External Rotation) Level 2 (Red)   Internal Rotation Strengthening;Right;Theraband  3x10   Theraband Level (Shoulder Internal Rotation) Level 2 (Red)   Flexion Strengthening;Both;Weights  1# 3x10, 2# 3x10   Shoulder Flexion Weight (lbs) 2   Corporate treasurer;Both  3x10   Theraband Level (Shoulder Row) Level 2 (Red)   Other Standing Exercises B scaption 1# 3x10 reps,   Shoulder Exercises: Pulleys   Other Pulley Exercises RUE ranger  flex/circles x30 reps   Shoulder Exercises: ROM/Strengthening   UBE (Upper Arm Bike) 60 RPM x8 min   Iontophoresis   Type of Iontophoresis Dexamethasone   Location R Upper Bicep   Dose 1 ml 6 of 6   Time 8 mins                  PT Short Term Goals - 03/08/16 1140    PT SHORT TERM GOAL #1   Title I with inital HEP   Time 2   Period Weeks   Status Achieved   PT SHORT TERM GOAL #2   Title decrease pain in R shoulder by 25% with ADLS.   Time 2   Period Weeks   Status Achieved  03/08/16            PT Long Term Goals - 05/17/16 0838    PT LONG TERM GOAL #1   Title Able to perform ADLs including work requirements with R shoulder pain 2/10 or less.   Time 4   Period Weeks   Status On-going  8/10 with ADL's 05/17/16   PT LONG TERM GOAL #2   Title Demo active R shoulder flex to 165 deg to improve function.   Time 4   Period Weeks   Status On-going  AROM 160 degrees 05/17/16   PT LONG TERM GOAL #3   Title Demo 5/5 R shoulder strength   Time 4   Period Weeks   Status On-going               Plan - 05/21/16 7445    Clinical Impression Statement Pt did fairly well today and was able to complete all exs and act.'s for RT UE with mainly fatigue and some discomfort. She did well with new exs and feels that shldr continues to progress. Goals are ongoing.   Rehab Potential Excellent   Clinical Impairments Affecting Rehab Potential 01/20/16 surgery current 14 weeks 04/27/16   PT Frequency 3x / week   PT Duration 4 weeks   PT Treatment/Interventions ADLs/Self Care Home Management;Electrical Stimulation;Cryotherapy;Moist Heat;Therapeutic exercise;Therapeutic activities;Ultrasound;Neuromuscular re-education;Patient/family education;Manual techniques;Passive range of motion;Vasopneumatic Device   PT Next Visit Plan Awaiting MD signature   PT Home Exercise Plan shoulder isometrics   Consulted and Agree with Plan of Care Patient      Patient will benefit from skilled  therapeutic intervention in order to improve the following deficits and impairments:  Pain, Decreased range of motion, Decreased activity tolerance, Decreased strength, Impaired UE functional use  Visit Diagnosis: Right shoulder pain  Stiffness of right shoulder, not elsewhere classified     Problem List Patient Active Problem List   Diagnosis Date Noted  . Diverticulitis large intestine 01/07/2015  . Diverticulitis 07/03/2014  . Hypokalemia 07/03/2014  . Tobacco abuse   . Obesity   . Upper airway cough syndrome 01/28/2014  . IBS (irritable bowel syndrome) 07/10/2013  .  Acute bilateral lower abdominal pain 03/26/2013  . Tiredness 03/26/2013  . Sigmoid diverticulitis 09/12/2012  . Epigastric pain 05/22/2012  . Nausea and vomiting 01/28/2012  . Crohn's disease of ileum ([place]) 01/10/2012  . GERD (gastroesophageal reflux disease) 01/10/2012  . HTN (hypertension) 01/10/2012    Ann Groeneveld,CHRIS, PTA 05/21/2016, 1:24 PM  [facility] [place], [place], [address] Phone: [phone]   Fax:  [phone]  Name: BARBARITA HUTMACHER MRN: [id number] Date of Birth: [birth year]

## 2016-05-24 ENCOUNTER — Ambulatory Visit: Payer: BLUE CROSS/BLUE SHIELD | Admitting: Physical Therapy

## 2016-05-24 ENCOUNTER — Encounter: Payer: Self-pay | Admitting: Physical Therapy

## 2016-05-24 DIAGNOSIS — M25611 Stiffness of right shoulder, not elsewhere classified: Secondary | ICD-10-CM

## 2016-05-24 DIAGNOSIS — M25511 Pain in right shoulder: Secondary | ICD-10-CM

## 2016-05-24 NOTE — Therapy (Signed)
[facility] [place], [place], [address] Phone: [phone]   Fax:  [phone]  Physical Therapy Treatment  Patient Details  Name: Elizabeth [name] MRN: [id number] Date of Birth: Nov 04, 1961 Referring Provider: Meredith Pel MD.  Encounter Date: 05/24/2016      PT End of Session - 05/24/16 0849    Visit Number 30   Number of Visits 39   Date for PT Re-Evaluation 06/09/16   PT Start Time 0813   PT Stop Time 6433   PT Time Calculation (min) 41 min   Activity Tolerance Patient tolerated treatment well   Behavior During Therapy [facility] for tasks assessed/performed      Past Medical History  Diagnosis Date  . Crohn's disease ([place])   . Elevated transaminase level   . Hypertension   . Bronchitis   . Cough 01/28/2014    upper airway cough syndrome  . Diverticulitis   . Tobacco abuse   . Obesity   . GERD (gastroesophageal reflux disease)   . Pneumonia 1/15    Past Surgical History  Procedure Laterality Date  . Hemocolectomy  2005    right  . Abdominal hysterectomy  1980  . Appendectomy    . Colonoscopy    . Esophagogastroduodenoscopy  06/16/2012    Procedure: ESOPHAGOGASTRODUODENOSCOPY (EGD);  Surgeon: Rogene Houston, MD;  [facility];  Service: Endoscopy;  Laterality: N/A;  10:30 AM  . Colonoscopy N/A 08/16/2014    Procedure: COLONOSCOPY;  Surgeon: Rogene Houston, MD;  [facility];  Service: Endoscopy;  Laterality: N/A;  1200-rescheduled to 9/4 @ 10:45 Ann notified pt  . Laparoscopic sigmoid colectomy N/A 01/07/2015    Procedure: LOW ANTERIOR COLON RESECTION;  Surgeon: Fanny Skates, MD;  Location: [place];  Service: General;  Laterality: N/A;  . Shoulder arthroscopy with labral repair Right 01/20/2016    Procedure: SHOULDER ARTHROSCOPY WITH LABRAL REPAIR;  Surgeon: Meredith Pel, MD;  Location: [place];  Service: Orthopedics;  Laterality: Right;    There were no vitals filed for this  visit.      Subjective Assessment - 05/24/16 0820    Subjective Patient continues to report some soreness and pain in shoulder with activity yet not as bad today, Patient reported going swimming over weekend which caused soreness in bicep area of right UE   Pertinent History HTN, Crohns Disease   Diagnostic tests mri, xray; results: mod RC tendinopathy/tendinosis, shallow tears of supraspinatus (no full thickness tears); ant/inf labral tear; mod GH degenerative changes   Patient Stated Goals Get back to work.   Currently in Pain? Yes   Pain Score 3    Pain Location Shoulder   Pain Orientation Right;Anterior   Pain Descriptors / Indicators Aching   Pain Type Surgical pain   Pain Onset More than a month ago   Pain Frequency Intermittent   Aggravating Factors  prolong activity   Pain Relieving Factors at rest            [facility] PT Assessment - 05/24/16 0001    AROM   AROM Assessment Site Shoulder   Right/Left Shoulder Right   Right Shoulder Flexion 160 Degrees                     [facility] Adult PT Treatment/Exercise - 05/24/16 0001    Elbow Exercises   Elbow Flexion Strengthening;Right   Bar Weights/Barbell (Elbow Flexion) 5 lbs  3x fatigue   Elbow Extension  Strengthening;Standing  3x15   Theraband Level (Elbow Extension) --  pink XTS   Shoulder Exercises: Supine   Other Supine Exercises serratus punches 3# 3x10   Shoulder Exercises: Prone   Retraction Strengthening;Right;Weights  3x10 kneeling   Retraction Weight (lbs) 3   Extension Strengthening;Right;Weights  3x10 kneeling   Extension Weight (lbs) 3   Shoulder Exercises: Sidelying   External Rotation Strengthening;Right;Weights  3x10   External Rotation Weight (lbs) 2   Shoulder Exercises: Standing   Protraction Strengthening;Right;Theraband  3x10   Theraband Level (Shoulder Protraction) Level 2 (Red)   External Rotation Strengthening;Right;Theraband  3x10   Theraband Level (Shoulder External  Rotation) Level 2 (Red)   Internal Rotation Strengthening;Right;Theraband  3x10   Theraband Level (Shoulder Internal Rotation) Level 2 (Red)   Flexion Strengthening;Both;Weights  3x10   Shoulder Flexion Weight (lbs) 2   Corporate treasurer;Both  3x10   Theraband Level (Shoulder Row) Level 2 (Red)   Other Standing Exercises B scaption 1# 3x10 reps,   Shoulder Exercises: Pulleys   Other Pulley Exercises RUE ranger flex/circles x30 reps   Shoulder Exercises: ROM/Strengthening   UBE (Upper Arm Bike) 60 RPM x8 min                  PT Short Term Goals - 03/08/16 1140    PT SHORT TERM GOAL #1   Title I with inital HEP   Time 2   Period Weeks   Status Achieved   PT SHORT TERM GOAL #2   Title decrease pain in R shoulder by 25% with ADLS.   Time 2   Period Weeks   Status Achieved  03/08/16            PT Long Term Goals - 05/24/16 0851    PT LONG TERM GOAL #1   Title Able to perform ADLs including work requirements with R shoulder pain 2/10 or less.   Time 4   Period Weeks   Status On-going   PT LONG TERM GOAL #2   Title Demo active R shoulder flex to 165 deg to improve function.   Time 4   Period Weeks   Status On-going  AROM 160 degrees 05/24/16   PT LONG TERM GOAL #3   Title Demo 5/5 R shoulder strength   Time 4   Period Weeks   Status On-going               Plan - 05/24/16 0855    Clinical Impression Statement Patient progressing with strengthening exercises with weights and reps and no increase of pain only slight fatigue. Patient has not improved with AROM in right shoulder flexion today. Patient reported going swimming over weekend yet was unable to continue due to pain in right shoulder and bicep area. Goals ongoing due to pain, strength and full Flexion ROM.   Rehab Potential Excellent   Clinical Impairments Affecting Rehab Potential 01/20/16 surgery current 18 weeks 05/25/16   PT Frequency 3x / week   PT Duration 4 weeks   PT  Treatment/Interventions ADLs/Self Care Home Management;Electrical Stimulation;Cryotherapy;Moist Heat;Therapeutic exercise;Therapeutic activities;Ultrasound;Neuromuscular re-education;Patient/family education;Manual techniques;Passive range of motion;Vasopneumatic Device   PT Next Visit Plan Awaiting MD signature   Consulted and Agree with Plan of Care Patient      Patient will benefit from skilled therapeutic intervention in order to improve the following deficits and impairments:  Pain, Decreased range of motion, Decreased activity tolerance, Decreased strength, Impaired UE functional use  Visit Diagnosis: Right shoulder pain  Stiffness  of right shoulder, not elsewhere classified  Decreased range of motion of right shoulder     Problem List Patient Active Problem List   Diagnosis Date Noted  . Diverticulitis large intestine 01/07/2015  . Diverticulitis 07/03/2014  . Hypokalemia 07/03/2014  . Tobacco abuse   . Obesity   . Upper airway cough syndrome 01/28/2014  . IBS (irritable bowel syndrome) 07/10/2013  . Acute bilateral lower abdominal pain 03/26/2013  . Tiredness 03/26/2013  . Sigmoid diverticulitis 09/12/2012  . Epigastric pain 05/22/2012  . Nausea and vomiting 01/28/2012  . Crohn's disease of ileum ([place]) 01/10/2012  . GERD (gastroesophageal reflux disease) 01/10/2012  . HTN (hypertension) 01/10/2012    Keir Foland P, PTA 05/24/2016, 9:01 AM  [facility] [place], [place], [address] Phone: [phone]   Fax:  [phone]  Name: JAMISE PENTLAND MRN: [id number] Date of Birth: 04-26-1961

## 2016-05-25 ENCOUNTER — Encounter (INDEPENDENT_AMBULATORY_CARE_PROVIDER_SITE_OTHER): Payer: Self-pay | Admitting: Internal Medicine

## 2016-05-25 ENCOUNTER — Ambulatory Visit (INDEPENDENT_AMBULATORY_CARE_PROVIDER_SITE_OTHER): Payer: BLUE CROSS/BLUE SHIELD | Admitting: Internal Medicine

## 2016-05-25 VITALS — BP 138/90 | HR 76 | Temp 98.1°F | Ht 67.0 in | Wt 215.3 lb

## 2016-05-25 DIAGNOSIS — R197 Diarrhea, unspecified: Secondary | ICD-10-CM

## 2016-05-25 DIAGNOSIS — K5000 Crohn's disease of small intestine without complications: Secondary | ICD-10-CM | POA: Diagnosis not present

## 2016-05-25 LAB — C-REACTIVE PROTEIN: CRP: <0.5 mg/dL (ref <0.60)

## 2016-05-25 MED ORDER — DICYCLOMINE HCL 20 MG PO TABS: 20.0000 mg | ORAL_TABLET | Freq: Three times a day (TID) | ORAL | Status: DC

## 2016-05-25 MED ORDER — DICYCLOMINE HCL 10 MG PO CAPS: 10.0000 mg | ORAL_CAPSULE | Freq: Three times a day (TID) | ORAL | Status: DC

## 2016-05-25 NOTE — Patient Instructions (Addendum)
Rx for Dicyclomine 53m QID. Imodium twice a day

## 2016-05-25 NOTE — Progress Notes (Signed)
Subjective:    Patient ID: Elizabeth [name], female    DOB: [birth year], 55 y.o.   MRN: [id number]  HPI Presents today with c/o diarrhea. She says she does not have abdominal pain. Worse at night. She is having on average 7-8 stools a day. Stools are loose. No recent antibiotics.  No foul odor to her stools. Her appetite is good. No weight loss.  There are really no new symptoms. Hx of small bowel Crohn's. Diagnosed in 2004.  Rt shoulder surgery in February rotator cuff for rotator cuff tear.    Last colonoscopy was in September 2015 revealing focal erythema at ileocolonic anastomosis.  01/07/2015:  sigmoid colon resection for recurrent sigmoid diverticulitis. She had ongoing diverticulitis on path and no changes of Crohn's disease and resected segment  Review of Systems Past Medical History  Diagnosis Date  . Crohn's disease ([place])   . Elevated transaminase level   . Hypertension   . Bronchitis   . Cough 01/28/2014    upper airway cough syndrome  . Diverticulitis   . Tobacco abuse   . Obesity   . GERD (gastroesophageal reflux disease)   . Pneumonia 1/15    Past Surgical History  Procedure Laterality Date  . Hemocolectomy  2005    right  . Abdominal hysterectomy  1980  . Appendectomy    . Colonoscopy    . Esophagogastroduodenoscopy  06/16/2012    Procedure: ESOPHAGOGASTRODUODENOSCOPY (EGD);  Surgeon: Rogene Houston, MD;  [facility];  Service: Endoscopy;  Laterality: N/A;  10:30 AM  . Colonoscopy N/A 08/16/2014    Procedure: COLONOSCOPY;  Surgeon: Rogene Houston, MD;  [facility];  Service: Endoscopy;  Laterality: N/A;  1200-rescheduled to 9/4 @ 10:45 Ann notified pt  . Laparoscopic sigmoid colectomy N/A 01/07/2015    Procedure: LOW ANTERIOR COLON RESECTION;  Surgeon: Fanny Skates, MD;  Location: [place];  Service: General;  Laterality: N/A;  . Shoulder arthroscopy with labral repair Right 01/20/2016    Procedure: SHOULDER ARTHROSCOPY WITH LABRAL REPAIR;   Surgeon: Meredith Pel, MD;  Location: [place];  Service: Orthopedics;  Laterality: Right;    Allergies  Allergen Reactions  . Contrast Media [Iodinated Diagnostic Agents] Hives and Rash  . Penicillins Hives and Rash    *patient stated that she can takes some medication in the cillin family* Has patient had a PCN reaction causing immediate rash, facial/tongue/throat swelling, SOB or lightheadedness with hypotension:  Has patient had a PCN reaction causing severe rash involving mucus membranes or skin necrosis: No Has patient had a PCN reaction that required hospitalization No Has patient had a PCN reaction occurring within the last 10 years: No If all of    Current Outpatient Prescriptions on File Prior to Visit  Medication Sig Dispense Refill  . mesalamine (PENTASA) 500 MG CR capsule Take 1,000 mg by mouth 4 (four) times daily.     Marland Kitchen omeprazole (PRILOSEC) 20 MG capsule Take 2 capsules (40 mg total) by mouth daily. 30 capsule 5  . potassium chloride (MICRO-K) 10 MEQ CR capsule Take 10 mEq by mouth daily.     . valsartan-hydrochlorothiazide (DIOVAN-HCT) 160-12.5 MG per tablet Take 0.5 tablets by mouth daily.      No current facility-administered medications on file prior to visit.        Objective:   Physical Exam Blood pressure 138/90, pulse 76, temperature 98.1 F (36.7 C), height 5' 7"  (1.702 m), weight 215 lb 4.8 oz (97.659 kg).  Alert and oriented. Skin warm and dry. Oral mucosa is moist.   . Sclera anicteric, conjunctivae is pink. Thyroid not enlarged. No cervical lymphadenopathy. Lungs clear. Heart regular rate and rhythm.  Abdomen is soft. Bowel sounds are positive. No hepatomegaly. No abdominal masses felt. No tenderness.  No edema to lower extremities.    Dr. Laural Golden in with patient.       Assessment & Plan:  Chronic diarrhea. Am going to restart her on Dicyclomine QID. OV in 6 months.

## 2016-05-26 ENCOUNTER — Encounter: Payer: Self-pay | Admitting: Physical Therapy

## 2016-05-26 ENCOUNTER — Ambulatory Visit: Payer: BLUE CROSS/BLUE SHIELD | Admitting: Physical Therapy

## 2016-05-26 DIAGNOSIS — M25611 Stiffness of right shoulder, not elsewhere classified: Secondary | ICD-10-CM

## 2016-05-26 DIAGNOSIS — M25511 Pain in right shoulder: Secondary | ICD-10-CM

## 2016-05-26 NOTE — Therapy (Signed)
[facility] [place], [place], [address] Phone: [phone]   Fax:  [phone]  Physical Therapy Treatment  Patient Details  Name: Elizabeth [name] MRN: [id number] Date of Birth: 08/09/1961 Referring Provider: Meredith Pel MD.  Encounter Date: 05/26/2016      PT End of Session - 05/26/16 0834    Visit Number 31   Number of Visits 39   Date for PT Re-Evaluation 06/09/16   PT Start Time 0814   PT Stop Time 0857   PT Time Calculation (min) 43 min   Activity Tolerance Patient tolerated treatment well   Behavior During Therapy [facility] for tasks assessed/performed      Past Medical History  Diagnosis Date  . Crohn's disease ([place])   . Elevated transaminase level   . Hypertension   . Bronchitis   . Cough 01/28/2014    upper airway cough syndrome  . Diverticulitis   . Tobacco abuse   . Obesity   . GERD (gastroesophageal reflux disease)   . Pneumonia 1/15    Past Surgical History  Procedure Laterality Date  . Hemocolectomy  2005    right  . Abdominal hysterectomy  1980  . Appendectomy    . Colonoscopy    . Esophagogastroduodenoscopy  06/16/2012    Procedure: ESOPHAGOGASTRODUODENOSCOPY (EGD);  Surgeon: Rogene Houston, MD;  [facility];  Service: Endoscopy;  Laterality: N/A;  10:30 AM  . Colonoscopy N/A 08/16/2014    Procedure: COLONOSCOPY;  Surgeon: Rogene Houston, MD;  [facility];  Service: Endoscopy;  Laterality: N/A;  1200-rescheduled to 9/4 @ 10:45 Ann notified pt  . Laparoscopic sigmoid colectomy N/A 01/07/2015    Procedure: LOW ANTERIOR COLON RESECTION;  Surgeon: Fanny Skates, MD;  Location: [place];  Service: General;  Laterality: N/A;  . Shoulder arthroscopy with labral repair Right 01/20/2016    Procedure: SHOULDER ARTHROSCOPY WITH LABRAL REPAIR;  Surgeon: Meredith Pel, MD;  Location: [place];  Service: Orthopedics;  Laterality: Right;    There were no vitals filed for this  visit.      Subjective Assessment - 05/26/16 0824    Subjective Patient reported continued pain and soreness in shoulder ant shoulder bicep area esp with sleeping   Pertinent History HTN, Crohns Disease   Diagnostic tests mri, xray; results: mod RC tendinopathy/tendinosis, shallow tears of supraspinatus (no full thickness tears); ant/inf labral tear; mod GH degenerative changes   Patient Stated Goals Get back to work.   Currently in Pain? Yes   Pain Score 3    Pain Location Shoulder   Pain Orientation Right   Pain Descriptors / Indicators Aching   Pain Type Surgical pain   Pain Onset More than a month ago   Pain Frequency Intermittent   Aggravating Factors  prolong use of shoulder   Pain Relieving Factors at rest                         [facility] Adult PT Treatment/Exercise - 05/26/16 0001    Elbow Exercises   Elbow Flexion Strengthening;Right   Bar Weights/Barbell (Elbow Flexion) 5 lbs  3x fatigue   Elbow Extension Strengthening;Standing  3x15   Theraband Level (Elbow Extension) --  pink xts    Shoulder Exercises: Supine   Other Supine Exercises serratus punches 3# 3x10   Shoulder Exercises: Prone   Retraction Strengthening;Right;Weights  3x10 kneeling   Retraction Weight (lbs) 3   Extension Strengthening;Right;Weights  3x10 kneeling   Extension Weight (lbs) 3   Shoulder Exercises: Sidelying   External Rotation --  3x10 kneeling   External Rotation Weight (lbs) 2   Shoulder Exercises: Standing   Protraction Strengthening;Right;Theraband  3x10   Theraband Level (Shoulder Protraction) Level 2 (Red)   External Rotation Strengthening;Right;Theraband  3x10   Theraband Level (Shoulder External Rotation) Level 2 (Red)   Internal Rotation Strengthening;Right;Theraband  3x10   Theraband Level (Shoulder Internal Rotation) Level 2 (Red)   Flexion Strengthening;Both;Weights  3x10   Shoulder Flexion Weight (lbs) 2   Corporate treasurer;Both  3x10   Theraband  Level (Shoulder Row) Level 2 (Red)   Other Standing Exercises B scaption 1# 3x10 reps,   Other Standing Exercises cone stacking on shelf above shoulder with 0 weight 2x fatigue   Shoulder Exercises: Pulleys   Other Pulley Exercises RUE ranger flex/circles x30 reps   Shoulder Exercises: ROM/Strengthening   UBE (Upper Arm Bike) 60 RPM x8 min                  PT Short Term Goals - 03/08/16 1140    PT SHORT TERM GOAL #1   Title I with inital HEP   Time 2   Period Weeks   Status Achieved   PT SHORT TERM GOAL #2   Title decrease pain in R shoulder by 25% with ADLS.   Time 2   Period Weeks   Status Achieved  03/08/16            PT Long Term Goals - 05/24/16 0851    PT LONG TERM GOAL #1   Title Able to perform ADLs including work requirements with R shoulder pain 2/10 or less.   Time 4   Period Weeks   Status On-going   PT LONG TERM GOAL #2   Title Demo active R shoulder flex to 165 deg to improve function.   Time 4   Period Weeks   Status On-going  AROM 160 degrees 05/24/16   PT LONG TERM GOAL #3   Title Demo 5/5 R shoulder strength   Time 4   Period Weeks   Status On-going               Plan - 05/26/16 0835    Clinical Impression Statement Patient progressing with some decreased pain the past week yet continues to reports soreness in ant shoulder referring down to bicept area. Patient able to tolerate all exercises today without complaint. No improvement with ROM today. Goals ongoing due to pain, strength and full ROM deficits.   Rehab Potential Excellent   Clinical Impairments Affecting Rehab Potential 01/20/16 surgery current 18 weeks 05/25/16   PT Frequency 3x / week   PT Duration 4 weeks   PT Treatment/Interventions ADLs/Self Care Home Management;Electrical Stimulation;Cryotherapy;Moist Heat;Therapeutic exercise;Therapeutic activities;Ultrasound;Neuromuscular re-education;Patient/family education;Manual techniques;Passive range of motion;Vasopneumatic  Device   PT Next Visit Plan Awaiting MD signature   Consulted and Agree with Plan of Care Patient      Patient will benefit from skilled therapeutic intervention in order to improve the following deficits and impairments:  Pain, Decreased range of motion, Decreased activity tolerance, Decreased strength, Impaired UE functional use  Visit Diagnosis: Right shoulder pain  Stiffness of right shoulder, not elsewhere classified  Decreased range of motion of right shoulder     Problem List Patient Active Problem List   Diagnosis Date Noted  . Diverticulitis large intestine 01/07/2015  . Diverticulitis 07/03/2014  . Hypokalemia 07/03/2014  .  Tobacco abuse   . Obesity   . Upper airway cough syndrome 01/28/2014  . IBS (irritable bowel syndrome) 07/10/2013  . Acute bilateral lower abdominal pain 03/26/2013  . Tiredness 03/26/2013  . Sigmoid diverticulitis 09/12/2012  . Epigastric pain 05/22/2012  . Nausea and vomiting 01/28/2012  . Crohn's disease of ileum ([place]) 01/10/2012  . GERD (gastroesophageal reflux disease) 01/10/2012  . HTN (hypertension) 01/10/2012    Elizabeth [name], PTA 05/26/2016, 8:58 AM  [facility] [place], [place], [address] Phone: [phone]   Fax:  [phone]  Name: Elizabeth [name] MRN: [id number] Date of Birth: Mar 31, 1961

## 2016-05-28 ENCOUNTER — Encounter: Payer: Self-pay | Admitting: *Deleted

## 2016-05-31 ENCOUNTER — Ambulatory Visit: Payer: BLUE CROSS/BLUE SHIELD | Admitting: Physical Therapy

## 2016-05-31 ENCOUNTER — Encounter: Payer: Self-pay | Admitting: Physical Therapy

## 2016-05-31 DIAGNOSIS — M25611 Stiffness of right shoulder, not elsewhere classified: Secondary | ICD-10-CM

## 2016-05-31 DIAGNOSIS — M25511 Pain in right shoulder: Secondary | ICD-10-CM

## 2016-05-31 NOTE — Therapy (Signed)
[facility] [place], [place], [address] Phone: [phone]   Fax:  [phone]  Physical Therapy Treatment  Patient Details  Name: Elizabeth [name] MRN: [id number] Date of Birth: 09/30/1961 Referring Provider: Meredith Pel MD.  Encounter Date: 05/31/2016      PT End of Session - 05/31/16 0846    Visit Number 32   Number of Visits 39   Date for PT Re-Evaluation 06/09/16   PT Start Time 0815   PT Stop Time 0814   PT Time Calculation (min) 42 min   Activity Tolerance Patient tolerated treatment well   Behavior During Therapy [facility] for tasks assessed/performed      Past Medical History  Diagnosis Date  . Crohn's disease ([place])   . Elevated transaminase level   . Hypertension   . Bronchitis   . Cough 01/28/2014    upper airway cough syndrome  . Diverticulitis   . Tobacco abuse   . Obesity   . GERD (gastroesophageal reflux disease)   . Pneumonia 1/15    Past Surgical History  Procedure Laterality Date  . Hemocolectomy  2005    right  . Abdominal hysterectomy  1980  . Appendectomy    . Colonoscopy    . Esophagogastroduodenoscopy  06/16/2012    Procedure: ESOPHAGOGASTRODUODENOSCOPY (EGD);  Surgeon: Rogene Houston, MD;  [facility];  Service: Endoscopy;  Laterality: N/A;  10:30 AM  . Colonoscopy N/A 08/16/2014    Procedure: COLONOSCOPY;  Surgeon: Rogene Houston, MD;  [facility];  Service: Endoscopy;  Laterality: N/A;  1200-rescheduled to 9/4 @ 10:45 Ann notified pt  . Laparoscopic sigmoid colectomy N/A 01/07/2015    Procedure: LOW ANTERIOR COLON RESECTION;  Surgeon: Fanny Skates, MD;  Location: [place];  Service: General;  Laterality: N/A;  . Shoulder arthroscopy with labral repair Right 01/20/2016    Procedure: SHOULDER ARTHROSCOPY WITH LABRAL REPAIR;  Surgeon: Meredith Pel, MD;  Location: [place];  Service: Orthopedics;  Laterality: Right;    There were no vitals filed for this  visit.      Subjective Assessment - 05/31/16 0818    Subjective Patient reported less soreness in shoulder and bicep area and mostly hurts at night while sleeping   Pertinent History HTN, Crohns Disease   Diagnostic tests mri, xray; results: mod RC tendinopathy/tendinosis, shallow tears of supraspinatus (no full thickness tears); ant/inf labral tear; mod GH degenerative changes   Patient Stated Goals Get back to work.   Currently in Pain? Yes   Pain Score 2    Pain Location Shoulder   Pain Orientation Right   Pain Descriptors / Indicators Aching   Pain Type Surgical pain   Pain Onset More than a month ago   Pain Frequency Intermittent   Aggravating Factors  prolong use and at night   Pain Relieving Factors at rest                         [facility] Adult PT Treatment/Exercise - 05/31/16 0001    Elbow Exercises   Elbow Flexion Strengthening;Right   Bar Weights/Barbell (Elbow Flexion) 5 lbs  3xfatigue   Elbow Extension Strengthening;Standing  3x15   Theraband Level (Elbow Extension) --  pink XTS   Shoulder Exercises: Supine   Other Supine Exercises serratus punches 3# 3x10   Shoulder Exercises: Prone   Retraction Strengthening;Right;Weights  3x10 kneeling   Retraction Weight (lbs) 3   Extension Strengthening;Right;Weights  3x10 kneeling   Extension Weight (lbs) 3   Shoulder Exercises: Sidelying   External Rotation Strengthening;Right;Weights  3x10    External Rotation Weight (lbs) 2   Shoulder Exercises: Standing   Protraction Strengthening;Right;Theraband  3x10   Theraband Level (Shoulder Protraction) Level 2 (Red)   External Rotation Strengthening;Right;Theraband  3x10   Theraband Level (Shoulder External Rotation) Level 2 (Red)   Internal Rotation Strengthening;Right;Theraband  3x10   Theraband Level (Shoulder Internal Rotation) Level 2 (Red)   Flexion Strengthening;Both;Weights  3x10   Shoulder Flexion Weight (lbs) 2   Corporate treasurer;Both   3x10   Theraband Level (Shoulder Row) Level 2 (Red)   Other Standing Exercises B scaption 1# 3x10 reps,   Other Standing Exercises --   Shoulder Exercises: Pulleys   Other Pulley Exercises RUE ranger flex/circles x30 reps   Shoulder Exercises: ROM/Strengthening   UBE (Upper Arm Bike) 60 RPM x8 min                  PT Short Term Goals - 03/08/16 1140    PT SHORT TERM GOAL #1   Title I with inital HEP   Time 2   Period Weeks   Status Achieved   PT SHORT TERM GOAL #2   Title decrease pain in R shoulder by 25% with ADLS.   Time 2   Period Weeks   Status Achieved  03/08/16            PT Long Term Goals - 05/31/16 0850    PT LONG TERM GOAL #1   Title Able to perform ADLs including work requirements with R shoulder pain 2/10 or less.   Time 4   Period Weeks   Status On-going  Pain up to 5-6/10 with ADL's and has not returned to work at this time 05/31/16   PT LONG TERM GOAL #2   Title Demo active R shoulder flex to 165 deg to improve function.   Time 4   Period Weeks   Status On-going  AROM 159 degrees 05/31/16   PT LONG TERM GOAL #3   Title Demo 5/5 R shoulder strength   Time 4   Period Weeks   Status On-going               Plan - 05/31/16 8676    Clinical Impression Statement Patient progressing slowly today due to muscle fatigue and pain with use. Patient reported not doing anything this weekend due to other medical issues. Patient ROM slight decreased and may be due to pain and fatigue. Patient unable to meet any further goals due to pain, strength and full ROM deficits.   Rehab Potential Excellent   Clinical Impairments Affecting Rehab Potential 01/20/16 surgery current 18 weeks 05/25/16   PT Frequency 3x / week   PT Duration 4 weeks   PT Treatment/Interventions ADLs/Self Care Home Management;Electrical Stimulation;Cryotherapy;Moist Heat;Therapeutic exercise;Therapeutic activities;Ultrasound;Neuromuscular re-education;Patient/family education;Manual  techniques;Passive range of motion;Vasopneumatic Device   PT Next Visit Plan Awaiting MD signature   Consulted and Agree with Plan of Care Patient      Patient will benefit from skilled therapeutic intervention in order to improve the following deficits and impairments:  Pain, Decreased range of motion, Decreased activity tolerance, Decreased strength, Impaired UE functional use  Visit Diagnosis: Right shoulder pain  Stiffness of right shoulder, not elsewhere classified     Problem List Patient Active Problem List   Diagnosis Date Noted  . Diverticulitis large intestine 01/07/2015  . Diverticulitis 07/03/2014  .  Hypokalemia 07/03/2014  . Tobacco abuse   . Obesity   . Upper airway cough syndrome 01/28/2014  . IBS (irritable bowel syndrome) 07/10/2013  . Acute bilateral lower abdominal pain 03/26/2013  . Tiredness 03/26/2013  . Sigmoid diverticulitis 09/12/2012  . Epigastric pain 05/22/2012  . Nausea and vomiting 01/28/2012  . Crohn's disease of ileum ([place]) 01/10/2012  . GERD (gastroesophageal reflux disease) 01/10/2012  . HTN (hypertension) 01/10/2012    Kelijah Towry, Venetia Maxon, PTA [facility] [address] [place], [place], [address] Phone: [phone]   Fax:  [phone]  Name: Elizabeth [name] MRN: [id number] Date of Birth: 30-Mar-1961

## 2016-05-31 NOTE — Therapy (Signed)
[facility] [place], [place], [address] Phone: [phone]   Fax:  [phone]  Physical Therapy Treatment  Patient Details  Name: Elizabeth [name] MRN: [id number] Date of Birth: 10/14/1961 Referring Provider: Meredith Pel MD.  Encounter Date: 05/31/2016      PT End of Session - 05/31/16 0846    Visit Number 32   Number of Visits 51   Date for PT Re-Evaluation 08/03/16  per signed order see orders   PT Start Time 0815   PT Stop Time 0857   PT Time Calculation (min) 42 min   Activity Tolerance Patient tolerated treatment well   Behavior During Therapy [facility] for tasks assessed/performed      Past Medical History  Diagnosis Date  . Crohn's disease ([place])   . Elevated transaminase level   . Hypertension   . Bronchitis   . Cough 01/28/2014    upper airway cough syndrome  . Diverticulitis   . Tobacco abuse   . Obesity   . GERD (gastroesophageal reflux disease)   . Pneumonia 1/15    Past Surgical History  Procedure Laterality Date  . Hemocolectomy  2005    right  . Abdominal hysterectomy  1980  . Appendectomy    . Colonoscopy    . Esophagogastroduodenoscopy  06/16/2012    Procedure: ESOPHAGOGASTRODUODENOSCOPY (EGD);  Surgeon: Rogene Houston, MD;  [facility];  Service: Endoscopy;  Laterality: N/A;  10:30 AM  . Colonoscopy N/A 08/16/2014    Procedure: COLONOSCOPY;  Surgeon: Rogene Houston, MD;  [facility];  Service: Endoscopy;  Laterality: N/A;  1200-rescheduled to 9/4 @ 10:45 Ann notified pt  . Laparoscopic sigmoid colectomy N/A 01/07/2015    Procedure: LOW ANTERIOR COLON RESECTION;  Surgeon: Fanny Skates, MD;  Location: [facility];  Service: General;  Laterality: N/A;  . Shoulder arthroscopy with labral repair Right 01/20/2016    Procedure: SHOULDER ARTHROSCOPY WITH LABRAL REPAIR;  Surgeon: Meredith Pel, MD;  Location: [facility];  Service: Orthopedics;  Laterality: Right;    There were no  vitals filed for this visit.      Subjective Assessment - 05/31/16 0818    Subjective Patient reported less soreness in shoulder and bicep area and mostly hurts at night while sleeping   Pertinent History HTN, Crohns Disease   Diagnostic tests mri, xray; results: mod RC tendinopathy/tendinosis, shallow tears of supraspinatus (no full thickness tears); ant/inf labral tear; mod GH degenerative changes   Patient Stated Goals Get back to work.   Currently in Pain? Yes   Pain Score 2    Pain Location Shoulder   Pain Orientation Right   Pain Descriptors / Indicators Aching   Pain Type Surgical pain   Pain Onset More than a month ago   Pain Frequency Intermittent   Aggravating Factors  prolong use and at night   Pain Relieving Factors at rest                         [facility] Adult PT Treatment/Exercise - 05/31/16 0001    Elbow Exercises   Elbow Flexion Strengthening;Right   Bar Weights/Barbell (Elbow Flexion) 5 lbs  3xfatigue   Elbow Extension Strengthening;Standing  3x15   Theraband Level (Elbow Extension) --  pink XTS   Shoulder Exercises: Supine   Other Supine Exercises serratus punches 3# 3x10   Shoulder Exercises: Prone   Retraction Strengthening;Right;Weights  3x10 kneeling   Retraction Weight (  lbs) 3   Extension Strengthening;Right;Weights  3x10 kneeling   Extension Weight (lbs) 3   Shoulder Exercises: Sidelying   External Rotation Strengthening;Right;Weights  3x10    External Rotation Weight (lbs) 2   Shoulder Exercises: Standing   Protraction Strengthening;Right;Theraband  3x10   Theraband Level (Shoulder Protraction) Level 2 (Red)   External Rotation Strengthening;Right;Theraband  3x10   Theraband Level (Shoulder External Rotation) Level 2 (Red)   Internal Rotation Strengthening;Right;Theraband  3x10   Theraband Level (Shoulder Internal Rotation) Level 2 (Red)   Flexion Strengthening;Both;Weights  3x10   Shoulder Flexion Weight (lbs) 2   Medical sales representative;Both  3x10   Theraband Level (Shoulder Row) Level 2 (Red)   Other Standing Exercises B scaption 1# 3x10 reps,   Other Standing Exercises --   Shoulder Exercises: Pulleys   Other Pulley Exercises RUE ranger flex/circles x30 reps   Shoulder Exercises: ROM/Strengthening   UBE (Upper Arm Bike) 60 RPM x8 min                  PT Short Term Goals - 03/08/16 1140    PT SHORT TERM GOAL #1   Title I with inital HEP   Time 2   Period Weeks   Status Achieved   PT SHORT TERM GOAL #2   Title decrease pain in R shoulder by 25% with ADLS.   Time 2   Period Weeks   Status Achieved  03/08/16            PT Long Term Goals - 05/31/16 0850    PT LONG TERM GOAL #1   Title Able to perform ADLs including work requirements with R shoulder pain 2/10 or less.   Time 4   Period Weeks   Status On-going  Pain up to 5-6/10 with ADL's and has not returned to work at this time 05/31/16   PT LONG TERM GOAL #2   Title Demo active R shoulder flex to 165 deg to improve function.   Time 4   Period Weeks   Status On-going  AROM 159 degrees 05/31/16   PT LONG TERM GOAL #3   Title Demo 5/5 R shoulder strength   Time 4   Period Weeks   Status On-going               Plan - 05/31/16 7106    Clinical Impression Statement Patient progressing slowly today due to muscle fatigue and pain with use. Patient reported not doing anything this weekend due to other medical issues. Patient ROM slight decreased and may be due to pain and fatigue. Patient unable to meet any further goals due to pain, strength and full ROM deficits.   Rehab Potential Excellent   Clinical Impairments Affecting Rehab Potential 01/20/16 surgery current 18 weeks 05/25/16   PT Frequency 3x / week   PT Duration 4 weeks   PT Treatment/Interventions ADLs/Self Care Home Management;Electrical Stimulation;Cryotherapy;Moist Heat;Therapeutic exercise;Therapeutic activities;Ultrasound;Neuromuscular  re-education;Patient/family education;Manual techniques;Passive range of motion;Vasopneumatic Device   PT Next Visit Plan cont with POC per MD/MPT   Consulted and Agree with Plan of Care Patient      Patient will benefit from skilled therapeutic intervention in order to improve the following deficits and impairments:  Pain, Decreased range of motion, Decreased activity tolerance, Decreased strength, Impaired UE functional use  Visit Diagnosis: Right shoulder pain  Stiffness of right shoulder, not elsewhere classified     Problem List Patient Active Problem List   Diagnosis Date Noted  . Diverticulitis  large intestine 01/07/2015  . Diverticulitis 07/03/2014  . Hypokalemia 07/03/2014  . Tobacco abuse   . Obesity   . Upper airway cough syndrome 01/28/2014  . IBS (irritable bowel syndrome) 07/10/2013  . Acute bilateral lower abdominal pain 03/26/2013  . Tiredness 03/26/2013  . Sigmoid diverticulitis 09/12/2012  . Epigastric pain 05/22/2012  . Nausea and vomiting 01/28/2012  . Crohn's disease of ileum ([place]) 01/10/2012  . GERD (gastroesophageal reflux disease) 01/10/2012  . HTN (hypertension) 01/10/2012    Eaton Folmar P, PTA 05/31/2016, 11:48 AM  [facility] [place], [place], [address] Phone: [phone]   Fax:  [phone]  Name: Elizabeth [name] MRN: [id number] Date of Birth: 06/11/1961

## 2016-06-02 ENCOUNTER — Ambulatory Visit: Payer: BLUE CROSS/BLUE SHIELD | Admitting: Physical Therapy

## 2016-06-02 ENCOUNTER — Encounter: Payer: Self-pay | Admitting: Physical Therapy

## 2016-06-02 DIAGNOSIS — M25511 Pain in right shoulder: Secondary | ICD-10-CM

## 2016-06-02 DIAGNOSIS — M25611 Stiffness of right shoulder, not elsewhere classified: Secondary | ICD-10-CM

## 2016-06-02 NOTE — Therapy (Signed)
[facility] [place], [place], [address] Phone: [phone]   Fax:  [phone]  Physical Therapy Treatment  Patient Details  Name: Elizabeth [name] MRN: [id number] Date of Birth: 12-20-1960 Referring Provider: Meredith Pel MD.  Encounter Date: 06/02/2016      PT End of Session - 06/02/16 0849    Visit Number 33   Number of Visits 51   Date for PT Re-Evaluation 08/03/16   PT Start Time 0814   PT Stop Time 0857   PT Time Calculation (min) 43 min   Activity Tolerance Patient tolerated treatment well   Behavior During Therapy [facility] for tasks assessed/performed      Past Medical History  Diagnosis Date  . Crohn's disease ([place])   . Elevated transaminase level   . Hypertension   . Bronchitis   . Cough 01/28/2014    upper airway cough syndrome  . Diverticulitis   . Tobacco abuse   . Obesity   . GERD (gastroesophageal reflux disease)   . Pneumonia 1/15    Past Surgical History  Procedure Laterality Date  . Hemocolectomy  2005    right  . Abdominal hysterectomy  1980  . Appendectomy    . Colonoscopy    . Esophagogastroduodenoscopy  06/16/2012    Procedure: ESOPHAGOGASTRODUODENOSCOPY (EGD);  Surgeon: Rogene Houston, MD;  [facility];  Service: Endoscopy;  Laterality: N/A;  10:30 AM  . Colonoscopy N/A 08/16/2014    Procedure: COLONOSCOPY;  Surgeon: Rogene Houston, MD;  [facility];  Service: Endoscopy;  Laterality: N/A;  1200-rescheduled to 9/4 @ 10:45 Ann notified pt  . Laparoscopic sigmoid colectomy N/A 01/07/2015    Procedure: LOW ANTERIOR COLON RESECTION;  Surgeon: Fanny Skates, MD;  Location: [facility];  Service: General;  Laterality: N/A;  . Shoulder arthroscopy with labral repair Right 01/20/2016    Procedure: SHOULDER ARTHROSCOPY WITH LABRAL REPAIR;  Surgeon: Meredith Pel, MD;  Location: [facility];  Service: Orthopedics;  Laterality: Right;    There were no vitals filed for this  visit.      Subjective Assessment - 06/02/16 0816    Subjective Patient feels like she will "never get better" patient has c/o of numbness in right UE down to thumb   Pertinent History HTN, Crohns Disease   Diagnostic tests mri, xray; results: mod RC tendinopathy/tendinosis, shallow tears of supraspinatus (no full thickness tears); ant/inf labral tear; mod GH degenerative changes   Patient Stated Goals Get back to work.   Currently in Pain? Yes   Pain Score 3    Pain Location Shoulder   Pain Orientation Right   Pain Descriptors / Indicators Aching   Pain Type Surgical pain   Pain Onset More than a month ago   Pain Frequency Intermittent   Aggravating Factors  prolong use and at night   Pain Relieving Factors at rest                         [facility] Adult PT Treatment/Exercise - 06/02/16 0001    Elbow Exercises   Elbow Flexion Strengthening;Right   Bar Weights/Barbell (Elbow Flexion) 5 lbs  3x fatigue   Elbow Extension Strengthening;Standing  3x15   Theraband Level (Elbow Extension) --  pink XTS   Shoulder Exercises: Supine   Other Supine Exercises serratus punches 3# 3x10   Shoulder Exercises: Prone   Retraction Strengthening;Right;Weights  3x10   Retraction Weight (lbs) 4  Extension Strengthening;Right;Weights  3x10   Extension Weight (lbs) 4   Shoulder Exercises: Sidelying   External Rotation Strengthening;Right;Weights  3x10   External Rotation Weight (lbs) 3   Shoulder Exercises: Standing   Protraction Strengthening;Right;Theraband  3x10   Theraband Level (Shoulder Protraction) Level 2 (Red)   External Rotation Strengthening;Right;Theraband  3x10   Theraband Level (Shoulder External Rotation) Level 2 (Red)   Internal Rotation Strengthening;Right;Theraband  3x10   Theraband Level (Shoulder Internal Rotation) Level 2 (Red)   Flexion Strengthening;Both;Weights  3x10   Shoulder Flexion Weight (lbs) 3   Row Strengthening;Both  3x10   Theraband  Level (Shoulder Row) Level 2 (Red)   Other Standing Exercises B scaption 2# 3x10 reps,   Shoulder Exercises: Pulleys   Other Pulley Exercises RUE ranger flex/circles x30 reps   Shoulder Exercises: ROM/Strengthening   UBE (Upper Arm Bike) 60 RPM x8 min                  PT Short Term Goals - 03/08/16 1140    PT SHORT TERM GOAL #1   Title I with inital HEP   Time 2   Period Weeks   Status Achieved   PT SHORT TERM GOAL #2   Title decrease pain in R shoulder by 25% with ADLS.   Time 2   Period Weeks   Status Achieved  03/08/16            PT Long Term Goals - 05/31/16 0850    PT LONG TERM GOAL #1   Title Able to perform ADLs including work requirements with R shoulder pain 2/10 or less.   Time 4   Period Weeks   Status On-going  Pain up to 5-6/10 with ADL's and has not returned to work at this time 05/31/16   PT LONG TERM GOAL #2   Title Demo active R shoulder flex to 165 deg to improve function.   Time 4   Period Weeks   Status On-going  AROM 159 degrees 05/31/16   PT LONG TERM GOAL #3   Title Demo 5/5 R shoulder strength   Time 4   Period Weeks   Status On-going               Plan - 06/02/16 2706    Clinical Impression Statement Patient progressing with c/o numbness down arm into thumb esp at night. Patient has reported doing normal ADL's. Patient able to progress with weight progression and no pain complaints, only some fatigue. Patient goals ongoing due to strength, ROM and pain deficits.   Rehab Potential Excellent   Clinical Impairments Affecting Rehab Potential 01/20/16 surgery current 18 weeks 05/25/16   PT Frequency 3x / week   PT Duration 4 weeks   PT Treatment/Interventions ADLs/Self Care Home Management;Electrical Stimulation;Cryotherapy;Moist Heat;Therapeutic exercise;Therapeutic activities;Ultrasound;Neuromuscular re-education;Patient/family education;Manual techniques;Passive range of motion;Vasopneumatic Device   PT Next Visit Plan cont with  POC per MD/MPT      Patient will benefit from skilled therapeutic intervention in order to improve the following deficits and impairments:  Pain, Decreased range of motion, Decreased activity tolerance, Decreased strength, Impaired UE functional use  Visit Diagnosis: Right shoulder pain  Stiffness of right shoulder, not elsewhere classified     Problem List Patient Active Problem List   Diagnosis Date Noted  . Diverticulitis large intestine 01/07/2015  . Diverticulitis 07/03/2014  . Hypokalemia 07/03/2014  . Tobacco abuse   . Obesity   . Upper airway cough syndrome 01/28/2014  . IBS (irritable  bowel syndrome) 07/10/2013  . Acute bilateral lower abdominal pain 03/26/2013  . Tiredness 03/26/2013  . Sigmoid diverticulitis 09/12/2012  . Epigastric pain 05/22/2012  . Nausea and vomiting 01/28/2012  . Crohn's disease of ileum ([place]) 01/10/2012  . GERD (gastroesophageal reflux disease) 01/10/2012  . HTN (hypertension) 01/10/2012    Alohilani Levenhagen P, PTA 06/02/2016, 8:59 AM  [facility] [place], [place], [address] Phone: [phone]   Fax:  [phone]  Name: LIS SAVITT MRN: [id number] Date of Birth: June 01, 1961

## 2016-06-04 ENCOUNTER — Encounter: Payer: Self-pay | Admitting: Physical Therapy

## 2016-06-04 ENCOUNTER — Ambulatory Visit: Payer: BLUE CROSS/BLUE SHIELD | Admitting: Physical Therapy

## 2016-06-04 DIAGNOSIS — M25511 Pain in right shoulder: Secondary | ICD-10-CM

## 2016-06-04 DIAGNOSIS — M25611 Stiffness of right shoulder, not elsewhere classified: Secondary | ICD-10-CM

## 2016-06-04 NOTE — Therapy (Signed)
[facility] [place], [place], [address] Phone: [phone]   Fax:  [phone]  Physical Therapy Treatment  Patient Details  Name: Elizabeth [name] MRN: [id number] Date of Birth: [birth year] Referring Provider: Meredith Pel MD.  Encounter Date: 06/04/2016      PT End of Session - 06/04/16 0812    Visit Number 34   Number of Visits 51   Date for PT Re-Evaluation 08/03/16   PT Start Time 0815   PT Stop Time 5329   PT Time Calculation (min) 39 min   Activity Tolerance Patient tolerated treatment well   Behavior During Therapy [facility] for tasks assessed/performed      Past Medical History  Diagnosis Date  . Crohn's disease ([place])   . Elevated transaminase level   . Hypertension   . Bronchitis   . Cough 01/28/2014    upper airway cough syndrome  . Diverticulitis   . Tobacco abuse   . Obesity   . GERD (gastroesophageal reflux disease)   . Pneumonia 1/15    Past Surgical History  Procedure Laterality Date  . Hemocolectomy  2005    right  . Abdominal hysterectomy  1980  . Appendectomy    . Colonoscopy    . Esophagogastroduodenoscopy  06/16/2012    Procedure: ESOPHAGOGASTRODUODENOSCOPY (EGD);  Surgeon: Rogene Houston, MD;  [facility];  Service: Endoscopy;  Laterality: N/A;  10:30 AM  . Colonoscopy N/A 08/16/2014    Procedure: COLONOSCOPY;  Surgeon: Rogene Houston, MD;  [facility];  Service: Endoscopy;  Laterality: N/A;  1200-rescheduled to 9/4 @ 10:45 Ann notified pt  . Laparoscopic sigmoid colectomy N/A 01/07/2015    Procedure: LOW ANTERIOR COLON RESECTION;  Surgeon: Fanny Skates, MD;  Location: [facility];  Service: General;  Laterality: N/A;  . Shoulder arthroscopy with labral repair Right 01/20/2016    Procedure: SHOULDER ARTHROSCOPY WITH LABRAL REPAIR;  Surgeon: Meredith Pel, MD;  Location: [facility];  Service: Orthopedics;  Laterality: Right;    There were no vitals filed for this  visit.      Subjective Assessment - 06/04/16 0812    Subjective Reports that her shoulder continues to "click." Reports that pain is most uncomfortable at night. Patient continues to report numbness along radial side of forearm into R thumb but reports that that does not occur at night.   Pertinent History HTN, Crohns Disease   Diagnostic tests mri, xray; results: mod RC tendinopathy/tendinosis, shallow tears of supraspinatus (no full thickness tears); ant/inf labral tear; mod GH degenerative changes   Patient Stated Goals Get back to work.   Currently in Pain? Yes   Pain Score 4    Pain Location Shoulder   Pain Orientation Right   Pain Type Surgical pain   Pain Onset More than a month ago            [facility] PT Assessment - 06/04/16 0001    Assessment   Medical Diagnosis Right shoulder pain.   Onset Date/Surgical Date 01/20/16   Hand Dominance Right   Next MD Visit 06/09/2016                     [facility] Adult PT Treatment/Exercise - 06/04/16 0001    Elbow Exercises   Elbow Flexion Strengthening;Right  3x10 reps   Bar Weights/Barbell (Elbow Flexion) 5 lbs   Elbow Extension Strengthening;Standing  Pink XTS 3x15 rep   Shoulder Exercises: Supine   Protraction Strengthening;Right;Weights  3x10 reps   Protraction Weight (lbs) 3   Shoulder Exercises: Prone   Retraction Strengthening;Right;Weights  3x10 reps   Retraction Weight (lbs) 4   Extension Strengthening;Right;Weights  3x10 reps   Extension Weight (lbs) 4   Shoulder Exercises: Sidelying   External Rotation Strengthening;Right;Weights  3x10 reps   External Rotation Weight (lbs) 3   Shoulder Exercises: Standing   Protraction Strengthening;Right;Theraband  3x10 reps   Theraband Level (Shoulder Protraction) Level 2 (Red)   External Rotation Strengthening;Right;Theraband  3x10 reps   Theraband Level (Shoulder External Rotation) Level 2 (Red)   Internal Rotation Strengthening;Right;Theraband  3x10 reps    Theraband Level (Shoulder Internal Rotation) Level 2 (Red)   Flexion Strengthening;Both;Weights  3x10 reps   Shoulder Flexion Weight (lbs) 3   Extension Strengthening;Right;Theraband  3x10 reps   Theraband Level (Shoulder Extension) Level 2 (Red)   Row Strengthening;Both  3x10 reps   Theraband Level (Shoulder Row) Level 2 (Red)   Other Standing Exercises B scaption 2# 3x10 reps,   Shoulder Exercises: Pulleys   Other Pulley Exercises RUE ranger flex/circles x30 reps   Shoulder Exercises: ROM/Strengthening   UBE (Upper Arm Bike) 90 RPM x8 min                  PT Short Term Goals - 03/08/16 1140    PT SHORT TERM GOAL #1   Title I with inital HEP   Time 2   Period Weeks   Status Achieved   PT SHORT TERM GOAL #2   Title decrease pain in R shoulder by 25% with ADLS.   Time 2   Period Weeks   Status Achieved  03/08/16            PT Long Term Goals - 05/31/16 0850    PT LONG TERM GOAL #1   Title Able to perform ADLs including work requirements with R shoulder pain 2/10 or less.   Time 4   Period Weeks   Status On-going  Pain up to 5-6/10 with ADL's and has not returned to work at this time 05/31/16   PT LONG TERM GOAL #2   Title Demo active R shoulder flex to 165 deg to improve function.   Time 4   Period Weeks   Status On-going  AROM 159 degrees 05/31/16   PT LONG TERM GOAL #3   Title Demo 5/5 R shoulder strength   Time 4   Period Weeks   Status On-going               Plan - 06/04/16 1478    Clinical Impression Statement Patient continues to complete R shoulder strengthening exercises well with only reports of fatigue with exercises. Patient notes that she feels stronger but she tires quickly. Patient continues to utilize increased resistance with exercises. Patient also continues to report R thumb and lateral forearm numbness and also continues to have anterior humerus and incision redness at nighttime. Patient had reported clicking in R shoulder  throughout the treatment but audible clicks were noted with R elbow flexion with 5#. No goals were achieved during this session of PT due to continued deficits with ROM and strength as well as pain with activity.    Rehab Potential Excellent   Clinical Impairments Affecting Rehab Potential 01/20/16 surgery current 18 weeks 05/25/16   PT Frequency 3x / week   PT Duration 4 weeks   PT Treatment/Interventions ADLs/Self Care Home Management;Electrical Stimulation;Cryotherapy;Moist Heat;Therapeutic exercise;Therapeutic activities;Ultrasound;Neuromuscular re-education;Patient/family education;Manual techniques;Passive range of motion;Vasopneumatic  Device   PT Next Visit Plan cont with POC per MD/MPT   Consulted and Agree with Plan of Care Patient      Patient will benefit from skilled therapeutic intervention in order to improve the following deficits and impairments:  Pain, Decreased range of motion, Decreased activity tolerance, Decreased strength, Impaired UE functional use  Visit Diagnosis: Right shoulder pain  Stiffness of right shoulder, not elsewhere classified  Decreased range of motion of right shoulder     Problem List Patient Active Problem List   Diagnosis Date Noted  . Diverticulitis large intestine 01/07/2015  . Diverticulitis 07/03/2014  . Hypokalemia 07/03/2014  . Tobacco abuse   . Obesity   . Upper airway cough syndrome 01/28/2014  . IBS (irritable bowel syndrome) 07/10/2013  . Acute bilateral lower abdominal pain 03/26/2013  . Tiredness 03/26/2013  . Sigmoid diverticulitis 09/12/2012  . Epigastric pain 05/22/2012  . Nausea and vomiting 01/28/2012  . Crohn's disease of ileum ([place]) 01/10/2012  . GERD (gastroesophageal reflux disease) 01/10/2012  . HTN (hypertension) 01/10/2012    Wynelle Fanny, PTA 06/04/2016, 9:04 AM  [facility] [address] [place], [place], [address] Phone: [phone]   Fax:   [phone]  Name: Elizabeth [name] MRN: [id number] Date of Birth: 1961/10/16

## 2016-06-07 ENCOUNTER — Ambulatory Visit: Payer: BLUE CROSS/BLUE SHIELD | Admitting: *Deleted

## 2016-06-07 DIAGNOSIS — M25611 Stiffness of right shoulder, not elsewhere classified: Secondary | ICD-10-CM

## 2016-06-07 DIAGNOSIS — M25511 Pain in right shoulder: Secondary | ICD-10-CM | POA: Diagnosis not present

## 2016-06-07 NOTE — Therapy (Signed)
[facility] [place], [place], [address] Phone: [phone]   Fax:  [phone]  Physical Therapy Treatment  Patient Details  Name: Elizabeth [name] MRN: [id number] Date of Birth: August 04, 1961 Referring Provider: Meredith Pel MD.  Encounter Date: 06/07/2016      PT End of Session - 06/07/16 0819    Visit Number 35   Number of Visits 51   Date for PT Re-Evaluation 08/03/16   PT Start Time 0815   PT Stop Time 0905   PT Time Calculation (min) 50 min      Past Medical History  Diagnosis Date  . Crohn's disease ([place])   . Elevated transaminase level   . Hypertension   . Bronchitis   . Cough 01/28/2014    upper airway cough syndrome  . Diverticulitis   . Tobacco abuse   . Obesity   . GERD (gastroesophageal reflux disease)   . Pneumonia 1/15    Past Surgical History  Procedure Laterality Date  . Hemocolectomy  2005    right  . Abdominal hysterectomy  1980  . Appendectomy    . Colonoscopy    . Esophagogastroduodenoscopy  06/16/2012    Procedure: ESOPHAGOGASTRODUODENOSCOPY (EGD);  Surgeon: Rogene Houston, MD;  [facility];  Service: Endoscopy;  Laterality: N/A;  10:30 AM  . Colonoscopy N/A 08/16/2014    Procedure: COLONOSCOPY;  Surgeon: Rogene Houston, MD;  [facility];  Service: Endoscopy;  Laterality: N/A;  1200-rescheduled to 9/4 @ 10:45 Ann notified pt  . Laparoscopic sigmoid colectomy N/A 01/07/2015    Procedure: LOW ANTERIOR COLON RESECTION;  Surgeon: Fanny Skates, MD;  Location: [place];  Service: General;  Laterality: N/A;  . Shoulder arthroscopy with labral repair Right 01/20/2016    Procedure: SHOULDER ARTHROSCOPY WITH LABRAL REPAIR;  Surgeon: Meredith Pel, MD;  Location: [place];  Service: Orthopedics;  Laterality: Right;    There were no vitals filed for this visit.      Subjective Assessment - 06/07/16 0812    Subjective Reports that her shoulder continues to "click." Reports  that pain is most uncomfortable at night. Patient continues to report numbness along radial side of forearm into R thumb but reports that that does not occur at night. To MD on WEd.  Feels like it is getting worse   Pertinent History HTN, Crohns Disease   Diagnostic tests mri, xray; results: mod RC tendinopathy/tendinosis, shallow tears of supraspinatus (no full thickness tears); ant/inf labral tear; mod GH degenerative changes   Patient Stated Goals Get back to work.   Currently in Pain? Yes   Pain Score 6    Pain Location Shoulder   Pain Orientation Right   Pain Descriptors / Indicators Aching   Pain Type Surgical pain   Pain Onset More than a month ago   Pain Frequency Intermittent   Aggravating Factors  Prolong use and at night            [facility] PT Assessment - 06/07/16 0001    AROM   AROM Assessment Site Shoulder   Right/Left Shoulder Right   Right Shoulder Flexion 150 Degrees  standing   Right Shoulder Internal Rotation 70 Degrees  HBB to L2 standing   Right Shoulder External Rotation 60 Degrees  standing   PROM   Right/Left Shoulder Right   Right Shoulder Flexion 165 Degrees   Right Shoulder Internal Rotation 90 Degrees   Right Shoulder External Rotation 70 Degrees  [facility] Adult PT Treatment/Exercise - 06/07/16 0001    Shoulder Exercises: Standing   Protraction --  3x10 reps   Shoulder Exercises: Pulleys   Other Pulley Exercises RUE ranger flex/circles x30 reps   Shoulder Exercises: ROM/Strengthening   UBE (Upper Arm Bike) 90 RPM x6 min                  PT Short Term Goals - 03/08/16 1140    PT SHORT TERM GOAL #1   Title I with inital HEP   Time 2   Period Weeks   Status Achieved   PT SHORT TERM GOAL #2   Title decrease pain in R shoulder by 25% with ADLS.   Time 2   Period Weeks   Status Achieved  03/08/16            PT Long Term Goals - 05/31/16 0850    PT LONG TERM GOAL #1   Title Able to perform ADLs  including work requirements with R shoulder pain 2/10 or less.   Time 4   Period Weeks   Status On-going  Pain up to 5-6/10 with ADL's and has not returned to work at this time 05/31/16   PT LONG TERM GOAL #2   Title Demo active R shoulder flex to 165 deg to improve function.   Time 4   Period Weeks   Status On-going  AROM 159 degrees 05/31/16   PT LONG TERM GOAL #3   Title Demo 5/5 R shoulder strength   Time 4   Period Weeks   Status On-going               Plan - 06/07/16 0819    Clinical Impression Statement Pt feels that RT shldr is getting worse. She reports RT thumb and lateral forearm numbness. Her AROM in standing today was Flexion to 150 ,ER to 60 , and Hand behind back was to L2-3. Her PROM for flexion was 160 degrees and er to 70 degrees. and had a soft end-feel. Her chief complaint was with active elevation. Pt has had increased pain in the last week or so. Goals are ongoing.   Rehab Potential Excellent   Clinical Impairments Affecting Rehab Potential 01/20/16 surgery current 18 weeks 05/25/16   PT Frequency 3x / week   PT Duration 4 weeks   PT Treatment/Interventions ADLs/Self Care Home Management;Electrical Stimulation;Cryotherapy;Moist Heat;Therapeutic exercise;Therapeutic activities;Ultrasound;Neuromuscular re-education;Patient/family education;Manual techniques;Passive range of motion;Vasopneumatic Device   PT Next Visit Plan cont with POC per MD/MPT    Route MD note today   Consulted and Agree with Plan of Care Patient      Patient will benefit from skilled therapeutic intervention in order to improve the following deficits and impairments:  Pain, Decreased range of motion, Decreased activity tolerance, Decreased strength, Impaired UE functional use  Visit Diagnosis: Right shoulder pain  Stiffness of right shoulder, not elsewhere classified     Problem List Patient Active Problem List   Diagnosis Date Noted  . Diverticulitis large intestine 01/07/2015   . Diverticulitis 07/03/2014  . Hypokalemia 07/03/2014  . Tobacco abuse   . Obesity   . Upper airway cough syndrome 01/28/2014  . IBS (irritable bowel syndrome) 07/10/2013  . Acute bilateral lower abdominal pain 03/26/2013  . Tiredness 03/26/2013  . Sigmoid diverticulitis 09/12/2012  . Epigastric pain 05/22/2012  . Nausea and vomiting 01/28/2012  . Crohn's disease of ileum ([place]) 01/10/2012  . GERD (gastroesophageal reflux disease) 01/10/2012  . HTN (hypertension) 01/10/2012  APPLEGATE, [place], PTA 06/07/2016, 3:56 PM Mali Applegate MPT [facility] [address] [place], [place], [address] Phone: [phone]   Fax:  [phone]  Name: Elizabeth [name] MRN: [id number] Date of Birth: 1961/09/08

## 2016-06-14 ENCOUNTER — Ambulatory Visit: Payer: BLUE CROSS/BLUE SHIELD | Attending: Orthopedic Surgery | Admitting: *Deleted

## 2016-06-14 DIAGNOSIS — M25511 Pain in right shoulder: Secondary | ICD-10-CM | POA: Diagnosis not present

## 2016-06-14 DIAGNOSIS — M25611 Stiffness of right shoulder, not elsewhere classified: Secondary | ICD-10-CM

## 2016-06-14 NOTE — Therapy (Signed)
[facility] [place], [place], [address] Phone: [phone]   Fax:  [phone]  Physical Therapy Treatment  Patient Details  Name: Elizabeth [name] MRN: [id number] Date of Birth: 12/12/1961 Referring Provider: Meredith Pel MD.  Encounter Date: 06/14/2016      PT End of Session - 06/14/16 0827    Visit Number 36   Number of Visits 51   Date for PT Re-Evaluation 07/21/16  NO 2xwk/ 6 wks 06-09-16   PT Start Time 0815   PT Stop Time 0901   PT Time Calculation (min) 46 min      Past Medical History  Diagnosis Date  . Crohn's disease ([place])   . Elevated transaminase level   . Hypertension   . Bronchitis   . Cough 01/28/2014    upper airway cough syndrome  . Diverticulitis   . Tobacco abuse   . Obesity   . GERD (gastroesophageal reflux disease)   . Pneumonia 1/15    Past Surgical History  Procedure Laterality Date  . Hemocolectomy  2005    right  . Abdominal hysterectomy  1980  . Appendectomy    . Colonoscopy    . Esophagogastroduodenoscopy  06/16/2012    Procedure: ESOPHAGOGASTRODUODENOSCOPY (EGD);  Surgeon: Rogene Houston, MD;  [facility];  Service: Endoscopy;  Laterality: N/A;  10:30 AM  . Colonoscopy N/A 08/16/2014    Procedure: COLONOSCOPY;  Surgeon: Rogene Houston, MD;  [facility];  Service: Endoscopy;  Laterality: N/A;  1200-rescheduled to 9/4 @ 10:45 Ann notified pt  . Laparoscopic sigmoid colectomy N/A 01/07/2015    Procedure: LOW ANTERIOR COLON RESECTION;  Surgeon: Fanny Skates, MD;  Location: [place];  Service: General;  Laterality: N/A;  . Shoulder arthroscopy with labral repair Right 01/20/2016    Procedure: SHOULDER ARTHROSCOPY WITH LABRAL REPAIR;  Surgeon: Meredith Pel, MD;  Location: [place];  Service: Orthopedics;  Laterality: Right;    There were no vitals filed for this visit.      Subjective Assessment - 06/14/16 1696    Subjective MD note to continue PT 2x wk  for 6 weeks and simulate work activities.   Pertinent History HTN, Crohns Disease   Diagnostic tests mri, xray; results: mod RC tendinopathy/tendinosis, shallow tears of supraspinatus (no full thickness tears); ant/inf labral tear; mod GH degenerative changes   Patient Stated Goals Get back to work.   Currently in Pain? Yes   Pain Score 4    Pain Orientation Right   Pain Descriptors / Indicators Aching   Pain Type Surgical pain   Pain Onset More than a month ago   Pain Frequency Intermittent                         [facility] Adult PT Treatment/Exercise - 06/14/16 0001    Elbow Exercises   Elbow Flexion Strengthening;Right  3x10 reps   Bar Weights/Barbell (Elbow Flexion) 5 lbs   Elbow Extension Strengthening;Standing  Pink XTS 3x15 rep   Shoulder Exercises: Prone   Retraction Strengthening;Right;Weights  3x15 reps   Retraction Weight (lbs) 5   Extension Strengthening;Right;Weights  3x15 reps   Extension Weight (lbs) 5   Shoulder Exercises: Standing   Protraction Strengthening  XTS pink presses 3x20   Row Strengthening;Right;Left  XTS pink 6x10 with pron., neutral ,and sup. grips   Shoulder Exercises: Therapy Ball   Other Therapy Ball Exercises 2# ball reaches  to simulate  raising  IV bag    Shoulder Exercises: ROM/Strengthening   UBE (Upper Arm Bike) 120 RPM x 41mn with directional changes   Wall Pushups 10 reps                  PT Short Term Goals - 03/08/16 1140    PT SHORT TERM GOAL #1   Title I with inital HEP   Time 2   Period Weeks   Status Achieved   PT SHORT TERM GOAL #2   Title decrease pain in R shoulder by 25% with ADLS.   Time 2   Period Weeks   Status Achieved  03/08/16            PT Long Term Goals - 05/31/16 0850    PT LONG TERM GOAL #1   Title Able to perform ADLs including work requirements with R shoulder pain 2/10 or less.   Time 4   Period Weeks   Status On-going  Pain up to 5-6/10 with ADL's and has not  returned to work at this time 05/31/16   PT LONG TERM GOAL #2   Title Demo active R shoulder flex to 165 deg to improve function.   Time 4   Period Weeks   Status On-going  AROM 159 degrees 05/31/16   PT LONG TERM GOAL #3   Title Demo 5/5 R shoulder strength   Time 4   Period Weeks   Status On-going               Plan - 06/14/16 08756   Clinical Impression Statement Pt did fairly well today with Rx. We tried to [place] some of [place]. She had some increased discomfort after Rx, but did well.   Rehab Potential Excellent   Clinical Impairments Affecting Rehab Potential 01/20/16 surgery current 18 weeks 05/25/16   PT Frequency 3x / week   PT Duration 4 weeks   PT Treatment/Interventions ADLs/Self Care Home Management;Electrical Stimulation;Cryotherapy;Moist Heat;Therapeutic exercise;Therapeutic activities;Ultrasound;Neuromuscular re-education;Patient/family education;Manual techniques;Passive range of motion;Vasopneumatic Device   PT Next Visit Plan cont with POC per MD/MPT   continue with PT work simulation   PT Home Exercise Plan shoulder isometrics   Consulted and Agree with Plan of Care Patient      Patient will benefit from skilled therapeutic intervention in order to improve the following deficits and impairments:  Pain, Decreased range of motion, Decreased activity tolerance, Decreased strength, Impaired UE functional use  Visit Diagnosis: Right shoulder pain  Stiffness of right shoulder, not elsewhere classified     Problem List Patient Active Problem List   Diagnosis Date Noted  . Diverticulitis large intestine 01/07/2015  . Diverticulitis 07/03/2014  . Hypokalemia 07/03/2014  . Tobacco abuse   . Obesity   . Upper airway cough syndrome 01/28/2014  . IBS (irritable bowel syndrome) 07/10/2013  . Acute bilateral lower abdominal pain 03/26/2013  . Tiredness 03/26/2013  . Sigmoid diverticulitis  09/12/2012  . Epigastric pain 05/22/2012  . Nausea and vomiting 01/28/2012  . Crohn's disease of ileum ([place] 01/10/2012  . GERD (gastroesophageal reflux disease) 01/10/2012  . HTN (hypertension) 01/10/2012    Gaetana Kawahara,CHRIS, PTA 06/14/2016, 9:11 AM  [facility] [address]Phone: [phone]  Fax:  [phone] Name: GJARIA CONWAYMRN: [id number] of Birth: [birth year]

## 2016-06-18 ENCOUNTER — Encounter: Payer: Self-pay | Admitting: Physical Therapy

## 2016-06-18 ENCOUNTER — Ambulatory Visit: Payer: BLUE CROSS/BLUE SHIELD | Admitting: Physical Therapy

## 2016-06-18 DIAGNOSIS — M25611 Stiffness of right shoulder, not elsewhere classified: Secondary | ICD-10-CM

## 2016-06-18 DIAGNOSIS — M25511 Pain in right shoulder: Secondary | ICD-10-CM

## 2016-06-18 NOTE — Therapy (Signed)
[facility] [place], [place], [address] Phone: [phone]   Fax:  [phone]  Physical Therapy Treatment  Patient Details  Name: Elizabeth [name] MRN: [id number] Date of Birth: Apr 11, 1961 Referring Provider: Meredith Pel MD.  Encounter Date: 06/18/2016      PT End of Session - 06/18/16 0827    Visit Number 37   Number of Visits 51   Date for PT Re-Evaluation 07/21/16   PT Start Time 0815   PT Stop Time 7902   PT Time Calculation (min) 40 min   Activity Tolerance Patient tolerated treatment well   Behavior During Therapy [facility] for tasks assessed/performed      Past Medical History  Diagnosis Date  . Crohn's disease ([place])   . Elevated transaminase level   . Hypertension   . Bronchitis   . Cough 01/28/2014    upper airway cough syndrome  . Diverticulitis   . Tobacco abuse   . Obesity   . GERD (gastroesophageal reflux disease)   . Pneumonia 1/15    Past Surgical History  Procedure Laterality Date  . Hemocolectomy  2005    right  . Abdominal hysterectomy  1980  . Appendectomy    . Colonoscopy    . Esophagogastroduodenoscopy  06/16/2012    Procedure: ESOPHAGOGASTRODUODENOSCOPY (EGD);  Surgeon: Rogene Houston, MD;  [facility];  Service: Endoscopy;  Laterality: N/A;  10:30 AM  . Colonoscopy N/A 08/16/2014    Procedure: COLONOSCOPY;  Surgeon: Rogene Houston, MD;  [facility];  Service: Endoscopy;  Laterality: N/A;  1200-rescheduled to 9/4 @ 10:45 Ann notified pt  . Laparoscopic sigmoid colectomy N/A 01/07/2015    Procedure: LOW ANTERIOR COLON RESECTION;  Surgeon: Fanny Skates, MD;  Location: [place];  Service: General;  Laterality: N/A;  . Shoulder arthroscopy with labral repair Right 01/20/2016    Procedure: SHOULDER ARTHROSCOPY WITH LABRAL REPAIR;  Surgeon: Meredith Pel, MD;  Location: [facility];  Service: Orthopedics;  Laterality: Right;    There were no vitals filed for this visit.      Subjective Assessment - 06/18/16 0826    Subjective Reports that her shoulder is a little better but still hurts mostly at night.   Pertinent History HTN, Crohns Disease   Diagnostic tests mri, xray; results: mod RC tendinopathy/tendinosis, shallow tears of supraspinatus (no full thickness tears); ant/inf labral tear; mod GH degenerative changes   Patient Stated Goals Get back to work.   Currently in Pain? Yes   Pain Score 2    Pain Location Shoulder   Pain Orientation Right   Pain Type Surgical pain   Pain Onset More than a month ago   Pain Frequency Intermittent  with movement            [facility] PT Assessment - 06/18/16 0001    Assessment   Medical Diagnosis Right shoulder pain.   Onset Date/Surgical Date 01/20/16   Hand Dominance Right   Next MD Visit 08/09/2016                     [facility] Adult PT Treatment/Exercise - 06/18/16 0001    Elbow Exercises   Elbow Flexion Strengthening;Right   Bar Weights/Barbell (Elbow Flexion) 5 lbs   Elbow Extension Strengthening;Both;Standing  3x15 reps Pink XTS   Shoulder Exercises: Prone   Retraction Strengthening;Right;Weights  3x15 reps   Retraction Weight (lbs) 5   Extension Strengthening;Right;Weights  3x15 reps   Extension  Weight (lbs) 5   Shoulder Exercises: Standing   Protraction Strengthening;Both  3x15 reps with [employer];Both  neutral, pronated, supinated grip 3x15 reps Pink XTS   Shoulder Exercises: Therapy Ball   Other Therapy Ball Exercises 2# ball reaches   Shoulder Exercises: ROM/Strengthening   UBE (Upper Arm Bike) 120 RPM x 3mn with directional changes   Wall Wash 3x15 reps   Wall Pushups Other (comment)  3x15 reps   Ball on Wall Red theraball 3x15 reps                  PT Short Term Goals - 03/08/16 1140    PT SHORT TERM GOAL #1   Title I with inital HEP   Time 2   Period Weeks   Status Achieved   PT SHORT TERM GOAL #2   Title decrease pain in R shoulder  by 25% with ADLS.   Time 2   Period Weeks   Status Achieved  03/08/16            PT Long Term Goals - 05/31/16 0850    PT LONG TERM GOAL #1   Title Able to perform ADLs including work requirements with R shoulder pain 2/10 or less.   Time 4   Period Weeks   Status On-going  Pain up to 5-6/10 with ADL's and has not returned to work at this time 05/31/16   PT LONG TERM GOAL #2   Title Demo active R shoulder flex to 165 deg to improve function.   Time 4   Period Weeks   Status On-going  AROM 159 degrees 05/31/16   PT LONG TERM GOAL #3   Title Demo 5/5 R shoulder strength   Time 4   Period Weeks   Status On-going               Plan - 06/18/16 0855    Clinical Impression Statement Patient tolerated today's treatment fairly well as she arrived with low level R shoulder discomfort and reported no increased pain only fatigue. Patient able to tolerate and complete more work related activities with greater repititions now. Patient's R shoulder began popping during wall pushups, wall wash activities per patient report and popping was audibly noted 1-2 times. Patient experienced R shoulder fatigue upon end of treatment today.   Rehab Potential Excellent   Clinical Impairments Affecting Rehab Potential 01/20/16 surgery current 18 weeks 05/25/16   PT Frequency 3x / week   PT Duration 4 weeks   PT Treatment/Interventions ADLs/Self Care Home Management;Electrical Stimulation;Cryotherapy;Moist Heat;Therapeutic exercise;Therapeutic activities;Ultrasound;Neuromuscular re-education;Patient/family education;Manual techniques;Passive range of motion;Vasopneumatic Device   PT Next Visit Plan cont with POC per MD/MPT   continue with PT work simulation   PT Home Exercise Plan shoulder isometrics   Consulted and Agree with Plan of Care Patient      Patient will benefit from skilled therapeutic intervention in order to improve the following deficits and impairments:  Pain, Decreased range of  motion, Decreased activity tolerance, Decreased strength, Impaired UE functional use  Visit Diagnosis: Right shoulder pain  Stiffness of right shoulder, not elsewhere classified     Problem List Patient Active Problem List   Diagnosis Date Noted  . Diverticulitis large intestine 01/07/2015  . Diverticulitis 07/03/2014  . Hypokalemia 07/03/2014  . Tobacco abuse   . Obesity   . Upper airway cough syndrome 01/28/2014  . IBS (irritable bowel syndrome) 07/10/2013  . Acute bilateral lower abdominal pain 03/26/2013  .  Tiredness 03/26/2013  . Sigmoid diverticulitis 09/12/2012  . Epigastric pain 05/22/2012  . Nausea and vomiting 01/28/2012  . Crohn's disease of ileum ([place]) 01/10/2012  . GERD (gastroesophageal reflux disease) 01/10/2012  . HTN (hypertension) 01/10/2012    Wynelle Fanny, PTA 06/18/2016, 8:58 AM  [facility] [address] [place], [place], [address] Phone: [phone]   Fax:  [phone]  Name: Elizabeth [name] MRN: [id number] Date of Birth: 1961-11-08

## 2016-06-21 ENCOUNTER — Ambulatory Visit: Payer: BLUE CROSS/BLUE SHIELD | Admitting: Physical Therapy

## 2016-06-21 DIAGNOSIS — M25511 Pain in right shoulder: Secondary | ICD-10-CM | POA: Diagnosis not present

## 2016-06-21 DIAGNOSIS — M25611 Stiffness of right shoulder, not elsewhere classified: Secondary | ICD-10-CM

## 2016-06-21 NOTE — Therapy (Signed)
[facility] [place], [place], [address] Phone: [phone]   Fax:  [phone]  Physical Therapy Treatment  Patient Details  Name: Elizabeth [name] MRN: [id number] Date of Birth: 21-Feb-1961 Referring Provider: Meredith Pel MD.  Encounter Date: 06/21/2016      PT End of Session - 06/21/16 0821    Visit Number 38   Number of Visits 51   Date for PT Re-Evaluation 07/21/16   PT Start Time 0815   PT Stop Time 0900   PT Time Calculation (min) 45 min   Behavior During Therapy [facility] for tasks assessed/performed      Past Medical History  Diagnosis Date  . Crohn's disease ([place])   . Elevated transaminase level   . Hypertension   . Bronchitis   . Cough 01/28/2014    upper airway cough syndrome  . Diverticulitis   . Tobacco abuse   . Obesity   . GERD (gastroesophageal reflux disease)   . Pneumonia 1/15    Past Surgical History  Procedure Laterality Date  . Hemocolectomy  2005    right  . Abdominal hysterectomy  1980  . Appendectomy    . Colonoscopy    . Esophagogastroduodenoscopy  06/16/2012    Procedure: ESOPHAGOGASTRODUODENOSCOPY (EGD);  Surgeon: Rogene Houston, MD;  [facility];  Service: Endoscopy;  Laterality: N/A;  10:30 AM  . Colonoscopy N/A 08/16/2014    Procedure: COLONOSCOPY;  Surgeon: Rogene Houston, MD;  [facility];  Service: Endoscopy;  Laterality: N/A;  1200-rescheduled to 9/4 @ 10:45 Ann notified pt  . Laparoscopic sigmoid colectomy N/A 01/07/2015    Procedure: LOW ANTERIOR COLON RESECTION;  Surgeon: Fanny Skates, MD;  Location: [place];  Service: General;  Laterality: N/A;  . Shoulder arthroscopy with labral repair Right 01/20/2016    Procedure: SHOULDER ARTHROSCOPY WITH LABRAL REPAIR;  Surgeon: Meredith Pel, MD;  Location: [place];  Service: Orthopedics;  Laterality: Right;    There were no vitals filed for this visit.      Subjective Assessment - 06/21/16 0819    Subjective Patient reports she went swimming, doing breast stroke, and now she can hardly move it without pain.   Pertinent History HTN, Crohns Disease   Patient Stated Goals Get back to work.   Currently in Pain? Yes   Pain Score 4    Pain Location Shoulder   Pain Orientation Right   Pain Descriptors / Indicators Aching   Pain Type Surgical pain   Pain Onset More than a month ago   Pain Frequency Intermittent   Aggravating Factors  prolonged use   Pain Relieving Factors rest   Effect of Pain on Daily Activities difficulty working                         [facility] Adult PT Treatment/Exercise - 06/21/16 0001    Shoulder Exercises: Supine   Other Supine Exercises PNF D1/D2 flex/ext x 20 each   Shoulder Exercises: Prone   Horizontal ABduction 1 Strengthening;Right;10 reps  3 sets   Shoulder Exercises: Sidelying   Other Sidelying Exercises empty can and Y position x 20 each   Shoulder Exercises: Standing   Protraction Strengthening;Both;15 reps  pink xts, 3 sets   External Rotation Strengthening;Both;20 reps  against wall   Row Strengthening;Both  neutral, pronated, supinated grip 3x15 reps Pink XTS   Other Standing Exercises lat pull down, straight arms pink xts x  20   Shoulder Exercises: Therapy Ball   Other Therapy Ball Exercises 2# ball reaches 4 x 15   Shoulder Exercises: ROM/Strengthening   UBE (Upper Arm Bike) 120 RPM x 28mn with directional changes   Wall Wash to fatigue CW/CCW   Wall Pushups Other (comment)  3x15 reps                  PT Short Term Goals - 03/08/16 1140    PT SHORT TERM GOAL #1   Title I with inital HEP   Time 2   Period Weeks   Status Achieved   PT SHORT TERM GOAL #2   Title decrease pain in R shoulder by 25% with ADLS.   Time 2   Period Weeks   Status Achieved  03/08/16            PT Long Term Goals - 05/31/16 0850    PT LONG TERM GOAL #1   Title Able to perform ADLs including work requirements with R  shoulder pain 2/10 or less.   Time 4   Period Weeks   Status On-going  Pain up to 5-6/10 with ADL's and has not returned to work at this time 05/31/16   PT LONG TERM GOAL #2   Title Demo active R shoulder flex to 165 deg to improve function.   Time 4   Period Weeks   Status On-going  AROM 159 degrees 05/31/16   PT LONG TERM GOAL #3   Title Demo 5/5 R shoulder strength   Time 4   Period Weeks   Status On-going               Plan - 06/21/16 0955    Clinical Impression Statement Patient did well with therex today. She continues to have popping in shoulder with certain exercises. She still has poor scapular control with some exercises and will benefit from further mid, lower trap and shoulder ER exercises. Additionally, she has marked tenderness in R lateral scaular muscles and pecs. Goals are ongoing.   Rehab Potential Excellent   Clinical Impairments Affecting Rehab Potential 01/20/16 surgery current 18 weeks 05/25/16   PT Frequency 3x / week   PT Duration 4 weeks   PT Treatment/Interventions ADLs/Self Care Home Management;Electrical Stimulation;Cryotherapy;Moist Heat;Therapeutic exercise;Therapeutic activities;Ultrasound;Neuromuscular re-education;Patient/family education;Manual techniques;Passive range of motion;Vasopneumatic Device   PT Next Visit Plan Manual therapy (deep) to R pecs and lateral scapula; continue strenghtening to mid/lower traps and shoulder External rotators, functional strengthening.   Consulted and Agree with Plan of Care Patient      Patient will benefit from skilled therapeutic intervention in order to improve the following deficits and impairments:  Pain, Decreased range of motion, Decreased activity tolerance, Decreased strength, Impaired UE functional use  Visit Diagnosis: Right shoulder pain  Stiffness of right shoulder, not elsewhere classified     Problem List Patient Active Problem List   Diagnosis Date Noted  . Diverticulitis large  intestine 01/07/2015  . Diverticulitis 07/03/2014  . Hypokalemia 07/03/2014  . Tobacco abuse   . Obesity   . Upper airway cough syndrome 01/28/2014  . IBS (irritable bowel syndrome) 07/10/2013  . Acute bilateral lower abdominal pain 03/26/2013  . Tiredness 03/26/2013  . Sigmoid diverticulitis 09/12/2012  . Epigastric pain 05/22/2012  . Nausea and vomiting 01/28/2012  . Crohn's disease of ileum ([place] 01/10/2012  . GERD (gastroesophageal reflux disease) 01/10/2012  . HTN (hypertension) 01/10/2012    JMadelyn FlavorsPT  06/21/2016, 9:59 AM  [facility] [place], [place], [address] Phone: [phone]   Fax:  [phone]  Name: Elizabeth [name] MRN: [id number] Date of Birth: 08/24/1961

## 2016-06-25 ENCOUNTER — Ambulatory Visit: Payer: BLUE CROSS/BLUE SHIELD | Admitting: *Deleted

## 2016-06-25 DIAGNOSIS — M25511 Pain in right shoulder: Secondary | ICD-10-CM | POA: Diagnosis not present

## 2016-06-25 DIAGNOSIS — M25611 Stiffness of right shoulder, not elsewhere classified: Secondary | ICD-10-CM

## 2016-06-25 NOTE — Therapy (Signed)
[facility] [place], [place], [address] Phone: [phone]   Fax:  [phone]  Physical Therapy Treatment  Patient Details  Name: Elizabeth [name] MRN: [id number] Date of Birth: 08-18-1961 Referring Provider: Meredith Pel MD.  Encounter Date: 06/25/2016      PT End of Session - 06/25/16 0848    Visit Number 39   Number of Visits 51   Date for PT Re-Evaluation 07/21/16   PT Start Time 0815   PT Stop Time 0901   PT Time Calculation (min) 46 min      Past Medical History  Diagnosis Date  . Crohn's disease ([place])   . Elevated transaminase level   . Hypertension   . Bronchitis   . Cough 01/28/2014    upper airway cough syndrome  . Diverticulitis   . Tobacco abuse   . Obesity   . GERD (gastroesophageal reflux disease)   . Pneumonia 1/15    Past Surgical History  Procedure Laterality Date  . Hemocolectomy  2005    right  . Abdominal hysterectomy  1980  . Appendectomy    . Colonoscopy    . Esophagogastroduodenoscopy  06/16/2012    Procedure: ESOPHAGOGASTRODUODENOSCOPY (EGD);  Surgeon: Rogene Houston, MD;  [facility];  Service: Endoscopy;  Laterality: N/A;  10:30 AM  . Colonoscopy N/A 08/16/2014    Procedure: COLONOSCOPY;  Surgeon: Rogene Houston, MD;  [facility];  Service: Endoscopy;  Laterality: N/A;  1200-rescheduled to 9/4 @ 10:45 Ann notified pt  . Laparoscopic sigmoid colectomy N/A 01/07/2015    Procedure: LOW ANTERIOR COLON RESECTION;  Surgeon: Fanny Skates, MD;  Location: [place];  Service: General;  Laterality: N/A;  . Shoulder arthroscopy with labral repair Right 01/20/2016    Procedure: SHOULDER ARTHROSCOPY WITH LABRAL REPAIR;  Surgeon: Meredith Pel, MD;  Location: [place];  Service: Orthopedics;  Laterality: Right;    There were no vitals filed for this visit.                       [facility] Adult PT Treatment/Exercise - 06/25/16 0001    Elbow Exercises   Elbow Flexion Strengthening;Right   Bar Weights/Barbell (Elbow Flexion) 5 lbs   Elbow Extension Strengthening;Both;Standing  3x15 reps Pink XTS   Shoulder Exercises: Supine   Other Supine Exercises PNF D1/D2 flex/ext x 20 each   2# ball   Shoulder Exercises: Prone   Retraction Strengthening;Right;Weights  3x15 reps   Retraction Weight (lbs) 5   Extension Strengthening;Right;Weights  3x15 reps   Extension Weight (lbs) 5   Shoulder Exercises: Standing   Row Strengthening;Both  neutral, pronated, supinated grip 3x15 reps Pink XTS   Shoulder Exercises: ROM/Strengthening   UBE (Upper Arm Bike) 120 RPM x 34mn with directional changes   Wall Wash to fatigue CW/CCW   Wall Pushups Other (comment)  3x15 reps                  PT Short Term Goals - 03/08/16 1140    PT SHORT TERM GOAL #1   Title I with inital HEP   Time 2   Period Weeks   Status Achieved   PT SHORT TERM GOAL #2   Title decrease pain in R shoulder by 25% with ADLS.   Time 2   Period Weeks   Status Achieved  03/08/16            PT Long Term Goals -  05/31/16 0850    PT LONG TERM GOAL #1   Title Able to perform ADLs including work requirements with R shoulder pain 2/10 or less.   Time 4   Period Weeks   Status On-going  Pain up to 5-6/10 with ADL's and has not returned to work at this time 05/31/16   PT LONG TERM GOAL #2   Title Demo active R shoulder flex to 165 deg to improve function.   Time 4   Period Weeks   Status On-going  AROM 159 degrees 05/31/16   PT LONG TERM GOAL #3   Title Demo 5/5 R shoulder strength   Time 4   Period Weeks   Status On-going               Plan - 06/25/16 0849    Clinical Impression Statement Pt did fairly well with RT shldr therex and act.'s. She still has popping with certain motions and exs and continues to have soreness around RT shldr. She continues to progress with strength and was mainly fatigued end of Rx. Goals are ongoing   Rehab Potential  Excellent   Clinical Impairments Affecting Rehab Potential 01/20/16 surgery current 18 weeks 05/25/16   PT Frequency 3x / week   PT Treatment/Interventions ADLs/Self Care Home Management;Electrical Stimulation;Cryotherapy;Moist Heat;Therapeutic exercise;Therapeutic activities;Ultrasound;Neuromuscular re-education;Patient/family education;Manual techniques;Passive range of motion;Vasopneumatic Device   PT Next Visit Plan Manual therapy (deep) to R pecs and lateral scapula; continue strenghtening to mid/lower traps and shoulder External rotators, functional strengthening.   PT Home Exercise Plan shoulder isometrics   Consulted and Agree with Plan of Care Patient      Patient will benefit from skilled therapeutic intervention in order to improve the following deficits and impairments:  Pain, Decreased range of motion, Decreased activity tolerance, Decreased strength, Impaired UE functional use  Visit Diagnosis: Right shoulder pain  Stiffness of right shoulder, not elsewhere classified     Problem List Patient Active Problem List   Diagnosis Date Noted  . Diverticulitis large intestine 01/07/2015  . Diverticulitis 07/03/2014  . Hypokalemia 07/03/2014  . Tobacco abuse   . Obesity   . Upper airway cough syndrome 01/28/2014  . IBS (irritable bowel syndrome) 07/10/2013  . Acute bilateral lower abdominal pain 03/26/2013  . Tiredness 03/26/2013  . Sigmoid diverticulitis 09/12/2012  . Epigastric pain 05/22/2012  . Nausea and vomiting 01/28/2012  . Crohn's disease of ileum ([place]) 01/10/2012  . GERD (gastroesophageal reflux disease) 01/10/2012  . HTN (hypertension) 01/10/2012    [place] Lebeau,CHRIS, PTA 06/25/2016, 9:16 AM  [facility] [place], [place], [address] Phone: [phone]   Fax:  [phone]  Name: EREN PUEBLA MRN: [id number] Date of Birth: [birth year]

## 2016-06-28 ENCOUNTER — Encounter: Payer: Self-pay | Admitting: Physical Therapy

## 2016-06-28 ENCOUNTER — Ambulatory Visit: Payer: BLUE CROSS/BLUE SHIELD | Admitting: Physical Therapy

## 2016-06-28 DIAGNOSIS — M25511 Pain in right shoulder: Secondary | ICD-10-CM | POA: Diagnosis not present

## 2016-06-28 DIAGNOSIS — M25611 Stiffness of right shoulder, not elsewhere classified: Secondary | ICD-10-CM

## 2016-06-28 NOTE — Therapy (Signed)
[facility] [place], [place], [address] Phone: [phone]   Fax:  [phone]  Physical Therapy Treatment  Patient Details  Name: Elizabeth [name] MRN: [id number] Date of Birth: 05/28/1961 Referring Provider: Meredith Pel MD.  Encounter Date: 06/28/2016      PT End of Session - 06/28/16 0815    Visit Number 40   Number of Visits 51   Date for PT Re-Evaluation 07/21/16   PT Start Time 0814   PT Stop Time 0858   PT Time Calculation (min) 44 min   Activity Tolerance Patient tolerated treatment well   Behavior During Therapy [facility] for tasks assessed/performed      Past Medical History  Diagnosis Date  . Crohn's disease ([place])   . Elevated transaminase level   . Hypertension   . Bronchitis   . Cough 01/28/2014    upper airway cough syndrome  . Diverticulitis   . Tobacco abuse   . Obesity   . GERD (gastroesophageal reflux disease)   . Pneumonia 1/15    Past Surgical History  Procedure Laterality Date  . Hemocolectomy  2005    right  . Abdominal hysterectomy  1980  . Appendectomy    . Colonoscopy    . Esophagogastroduodenoscopy  06/16/2012    Procedure: ESOPHAGOGASTRODUODENOSCOPY (EGD);  Surgeon: Rogene Houston, MD;  [facility];  Service: Endoscopy;  Laterality: N/A;  10:30 AM  . Colonoscopy N/A 08/16/2014    Procedure: COLONOSCOPY;  Surgeon: Rogene Houston, MD;  [facility];  Service: Endoscopy;  Laterality: N/A;  1200-rescheduled to 9/4 @ 10:45 Ann notified pt  . Laparoscopic sigmoid colectomy N/A 01/07/2015    Procedure: LOW ANTERIOR COLON RESECTION;  Surgeon: Fanny Skates, MD;  Location: [place];  Service: General;  Laterality: N/A;  . Shoulder arthroscopy with labral repair Right 01/20/2016    Procedure: SHOULDER ARTHROSCOPY WITH LABRAL REPAIR;  Surgeon: Meredith Pel, MD;  Location: [place];  Service: Orthopedics;  Laterality: Right;    There were no vitals filed for this  visit.      Subjective Assessment - 06/28/16 0815    Subjective Reports that she can swim with horizontal abduction UE movement but painful to swim with OH UE movement. Reports she has been shucking corn over the weekend.   Pertinent History HTN, Crohns Disease   Diagnostic tests mri, xray; results: mod RC tendinopathy/tendinosis, shallow tears of supraspinatus (no full thickness tears); ant/inf labral tear; mod GH degenerative changes   Patient Stated Goals Get back to work.   Currently in Pain? Other (Comment)  Gave no numerical pain rating upon arrival at treatment.            [facility] PT Assessment - 06/28/16 0001    Assessment   Medical Diagnosis Right shoulder pain.   Onset Date/Surgical Date 01/20/16   Hand Dominance Right   Next MD Visit 08/09/2016                     [facility] Adult PT Treatment/Exercise - 06/28/16 0001    Elbow Exercises   Elbow Flexion Strengthening;Right  3x15 reps   Bar Weights/Barbell (Elbow Flexion) 5 lbs   Elbow Extension Strengthening;Both;Standing  3x15 reps; Pink XTS   Shoulder Exercises: Supine   Other Supine Exercises RUE D2 2# ball x20 reps   Shoulder Exercises: Prone   Retraction Strengthening;Right;Weights  3x15 reps   Retraction Weight (lbs) 5   Extension Strengthening;Right;Weights  3x15 reps   Extension Weight (lbs) 5   Shoulder Exercises: Standing   Row Strengthening;Both  3D pink XTS 3x15 reps   Shoulder Exercises: ROM/Strengthening   UBE (Upper Arm Bike) 90 RPM x10 min   Manual Therapy   Manual Therapy Myofascial release   Myofascial Release MFR/STW to R lateral scapular muscles and lateral R Pectoralis Major to decrease tightness                  PT Short Term Goals - 03/08/16 1140    PT SHORT TERM GOAL #1   Title I with inital HEP   Time 2   Period Weeks   Status Achieved   PT SHORT TERM GOAL #2   Title decrease pain in R shoulder by 25% with ADLS.   Time 2   Period Weeks   Status Achieved   03/08/16            PT Long Term Goals - 05/31/16 0850    PT LONG TERM GOAL #1   Title Able to perform ADLs including work requirements with R shoulder pain 2/10 or less.   Time 4   Period Weeks   Status On-going  Pain up to 5-6/10 with ADL's and has not returned to work at this time 05/31/16   PT LONG TERM GOAL #2   Title Demo active R shoulder flex to 165 deg to improve function.   Time 4   Period Weeks   Status On-going  AROM 159 degrees 05/31/16   PT LONG TERM GOAL #3   Title Demo 5/5 R shoulder strength   Time 4   Period Weeks   Status On-going               Plan - 06/28/16 0910    Clinical Impression Statement Patient tolerated today's treatment fairly well as she continues to report popping and fatigue with RUE actvities. Patient presented in clinic with notable tightness of R lateral scapular muscles and R lateral Pectoralis Major. Patient able to continue UE strengthening exercises with no real reports of RUE pain. Patient did not report any RUE pain per patient report upon end of treatment.   Rehab Potential Excellent   Clinical Impairments Affecting Rehab Potential 01/20/16 surgery current 18 weeks 05/25/16   PT Frequency 3x / week   PT Duration 4 weeks   PT Treatment/Interventions ADLs/Self Care Home Management;Electrical Stimulation;Cryotherapy;Moist Heat;Therapeutic exercise;Therapeutic activities;Ultrasound;Neuromuscular re-education;Patient/family education;Manual techniques;Passive range of motion;Vasopneumatic Device   PT Next Visit Plan Manual therapy (deep) to R pecs and lateral scapula; continue strenghtening to mid/lower traps and shoulder External rotators, functional strengthening.   Consulted and Agree with Plan of Care Patient      Patient will benefit from skilled therapeutic intervention in order to improve the following deficits and impairments:  Pain, Decreased range of motion, Decreased activity tolerance, Decreased strength, Impaired UE  functional use  Visit Diagnosis: Right shoulder pain  Stiffness of right shoulder, not elsewhere classified  Decreased range of motion of right shoulder     Problem List Patient Active Problem List   Diagnosis Date Noted  . Diverticulitis large intestine 01/07/2015  . Diverticulitis 07/03/2014  . Hypokalemia 07/03/2014  . Tobacco abuse   . Obesity   . Upper airway cough syndrome 01/28/2014  . IBS (irritable bowel syndrome) 07/10/2013  . Acute bilateral lower abdominal pain 03/26/2013  . Tiredness 03/26/2013  . Sigmoid diverticulitis 09/12/2012  . Epigastric pain 05/22/2012  . Nausea and vomiting 01/28/2012  .  Crohn's disease of ileum ([place]) 01/10/2012  . GERD (gastroesophageal reflux disease) 01/10/2012  . HTN (hypertension) 01/10/2012    Wynelle Fanny, PTA 06/28/2016, 9:15 AM  [facility] [address] [place], [place], [address] Phone: [phone]   Fax:  [phone]  Name: Elizabeth [name] MRN: [id number] Date of Birth: [birth year]

## 2016-07-02 ENCOUNTER — Ambulatory Visit: Payer: BLUE CROSS/BLUE SHIELD | Admitting: *Deleted

## 2016-07-02 DIAGNOSIS — M25511 Pain in right shoulder: Secondary | ICD-10-CM | POA: Diagnosis not present

## 2016-07-02 DIAGNOSIS — M25611 Stiffness of right shoulder, not elsewhere classified: Secondary | ICD-10-CM

## 2016-07-02 NOTE — Therapy (Signed)
[facility] [place], [place], [address] Phone: [phone]   Fax:  [phone]  Physical Therapy Treatment  Patient Details  Name: Elizabeth [name] MRN: [id number] Date of Birth: 25-Feb-1961 Referring Provider: Meredith Pel MD.  Encounter Date: 07/02/2016      PT End of Session - 07/02/16 0824    Visit Number 41   Number of Visits 51   Date for PT Re-Evaluation 07/21/16   PT Start Time 0815   PT Stop Time 0901   PT Time Calculation (min) 46 min      Past Medical History  Diagnosis Date  . Crohn's disease ([place])   . Elevated transaminase level   . Hypertension   . Bronchitis   . Cough 01/28/2014    upper airway cough syndrome  . Diverticulitis   . Tobacco abuse   . Obesity   . GERD (gastroesophageal reflux disease)   . Pneumonia 1/15    Past Surgical History  Procedure Laterality Date  . Hemocolectomy  2005    right  . Abdominal hysterectomy  1980  . Appendectomy    . Colonoscopy    . Esophagogastroduodenoscopy  06/16/2012    Procedure: ESOPHAGOGASTRODUODENOSCOPY (EGD);  Surgeon: Rogene Houston, MD;  [facility];  Service: Endoscopy;  Laterality: N/A;  10:30 AM  . Colonoscopy N/A 08/16/2014    Procedure: COLONOSCOPY;  Surgeon: Rogene Houston, MD;  [facility];  Service: Endoscopy;  Laterality: N/A;  1200-rescheduled to 9/4 @ 10:45 Ann notified pt  . Laparoscopic sigmoid colectomy N/A 01/07/2015    Procedure: LOW ANTERIOR COLON RESECTION;  Surgeon: Fanny Skates, MD;  Location: [place];  Service: General;  Laterality: N/A;  . Shoulder arthroscopy with labral repair Right 01/20/2016    Procedure: SHOULDER ARTHROSCOPY WITH LABRAL REPAIR;  Surgeon: Meredith Pel, MD;  Location: [place];  Service: Orthopedics;  Laterality: Right;    There were no vitals filed for this visit.      Subjective Assessment - 07/02/16 0822    Subjective Rt shldr is feeling some better today. minimal pain   Pertinent History HTN, Crohns Disease   Diagnostic tests mri, xray; results: mod RC tendinopathy/tendinosis, shallow tears of supraspinatus (no full thickness tears); ant/inf labral tear; mod GH degenerative changes   Patient Stated Goals Get back to work.   Currently in Pain? Yes   Pain Score 1    Pain Location Shoulder   Pain Orientation Right   Pain Descriptors / Indicators Aching   Pain Type Surgical pain   Pain Onset More than a month ago   Pain Frequency Intermittent                         [facility] Adult PT Treatment/Exercise - 07/02/16 0001    Elbow Exercises   Elbow Flexion Strengthening;Right  3x15 reps   Bar Weights/Barbell (Elbow Flexion) 5 lbs   Elbow Extension Strengthening;Both;Standing  3x15 reps; Pink XTS   Shoulder Exercises: Supine   Other Supine Exercises RUE D2 2# ball x20 reps   Shoulder Exercises: Prone   Retraction Strengthening;Right;Weights  3x15 reps   Retraction Weight (lbs) 5   Extension Strengthening;Right;Weights  3x15 reps   Extension Weight (lbs) 5   Shoulder Exercises: Standing   Row Strengthening;Both  3D pink XTS 3x15 reps   Shoulder Exercises: ROM/Strengthening   UBE (Upper Arm Bike) 90 RPM x10 min   Manual Therapy   Manual  Therapy Myofascial release   Myofascial Release MFR/STW to R lateral border scapular muscles and  R Pectoralis Major to decrease tightness                  PT Short Term Goals - 03/08/16 1140    PT SHORT TERM GOAL #1   Title I with inital HEP   Time 2   Period Weeks   Status Achieved   PT SHORT TERM GOAL #2   Title decrease pain in R shoulder by 25% with ADLS.   Time 2   Period Weeks   Status Achieved  03/08/16            PT Long Term Goals - 05/31/16 0850    PT LONG TERM GOAL #1   Title Able to perform ADLs including work requirements with R shoulder pain 2/10 or less.   Time 4   Period Weeks   Status On-going  Pain up to 5-6/10 with ADL's and has not returned to work at  this time 05/31/16   PT LONG TERM GOAL #2   Title Demo active R shoulder flex to 165 deg to improve function.   Time 4   Period Weeks   Status On-going  AROM 159 degrees 05/31/16   PT LONG TERM GOAL #3   Title Demo 5/5 R shoulder strength   Time 4   Period Weeks   Status On-going               Plan - 07/02/16 2536    Clinical Impression Statement Pt did fairly well with Therex today for RT shldr strengthening. She still has increased pain with Abduction motion, but does well with elevation. Her elevation ROM was 158  degrees today  (LTG Not  met) . Tight tissues still notable along lateral border of RT scapula, but  had good releases with STW   Rehab Potential Excellent   Clinical Impairments Affecting Rehab Potential 01/20/16 surgery current 18 weeks 05/25/16   PT Frequency 3x / week   PT Duration 4 weeks   PT Next Visit Plan Manual therapy (deep) to R pecs and lateral scapula; continue strenghtening to mid/lower traps and shoulder External rotators, functional strengthening.         2 visits next week and then MD note   Consulted and Agree with Plan of Care Patient      Patient will benefit from skilled therapeutic intervention in order to improve the following deficits and impairments:  Pain, Decreased range of motion, Decreased activity tolerance, Decreased strength, Impaired UE functional use  Visit Diagnosis: Right shoulder pain  Stiffness of right shoulder, not elsewhere classified     Problem List Patient Active Problem List   Diagnosis Date Noted  . Diverticulitis large intestine 01/07/2015  . Diverticulitis 07/03/2014  . Hypokalemia 07/03/2014  . Tobacco abuse   . Obesity   . Upper airway cough syndrome 01/28/2014  . IBS (irritable bowel syndrome) 07/10/2013  . Acute bilateral lower abdominal pain 03/26/2013  . Tiredness 03/26/2013  . Sigmoid diverticulitis 09/12/2012  . Epigastric pain 05/22/2012  . Nausea and vomiting 01/28/2012  . Crohn's disease of  ileum ([place]) 01/10/2012  . GERD (gastroesophageal reflux disease) 01/10/2012  . HTN (hypertension) 01/10/2012    Albertus Chiarelli,CHRIS, PTA 07/02/2016, 9:11 AM  [facility] [place], [place], [address] Phone: [phone]   Fax:  [phone]  Name: Elizabeth [name] MRN: [id number] Date of Birth: June 25, 1961

## 2016-07-05 ENCOUNTER — Encounter: Payer: Self-pay | Admitting: Physical Therapy

## 2016-07-05 ENCOUNTER — Ambulatory Visit: Payer: BLUE CROSS/BLUE SHIELD | Admitting: Physical Therapy

## 2016-07-05 DIAGNOSIS — M25511 Pain in right shoulder: Secondary | ICD-10-CM | POA: Diagnosis not present

## 2016-07-05 DIAGNOSIS — M25611 Stiffness of right shoulder, not elsewhere classified: Secondary | ICD-10-CM

## 2016-07-05 NOTE — Therapy (Signed)
[facility] [place], [place], [address] Phone: [phone]   Fax:  [phone]  Physical Therapy Treatment  Patient Details  Name: Elizabeth [name] MRN: [id number] Date of Birth: Aug 10, 1961 Referring Provider: Meredith Pel MD.  Encounter Date: 07/05/2016      PT End of Session - 07/05/16 0839    Visit Number 42   Number of Visits 51   Date for PT Re-Evaluation 07/21/16   PT Start Time 0814   PT Stop Time 0858   PT Time Calculation (min) 44 min   Activity Tolerance Patient tolerated treatment well   Behavior During Therapy [facility] for tasks assessed/performed      Past Medical History:  Diagnosis Date  . Bronchitis   . Cough 01/28/2014   upper airway cough syndrome  . Crohn's disease ([place])   . Diverticulitis   . Elevated transaminase level   . GERD (gastroesophageal reflux disease)   . Hypertension   . Obesity   . Pneumonia 1/15  . Tobacco abuse     Past Surgical History:  Procedure Laterality Date  . ABDOMINAL HYSTERECTOMY  1980  . APPENDECTOMY    . COLONOSCOPY    . COLONOSCOPY N/A 08/16/2014   Procedure: COLONOSCOPY;  Surgeon: Rogene Houston, MD;  [facility];  Service: Endoscopy;  Laterality: N/A;  1200-rescheduled to 9/4 @ 10:45 Ann notified pt  . ESOPHAGOGASTRODUODENOSCOPY  06/16/2012   Procedure: ESOPHAGOGASTRODUODENOSCOPY (EGD);  Surgeon: Rogene Houston, MD;  [facility];  Service: Endoscopy;  Laterality: N/A;  10:30 AM  . hemocolectomy  2005   right  . LAPAROSCOPIC SIGMOID COLECTOMY N/A 01/07/2015   Procedure: LOW ANTERIOR COLON RESECTION;  Surgeon: Fanny Skates, MD;  Location: [place];  Service: General;  Laterality: N/A;  . SHOULDER ARTHROSCOPY WITH LABRAL REPAIR Right 01/20/2016   Procedure: SHOULDER ARTHROSCOPY WITH LABRAL REPAIR;  Surgeon: Meredith Pel, MD;  Location: [place];  Service: Orthopedics;  Laterality: Right;    There were no vitals filed for this visit.      Subjective Assessment - 07/05/16 0819    Subjective Patient feeling no pain just some tightness today, and reported that her husband is doing the massage on her shoulder   Pertinent History HTN, Crohns Disease   Diagnostic tests mri, xray; results: mod RC tendinopathy/tendinosis, shallow tears of supraspinatus (no full thickness tears); ant/inf labral tear; mod GH degenerative changes   Patient Stated Goals Get back to work.   Currently in Pain? No/denies                         [facility] Adult PT Treatment/Exercise - 07/05/16 0001      Elbow Exercises   Elbow Flexion Strengthening;Right  3x15   Bar Weights/Barbell (Elbow Flexion) 5 lbs   Elbow Extension Strengthening;Both;Standing  3x15 with pink XTS     Shoulder Exercises: Supine   Other Supine Exercises RUE D2 2# ball x20 reps     Shoulder Exercises: Prone   Retraction Strengthening;Right;Weights  3x15   Retraction Weight (lbs) 5   Extension Strengthening;Right;Weights  3x15   Extension Weight (lbs) 5     Shoulder Exercises: Standing   Row Strengthening;Both  3x15 with pink XTS     Shoulder Exercises: ROM/Strengthening   UBE (Upper Arm Bike) 90 RPM x10 min     Manual Therapy   Manual Therapy Myofascial release   Myofascial Release MFR/STW to R lateral border scapular  muscles and  R Pectoralis Major to decrease tightness                  PT Short Term Goals - 03/08/16 1140      PT SHORT TERM GOAL #1   Title I with inital HEP   Time 2   Period Weeks   Status Achieved     PT SHORT TERM GOAL #2   Title decrease pain in R shoulder by 25% with ADLS.   Time 2   Period Weeks   Status Achieved  03/08/16            PT Long Term Goals - 07/05/16 0839      PT LONG TERM GOAL #1   Title Able to perform ADLs including work requirements with R shoulder pain 2/10 or less.   Time 4   Period Weeks   Status Partially Met  able to perform ADL's with no pain over 2/10 yet not back to work at  this time 07/05/16     PT LONG TERM GOAL #2   Title Demo active R shoulder flex to 165 deg to improve function.   Time 4   Period Weeks   Status On-going  AROM 145 degrees 07/05/16     PT LONG TERM GOAL #3   Title Demo 5/5 R shoulder strength   Time 4   Period Weeks   Status On-going               Plan - 07/05/16 4656    Clinical Impression Statement Patient progressing overall with no pain reports today. Patient reported 70% better overall and is able to perform ADL's with pain no more than 2/10. Patient goals due to strength deficits and full AROM deficit in standing.    Rehab Potential Excellent   Clinical Impairments Affecting Rehab Potential 01/20/16 surgery current 18 weeks 05/25/16   PT Frequency 3x / week   PT Duration 4 weeks   PT Treatment/Interventions ADLs/Self Care Home Management;Electrical Stimulation;Cryotherapy;Moist Heat;Therapeutic exercise;Therapeutic activities;Ultrasound;Neuromuscular re-education;Patient/family education;Manual techniques;Passive range of motion;Vasopneumatic Device   PT Next Visit Plan Manual therapy (deep) to R pecs and lateral scapula; continue strenghtening to mid/lower traps and shoulder External rotators, functional strengthening.        1 visit then MD note   Consulted and Agree with Plan of Care Patient      Patient will benefit from skilled therapeutic intervention in order to improve the following deficits and impairments:  Pain, Decreased range of motion, Decreased activity tolerance, Decreased strength, Impaired UE functional use  Visit Diagnosis: Right shoulder pain  Stiffness of right shoulder, not elsewhere classified     Problem List Patient Active Problem List   Diagnosis Date Noted  . Diverticulitis large intestine 01/07/2015  . Diverticulitis 07/03/2014  . Hypokalemia 07/03/2014  . Tobacco abuse   . Obesity   . Upper airway cough syndrome 01/28/2014  . IBS (irritable bowel syndrome) 07/10/2013  . Acute  bilateral lower abdominal pain 03/26/2013  . Tiredness 03/26/2013  . Sigmoid diverticulitis 09/12/2012  . Epigastric pain 05/22/2012  . Nausea and vomiting 01/28/2012  . Crohn's disease of ileum ([place]) 01/10/2012  . GERD (gastroesophageal reflux disease) 01/10/2012  . HTN (hypertension) 01/10/2012    Phillips Climes 07/05/2016, 8:59 AM  [facility] [place], [place], [address] Phone: [phone]   Fax:  [phone]  Name: Elizabeth [name] MRN: [id number] Date of Birth: Feb 20, 1961

## 2016-07-05 NOTE — Therapy (Signed)
[facility] [place], [place], [address] Phone: [phone]   Fax:  [phone]  Physical Therapy Treatment  Patient Details  Name: Elizabeth [name] MRN: [id number] Date of Birth: Aug 10, 1961 Referring Provider: Meredith Pel MD.  Encounter Date: 07/05/2016      PT End of Session - 07/05/16 0839    Visit Number 42   Number of Visits 51   Date for PT Re-Evaluation 07/21/16   PT Start Time 0814   PT Stop Time 0858   PT Time Calculation (min) 44 min   Activity Tolerance Patient tolerated treatment well   Behavior During Therapy [facility] for tasks assessed/performed      Past Medical History:  Diagnosis Date  . Bronchitis   . Cough 01/28/2014   upper airway cough syndrome  . Crohn's disease ([place])   . Diverticulitis   . Elevated transaminase level   . GERD (gastroesophageal reflux disease)   . Hypertension   . Obesity   . Pneumonia 1/15  . Tobacco abuse     Past Surgical History:  Procedure Laterality Date  . ABDOMINAL HYSTERECTOMY  1980  . APPENDECTOMY    . COLONOSCOPY    . COLONOSCOPY N/A 08/16/2014   Procedure: COLONOSCOPY;  Surgeon: Rogene Houston, MD;  [facility];  Service: Endoscopy;  Laterality: N/A;  1200-rescheduled to 9/4 @ 10:45 Ann notified pt  . ESOPHAGOGASTRODUODENOSCOPY  06/16/2012   Procedure: ESOPHAGOGASTRODUODENOSCOPY (EGD);  Surgeon: Rogene Houston, MD;  [facility];  Service: Endoscopy;  Laterality: N/A;  10:30 AM  . hemocolectomy  2005   right  . LAPAROSCOPIC SIGMOID COLECTOMY N/A 01/07/2015   Procedure: LOW ANTERIOR COLON RESECTION;  Surgeon: Fanny Skates, MD;  Location: [place];  Service: General;  Laterality: N/A;  . SHOULDER ARTHROSCOPY WITH LABRAL REPAIR Right 01/20/2016   Procedure: SHOULDER ARTHROSCOPY WITH LABRAL REPAIR;  Surgeon: Meredith Pel, MD;  Location: [place];  Service: Orthopedics;  Laterality: Right;    There were no vitals filed for this visit.      Subjective Assessment - 07/05/16 0819    Subjective Patient feeling no pain just some tightness today, and reported that her husband is doing the massage on her shoulder   Pertinent History HTN, Crohns Disease   Diagnostic tests mri, xray; results: mod RC tendinopathy/tendinosis, shallow tears of supraspinatus (no full thickness tears); ant/inf labral tear; mod GH degenerative changes   Patient Stated Goals Get back to work.   Currently in Pain? No/denies                         [facility] Adult PT Treatment/Exercise - 07/05/16 0001      Elbow Exercises   Elbow Flexion Strengthening;Right  3x15   Bar Weights/Barbell (Elbow Flexion) 5 lbs   Elbow Extension Strengthening;Both;Standing  3x15 with pink XTS     Shoulder Exercises: Supine   Other Supine Exercises RUE D2 2# ball x20 reps     Shoulder Exercises: Prone   Retraction Strengthening;Right;Weights  3x15   Retraction Weight (lbs) 5   Extension Strengthening;Right;Weights  3x15   Extension Weight (lbs) 5     Shoulder Exercises: Standing   Row Strengthening;Both  3x15 with pink XTS     Shoulder Exercises: ROM/Strengthening   UBE (Upper Arm Bike) 90 RPM x10 min     Manual Therapy   Manual Therapy Myofascial release   Myofascial Release MFR/STW to R lateral border scapular  muscles and  R Pectoralis Major to decrease tightness                  PT Short Term Goals - 03/08/16 1140      PT SHORT TERM GOAL #1   Title I with inital HEP   Time 2   Period Weeks   Status Achieved     PT SHORT TERM GOAL #2   Title decrease pain in R shoulder by 25% with ADLS.   Time 2   Period Weeks   Status Achieved  03/08/16            PT Long Term Goals - 07/05/16 0839      PT LONG TERM GOAL #1   Title Able to perform ADLs including work requirements with R shoulder pain 2/10 or less.   Time 4   Period Weeks   Status Partially Met  able to perform ADL's with no pain over 2/10 yet not back to work at  this time 07/05/16     PT LONG TERM GOAL #2   Title Demo active R shoulder flex to 165 deg to improve function.   Time 4   Period Weeks   Status On-going  AROM 145 degrees 07/05/16     PT LONG TERM GOAL #3   Title Demo 5/5 R shoulder strength   Time 4   Period Weeks   Status On-going               Plan - 07/05/16 0814    Clinical Impression Statement Patient progressing overall with no pain reports today. Patient reported 70% better overall and is able to perform ADL's with pain no more than 2/10. Patient goals due to strength deficits and full AROM deficit in standing.    Rehab Potential Excellent   Clinical Impairments Affecting Rehab Potential 01/20/16 surgery current 18 weeks 05/25/16   PT Frequency 3x / week   PT Duration 4 weeks   PT Treatment/Interventions ADLs/Self Care Home Management;Electrical Stimulation;Cryotherapy;Moist Heat;Therapeutic exercise;Therapeutic activities;Ultrasound;Neuromuscular re-education;Patient/family education;Manual techniques;Passive range of motion;Vasopneumatic Device   PT Next Visit Plan Manual therapy (deep) to R pecs and lateral scapula; continue strenghtening to mid/lower traps and shoulder External rotators, functional strengthening.        1 visit then MD note   Consulted and Agree with Plan of Care Patient      Patient will benefit from skilled therapeutic intervention in order to improve the following deficits and impairments:  Pain, Decreased range of motion, Decreased activity tolerance, Decreased strength, Impaired UE functional use  Visit Diagnosis: Right shoulder pain  Stiffness of right shoulder, not elsewhere classified     Problem List Patient Active Problem List   Diagnosis Date Noted  . Diverticulitis large intestine 01/07/2015  . Diverticulitis 07/03/2014  . Hypokalemia 07/03/2014  . Tobacco abuse   . Obesity   . Upper airway cough syndrome 01/28/2014  . IBS (irritable bowel syndrome) 07/10/2013  . Acute  bilateral lower abdominal pain 03/26/2013  . Tiredness 03/26/2013  . Sigmoid diverticulitis 09/12/2012  . Epigastric pain 05/22/2012  . Nausea and vomiting 01/28/2012  . Crohn's disease of ileum ([place]) 01/10/2012  . GERD (gastroesophageal reflux disease) 01/10/2012  . HTN (hypertension) 01/10/2012    Jerriah Ines P, PTA 07/05/2016, 8:47 AM  [facility] [place], [place], [address] Phone: [phone]   Fax:  [phone]  Name: Elizabeth [name] MRN: [id number] Date of Birth: 1961/07/08

## 2016-07-09 ENCOUNTER — Ambulatory Visit: Payer: BLUE CROSS/BLUE SHIELD | Admitting: *Deleted

## 2016-07-09 DIAGNOSIS — M25511 Pain in right shoulder: Secondary | ICD-10-CM

## 2016-07-09 DIAGNOSIS — M25611 Stiffness of right shoulder, not elsewhere classified: Secondary | ICD-10-CM

## 2016-07-09 NOTE — Therapy (Signed)
[facility] [place], [place], [address] Phone: [phone]   Fax:  [phone]  Physical Therapy Treatment  Patient Details  Name: Elizabeth [name] MRN: [id number] Date of Birth: 1961-10-26 Referring Provider: Meredith Pel MD.  Encounter Date: 07/09/2016      PT End of Session - 07/09/16 0925    Visit Number 43   Number of Visits 51   Date for PT Re-Evaluation 07/21/16   PT Start Time 0815   PT Stop Time 0909   PT Time Calculation (min) 54 min      Past Medical History:  Diagnosis Date  . Bronchitis   . Cough 01/28/2014   upper airway cough syndrome  . Crohn's disease ([place])   . Diverticulitis   . Elevated transaminase level   . GERD (gastroesophageal reflux disease)   . Hypertension   . Obesity   . Pneumonia 1/15  . Tobacco abuse     Past Surgical History:  Procedure Laterality Date  . ABDOMINAL HYSTERECTOMY  1980  . APPENDECTOMY    . COLONOSCOPY    . COLONOSCOPY N/A 08/16/2014   Procedure: COLONOSCOPY;  Surgeon: Rogene Houston, MD;  [facility];  Service: Endoscopy;  Laterality: N/A;  1200-rescheduled to 9/4 @ 10:45 Ann notified pt  . ESOPHAGOGASTRODUODENOSCOPY  06/16/2012   Procedure: ESOPHAGOGASTRODUODENOSCOPY (EGD);  Surgeon: Rogene Houston, MD;  [facility];  Service: Endoscopy;  Laterality: N/A;  10:30 AM  . hemocolectomy  2005   right  . LAPAROSCOPIC SIGMOID COLECTOMY N/A 01/07/2015   Procedure: LOW ANTERIOR COLON RESECTION;  Surgeon: Fanny Skates, MD;  Location: [facility];  Service: General;  Laterality: N/A;  . SHOULDER ARTHROSCOPY WITH LABRAL REPAIR Right 01/20/2016   Procedure: SHOULDER ARTHROSCOPY WITH LABRAL REPAIR;  Surgeon: Meredith Pel, MD;  Location: [facility];  Service: Orthopedics;  Laterality: Right;    There were no vitals filed for this visit.      Subjective Assessment - 07/09/16 0814    Subjective The last 2 nights pain has been 4-5/10. having to take  percocet just to sleep. To MD 08-09-16   Pertinent History HTN, Crohns Disease   Diagnostic tests mri, xray; results: mod RC tendinopathy/tendinosis, shallow tears of supraspinatus (no full thickness tears); ant/inf labral tear; mod GH degenerative changes   Patient Stated Goals Get back to work.   Currently in Pain? Yes   Pain Score 2    Pain Location Shoulder   Pain Orientation Right   Pain Descriptors / Indicators Aching   Pain Type Surgical pain   Pain Onset More than a month ago   Pain Frequency Intermittent   Aggravating Factors  Prolonged use                         [facility] Adult PT Treatment/Exercise - 07/09/16 0001      Elbow Exercises   Elbow Flexion Strengthening;Right  3x15   Bar Weights/Barbell (Elbow Flexion) 5 lbs   Elbow Extension --  3x15 with pink XTS     Shoulder Exercises: Supine   Other Supine Exercises --     Shoulder Exercises: Prone   Retraction Strengthening;Right;Weights  3x15   Retraction Weight (lbs) 5   Extension Strengthening;Right;Weights  3x15   Extension Weight (lbs) 5     Shoulder Exercises: Standing   Row --  3x15 with pink XTS   Other Standing Exercises RW4 exs with red band  Shoulder Exercises: ROM/Strengthening   UBE (Upper Arm Bike) 90 RPM x10 min     Modalities   Modalities Electrical Stimulation;Moist Heat;Ultrasound     Moist Heat Therapy   Number Minutes Moist Heat 15 Minutes   Moist Heat Location Shoulder     Electrical Stimulation   Electrical Stimulation Location R shoulder   Electrical Stimulation Action IFC x 15 mins 80-_0    Electrical Stimulation Goals Pain     Manual Therapy   Manual Therapy --   Myofascial Release --   Passive ROM --                  PT Short Term Goals - 03/08/16 1140      PT SHORT TERM GOAL #1   Title I with inital HEP   Time 2   Period Weeks   Status Achieved     PT SHORT TERM GOAL #2   Title decrease pain in R shoulder by 25% with ADLS.   Time  2   Period Weeks   Status Achieved  03/08/16            PT Long Term Goals - 07/09/16 0908      PT LONG TERM GOAL #1   Title Able to perform ADLs including work requirements with R shoulder pain 2/10 or less.  pain was 4-5/10 today   Time 4   Period Weeks   Status Partially Met     PT LONG TERM GOAL #2   Title Demo active R shoulder flex to 165 deg to improve function.  155 degrees   Time 4   Period Weeks   Status Not Met     PT LONG TERM GOAL #3   Title Demo 5/5 R shoulder strength  4- to 4/5   Time 4   Period Weeks   Status Not Met               Plan - 07/09/16 0901    Clinical Impression Statement Pt did fair today, but had increased pain in RT shldr today 5/10. We reviewed HEP for RT shldr strengthening and performed US/ combo to help decrease pain. Her AROM was 155 degrees of flexion and 60 degrees of ER. She has been doing better 70% overall, but has  had more pain the last 2 days than usual.  Her strength in ER was 4/5 and flexion4-/5 due to pain.   Clinical Impairments Affecting Rehab Potential 01/20/16 surgery current 18 weeks 05/25/16   PT Treatment/Interventions ADLs/Self Care Home Management;Electrical Stimulation;Cryotherapy;Moist Heat;Therapeutic exercise;Therapeutic activities;Ultrasound;Neuromuscular re-education;Patient/family education;Manual techniques;Passive range of motion;Vasopneumatic Device   PT Next Visit Plan Manual therapy (deep) to R pecs and lateral scapula; continue strenghtening to mid/lower traps and shoulder External rotators, functional strengthening.      Send  MD note MD visit 08-09-16   PT Home Exercise Plan RW4, PREs    Consulted and Agree with Plan of Care Patient      Patient will benefit from skilled therapeutic intervention in order to improve the following deficits and impairments:  Pain, Decreased range of motion, Decreased activity tolerance, Decreased strength, Impaired UE functional use  Visit Diagnosis: Right shoulder  pain  Stiffness of right shoulder, not elsewhere classified     Problem List Patient Active Problem List   Diagnosis Date Noted  . Diverticulitis large intestine 01/07/2015  . Diverticulitis 07/03/2014  . Hypokalemia 07/03/2014  . Tobacco abuse   . Obesity   . Upper airway cough syndrome 01/28/2014  .  IBS (irritable bowel syndrome) 07/10/2013  . Acute bilateral lower abdominal pain 03/26/2013  . Tiredness 03/26/2013  . Sigmoid diverticulitis 09/12/2012  . Epigastric pain 05/22/2012  . Nausea and vomiting 01/28/2012  . Crohn's disease of ileum ([place]) 01/10/2012  . GERD (gastroesophageal reflux disease) 01/10/2012  . HTN (hypertension) 01/10/2012    RAMSEUR,CHRIS, PTA 07/09/2016, 9:36 AM  [facility] [place], [place], [address] Phone: [phone]   Fax:  [phone]  Name: Elizabeth [name] MRN: [id number] Date of Birth: [birth year]

## 2016-07-09 NOTE — Therapy (Addendum)
[facility] [place], [place], [address] Phone: [phone]   Fax:  [phone]  Physical Therapy Treatment  Patient Details  Name: Elizabeth [name] MRN: [id number] Date of Birth: 1961-10-26 Referring Provider: Meredith Pel MD.  Encounter Date: 07/09/2016      PT End of Session - 07/09/16 0925    Visit Number 43   Number of Visits 51   Date for PT Re-Evaluation 07/21/16   PT Start Time 0815   PT Stop Time 0909   PT Time Calculation (min) 54 min      Past Medical History:  Diagnosis Date  . Bronchitis   . Cough 01/28/2014   upper airway cough syndrome  . Crohn's disease ([place])   . Diverticulitis   . Elevated transaminase level   . GERD (gastroesophageal reflux disease)   . Hypertension   . Obesity   . Pneumonia 1/15  . Tobacco abuse     Past Surgical History:  Procedure Laterality Date  . ABDOMINAL HYSTERECTOMY  1980  . APPENDECTOMY    . COLONOSCOPY    . COLONOSCOPY N/A 08/16/2014   Procedure: COLONOSCOPY;  Surgeon: Rogene Houston, MD;  [facility];  Service: Endoscopy;  Laterality: N/A;  1200-rescheduled to 9/4 @ 10:45 Ann notified pt  . ESOPHAGOGASTRODUODENOSCOPY  06/16/2012   Procedure: ESOPHAGOGASTRODUODENOSCOPY (EGD);  Surgeon: Rogene Houston, MD;  [facility];  Service: Endoscopy;  Laterality: N/A;  10:30 AM  . hemocolectomy  2005   right  . LAPAROSCOPIC SIGMOID COLECTOMY N/A 01/07/2015   Procedure: LOW ANTERIOR COLON RESECTION;  Surgeon: Fanny Skates, MD;  Location: [facility];  Service: General;  Laterality: N/A;  . SHOULDER ARTHROSCOPY WITH LABRAL REPAIR Right 01/20/2016   Procedure: SHOULDER ARTHROSCOPY WITH LABRAL REPAIR;  Surgeon: Meredith Pel, MD;  Location: [facility];  Service: Orthopedics;  Laterality: Right;    There were no vitals filed for this visit.      Subjective Assessment - 07/09/16 0814    Subjective The last 2 nights pain has been 4-5/10. having to take  percocet just to sleep. To MD 08-09-16   Pertinent History HTN, Crohns Disease   Diagnostic tests mri, xray; results: mod RC tendinopathy/tendinosis, shallow tears of supraspinatus (no full thickness tears); ant/inf labral tear; mod GH degenerative changes   Patient Stated Goals Get back to work.   Currently in Pain? Yes   Pain Score 2    Pain Location Shoulder   Pain Orientation Right   Pain Descriptors / Indicators Aching   Pain Type Surgical pain   Pain Onset More than a month ago   Pain Frequency Intermittent   Aggravating Factors  Prolonged use                         [facility] Adult PT Treatment/Exercise - 07/09/16 0001      Elbow Exercises   Elbow Flexion Strengthening;Right  3x15   Bar Weights/Barbell (Elbow Flexion) 5 lbs   Elbow Extension --  3x15 with pink XTS     Shoulder Exercises: Supine   Other Supine Exercises --     Shoulder Exercises: Prone   Retraction Strengthening;Right;Weights  3x15   Retraction Weight (lbs) 5   Extension Strengthening;Right;Weights  3x15   Extension Weight (lbs) 5     Shoulder Exercises: Standing   Row --  3x15 with pink XTS   Other Standing Exercises RW4 exs with red band  Shoulder Exercises: ROM/Strengthening   UBE (Upper Arm Bike) 90 RPM x10 min     Modalities   Modalities Electrical Stimulation;Moist Heat;Ultrasound     Moist Heat Therapy   Number Minutes Moist Heat 15 Minutes   Moist Heat Location Shoulder     Electrical Stimulation   Electrical Stimulation Location R shoulder   Electrical Stimulation Action IFC x 15 mins 80-_0    Electrical Stimulation Goals Pain     Manual Therapy   Manual Therapy --   Myofascial Release --   Passive ROM --                  PT Short Term Goals - 03/08/16 1140      PT SHORT TERM GOAL #1   Title I with inital HEP   Time 2   Period Weeks   Status Achieved     PT SHORT TERM GOAL #2   Title decrease pain in R shoulder by 25% with ADLS.   Time  2   Period Weeks   Status Achieved  03/08/16            PT Long Term Goals - 07/09/16 0908      PT LONG TERM GOAL #1   Title Able to perform ADLs including work requirements with R shoulder pain 2/10 or less.  pain was 4-5/10 today   Time 4   Period Weeks   Status Partially Met     PT LONG TERM GOAL #2   Title Demo active R shoulder flex to 165 deg to improve function.  155 degrees   Time 4   Period Weeks   Status Not Met     PT LONG TERM GOAL #3   Title Demo 5/5 R shoulder strength  4- to 4/5   Time 4   Period Weeks   Status Not Met               Plan - 07/09/16 0901    Clinical Impression Statement Pt did fair today, but had increased pain in RT shldr today 5/10. We reviewed HEP for RT shldr strengthening and performed US/ combo to help decrease pain. Her AROM was 155 degrees of flexion and 60 degrees of ER. She has been doing better 70% overall, but has  had more pain the last 2 days than usual.  Her strength in ER was 4/5 and flexion4-/5 due to pain.   Clinical Impairments Affecting Rehab Potential 01/20/16 surgery current 18 weeks 05/25/16   PT Treatment/Interventions ADLs/Self Care Home Management;Electrical Stimulation;Cryotherapy;Moist Heat;Therapeutic exercise;Therapeutic activities;Ultrasound;Neuromuscular re-education;Patient/family education;Manual techniques;Passive range of motion;Vasopneumatic Device   PT Next Visit Plan Manual therapy (deep) to R pecs and lateral scapula; continue strenghtening to mid/lower traps and shoulder External rotators, functional strengthening.      Send  MD note MD visit 08-09-16   PT Home Exercise Plan RW4, PREs    Consulted and Agree with Plan of Care Patient      Patient will benefit from skilled therapeutic intervention in order to improve the following deficits and impairments:  Pain, Decreased range of motion, Decreased activity tolerance, Decreased strength, Impaired UE functional use  Visit Diagnosis: Right shoulder  pain  Stiffness of right shoulder, not elsewhere classified     Problem List Patient Active Problem List   Diagnosis Date Noted  . Diverticulitis large intestine 01/07/2015  . Diverticulitis 07/03/2014  . Hypokalemia 07/03/2014  . Tobacco abuse   . Obesity   . Upper airway cough syndrome 01/28/2014  .  IBS (irritable bowel syndrome) 07/10/2013  . Acute bilateral lower abdominal pain 03/26/2013  . Tiredness 03/26/2013  . Sigmoid diverticulitis 09/12/2012  . Epigastric pain 05/22/2012  . Nausea and vomiting 01/28/2012  . Crohn's disease of ileum ([place]) 01/10/2012  . GERD (gastroesophageal reflux disease) 01/10/2012  . HTN (hypertension) 01/10/2012    APPLEGATE, Mali MPT 07/09/2016, 12:14 PM  [facility] [address] [place], [place], [address] Phone: [phone]   Fax:  [phone]  Name: Elizabeth [name] MRN: [id number] Date of Birth: Feb 23, 1961  PHYSICAL THERAPY DISCHARGE SUMMARY  Visits from Start of Care: 60.  Current functional level related to goals / functional outcomes: See above. Remaining deficits: Continued right shoulder pain, loss of ROM and strength.   Education / Equipment: HEP Plan: Patient agrees to discharge.  Patient goals were not met. Patient is being discharged due to lack of progress.  ?????         Mali Applegate MPT

## 2016-07-16 ENCOUNTER — Encounter: Payer: Self-pay | Admitting: Physical Therapy

## 2016-07-19 ENCOUNTER — Encounter: Payer: Self-pay | Admitting: Physical Therapy

## 2016-07-23 ENCOUNTER — Encounter: Payer: Self-pay | Admitting: *Deleted

## 2016-07-28 ENCOUNTER — Other Ambulatory Visit (INDEPENDENT_AMBULATORY_CARE_PROVIDER_SITE_OTHER): Payer: Self-pay | Admitting: *Deleted

## 2016-07-28 MED ORDER — PROMETHAZINE HCL 25 MG PO TABS: 25.0000 mg | ORAL_TABLET | Freq: Two times a day (BID) | ORAL | 0 refills | Status: DC

## 2016-07-28 NOTE — Telephone Encounter (Signed)
Patient called and states that she is having bad time with reflux. Currently taking Omeprazole 20 mg po twice a day. She is experiencing nausea. Per Dr.Rehman may change the patient's PPI , we will try the Dexilant as the patient has taken Nexium , Prilosec OTC, Omeprazole and Pantoprazole in the past. Will give the patient samples if we have them , other wise we will send a Rx. For nausea - per Dr.Rehman may send in a request for Phenergan 25 mg - Take 1 by mouth PO BID for Nausea #20 no refills. This has been E-Scribed to patient's pharmacy. Patient is aware.  Samples of Dexilant have been given to the patient. If this helps we will do a Rx to her Pharmacy.

## 2016-08-17 ENCOUNTER — Other Ambulatory Visit (INDEPENDENT_AMBULATORY_CARE_PROVIDER_SITE_OTHER): Payer: Self-pay | Admitting: Pediatrics

## 2016-08-17 MED ORDER — DEXLANSOPRAZOLE 60 MG PO CPDR: 60.0000 mg | DELAYED_RELEASE_CAPSULE | Freq: Every day | ORAL | 3 refills | Status: DC

## 2016-08-17 NOTE — Telephone Encounter (Signed)
Pt states samples given in office worked well for her. She states she would like rx for 90 day supply of Dexilant sent to pharmacy.

## 2016-11-24 ENCOUNTER — Ambulatory Visit (INDEPENDENT_AMBULATORY_CARE_PROVIDER_SITE_OTHER): Payer: Self-pay | Admitting: Internal Medicine

## 2016-11-24 ENCOUNTER — Encounter (INDEPENDENT_AMBULATORY_CARE_PROVIDER_SITE_OTHER): Payer: Self-pay | Admitting: Internal Medicine

## 2016-12-13 HISTORY — PX: TONGUE SURGERY: SHX810

## 2017-04-08 DIAGNOSIS — S39012A Strain of muscle, fascia and tendon of lower back, initial encounter: Secondary | ICD-10-CM | POA: Insufficient documentation

## 2017-04-08 DIAGNOSIS — F5101 Primary insomnia: Secondary | ICD-10-CM | POA: Insufficient documentation

## 2017-04-20 ENCOUNTER — Telehealth (INDEPENDENT_AMBULATORY_CARE_PROVIDER_SITE_OTHER): Payer: Self-pay | Admitting: *Deleted

## 2017-04-20 NOTE — Telephone Encounter (Signed)
Patient called and let a message. One of the things she ask about is a office appointment. Patient has a new job and insurance will be 3 months before going into effect . Patient also ask for samples of Dexilant. If we have them , we give her a call to come and pick them up.

## 2017-05-24 ENCOUNTER — Encounter (INDEPENDENT_AMBULATORY_CARE_PROVIDER_SITE_OTHER): Payer: Self-pay | Admitting: Internal Medicine

## 2017-05-24 ENCOUNTER — Encounter (INDEPENDENT_AMBULATORY_CARE_PROVIDER_SITE_OTHER): Payer: Self-pay | Admitting: *Deleted

## 2017-05-24 ENCOUNTER — Ambulatory Visit (INDEPENDENT_AMBULATORY_CARE_PROVIDER_SITE_OTHER): Payer: Self-pay | Admitting: Internal Medicine

## 2017-05-24 VITALS — BP 122/84 | HR 60 | Temp 98.2°F | Ht 67.0 in | Wt 209.0 lb

## 2017-05-24 DIAGNOSIS — K219 Gastro-esophageal reflux disease without esophagitis: Secondary | ICD-10-CM

## 2017-05-24 NOTE — Patient Instructions (Addendum)
DG esophagram. Zantac 180m at night.

## 2017-05-24 NOTE — Progress Notes (Signed)
Subjective:    Patient ID: Elizabeth [name], female    DOB: Feb 08, 1961, 56 y.o.   MRN: [id number]  HPI Here today with c/o nausea. She tells me today she has  Nausea.  She says she has epigastric pain. Symptoms for 2 weeks. She has more gas than usual.  Nausea is daily. She has a constant pain in her chest. She says the Dexilant is not working some She is propping up on 3 pillows. 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  Hx of small bowel Crohn's diagnosed in 2004 Her appetite is okay.  Weight in June 2017 was 215. Today her weight is 209.  She denies dysphagia.   She has a BM 8-10 times a day.  No NSAIDs   Last colonoscopy was in September 2015 revealing focal erythema at ileocolonic anastomosis.  01/07/2015:  sigmoid colon resection for recurrent sigmoid diverticulitis. She had ongoing diverticulitis on path and no changes of Crohn's disease and resected segment  Review of Systems Past Medical History:  Diagnosis Date  . Bronchitis   . Cough 01/28/2014   upper airway cough syndrome  . Crohn's disease ([place])   . Diverticulitis   . Elevated transaminase level   . GERD (gastroesophageal reflux disease)   . Hypertension   . Obesity   . Pneumonia 1/15  . Tobacco abuse     Past Surgical History:  Procedure Laterality Date  . ABDOMINAL HYSTERECTOMY  1980  . APPENDECTOMY    . COLONOSCOPY    . COLONOSCOPY N/A 08/16/2014   Procedure: COLONOSCOPY;  Surgeon: Rogene Houston, MD;  [facility];  Service: Endoscopy;  Laterality: N/A;  1200-rescheduled to 9/4 @ 10:45 Ann notified pt  . ESOPHAGOGASTRODUODENOSCOPY  06/16/2012   Procedure: ESOPHAGOGASTRODUODENOSCOPY (EGD);  Surgeon: Rogene Houston, MD;  [facility];  Service: Endoscopy;  Laterality: N/A;  10:30 AM  . hemocolectomy  2005   right  . LAPAROSCOPIC SIGMOID COLECTOMY N/A 01/07/2015   Procedure: LOW ANTERIOR COLON RESECTION;  Surgeon: Fanny Skates, MD;  Location:  [facility];  Service: General;  Laterality: N/A;  . SHOULDER ARTHROSCOPY WITH LABRAL REPAIR Right 01/20/2016   Procedure: SHOULDER ARTHROSCOPY WITH LABRAL REPAIR;  Surgeon: Meredith Pel, MD;  Location: [facility];  Service: Orthopedics;  Laterality: Right;    Allergies  Allergen Reactions  . Contrast Media [Iodinated Diagnostic Agents] Hives and Rash  . Penicillins Hives and Rash             Current Outpatient Prescriptions on File Prior to Visit  Medication Sig Dispense Refill  . dexlansoprazole (DEXILANT) 60 MG capsule Take 1 capsule (60 mg total) by mouth daily. 90 capsule 3  . dicyclomine (BENTYL) 10 MG capsule Take 1 capsule (10 mg total) by mouth 3 (three) times daily before meals. 90 capsule 4  . mesalamine (PENTASA) 500 MG CR capsule Take 1,000 mg by mouth 4 (four) times daily.     Marland Kitchen omeprazole (PRILOSEC) 20 MG capsule Take 2 capsules (40 mg total) by mouth daily. 30 capsule 5  . potassium chloride (MICRO-K) 10 MEQ CR capsule Take 10 mEq by mouth daily.     . promethazine (PHENERGAN) 25 MG tablet Take 1 tablet (25 mg total) by mouth 2 (two) times daily. for Nausea 20 tablet 0  . valsartan-hydrochlorothiazide (DIOVAN-HCT) 160-12.5 MG per tablet Take 0.5 tablets by mouth daily.      No current facility-administered medications on  file prior to visit.         Objective:   Physical Exam Blood pressure 122/84, pulse 60, temperature 98.2 F (36.8 C), height 5' 7"  (1.702 m), weight 209 lb (94.8 kg).  Alert and oriented. Skin warm and dry. Oral mucosa is moist.   . Sclera anicteric, conjunctivae is pink. Thyroid not enlarged. No cervical lymphadenopathy. Lungs clear. Heart regular rate and rhythm.  Abdomen is soft. Bowel sounds are positive. No hepatomegaly. No abdominal masses felt. No tenderness.  No edema to lower extremities.         Assessment & Plan:  DG esphgram Continue the Dexilant. Take Zantac OTC

## 2017-05-27 ENCOUNTER — Ambulatory Visit (HOSPITAL_COMMUNITY)
Admission: RE | Admit: 2017-05-27 | Discharge: 2017-05-27 | Disposition: A | Discharge status: Home / Self Care | Payer: Self-pay | Source: Ambulatory Visit | Attending: Internal Medicine | Admitting: Internal Medicine

## 2017-05-27 DIAGNOSIS — K219 Gastro-esophageal reflux disease without esophagitis: Secondary | ICD-10-CM | POA: Insufficient documentation

## 2017-08-24 ENCOUNTER — Other Ambulatory Visit (INDEPENDENT_AMBULATORY_CARE_PROVIDER_SITE_OTHER): Payer: Self-pay | Admitting: *Deleted

## 2017-08-24 MED ORDER — OMEPRAZOLE 40 MG PO CPDR: 40.0000 mg | DELAYED_RELEASE_CAPSULE | Freq: Every day | ORAL | 3 refills | Status: DC

## 2017-08-24 NOTE — Telephone Encounter (Signed)
Patient left a message on 08/23/2017 asking for a prescription to be called into the [facility] in [place]. She has been taking Dexilant but her Insurance will not cover this medication. Per Deberah Castle, Np the patient may try this.  The RX was E-scribed to patient's pharmacy and the patient was left a message on her voicemail to check with her Pharmacy later today.

## 2017-09-06 ENCOUNTER — Ambulatory Visit (INDEPENDENT_AMBULATORY_CARE_PROVIDER_SITE_OTHER): Payer: Self-pay | Admitting: Internal Medicine

## 2017-11-01 ENCOUNTER — Ambulatory Visit (INDEPENDENT_AMBULATORY_CARE_PROVIDER_SITE_OTHER): Payer: Self-pay | Admitting: Internal Medicine

## 2017-11-01 ENCOUNTER — Encounter (INDEPENDENT_AMBULATORY_CARE_PROVIDER_SITE_OTHER): Payer: Self-pay | Admitting: Internal Medicine

## 2017-11-01 VITALS — BP 106/80 | HR 64 | Temp 97.9°F | Ht 67.0 in | Wt 204.1 lb

## 2017-11-01 DIAGNOSIS — K5090 Crohn's disease, unspecified, without complications: Secondary | ICD-10-CM

## 2017-11-01 NOTE — Patient Instructions (Signed)
CBC and sedrate in January.

## 2017-11-01 NOTE — Progress Notes (Signed)
Subjective:    Patient ID: Elizabeth [name], female    DOB: June 22, 1961, 56 y.o.   MRN: [id number]  HPI Here today for f/u. Last seen in June of this year with nausea.  Hx of Crohn's disease. She tells me she is doing good. She has been drinking pop and lemonade. Her appetite is okay.  No abdominal pain. She is having BMs x 9 a day.; Takes Imodium daily. She is working [employer] in [place].  She will not have insurance till January.  GERD controlled with Prilosec. Last colonoscopy was in September 2015 revealing focal erythema at ileocolonic anastomosis. 01/07/2015: sigmoid colon resection for recurrent sigmoid diverticulitis.She had ongoing diverticulitis on path and no changes of Crohn's disease and resected segment  Review of Systems Past Medical History:  Diagnosis Date  . Bronchitis   . Cough 01/28/2014   upper airway cough syndrome  . Crohn's disease ([place])   . Diverticulitis   . Elevated transaminase level   . GERD (gastroesophageal reflux disease)   . Hypertension   . Obesity   . Pneumonia 1/15  . Tobacco abuse     Past Surgical History:  Procedure Laterality Date  . ABDOMINAL HYSTERECTOMY  1980  . APPENDECTOMY    . COLONOSCOPY    . COLONOSCOPY N/A 08/16/2014   Performed by Rogene Houston, MD at [facility]  . ESOPHAGOGASTRODUODENOSCOPY (EGD) N/A 06/16/2012   Performed by Rogene Houston, MD at [facility]  . hemocolectomy  2005   right  . LOW ANTERIOR COLON RESECTION N/A 01/07/2015   Performed by Fanny Skates, MD at [place] [facility] [place] ARTHROSCOPY WITH LABRAL REPAIR Right 01/20/2016   Performed by Meredith Pel, MD at [facility] OR    Allergies  Allergen Reactions  . Contrast Media [Iodinated Diagnostic Agents] Hives and Rash  . Penicillins Hives and Rash             Current Outpatient Medications on File Prior to Visit  Medication Sig Dispense Refill  . loperamide (IMODIUM) 2 MG capsule Take by mouth as needed for diarrhea or loose stools.    . mesalamine  (PENTASA) 500 MG CR capsule Take 1,000 mg by mouth 4 (four) times daily.     . potassium chloride (MICRO-K) 10 MEQ CR capsule Take 10 mEq by mouth 2 (two) times daily.     . promethazine (PHENERGAN) 25 MG tablet Take 1 tablet (25 mg total) by mouth 2 (two) times daily. for Nausea (Patient taking differently: Take 25 mg by mouth as needed. for Nausea) 20 tablet 0  . valsartan-hydrochlorothiazide (DIOVAN-HCT) 160-12.5 MG per tablet Take 0.5 tablets by mouth daily.      No current facility-administered medications on file prior to visit.         Objective:   Physical Exam Blood pressure 106/80, pulse 64, temperature 97.9 F (36.6 C), height 5' 7"  (1.702 m), weight 204 lb 1.6 oz (92.6 kg). Alert and oriented. Skin warm and dry. Oral mucosa is moist.   . Sclera anicteric, conjunctivae is pink. Thyroid not enlarged. No cervical lymphadenopathy. Lungs clear. Heart regular rate and rhythm.  Abdomen is soft. Bowel sounds are positive. No hepatomegaly. No abdominal masses felt. No tenderness.  No edema to lower extremities.           Assessment & Plan:  Crohn's disease. She seems to be doing well.  Needs a CBC and sed rate, but will wait till January for this due to  her insurance.

## 2017-11-02 ENCOUNTER — Other Ambulatory Visit (INDEPENDENT_AMBULATORY_CARE_PROVIDER_SITE_OTHER): Payer: Self-pay | Admitting: *Deleted

## 2017-11-02 DIAGNOSIS — K219 Gastro-esophageal reflux disease without esophagitis: Secondary | ICD-10-CM

## 2017-11-02 DIAGNOSIS — K5010 Crohn's disease of large intestine without complications: Secondary | ICD-10-CM

## 2017-11-29 DIAGNOSIS — J209 Acute bronchitis, unspecified: Secondary | ICD-10-CM | POA: Insufficient documentation

## 2017-12-09 ENCOUNTER — Other Ambulatory Visit (INDEPENDENT_AMBULATORY_CARE_PROVIDER_SITE_OTHER): Payer: Self-pay | Admitting: *Deleted

## 2017-12-09 ENCOUNTER — Encounter (INDEPENDENT_AMBULATORY_CARE_PROVIDER_SITE_OTHER): Payer: Self-pay | Admitting: *Deleted

## 2017-12-09 DIAGNOSIS — K5010 Crohn's disease of large intestine without complications: Secondary | ICD-10-CM

## 2017-12-09 DIAGNOSIS — K219 Gastro-esophageal reflux disease without esophagitis: Secondary | ICD-10-CM

## 2018-01-10 ENCOUNTER — Other Ambulatory Visit (INDEPENDENT_AMBULATORY_CARE_PROVIDER_SITE_OTHER): Payer: Self-pay | Admitting: *Deleted

## 2018-01-10 MED ORDER — DICYCLOMINE HCL 10 MG PO CAPS: 10.0000 mg | ORAL_CAPSULE | Freq: Three times a day (TID) | ORAL | 1 refills | Status: DC

## 2018-01-12 ENCOUNTER — Encounter (INDEPENDENT_AMBULATORY_CARE_PROVIDER_SITE_OTHER): Payer: Self-pay | Admitting: *Deleted

## 2018-01-12 ENCOUNTER — Other Ambulatory Visit (INDEPENDENT_AMBULATORY_CARE_PROVIDER_SITE_OTHER): Payer: Self-pay | Admitting: *Deleted

## 2018-01-12 ENCOUNTER — Encounter (INDEPENDENT_AMBULATORY_CARE_PROVIDER_SITE_OTHER): Payer: Self-pay | Admitting: Internal Medicine

## 2018-01-12 ENCOUNTER — Other Ambulatory Visit (INDEPENDENT_AMBULATORY_CARE_PROVIDER_SITE_OTHER): Payer: Self-pay | Admitting: Internal Medicine

## 2018-01-12 ENCOUNTER — Ambulatory Visit (INDEPENDENT_AMBULATORY_CARE_PROVIDER_SITE_OTHER): Payer: Managed Care, Other (non HMO) | Admitting: Internal Medicine

## 2018-01-12 VITALS — BP 124/82 | HR 60 | Temp 97.8°F | Ht 67.0 in | Wt 196.8 lb

## 2018-01-12 DIAGNOSIS — K5010 Crohn's disease of large intestine without complications: Secondary | ICD-10-CM | POA: Diagnosis not present

## 2018-01-12 DIAGNOSIS — K5000 Crohn's disease of small intestine without complications: Secondary | ICD-10-CM | POA: Diagnosis not present

## 2018-01-12 LAB — CBC WITH DIFFERENTIAL/PLATELET
BASOS ABS: 50 {cells}/uL (ref 0–200)
Basophils Relative: 0.5 %
EOS ABS: 180 {cells}/uL (ref 15–500)
Eosinophils Relative: 1.8 %
HCT: 41.6 % (ref 35.0–45.0)
Hemoglobin: 15 g/dL (ref 11.7–15.5)
Lymphs Abs: 2040 cells/uL (ref 850–3900)
MCH: 35.1 pg — ABNORMAL HIGH (ref 27.0–33.0)
MCHC: 36.1 g/dL — AB (ref 32.0–36.0)
MCV: 97.4 fL (ref 80.0–100.0)
MONOS PCT: 6.6 %
MPV: 10 fL (ref 7.5–12.5)
NEUTROS PCT: 70.7 %
Neutro Abs: 7070 cells/uL (ref 1500–7800)
PLATELETS: 249 10*3/uL (ref 140–400)
RBC: 4.27 10*6/uL (ref 3.80–5.10)
RDW: 12.9 % (ref 11.0–15.0)
TOTAL LYMPHOCYTE: 20.4 %
WBC mixed population: 660 cells/uL (ref 200–950)
WBC: 10 10*3/uL (ref 3.8–10.8)

## 2018-01-12 LAB — SEDIMENTATION RATE: Sed Rate: 6 mm/h (ref 0–30)

## 2018-01-12 MED ORDER — PROMETHAZINE HCL 25 MG PO TABS: 25.0000 mg | ORAL_TABLET | Freq: Three times a day (TID) | ORAL | 0 refills | Status: DC | PRN

## 2018-01-12 NOTE — Progress Notes (Signed)
Subjective:    Patient ID: Elizabeth [name], female    DOB: [birth year], 59 y.o.   MRN: [id number]  HPI Presents today with c/o abdominal pain . Hx of Crohn's disease. Last seen in November of 2018 and was doing well.  She states Imodium never stops the diarrhea and gives her gas.  She says the gases crosses her abdomen. She is having a BM x 15. Today she has had 3 BMs.  She says usually has 10-15 stools a day which is normal for her. She says these are the same symptoms she usually has. Today her main complaint is the gas pain .  GERD controlled with Prilosec. Last colonoscopy was in September 2015 revealing focal erythema at ileocolonic anastomosis. 01/07/2015: sigmoid colon resection for recurrent sigmoid diverticulitis.She had ongoing diverticulitis on path and no changes of Crohn's disease and resected segment  Review of Systems Past Medical History:  Diagnosis Date  . Bronchitis   . Cough 01/28/2014   upper airway cough syndrome  . Crohn's disease ([place])   . Diverticulitis   . Elevated transaminase level   . GERD (gastroesophageal reflux disease)   . Hypertension   . Obesity   . Pneumonia 1/15  . Tobacco abuse     Past Surgical History:  Procedure Laterality Date  . ABDOMINAL HYSTERECTOMY  1980  . APPENDECTOMY    . COLONOSCOPY    . COLONOSCOPY N/A 08/16/2014   Procedure: COLONOSCOPY;  Surgeon: Rogene Houston, MD;  [facility];  Service: Endoscopy;  Laterality: N/A;  1200-rescheduled to 9/4 @ 10:45 Ann notified pt  . ESOPHAGOGASTRODUODENOSCOPY  06/16/2012   Procedure: ESOPHAGOGASTRODUODENOSCOPY (EGD);  Surgeon: Rogene Houston, MD;  [facility];  Service: Endoscopy;  Laterality: N/A;  10:30 AM  . hemocolectomy  2005   right  . LAPAROSCOPIC SIGMOID COLECTOMY N/A 01/07/2015   Procedure: LOW ANTERIOR COLON RESECTION;  Surgeon: Fanny Skates, MD;  Location: [place];  Service: General;  Laterality: N/A;  . SHOULDER ARTHROSCOPY WITH LABRAL REPAIR Right  01/20/2016   Procedure: SHOULDER ARTHROSCOPY WITH LABRAL REPAIR;  Surgeon: Meredith Pel, MD;  Location: [place];  Service: Orthopedics;  Laterality: Right;    Allergies  Allergen Reactions  . Contrast Media [Iodinated Diagnostic Agents] Hives and Rash  . Penicillins Hives and Rash             Current Outpatient Medications on File Prior to Visit  Medication Sig Dispense Refill  . dicyclomine (BENTYL) 10 MG capsule Take 1 capsule (10 mg total) by mouth 3 (three) times daily before meals. Prn abdominal cramping. 90 capsule 1  . loperamide (IMODIUM) 2 MG capsule Take by mouth as needed for diarrhea or loose stools.    . mesalamine (PENTASA) 500 MG CR capsule Take 1,000 mg by mouth 4 (four) times daily.     . potassium chloride (MICRO-K) 10 MEQ CR capsule Take 10 mEq by mouth 2 (two) times daily.     . promethazine (PHENERGAN) 25 MG tablet Take 1 tablet (25 mg total) by mouth 2 (two) times daily. for Nausea (Patient taking differently: Take 25 mg by mouth as needed. for Nausea) 20 tablet 0  . valsartan-hydrochlorothiazide (DIOVAN-HCT) 160-12.5 MG per tablet Take 0.5 tablets by mouth daily.      No current facility-administered medications on file prior to visit.         Objective:   Physical Exam Blood pressure 124/82, pulse 60, temperature 97.8 F (36.6 C), height 5'  7" (1.702 m), weight 196 lb 12.8 oz (89.3 kg). Alert and oriented. Skin warm and dry. Oral mucosa is moist.   . Sclera anicteric, conjunctivae is pink. Thyroid not enlarged. No cervical lymphadenopathy. Lungs clear. Heart regular rate and rhythm.  Abdomen is soft. Bowel sounds are positive. No hepatomegaly. No abdominal masses felt. No tenderness.  No edema to lower extremities          Assessment & Plan:  [place], Hx of Crohn's disease.  Will increase Dicyclomine to 70m QID. CBC and sedrate.

## 2018-01-12 NOTE — Patient Instructions (Signed)
Dicyclomine to 63m QID. Labs today.

## 2018-03-31 ENCOUNTER — Other Ambulatory Visit (HOSPITAL_COMMUNITY): Payer: Self-pay | Admitting: General Surgery

## 2018-03-31 DIAGNOSIS — K430 Incisional hernia with obstruction, without gangrene: Secondary | ICD-10-CM

## 2018-04-19 ENCOUNTER — Ambulatory Visit (HOSPITAL_COMMUNITY): Payer: Managed Care, Other (non HMO)

## 2018-06-08 ENCOUNTER — Ambulatory Visit (HOSPITAL_COMMUNITY)
Admission: RE | Admit: 2018-06-08 | Discharge: 2018-06-08 | Disposition: A | Discharge status: Home / Self Care | Payer: Managed Care, Other (non HMO) | Source: Ambulatory Visit | Attending: General Surgery | Admitting: General Surgery

## 2018-06-08 DIAGNOSIS — K529 Noninfective gastroenteritis and colitis, unspecified: Secondary | ICD-10-CM | POA: Diagnosis not present

## 2018-06-08 DIAGNOSIS — I700 Atherosclerosis of aorta: Secondary | ICD-10-CM | POA: Insufficient documentation

## 2018-06-08 DIAGNOSIS — Z90710 Acquired absence of both cervix and uterus: Secondary | ICD-10-CM | POA: Insufficient documentation

## 2018-06-08 DIAGNOSIS — K430 Incisional hernia with obstruction, without gangrene: Secondary | ICD-10-CM | POA: Insufficient documentation

## 2018-06-08 DIAGNOSIS — Z9889 Other specified postprocedural states: Secondary | ICD-10-CM | POA: Diagnosis not present

## 2018-06-08 DIAGNOSIS — K429 Umbilical hernia without obstruction or gangrene: Secondary | ICD-10-CM | POA: Insufficient documentation

## 2018-06-08 MED ORDER — IOPAMIDOL (ISOVUE-300) INJECTION 61%
100.0000 mL | Freq: Once | INTRAVENOUS | Status: AC | PRN
  Administered 2018-06-08: 100 mL via INTRAVENOUS

## 2018-07-13 ENCOUNTER — Other Ambulatory Visit (INDEPENDENT_AMBULATORY_CARE_PROVIDER_SITE_OTHER): Payer: Self-pay | Admitting: *Deleted

## 2018-07-13 DIAGNOSIS — K50018 Crohn's disease of small intestine with other complication: Secondary | ICD-10-CM

## 2018-07-13 DIAGNOSIS — R110 Nausea: Secondary | ICD-10-CM

## 2018-07-13 MED ORDER — PROMETHAZINE HCL 25 MG PO TABS: 25.0000 mg | ORAL_TABLET | Freq: Three times a day (TID) | ORAL | 0 refills | Status: DC | PRN

## 2018-07-13 NOTE — Telephone Encounter (Signed)
Per Raeanne Gathers may give patient RX for Phenergan 25 mg - 1 po tid prn nausea. #20 no refills.

## 2018-09-09 ENCOUNTER — Other Ambulatory Visit (INDEPENDENT_AMBULATORY_CARE_PROVIDER_SITE_OTHER): Payer: Self-pay | Admitting: Internal Medicine

## 2018-11-01 ENCOUNTER — Ambulatory Visit (INDEPENDENT_AMBULATORY_CARE_PROVIDER_SITE_OTHER): Payer: Self-pay | Admitting: Internal Medicine

## 2018-11-21 ENCOUNTER — Ambulatory Visit (INDEPENDENT_AMBULATORY_CARE_PROVIDER_SITE_OTHER): Payer: Self-pay | Admitting: Internal Medicine

## 2019-02-13 ENCOUNTER — Encounter (INDEPENDENT_AMBULATORY_CARE_PROVIDER_SITE_OTHER): Payer: Self-pay | Admitting: Internal Medicine

## 2019-02-13 ENCOUNTER — Ambulatory Visit (INDEPENDENT_AMBULATORY_CARE_PROVIDER_SITE_OTHER): Payer: 59 | Admitting: Internal Medicine

## 2019-02-13 VITALS — BP 118/81 | HR 69 | Temp 98.4°F | Resp 18 | Ht 67.0 in | Wt 199.4 lb

## 2019-02-13 DIAGNOSIS — K5000 Crohn's disease of small intestine without complications: Secondary | ICD-10-CM | POA: Diagnosis not present

## 2019-02-13 DIAGNOSIS — R197 Diarrhea, unspecified: Secondary | ICD-10-CM

## 2019-02-13 MED ORDER — CHOLESTYRAMINE 4 G PO PACK: 4.0000 g | PACK | Freq: Two times a day (BID) | ORAL | 5 refills | Status: DC

## 2019-02-13 MED ORDER — DICYCLOMINE HCL 10 MG PO CAPS: 10.0000 mg | ORAL_CAPSULE | Freq: Three times a day (TID) | ORAL | 5 refills | Status: DC

## 2019-02-13 NOTE — Progress Notes (Signed)
Presenting complaint;  Follow-up for Crohn's disease.  Subjective:  Patient is 58 year old Caucasian female who has Crohn's disease of small bowel and history of sigmoid diverticulitis who is here for scheduled visit.  She had right hemicolectomy for stenosing Crohn's disease and then she had second surgery with excision of strictured segment.  She had sigmoid colon resection in January 2016 for recurrent sigmoid diverticulitis.  She was last seen in the office in January 2019.  She states she is doing well.  She is having 10 stools per day.  All of her stools are loose.  She generally has 5 stools every morning before she leaves for work and then she has stool after every bowel movement and maybe 1 or 2 in the evening.  She never has to get up in the middle of night to have a bowel movement.  She is able to sleep from 7:30 PM to 3 AM.  She denies abdominal pain melena or rectal bleeding.  She was taking Imodium but it would cause abdominal pain and she therefore stopped it.  She does not eat much meat or beef.  She also stays away from certain solids because she gets diarrhea.  She does not remember the last time she took Phenergan for nausea. 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 She was going to have surgery for ventral hernia last year but then her husband was diagnosed prostate carcinoma and she decided to hold off surgery.  She is using abdominal support while at work.  Current Medications: Outpatient Encounter Medications as of 02/13/2019  Medication Sig  . mesalamine (PENTASA) 500 MG CR capsule Take 1,000 mg by mouth 4 (four) times daily.   Marland Kitchen omeprazole (PRILOSEC) 40 MG capsule TAKE 1 CAPSULE BY MOUTH ONCE DAILY  . potassium chloride (MICRO-K) 10 MEQ CR capsule Take 10 mEq by mouth 2 (two) times daily.   . promethazine (PHENERGAN) 25 MG tablet Take 1 tablet (25 mg total) by mouth 3 (three) times daily as needed for nausea or vomiting.  .  valsartan-hydrochlorothiazide (DIOVAN-HCT) 160-12.5 MG per tablet Take 1 tablet by mouth daily.   Marland Kitchen dicyclomine (BENTYL) 10 MG capsule Take 1 capsule (10 mg total) by mouth 3 (three) times daily before meals. Prn abdominal cramping. (Patient not taking: Reported on 02/13/2019)  . loperamide (IMODIUM) 2 MG capsule Take by mouth as needed for diarrhea or loose stools.  . [DISCONTINUED] promethazine (PHENERGAN) 25 MG tablet Take 1 tablet (25 mg total) by mouth 2 (two) times daily. for Nausea (Patient not taking: Reported on 02/13/2019)   No facility-administered encounter medications on file as of 02/13/2019.      Objective: Blood pressure 118/81, pulse 69, temperature 98.4 F (36.9 C), temperature source Oral, resp. rate 18, height 5' 7"  (1.702 m), weight 199 lb 6.4 oz (90.4 kg). Patient is alert and in no acute distress. Conjunctiva is pink. Sclera is nonicteric Oropharyngeal mucosa is normal. No neck masses or thyromegaly noted. Cardiac exam with regular rhythm normal S1 and S2. No murmur or gallop noted. Lungs are clear to auscultation. Abdomen is full.  She has lower midline scar which extends to the right side of umbilicus.  She has hernia just above the umbilicus.  Is completely reducible.  On palpation abdomen is soft and nontender with organomegaly or masses. No LE edema or clubbing noted.  Labs/studies Results: Lab data from 01/12/2018 WBC 10.0, H&H 15 and 41.6 and platelet count 249K. Sed rate  was 6.  CT from June 2019 reviewed.  Evidence of prior bowel surgery.  There is wall thickening to short segment of small bowel proximal to ileocolonic anastomosis.  There is no bowel dilation proximal to it.  Assessment:  #1.  Small bowel Crohn's disease.  She has chronic diarrhea which may be due to the fact that she has had right hemicolectomy.  She is stop using loperamide because of side effect.  Cholestyramine should help as with dicyclomine.  #2.  History of colonic adenoma.  Last  colonoscopy was in July 2015.  Plan:  Continue Pentasa at current dose of 1 g p.o. 4 times daily. Patient will go to the lab for CBC, comprehensive chemistry panel and CRP. Dicyclomine 10 mg by mouth 30 minutes before each meal.. If dicyclomine does not slow down stool frequency she will try cholestyramine 4 g once or twice daily.  Patient advised to take this medication 2 hours before or after taking other medications. Patient advised to call office with progress report in 1 month. Office visit in 6 months.

## 2019-02-13 NOTE — Patient Instructions (Signed)
If diarrhea does not improve with dicyclomine can take cholestyramine 4 g once or twice daily.  Take this medication 2 hours before or after taking other medications. Physician will call with results of blood test when completed. Progress report in 1 month regarding frequency of stools/diarrhea.

## 2019-04-19 ENCOUNTER — Encounter: Payer: Self-pay | Admitting: Orthopaedic Surgery

## 2019-04-19 ENCOUNTER — Ambulatory Visit (INDEPENDENT_AMBULATORY_CARE_PROVIDER_SITE_OTHER): Payer: 59

## 2019-04-19 ENCOUNTER — Other Ambulatory Visit: Payer: Self-pay

## 2019-04-19 ENCOUNTER — Ambulatory Visit (INDEPENDENT_AMBULATORY_CARE_PROVIDER_SITE_OTHER): Payer: 59 | Admitting: Orthopaedic Surgery

## 2019-04-19 VITALS — BP 129/79 | HR 70 | Temp 97.5°F | Ht 67.0 in | Wt 199.0 lb

## 2019-04-19 DIAGNOSIS — M25551 Pain in right hip: Secondary | ICD-10-CM | POA: Diagnosis not present

## 2019-04-19 DIAGNOSIS — M25552 Pain in left hip: Secondary | ICD-10-CM

## 2019-04-19 DIAGNOSIS — M7061 Trochanteric bursitis, right hip: Secondary | ICD-10-CM

## 2019-04-19 DIAGNOSIS — M7062 Trochanteric bursitis, left hip: Secondary | ICD-10-CM | POA: Diagnosis not present

## 2019-04-19 DIAGNOSIS — F17210 Nicotine dependence, cigarettes, uncomplicated: Secondary | ICD-10-CM | POA: Diagnosis not present

## 2019-04-19 DIAGNOSIS — K5000 Crohn's disease of small intestine without complications: Secondary | ICD-10-CM

## 2019-04-19 DIAGNOSIS — K5090 Crohn's disease, unspecified, without complications: Secondary | ICD-10-CM | POA: Insufficient documentation

## 2019-04-19 NOTE — Patient Instructions (Signed)
Steps to Quit Smoking    Smoking tobacco can be bad for your health. It can also affect almost every organ in your body. Smoking puts you and people around you at risk for many serious long-lasting (chronic) diseases. Quitting smoking is hard, but it is one of the best things that you can do for your health. It is never too late to quit.  What are the benefits of quitting smoking?  When you quit smoking, you lower your risk for getting serious diseases and conditions. They can include:  · Lung cancer or lung disease.  · Heart disease.  · Stroke.  · Heart attack.  · Not being able to have children (infertility).  · Weak bones (osteoporosis) and broken bones (fractures).  If you have coughing, wheezing, and shortness of breath, those symptoms may get better when you quit. You may also get sick less often. If you are pregnant, quitting smoking can help to lower your chances of having a baby of low birth weight.  What can I do to help me quit smoking?  Talk with your doctor about what can help you quit smoking. Some things you can do (strategies) include:  · Quitting smoking totally, instead of slowly cutting back how much you smoke over a period of time.  · Going to in-person counseling. You are more likely to quit if you go to many counseling sessions.  · Using resources and support systems, such as:  ? Online chats with a counselor.  ? Phone quitlines.  ? Printed self-help materials.  ? Support groups or group counseling.  ? Text messaging programs.  ? Mobile phone apps or applications.  · Taking medicines. Some of these medicines may have nicotine in them. If you are pregnant or breastfeeding, do not take any medicines to quit smoking unless your doctor says it is okay. Talk with your doctor about counseling or other things that can help you.  Talk with your doctor about using more than one strategy at the same time, such as taking medicines while you are also going to in-person counseling. This can help make  quitting easier.  What things can I do to make it easier to quit?  Quitting smoking might feel very hard at first, but there is a lot that you can do to make it easier. Take these steps:  · Talk to your family and friends. Ask them to support and encourage you.  · Call phone quitlines, reach out to support groups, or work with a counselor.  · Ask people who smoke to not smoke around you.  · Avoid places that make you want (trigger) to smoke, such as:  ? Bars.  ? Parties.  ? Smoke-break areas at work.  · Spend time with people who do not smoke.  · Lower the stress in your life. Stress can make you want to smoke. Try these things to help your stress:  ? Getting regular exercise.  ? Deep-breathing exercises.  ? Yoga.  ? Meditating.  ? Doing a body scan. To do this, close your eyes, focus on one area of your body at a time from head to toe, and notice which parts of your body are tense. Try to relax the muscles in those areas.  · Download or buy apps on your mobile phone or tablet that can help you stick to your quit plan. There are many free apps, such as QuitGuide from the CDC (Centers for Disease Control and Prevention). You can find more   support from smokefree.gov and other websites.  This information is not intended to replace advice given to you by your health care provider. Make sure you discuss any questions you have with your health care provider.  Document Released: 09/25/2009 Document Revised: 07/27/2016 Document Reviewed: 04/15/2015  Elsevier Interactive Patient Education © 2019 [employer]vier Inc[employer]

## 2019-04-19 NOTE — Progress Notes (Signed)
Subjective:    Patient ID: Elizabeth [name], female    DOB: 1961/05/11, 58 y.o.   MRN: [id number]  HPI She has bilateral hip pain more on the right.  She has seen her family doctor and was told she has trochanteric bursitis.  She has used heat, ice, rubs with no help. She cannot take NSAIDs as she has Crohn's disease.  She says she cannot sleep well.  She has no redness, no numbness, no trauma.  The pain is localized over the trochanteric bursa areas. She is a smoker.   Review of Systems  Constitutional: Positive for activity change.  Musculoskeletal: Positive for arthralgias.  All other systems reviewed and are negative.  For Review of Systems, all other systems reviewed and are negative.  The following is a summary of the past history medically, past history surgically, known current medicines, social history and family history.  This information is gathered electronically by the computer from prior information and documentation.  I review this each visit and have found including this information at this point in the chart is beneficial and informative.   Past Medical History:  Diagnosis Date  . Bronchitis   . Cough 01/28/2014   upper airway cough syndrome  . Crohn's disease ([place])   . Diverticulitis   . Elevated transaminase level   . GERD (gastroesophageal reflux disease)   . Hypertension   . Obesity   . Pneumonia 1/15  . Tobacco abuse     Past Surgical History:  Procedure Laterality Date  . ABDOMINAL HYSTERECTOMY  1980  . APPENDECTOMY    . COLONOSCOPY    . COLONOSCOPY N/A 08/16/2014   Procedure: COLONOSCOPY;  Surgeon: Rogene Houston, MD;  [facility];  Service: Endoscopy;  Laterality: N/A;  1200-rescheduled to 9/4 @ 10:45 Ann notified pt  . ESOPHAGOGASTRODUODENOSCOPY  06/16/2012   Procedure: ESOPHAGOGASTRODUODENOSCOPY (EGD);  Surgeon: Rogene Houston, MD;  [facility];  Service: Endoscopy;  Laterality: N/A;  10:30 AM  . hemocolectomy  2005   right  .  LAPAROSCOPIC SIGMOID COLECTOMY N/A 01/07/2015   Procedure: LOW ANTERIOR COLON RESECTION;  Surgeon: Fanny Skates, MD;  Location: [place];  Service: General;  Laterality: N/A;  . SHOULDER ARTHROSCOPY WITH LABRAL REPAIR Right 01/20/2016   Procedure: SHOULDER ARTHROSCOPY WITH LABRAL REPAIR;  Surgeon: Meredith Pel, MD;  Location: [place];  Service: Orthopedics;  Laterality: Right;    Current Outpatient Medications on File Prior to Visit  Medication Sig Dispense Refill  . cholestyramine (QUESTRAN) 4 g packet Take 1 packet (4 g total) by mouth 2 (two) times daily. 60 each 5  . dicyclomine (BENTYL) 10 MG capsule Take 1 capsule (10 mg total) by mouth 3 (three) times daily before meals. Prn abdominal cramping. 90 capsule 5  . mesalamine (PENTASA) 500 MG CR capsule Take 1,000 mg by mouth 4 (four) times daily.     Marland Kitchen omeprazole (PRILOSEC) 40 MG capsule TAKE 1 CAPSULE BY MOUTH ONCE DAILY 30 capsule 11  . potassium chloride (MICRO-K) 10 MEQ CR capsule Take 10 mEq by mouth 2 (two) times daily.     . potassium chloride (MICRO-K) 10 MEQ CR capsule     . promethazine (PHENERGAN) 25 MG tablet Take 1 tablet (25 mg total) by mouth 3 (three) times daily as needed for nausea or vomiting. 20 tablet 0  . valsartan-hydrochlorothiazide (DIOVAN-HCT) 160-12.5 MG per tablet Take 1 tablet by mouth daily.      No current facility-administered medications  on file prior to visit.     Social History   Socioeconomic History  . Marital status: Married    Spouse name: Not on file  . Number of children: Not on file  . Years of education: Not on file  . Highest education level: Not on file  Occupational History  . Occupation: Wound Treatment Nurse    Employer: [employer]  Social Needs  . Financial resource strain: Not on file  . Food insecurity:    Worry: Not on file    Inability: Not on file  . Transportation needs:    Medical: Not on file    Non-medical: Not on file  Tobacco Use  . Smoking status: Current Every  Day Smoker    Packs/day: 0.50    Years: 40.00    Pack years: 20.00    Types: Cigarettes  . Smokeless tobacco: Never Used  . Tobacco comment: increase smoking , very nervous  Substance and Sexual Activity  . Alcohol use: Yes    Alcohol/week: 0.0 standard drinks    Comment: occ  . Drug use: No  . Sexual activity: Yes  Lifestyle  . Physical activity:    Days per week: Not on file    Minutes per session: Not on file  . Stress: Not on file  Relationships  . Social connections:    Talks on phone: Not on file    Gets together: Not on file    Attends religious service: Not on file    Active member of club or organization: Not on file    Attends meetings of clubs or organizations: Not on file    Relationship status: Not on file  . Intimate partner violence:    Fear of current or ex partner: Not on file    Emotionally abused: Not on file    Physically abused: Not on file    Forced sexual activity: Not on file  Other Topics Concern  . Not on file  Social History Narrative  . Not on file    Family History  Problem Relation Age of Onset  . Healthy Mother   . Lung cancer Father        smoked  . Allergic Disorder Brother   . Healthy Daughter     BP 129/79   Pulse 70   Temp (!) 97.5 F (36.4 C)   Ht 5' 7"  (1.702 m)   Wt 199 lb (90.3 kg)   BMI 31.17 kg/m   Body mass index is 31.17 kg/m.     Objective:   Physical Exam Vitals signs reviewed.  Constitutional:      Appearance: She is well-developed.  HENT:     Head: Normocephalic and atraumatic.  Eyes:     Conjunctiva/sclera: Conjunctivae normal.     Pupils: Pupils are equal, round, and reactive to light.  Neck:     Musculoskeletal: Normal range of motion and neck supple.  Cardiovascular:     Rate and Rhythm: Normal rate and regular rhythm.  Pulmonary:     Effort: Pulmonary effort is normal.  Abdominal:     Palpations: Abdomen is soft.  Musculoskeletal:       Legs:  Skin:    General: Skin is warm and dry.   Neurological:     Mental Status: She is alert and oriented to person, place, and time.     Cranial Nerves: No cranial nerve deficit.     Motor: No abnormal muscle tone.     Coordination: Coordination  normal.     Deep Tendon Reflexes: Reflexes are normal and symmetric. Reflexes normal.  Psychiatric:        Behavior: Behavior normal.        Thought Content: Thought content normal.        Judgment: Judgment normal.   x-rays were done of the pelvis and both hips, reported separately.        Assessment & Plan:   Encounter Diagnoses  Name Primary?  . Left hip pain Yes  . Right hip pain   . Trochanteric bursitis of both hips   . Cigarette nicotine dependence without complication   . Crohn's disease of small intestine without complication ([place])    PROCEDURE NOTE:  The patient request injection, verbal consent was obtained.  The right trochanteric area of the hip was prepped appropriately after time out was performed.   Sterile technique was observed and injection of 1 cc of Depo-Medrol 40 mg with several cc's of plain xylocaine. Anesthesia was provided by ethyl chloride and a 20-gauge needle was used to inject the hip area. The injection was tolerated well.  A band aid dressing was applied.  The patient was advised to apply ice later today and tomorrow to the injection sight as needed.  PROCEDURE NOTE:  The patient request injection, verbal consent was obtained.  The left trochanteric area of the hip was prepped appropriately after time out was performed.   Sterile technique was observed and injection of 1 cc of Depo-Medrol 40 mg with several cc's of plain xylocaine. Anesthesia was provided by ethyl chloride and a 20-gauge needle was used to inject the hip area. The injection was tolerated well.  A band aid dressing was applied.  The patient was advised to apply ice later today and tomorrow to the injection sight as needed.  I will see her in two weeks.  Call if any  problem.  Precautions discussed.   Electronically Signed Sanjuana Kava, MD 5/7/202010:42 AM

## 2019-05-03 ENCOUNTER — Other Ambulatory Visit: Payer: Self-pay

## 2019-05-03 ENCOUNTER — Encounter: Payer: Self-pay | Admitting: Orthopaedic Surgery

## 2019-05-03 ENCOUNTER — Ambulatory Visit (INDEPENDENT_AMBULATORY_CARE_PROVIDER_SITE_OTHER): Payer: 59 | Admitting: Orthopaedic Surgery

## 2019-05-03 VITALS — Temp 97.6°F

## 2019-05-03 DIAGNOSIS — M7061 Trochanteric bursitis, right hip: Secondary | ICD-10-CM | POA: Diagnosis not present

## 2019-05-03 DIAGNOSIS — K5000 Crohn's disease of small intestine without complications: Secondary | ICD-10-CM

## 2019-05-03 DIAGNOSIS — F17210 Nicotine dependence, cigarettes, uncomplicated: Secondary | ICD-10-CM

## 2019-05-03 DIAGNOSIS — M7062 Trochanteric bursitis, left hip: Secondary | ICD-10-CM | POA: Diagnosis not present

## 2019-05-03 NOTE — Progress Notes (Signed)
PROCEDURE NOTE:  The patient request injection, verbal consent was obtained.  The right trochanteric area of the hip was prepped appropriately after time out was performed.   Sterile technique was observed and injection of 1 cc of Depo-Medrol 40 mg with several cc's of plain xylocaine. Anesthesia was provided by ethyl chloride and a 20-gauge needle was used to inject the hip area. The injection was tolerated well.  A band aid dressing was applied.  The patient was advised to apply ice later today and tomorrow to the injection sight as needed.  PROCEDURE NOTE:  The patient request injection, verbal consent was obtained.  The left trochanteric area of the hip was prepped appropriately after time out was performed.   Sterile technique was observed and injection of 1 cc of Depo-Medrol 40 mg with several cc's of plain xylocaine. Anesthesia was provided by ethyl chloride and a 20-gauge needle was used to inject the hip area. The injection was tolerated well.  A band aid dressing was applied.  The patient was advised to apply ice later today and tomorrow to the injection sight as needed.  I will see her in one month.  Call if any problem.  Precautions discussed.   Electronically Signed Sanjuana Kava, MD 5/21/20208:52 AM

## 2019-06-05 ENCOUNTER — Ambulatory Visit: Payer: 59 | Admitting: Orthopaedic Surgery

## 2019-06-19 ENCOUNTER — Ambulatory Visit (HOSPITAL_COMMUNITY)
Admission: RE | Admit: 2019-06-19 | Discharge: 2019-06-19 | Disposition: A | Discharge status: Home / Self Care | Payer: 59 | Source: Ambulatory Visit | Attending: Physician Assistant | Admitting: Physician Assistant

## 2019-06-19 ENCOUNTER — Other Ambulatory Visit: Payer: Self-pay

## 2019-06-19 ENCOUNTER — Other Ambulatory Visit: Payer: Self-pay | Admitting: Physician Assistant

## 2019-06-19 ENCOUNTER — Other Ambulatory Visit (HOSPITAL_COMMUNITY): Payer: Self-pay | Admitting: Physician Assistant

## 2019-06-19 DIAGNOSIS — M79662 Pain in left lower leg: Secondary | ICD-10-CM | POA: Insufficient documentation

## 2019-06-19 DIAGNOSIS — M7989 Other specified soft tissue disorders: Secondary | ICD-10-CM

## 2019-06-21 ENCOUNTER — Ambulatory Visit: Payer: 59 | Admitting: Orthopaedic Surgery

## 2019-08-07 DIAGNOSIS — F17210 Nicotine dependence, cigarettes, uncomplicated: Secondary | ICD-10-CM | POA: Diagnosis not present

## 2019-08-07 DIAGNOSIS — K219 Gastro-esophageal reflux disease without esophagitis: Secondary | ICD-10-CM | POA: Diagnosis not present

## 2019-08-07 DIAGNOSIS — I10 Essential (primary) hypertension: Secondary | ICD-10-CM | POA: Diagnosis not present

## 2019-08-07 DIAGNOSIS — K5090 Crohn's disease, unspecified, without complications: Secondary | ICD-10-CM | POA: Diagnosis not present

## 2019-08-10 DIAGNOSIS — I10 Essential (primary) hypertension: Secondary | ICD-10-CM | POA: Diagnosis not present

## 2019-08-10 DIAGNOSIS — K219 Gastro-esophageal reflux disease without esophagitis: Secondary | ICD-10-CM | POA: Diagnosis not present

## 2019-08-10 DIAGNOSIS — Z23 Encounter for immunization: Secondary | ICD-10-CM | POA: Diagnosis not present

## 2019-08-10 DIAGNOSIS — F17210 Nicotine dependence, cigarettes, uncomplicated: Secondary | ICD-10-CM | POA: Diagnosis not present

## 2019-08-10 DIAGNOSIS — I809 Phlebitis and thrombophlebitis of unspecified site: Secondary | ICD-10-CM | POA: Diagnosis not present

## 2019-08-14 ENCOUNTER — Ambulatory Visit (INDEPENDENT_AMBULATORY_CARE_PROVIDER_SITE_OTHER): Payer: Self-pay | Admitting: Internal Medicine

## 2019-10-25 DIAGNOSIS — M25571 Pain in right ankle and joints of right foot: Secondary | ICD-10-CM | POA: Diagnosis not present

## 2019-10-25 DIAGNOSIS — Z6830 Body mass index (BMI) 30.0-30.9, adult: Secondary | ICD-10-CM | POA: Diagnosis not present

## 2019-10-25 DIAGNOSIS — M25572 Pain in left ankle and joints of left foot: Secondary | ICD-10-CM | POA: Diagnosis not present

## 2019-11-09 ENCOUNTER — Other Ambulatory Visit (INDEPENDENT_AMBULATORY_CARE_PROVIDER_SITE_OTHER): Payer: Self-pay | Admitting: Internal Medicine

## 2019-11-20 ENCOUNTER — Ambulatory Visit (INDEPENDENT_AMBULATORY_CARE_PROVIDER_SITE_OTHER): Payer: BC Managed Care – PPO | Admitting: Internal Medicine

## 2019-11-20 ENCOUNTER — Encounter (INDEPENDENT_AMBULATORY_CARE_PROVIDER_SITE_OTHER): Payer: Self-pay | Admitting: Internal Medicine

## 2019-11-20 ENCOUNTER — Other Ambulatory Visit: Payer: Self-pay

## 2019-11-20 VITALS — BP 102/70 | HR 83 | Temp 97.3°F | Ht 67.0 in | Wt 200.2 lb

## 2019-11-20 DIAGNOSIS — K589 Irritable bowel syndrome without diarrhea: Secondary | ICD-10-CM

## 2019-11-20 DIAGNOSIS — K6289 Other specified diseases of anus and rectum: Secondary | ICD-10-CM

## 2019-11-20 DIAGNOSIS — K219 Gastro-esophageal reflux disease without esophagitis: Secondary | ICD-10-CM

## 2019-11-20 DIAGNOSIS — K5000 Crohn's disease of small intestine without complications: Secondary | ICD-10-CM | POA: Diagnosis not present

## 2019-11-20 DIAGNOSIS — K649 Unspecified hemorrhoids: Secondary | ICD-10-CM

## 2019-11-20 MED ORDER — NYSTATIN-TRIAMCINOLONE 100000-0.1 UNIT/GM-% EX CREA: 1.0000 "application " | TOPICAL_CREAM | Freq: Two times a day (BID) | CUTANEOUS | 0 refills | Status: DC

## 2019-11-20 MED ORDER — OXYCODONE-ACETAMINOPHEN 5-325 MG PO TABS: 1.0000 | ORAL_TABLET | Freq: Three times a day (TID) | ORAL | 0 refills | Status: DC | PRN

## 2019-11-20 MED ORDER — PROMETHAZINE HCL 25 MG PO TABS: 25.0000 mg | ORAL_TABLET | Freq: Two times a day (BID) | ORAL | 0 refills | Status: DC | PRN

## 2019-11-20 NOTE — Progress Notes (Addendum)
Presenting complaint;  Follow-up for ileal Crohn's disease, IBS and GERD.  Database and subjective:  Patient is 58 year old Caucasian female who is here for scheduled visit.  She was last seen March 2020.  She was diagnosed with ileal and cecal Crohn's disease in fall 2004.  She underwent right hemicolectomy in 2005.  She also has history of recurrent sigmoid diverticulitis for which she had low anterior resection in January 2016.  She had colonoscopy in September 2015 with removal of small tubular adenoma from transverse colon as well as sigmoid diverticulosis.  She also has intermittent diarrhea and felt to have IBS.  She states she is doing well.  On most days she has 3-4 stools per day.  Stools are always machine.  She denies abdominal pain melena or rectal bleeding.  She says she was treated with prednisone for 2-1/2 weeks for tendinitis and while she was on prednisone she had formed stools for 9 days.  She denies nocturnal diarrhea.  She feels dicyclomine is helping.  She is presently on cephalexin for stye involving right eye. She complains of intermittent swelling in irritation and itching to her external hemorrhoids.  She says when this occurs she has severe anorectal pain and unable to sleep at night.  She remains with good appetite.  Her weight has been stable. She reports omeprazole at present dose is working.  At the present time she is reluctant to reduce dose as dose reduction has not helped in the past.  She has sporadic nausea.  She would like to get a refill on promethazine. She does not take OTC NSAIDs.  Current Medications: Outpatient Encounter Medications as of 11/20/2019  Medication Sig  . albuterol (VENTOLIN HFA) 108 (90 Base) MCG/ACT inhaler SMARTSIG:2 Puff(s) By Mouth Every 4 Hours PRN  . cephALEXin (KEFLEX) 500 MG capsule Take 500 mg by mouth 2 (two) times daily.  Marland Kitchen dicyclomine (BENTYL) 10 MG capsule TAKE 1 CAPSULE BY MOUTH THREE TIMES A DAY BEFORE MEALS  . mesalamine  (PENTASA) 500 MG CR capsule Take 1,000 mg by mouth 4 (four) times daily.   Marland Kitchen neomycin-polymyxin b-dexamethasone (MAXITROL) 3.5-10000-0.1 OINT Place 1 application into the right eye at bedtime. For 2 weeks.  Marland Kitchen omeprazole (PRILOSEC) 40 MG capsule TAKE 1 CAPSULE BY MOUTH ONCE DAILY  . potassium chloride (MICRO-K) 10 MEQ CR capsule Take 10 mEq by mouth 2 (two) times daily.   . promethazine (PHENERGAN) 25 MG tablet Take 1 tablet (25 mg total) by mouth 3 (three) times daily as needed for nausea or vomiting.  . valsartan-hydrochlorothiazide (DIOVAN-HCT) 160-12.5 MG per tablet Take 1 tablet by mouth daily.   . [DISCONTINUED] cholestyramine (QUESTRAN) 4 g packet Take 1 packet (4 g total) by mouth 2 (two) times daily. (Patient not taking: Reported on 11/20/2019)  . [DISCONTINUED] potassium chloride (MICRO-K) 10 MEQ CR capsule    No facility-administered encounter medications on file as of 11/20/2019.     Objective: Blood pressure 102/70, pulse 83, temperature (!) 97.3 F (36.3 C), temperature source Oral, height 5' 7"  (1.702 m), weight 200 lb 3.2 oz (90.8 kg). Patient is alert and in no acute distress. Conjunctiva is pink. Sclera is nonicteric Oropharyngeal mucosa is normal. No neck masses or thyromegaly noted. Cardiac exam with regular rhythm normal S1 and S2. No murmur or gallop noted. Lungs are clear to auscultation. Abdomen abdomen is full but symmetrical.  Lower midline scar noted covering around right side of umbilicus.  Bowel sounds are hyperactive.  On palpation abdomen is soft  and nontender.  She has supra umbilical hernia which is completely reducible. No LE edema or clubbing noted. 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  Labs/studies Results:  CBC Latest Ref Rng & Units 01/12/2018 01/19/2016 01/10/2015  WBC 3.8 - 10.8 Thousand/uL 10.0 9.1 7.7  Hemoglobin 11.7 - 15.5 g/dL 15.0 16.1(H) 12.1  Hematocrit 35.0 - 45.0 % 41.6 45.2 35.2(L)  Platelets 140 - 400 Thousand/uL 249 228  201    CMP Latest Ref Rng & Units 01/19/2016 01/10/2015 01/09/2015  Glucose 65 - 99 mg/dL 94 100(H) 80  BUN 6 - 20 mg/dL 14 5(L) 11  Creatinine 0.44 - 1.00 mg/dL 0.70 0.68 0.68  Sodium 135 - 145 mmol/L 141 137 136  Potassium 3.5 - 5.1 mmol/L 3.5 3.9 4.1  Chloride 101 - 111 mmol/L 101 103 107  CO2 22 - 32 mmol/L 27 31 26   Calcium 8.9 - 10.3 mg/dL 9.1 8.7 8.1(L)  Total Protein 6.0 - 8.3 g/dL - - -  Total Bilirubin 0.3 - 1.2 mg/dL - - -  Alkaline Phos 39 - 117 U/L - - -  AST 0 - 37 U/L - - -  ALT 0 - 35 U/L - - -    Hepatic Function Latest Ref Rng & Units 12/27/2014 07/03/2014 02/08/2014  Total Protein 6.0 - 8.3 g/dL 6.1 6.2 6.0  Albumin 3.5 - 5.2 g/dL 3.4(L) 3.3(L) 3.8  AST 0 - 37 U/L 26 17 12   ALT 0 - 35 U/L 28 19 22   Alk Phosphatase 39 - 117 U/L 65 66 61  Total Bilirubin 0.3 - 1.2 mg/dL 0.7 0.5 0.6  Bilirubin, Direct 0.0 - 0.3 mg/dL - - 0.1    Lab Results  Component Value Date   CRP <0.5 05/25/2016    Recent lab not available for review.  Waiting for reports from Dr. Olena Heckle office.  Assessment:  #1.  History of ileal Crohn's disease.  She is presently on Pentasa/oral mesalamine and appears to be in remission.  She did not have active disease at the time of sigmoid colon resection for sigmoid diverticulitis back in January 2016.  She previously on other therapies.  She will continue oral mesalamine as long as she appears to be in remission.  #2.  Chronic GERD.  Symptoms well controlled with current therapy.  Would consider dropping PPI dose at the time of next visit.  #3.  History of hemorrhoids and sporadic anorectal pain which may be due to rectal spasm.  #4.  Sporadic nausea.   #5.  History of tubular adenoma.  Last colonoscopy was in September 2015.  Would consider next colonoscopy following her next visit in 6 months.   Plan:  Will request copy of lab from Dr. Olena Heckle office for review. Mycolog-II cream to be applied to periarea twice daily for 1 week thereafter on  as-needed basis. Percocet 5/325 1 tablet 3 times daily as needed for rectal pain.  15 doses given without refill. Promethazine 25 mg p.o. twice daily as needed. Office visit in 6 months.   Addendum Lab studies from 08/07/2019 reviewed WBC 7.9, H&H 15 and 43.8 MCV 100 and platelet count 286K. BUN 13 and creatinine 0.74 Glucose 97. Bilirubin 0.4, AP 70, AST 14, ALT 11, total protein 5.6 and albumin 3.6. Serum calcium 8.4.  Serum albumin and serum calcium are mildly decreased. This will be repeated at the time of next office visit.

## 2019-11-20 NOTE — Patient Instructions (Signed)
Will request copy of lab from Dr. Arcola Jansky

## 2019-11-23 DIAGNOSIS — I82812 Embolism and thrombosis of superficial veins of left lower extremities: Secondary | ICD-10-CM | POA: Diagnosis not present

## 2019-11-26 ENCOUNTER — Other Ambulatory Visit (HOSPITAL_COMMUNITY): Payer: Self-pay | Admitting: Family Medicine

## 2019-11-26 ENCOUNTER — Other Ambulatory Visit: Payer: Self-pay | Admitting: Family Medicine

## 2019-11-26 DIAGNOSIS — M79605 Pain in left leg: Secondary | ICD-10-CM

## 2019-11-27 ENCOUNTER — Ambulatory Visit (HOSPITAL_COMMUNITY)
Admission: RE | Admit: 2019-11-27 | Discharge: 2019-11-27 | Disposition: A | Discharge status: Home / Self Care | Payer: BC Managed Care – PPO | Source: Ambulatory Visit | Attending: Family Medicine | Admitting: Family Medicine

## 2019-11-27 ENCOUNTER — Other Ambulatory Visit: Payer: Self-pay

## 2019-11-27 ENCOUNTER — Ambulatory Visit (HOSPITAL_COMMUNITY): Payer: BC Managed Care – PPO

## 2019-11-27 DIAGNOSIS — M79662 Pain in left lower leg: Secondary | ICD-10-CM | POA: Diagnosis not present

## 2019-11-27 DIAGNOSIS — M7989 Other specified soft tissue disorders: Secondary | ICD-10-CM | POA: Diagnosis not present

## 2019-11-27 DIAGNOSIS — M79605 Pain in left leg: Secondary | ICD-10-CM | POA: Diagnosis not present

## 2019-12-17 ENCOUNTER — Telehealth (INDEPENDENT_AMBULATORY_CARE_PROVIDER_SITE_OTHER): Payer: Self-pay | Admitting: *Deleted

## 2019-12-17 DIAGNOSIS — R109 Unspecified abdominal pain: Secondary | ICD-10-CM

## 2019-12-17 DIAGNOSIS — K5000 Crohn's disease of small intestine without complications: Secondary | ICD-10-CM

## 2019-12-17 DIAGNOSIS — R112 Nausea with vomiting, unspecified: Secondary | ICD-10-CM

## 2019-12-17 MED ORDER — PREDNISONE 50 MG PO TABS: ORAL_TABLET | ORAL | 0 refills | Status: DC

## 2019-12-17 MED ORDER — DIPHENHYDRAMINE HCL 25 MG PO CAPS: 50.0000 mg | ORAL_CAPSULE | Freq: Once | ORAL | 0 refills | Status: DC

## 2019-12-17 NOTE — Telephone Encounter (Signed)
Case discussed with Dr. Laural Golden.  Recommends labs, CT scan, stool studies to exclude C. difficile.  I called patient with update of plan of care.  Elizabeth [name] reports that she has a different [employer] plan than in chart.  She will be available in the morning to give new insurance information.  Has had severe rash and redness with contrast in past, prednisone prescription and Benadryl prescription sent to pharmacy for her to pick up.

## 2019-12-17 NOTE — Addendum Note (Signed)
Addended by: Laurine Blazer A on: 12/17/2019 05:05 PM   Modules accepted: Orders, SmartSet

## 2019-12-17 NOTE — Addendum Note (Signed)
Addended by: Laurine Blazer A on: 12/17/2019 04:53 PM   Modules accepted: Miquel Dunn

## 2019-12-17 NOTE — Telephone Encounter (Signed)
Patient called , left a message. States that she has had a rough day.  She states that all her Bm's are forest green in color. She wants to make sure there is nothing that she should be worried about. Call back number is [phone].

## 2019-12-17 NOTE — Telephone Encounter (Signed)
Reports having forest green stools over past 2 weeks and this morning began having vomiting - 2 episodes at work, followed by lower abdominal pain.   Has had 4-5 BM/day but hasn't eaten anything today. Having worsening abd cramps today and abd feels "sore". Has taken dicyclomine today.  Took antiemetic and has not had vomiting since.  Has been on doxycycline 2 courses in the past month for styes, finished 2 weeks.    Chronically vomiting after greasy foods but feels today symptoms are different.  She has been afebrile.  Yesterday had pasta and green beans. Gets tested for COVID 2x/week and this has been negative.

## 2019-12-18 ENCOUNTER — Other Ambulatory Visit (INDEPENDENT_AMBULATORY_CARE_PROVIDER_SITE_OTHER): Payer: Self-pay | Admitting: Internal Medicine

## 2019-12-18 DIAGNOSIS — R109 Unspecified abdominal pain: Secondary | ICD-10-CM | POA: Diagnosis not present

## 2019-12-18 DIAGNOSIS — R112 Nausea with vomiting, unspecified: Secondary | ICD-10-CM | POA: Diagnosis not present

## 2019-12-18 DIAGNOSIS — K5000 Crohn's disease of small intestine without complications: Secondary | ICD-10-CM | POA: Diagnosis not present

## 2019-12-18 NOTE — Telephone Encounter (Signed)
Soonest [facility] could do CT is 12/24/19 at 130 (1:15), npo 4 hours, pick up contrast, patient aware

## 2019-12-19 ENCOUNTER — Other Ambulatory Visit (INDEPENDENT_AMBULATORY_CARE_PROVIDER_SITE_OTHER): Payer: Self-pay | Admitting: Gastroenterology

## 2019-12-19 DIAGNOSIS — R197 Diarrhea, unspecified: Secondary | ICD-10-CM

## 2019-12-19 DIAGNOSIS — K5000 Crohn's disease of small intestine without complications: Secondary | ICD-10-CM | POA: Diagnosis not present

## 2019-12-19 LAB — COMPLETE METABOLIC PANEL WITH GFR
AG Ratio: 1.8 (calc) (ref 1.0–2.5)
ALT: 16 U/L (ref 6–29)
AST: 19 U/L (ref 10–35)
Albumin: 3.9 g/dL (ref 3.6–5.1)
Alkaline phosphatase (APISO): 71 U/L (ref 37–153)
BUN: 14 mg/dL (ref 7–25)
CO2: 27 mmol/L (ref 20–32)
Calcium: 8.7 mg/dL (ref 8.6–10.4)
Chloride: 103 mmol/L (ref 98–110)
Creat: 0.88 mg/dL (ref 0.50–0.99)
GFR, Est African American: 83 mL/min/{1.73_m2} (ref >=60)
GFR, Est Non African American: 71 mL/min/{1.73_m2} (ref >=60)
Globulin: 2.2 g/dL (calc) (ref 1.9–3.7)
Glucose, Bld: 101 mg/dL (ref 65–139)
Potassium: 3.2 mmol/L — ABNORMAL LOW (ref 3.5–5.3)
Sodium: 140 mmol/L (ref 135–146)
Total Bilirubin: 0.5 mg/dL (ref 0.2–1.2)
Total Protein: 6.1 g/dL (ref 6.1–8.1)

## 2019-12-19 LAB — CBC
HCT: 44.8 % (ref 35.0–45.0)
Hemoglobin: 16.1 g/dL — ABNORMAL HIGH (ref 11.7–15.5)
MCH: 35.5 pg — ABNORMAL HIGH (ref 27.0–33.0)
MCHC: 35.9 g/dL (ref 32.0–36.0)
MCV: 98.9 fL (ref 80.0–100.0)
MPV: 9.7 fL (ref 7.5–12.5)
Platelets: 276 10*3/uL (ref 140–400)
RBC: 4.53 10*6/uL (ref 3.80–5.10)
RDW: 12.9 % (ref 11.0–15.0)
WBC: 10.3 10*3/uL (ref 3.8–10.8)

## 2019-12-19 LAB — SEDIMENTATION RATE: Sed Rate: 6 mm/h (ref 0–30)

## 2019-12-19 LAB — C-REACTIVE PROTEIN: CRP: 5.3 mg/L (ref <8.0)

## 2019-12-19 NOTE — Progress Notes (Signed)
Discussed lab results with patient.  Her potassium is 3.2, otherwise labs normal.  She is on 20 mg potassium supplement.  She will try to increase potassium in her diet.  She is not able to do her stool sample yesterday, order reentered for today. Continues to have liquid stools but no further vomiting. Also provided her a work excuse. CT scheduled for next week, unable to get sooner.

## 2019-12-20 ENCOUNTER — Telehealth (INDEPENDENT_AMBULATORY_CARE_PROVIDER_SITE_OTHER): Payer: Self-pay | Admitting: *Deleted

## 2019-12-20 NOTE — Telephone Encounter (Signed)
Patient called this morning for test result of the C-Diff. When I looked @ 9:22 am it indicated that the order was active. Patient was called back and made aware. She states that she cannot return to work until she has the result.

## 2019-12-21 ENCOUNTER — Telehealth (INDEPENDENT_AMBULATORY_CARE_PROVIDER_SITE_OTHER): Payer: Self-pay | Admitting: Gastroenterology

## 2019-12-21 LAB — CLOSTRIDIUM DIFFICILE TOXIN B, QUALITATIVE, REAL-TIME PCR: Toxigenic C. Difficile by PCR: DETECTED — AB

## 2019-12-21 MED ORDER — VANCOMYCIN HCL 125 MG PO CAPS: 125.0000 mg | ORAL_CAPSULE | Freq: Four times a day (QID) | ORAL | 0 refills | Status: AC

## 2019-12-21 NOTE — Telephone Encounter (Signed)
Patient positive for C diff. Vanc sent to pharmacy. Will call pt w/ results.

## 2019-12-21 NOTE — Telephone Encounter (Signed)
Discussed results w/ patient. She will pick up vanc. Has had severe SE w/ flagyl in past.   Will hold off on CT scan planned for next week given C diff would explain symptoms-can pursue if symptoms continue after treatment.   She will call Monday w/ update of how she is feeling. Needs note symptoms resolved to be cleared to go back to work.

## 2019-12-24 ENCOUNTER — Ambulatory Visit (HOSPITAL_COMMUNITY): Admission: RE | Admit: 2019-12-24 | Payer: BC Managed Care – PPO | Source: Ambulatory Visit

## 2019-12-24 NOTE — Telephone Encounter (Signed)
Elizabeth [name] - can you call patient and see how she is doing? She sent me a text message that she wasn't feeling much better and may need a work note.  Okay to provide work note if needed

## 2019-12-24 NOTE — Telephone Encounter (Signed)
Patient states that she started the medication on this past Friday. She has been experiencing nausea. She has taken a Phenergan and this has helped with that. Her stools are still jelly like , abdominal pain is better.   Patient states that she cannot return to work until all clear , she ask if she needs to come to office this Thursday and see you , if things are not any better she will get another note for work.  Please advise.Mitize , if patient needs appointment.

## 2019-12-24 NOTE — Telephone Encounter (Signed)
Please notify patient I can give her a note for work excuse without her coming in for appointment.  I would like her to call on Wednesday with an update of how she is feeling.  Hopefully by that point the antibiotic will have helped symptoms.  Please let me know if she needs more Phenergan.

## 2019-12-24 NOTE — Telephone Encounter (Signed)
Patient called and given Janice's recommendation. She will call our office on Wednesday with another progress report, She will also let us know at that time if she needs more Phenergan.

## 2019-12-26 ENCOUNTER — Telehealth (INDEPENDENT_AMBULATORY_CARE_PROVIDER_SITE_OTHER): Payer: Self-pay | Admitting: *Deleted

## 2019-12-26 ENCOUNTER — Encounter (INDEPENDENT_AMBULATORY_CARE_PROVIDER_SITE_OTHER): Payer: Self-pay | Admitting: *Deleted

## 2019-12-26 MED ORDER — PROMETHAZINE HCL 25 MG PO TABS: 25.0000 mg | ORAL_TABLET | Freq: Two times a day (BID) | ORAL | 0 refills | Status: DC | PRN

## 2019-12-26 NOTE — Telephone Encounter (Signed)
Please notify patient I'm glad that she is feeling a lot better.  It is okay to give her a work note for rest of the week.  I will send more Phenergan.  She can try Gas-X for gas and belching.  Make sure she is limiting trigger foods such as beans, soft drinks, etc.  Would recommend bland diet while on vanc. Thanks.

## 2019-12-26 NOTE — Telephone Encounter (Signed)
Thayer Headings - Patient called this morning with a Progress Report. She states that she is feeling a  lot better , she is requesting a work not through the time the last one was written to this upcoming Monday, January 18,2021. Dhe will be by later today or tomorrow to pick it up.  She is also requesting a refill on her Phenergan.  She is also complaining of a lot of gas and belching, she is asking for something to help with that.

## 2019-12-26 NOTE — Telephone Encounter (Signed)
Attempted tp call the patient at the number she requested. No voicemail has been set up. Patient was sent patient advice message.

## 2019-12-27 ENCOUNTER — Encounter (INDEPENDENT_AMBULATORY_CARE_PROVIDER_SITE_OTHER): Payer: Self-pay | Admitting: Gastroenterology

## 2020-02-18 DIAGNOSIS — L820 Inflamed seborrheic keratosis: Secondary | ICD-10-CM | POA: Diagnosis not present

## 2020-03-10 DIAGNOSIS — Z13220 Encounter for screening for lipoid disorders: Secondary | ICD-10-CM | POA: Diagnosis not present

## 2020-03-10 DIAGNOSIS — I10 Essential (primary) hypertension: Secondary | ICD-10-CM | POA: Diagnosis not present

## 2020-03-10 DIAGNOSIS — K219 Gastro-esophageal reflux disease without esophagitis: Secondary | ICD-10-CM | POA: Diagnosis not present

## 2020-03-10 DIAGNOSIS — F17210 Nicotine dependence, cigarettes, uncomplicated: Secondary | ICD-10-CM | POA: Diagnosis not present

## 2020-03-27 DIAGNOSIS — F17210 Nicotine dependence, cigarettes, uncomplicated: Secondary | ICD-10-CM | POA: Diagnosis not present

## 2020-03-27 DIAGNOSIS — M79671 Pain in right foot: Secondary | ICD-10-CM | POA: Diagnosis not present

## 2020-03-27 DIAGNOSIS — M79672 Pain in left foot: Secondary | ICD-10-CM | POA: Diagnosis not present

## 2020-03-27 DIAGNOSIS — I10 Essential (primary) hypertension: Secondary | ICD-10-CM | POA: Diagnosis not present

## 2020-03-27 DIAGNOSIS — I809 Phlebitis and thrombophlebitis of unspecified site: Secondary | ICD-10-CM | POA: Diagnosis not present

## 2020-03-27 DIAGNOSIS — K439 Ventral hernia without obstruction or gangrene: Secondary | ICD-10-CM | POA: Diagnosis not present

## 2020-03-27 DIAGNOSIS — Z23 Encounter for immunization: Secondary | ICD-10-CM | POA: Diagnosis not present

## 2020-03-27 DIAGNOSIS — K219 Gastro-esophageal reflux disease without esophagitis: Secondary | ICD-10-CM | POA: Diagnosis not present

## 2020-03-27 DIAGNOSIS — K5090 Crohn's disease, unspecified, without complications: Secondary | ICD-10-CM | POA: Diagnosis not present

## 2020-04-21 DIAGNOSIS — Z6831 Body mass index (BMI) 31.0-31.9, adult: Secondary | ICD-10-CM | POA: Diagnosis not present

## 2020-04-21 DIAGNOSIS — M545 Low back pain: Secondary | ICD-10-CM | POA: Diagnosis not present

## 2020-05-06 IMAGING — US US EXTREM LOW VENOUS*L*
1 series · 13 of 24 positions shown · non-contrast
Comparison: Left lower extremity venous Doppler
ultrasound-06/19/2019

CLINICAL DATA: Left lower extremity pain and swelling for the past
month. Evaluate for DVT.



[Series 1: us extrem low venous*left* · 0.08mm/px · 13 of 35 slices shown]
[im 1/35]
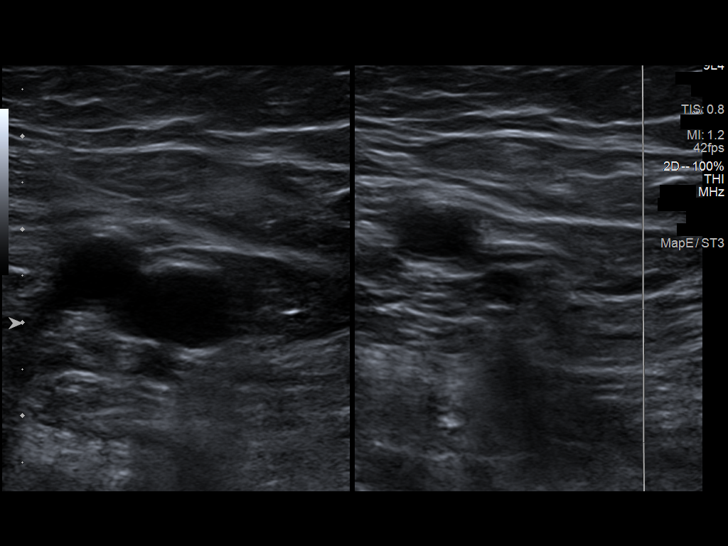
[im 3/35]
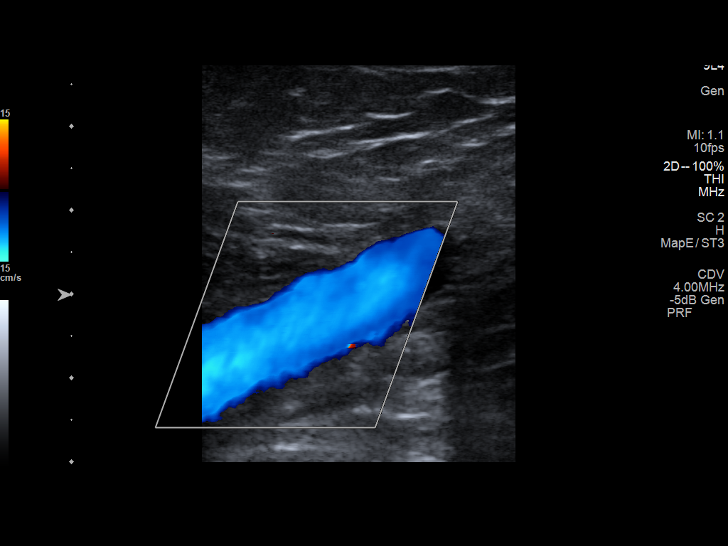
[im 6/35]
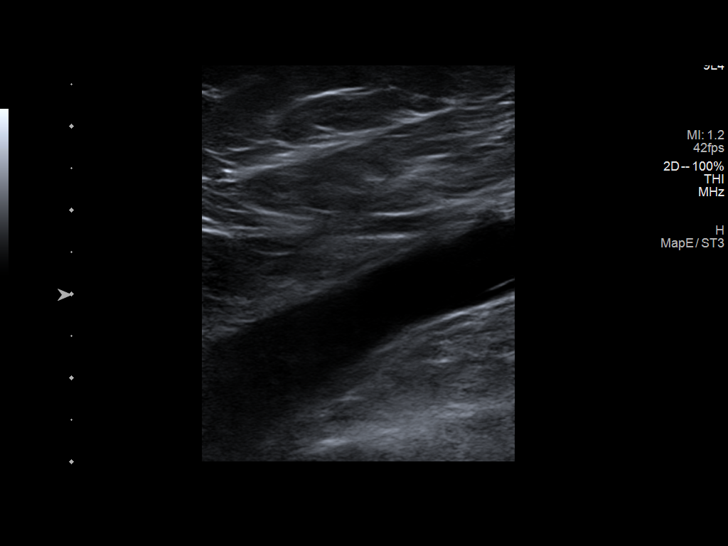
[im 9/35]
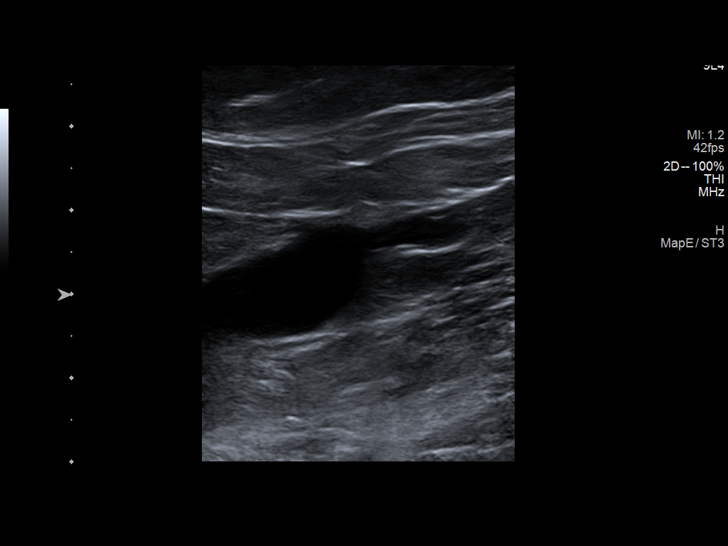
[im 12/35]
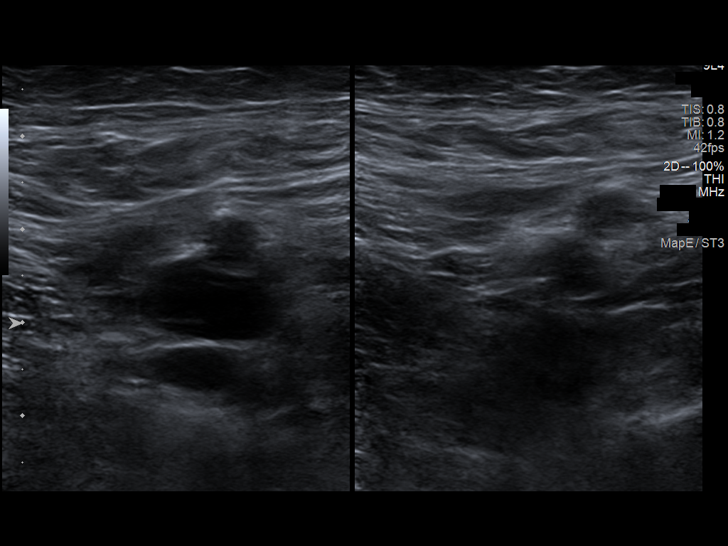
[im 15/35]
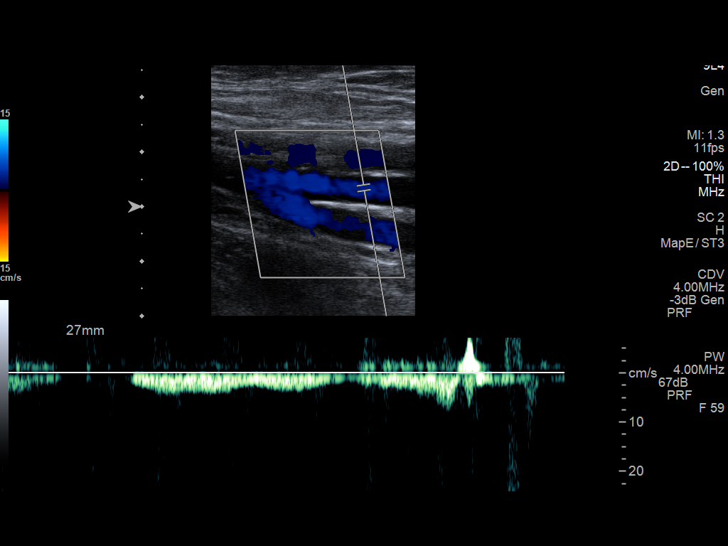
[im 18/35]
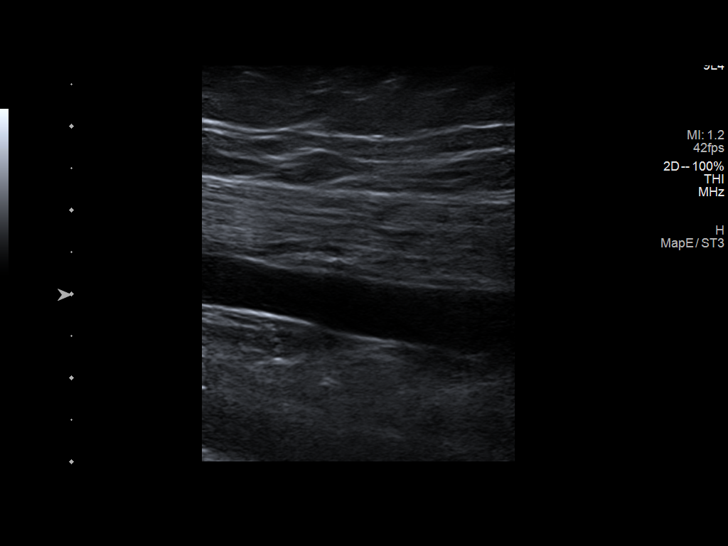
[im 20/35]
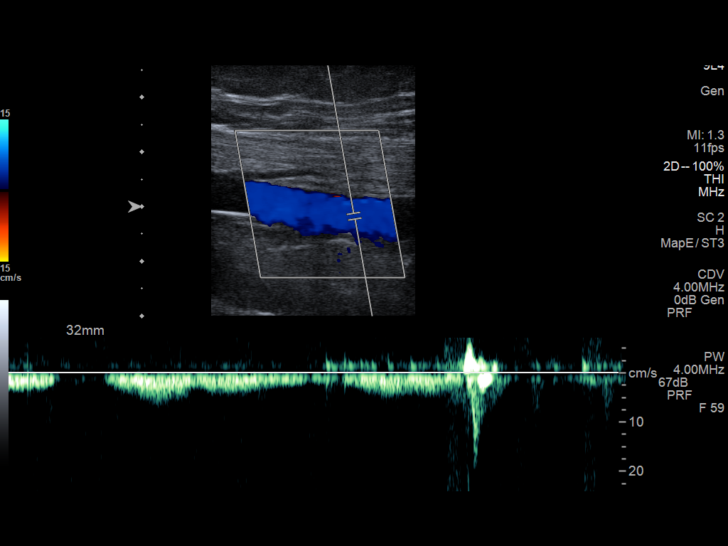
[im 23/35]
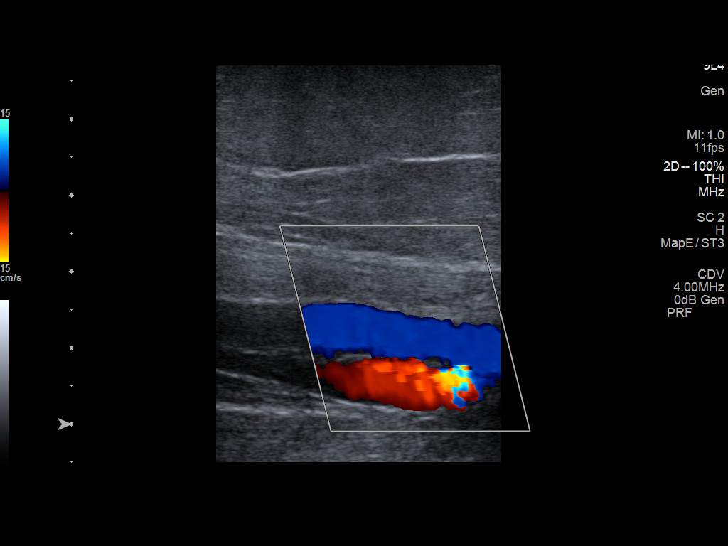
[im 26/35]
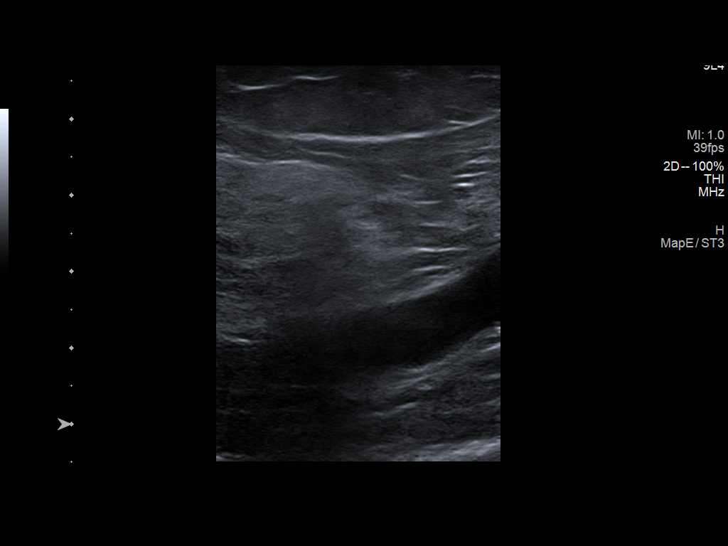
[im 29/35]
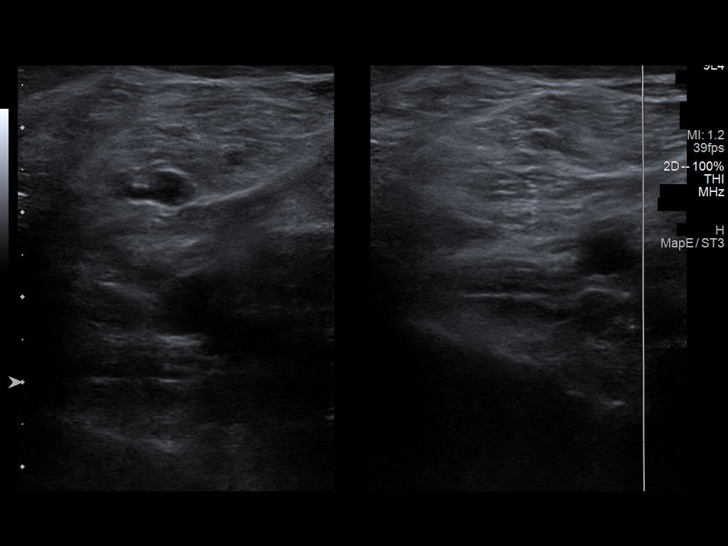
[im 32/35]
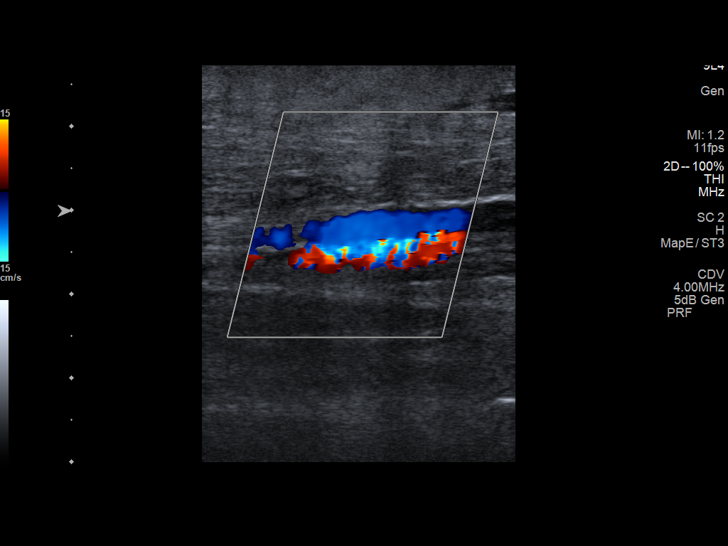
[im 35/35]
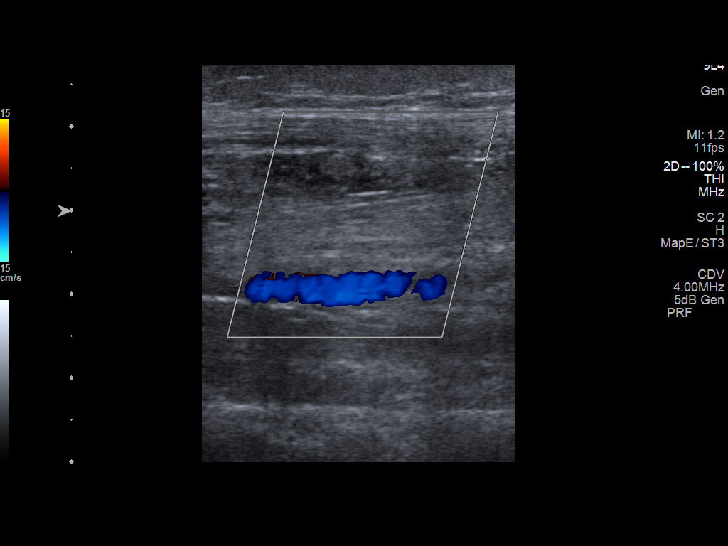

[13 of 24 positions shown; findings below may reference images not displayed]

FINDINGS: Contralateral Common Femoral Vein: Respiratory phasicity is normal
and symmetric with the symptomatic side. No evidence of thrombus.
Normal compressibility.

Common Femoral Vein: No evidence of thrombus. Normal
compressibility, respiratory phasicity and response to augmentation.

Saphenofemoral Junction: No evidence of thrombus. Normal
compressibility and flow on color Doppler imaging.

Profunda Femoral Vein: No evidence of thrombus. Normal
compressibility and flow on color Doppler imaging.

Femoral Vein: No evidence of thrombus. Normal compressibility,
respiratory phasicity and response to augmentation.

Popliteal Vein: No evidence of thrombus. Normal compressibility,
respiratory phasicity and response to augmentation.

Calf Veins: No evidence of thrombus. Normal compressibility and flow
on color Doppler imaging.

Superficial Great Saphenous Vein: No evidence of thrombus. Normal
compressibility.

Venous Reflux:  None.

Other Findings: Previously noted superficial thrombophlebitis is not
demonstrated on the present examination.
IMPRESSION: No evidence of DVT within the left lower extremity.

## 2020-05-20 ENCOUNTER — Telehealth (INDEPENDENT_AMBULATORY_CARE_PROVIDER_SITE_OTHER): Payer: Self-pay | Admitting: Internal Medicine

## 2020-05-20 ENCOUNTER — Other Ambulatory Visit: Payer: Self-pay

## 2020-05-20 ENCOUNTER — Encounter (INDEPENDENT_AMBULATORY_CARE_PROVIDER_SITE_OTHER): Payer: Self-pay | Admitting: Internal Medicine

## 2020-05-20 ENCOUNTER — Ambulatory Visit (INDEPENDENT_AMBULATORY_CARE_PROVIDER_SITE_OTHER): Payer: BC Managed Care – PPO | Admitting: Internal Medicine

## 2020-05-20 VITALS — BP 125/81 | HR 98 | Temp 96.4°F | Ht 67.0 in | Wt 204.8 lb

## 2020-05-20 DIAGNOSIS — M62830 Muscle spasm of back: Secondary | ICD-10-CM

## 2020-05-20 DIAGNOSIS — K5000 Crohn's disease of small intestine without complications: Secondary | ICD-10-CM

## 2020-05-20 DIAGNOSIS — K580 Irritable bowel syndrome with diarrhea: Secondary | ICD-10-CM

## 2020-05-20 MED ORDER — METHYLPREDNISOLONE 4 MG PO TBPK: ORAL_TABLET | ORAL | 0 refills | Status: DC

## 2020-05-20 NOTE — Patient Instructions (Signed)
Use Medrol as follows 6 tablets today, 5 tablets tomorrow and 4 tablets day two, 3 tablets on day three, 2 tablets on day four, 1 tablet on day five and stop. Request copy of recent blood work from Dr. Olena Heckle office. Colonoscopy to be planned this fall.

## 2020-05-20 NOTE — Progress Notes (Addendum)
Presenting complaint;  Follow for Crohn's disease and GERD.  Database and subjective:  Patient is 59-year-old Caucasian female history of small bowel Crohn's disease status post right hemicolectomy at [facility] years ago and status post sigmoid colectomy for diverticulitis in January 2016 who is here for scheduled visit.  She was last seen in December 2020. Patient states she is doing well from GI standpoint.  She has 3-4 bowel movements per day.  This is her baseline.  Most of her stools are loose to mushy.  She denies nocturnal bowel movement.  She denies melena or rectal bleeding.  She feels heartburns well controlled with therapy.  Her appetite is good.  She has gained 4 pounds since her last visit.  She denies dysphagia nausea or vomiting. Patient says she was trying to help her husband stand up last Tuesday and pulled a muscle in her back.  She was seen by Dr. Daniel and given prednisone 50 mg daily for few days.  She says it was not tapered.  She feels bad.  She is still having back pain.  She feels she would do better with prednisone taper.  She has taken prednisone in the remote past for Crohn's disease. She is try to quit cigarette smoking.  She is down to 2 cigarettes/day. Patient says she had blood work by Dr. Terry Daniel few weeks ago.  Current Medications: Outpatient Encounter Medications as of 05/20/2020  Medication Sig  . albuterol (VENTOLIN HFA) 108 (90 Base) MCG/ACT inhaler SMARTSIG:2 Puff(s) By Mouth Every 4 Hours PRN  . dicyclomine (BENTYL) 10 MG capsule TAKE 1 CAPSULE BY MOUTH THREE TIMES A DAY BEFORE MEALS  . diphenhydrAMINE (BENADRYL ALLERGY) 25 mg capsule Take 2 capsules (50 mg total) by mouth once for 1 dose. Take 1 hour prior to study  . mesalamine (PENTASA) 500 MG CR capsule Take 1,000 mg by mouth 4 (four) times daily.   . omeprazole (PRILOSEC) 40 MG capsule TAKE 1 CAPSULE BY MOUTH ONCE DAILY  . potassium chloride (MICRO-K) 10 MEQ CR capsule Take 10 mEq by mouth 2  (two) times daily.   . promethazine (PHENERGAN) 25 MG tablet Take 1 tablet (25 mg total) by mouth 2 (two) times daily as needed for nausea or vomiting.  . valsartan-hydrochlorothiazide (DIOVAN-HCT) 160-12.5 MG per tablet Take 1 tablet by mouth daily.   . oxyCODONE-acetaminophen (PERCOCET/ROXICET) 5-325 MG tablet Take 1 tablet by mouth every 8 (eight) hours as needed for severe pain. (Patient not taking: Reported on 05/20/2020)  . predniSONE (DELTASONE) 50 MG tablet Take at 13 hours, 7 hours, and 1 hour prior to CT (Patient not taking: Reported on 05/20/2020)  . [DISCONTINUED] cephALEXin (KEFLEX) 500 MG capsule Take 500 mg by mouth 2 (two) times daily.  . [DISCONTINUED] neomycin-polymyxin b-dexamethasone (MAXITROL) 3.5-10000-0.1 OINT Place 1 application into the right eye at bedtime. For 2 weeks.  . [DISCONTINUED] nystatin-triamcinolone (MYCOLOG II) cream APPLY TO AFFECTED AREA TWICE A DAY (Patient not taking: Reported on 05/20/2020)   No facility-administered encounter medications on file as of 05/20/2020.     Objective: Blood pressure 125/81, pulse 98, temperature (!) 96.4 F (35.8 C), temperature source Temporal, height 5' 7" (1.702 m), weight 204 lb 12.8 oz (92.9 kg). Patient is alert and in no acute distress. She is wearing facial mask. Conjunctiva is pink. Sclera is nonicteric Oropharyngeal mucosa is normal. No neck masses or thyromegaly noted. Cardiac exam with regular rhythm normal S1 and S2. No murmur or gallop noted. Lungs are clear to   auscultation. Abdomen is full.  She has low midline scar.  Bowel sounds are hyperactive.  On palpation abdomen is soft and nontender with organomegaly or masses. No LE edema or clubbing noted. 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  Labs/studies Results:  CBC Latest Ref Rng & Units 12/18/2019 01/12/2018 01/19/2016  WBC 3.8 - 10.8 Thousand/uL 10.3 10.0 9.1  Hemoglobin 11.7 - 15.5 g/dL 16.1(H) 15.0 16.1(H)  Hematocrit 35.0 - 45.0 % 44.8 41.6 45.2   Platelets 140 - 400 Thousand/uL 276 249 228    CMP Latest Ref Rng & Units 12/18/2019 01/19/2016 01/10/2015  Glucose 65 - 139 mg/dL 101 94 100(H)  BUN 7 - 25 mg/dL 14 14 5(L)  Creatinine 0.50 - 0.99 mg/dL 0.88 0.70 0.68  Sodium 135 - 146 mmol/L 140 141 137  Potassium 3.5 - 5.3 mmol/L 3.2(L) 3.5 3.9  Chloride 98 - 110 mmol/L 103 101 103  CO2 20 - 32 mmol/L _0 Calcium 8.6 - 10.4 mg/dL 8.7 9.1 8.7  Total Protein 6.1 - 8.1 g/dL 6.1 - -  Total Bilirubin 0.2 - 1.2 mg/dL 0.5 - -  Alkaline Phos 39 - 117 U/L - - -  AST 10 - 35 U/L 19 - -  ALT 6 - 29 U/L 16 - -    Hepatic Function Latest Ref Rng & Units 12/18/2019 12/27/2014 07/03/2014  Total Protein 6.1 - 8.1 g/dL 6.1 6.1 6.2  Albumin 3.5 - 5.2 g/dL - 3.4(L) 3.3(L)  AST 10 - 35 U/L _1 ALT 6 - 29 U/L _2 Alk Phosphatase 39 - 117 U/L - 65 66  Total Bilirubin 0.2 - 1.2 mg/dL 0.5 0.7 0.5  Bilirubin, Direct 0.0 - 0.3 mg/dL - - -    Lab Results  Component Value Date   CRP 5.3 12/18/2019    Recent lab data to be obtained from Dr. Olena Heckle office.  Assessment:  #1.  Small bowel Crohn's disease.  She remains on oral mesalamine. Her diarrhea with appear to be due to IBS.  I believe she is in clinical remission.  No plans to escalate therapy unless clear-cut evidence of progressive disease.  #2.  Chronic GERD.  She is doing well with therapy.  Would consider changing dose to every other day at some point in future when she is ready.  #3.  History of tubular adenoma.  Last colonoscopy was in September 2015.  #4.  Back pain/spasm.   Plan:  Request copy of recent blood work from Dr. Olena Heckle office. Patient will continue omeprazole and Pentasa as before. She will also continue dicyclomine 10 mg before each meal. We will prescribe Medrol Dosepak since she cannot take NSAIDs. Surveillance colonoscopy later this year. Office visit in 1 year.  Addendum   Cologuard also Dr. Olena Heckle office reviewed 03/10/2020 BUN 12 creatinine  0.73 glucose 97 Serum sodium 139, potassium 3.4, chloride 101, CO2 23 Serum calcium 8.8. Bilirubin 0.5, AP 85, AST 21, ALT 19 albumin 3.7 Cholesterol 194 Triglyceride 153 HDL 47 and LDL 120.  Patient called.  Patient says she is not feeling well she has not been able to eat since last week Tuesday.  Her back pain has improved.  She told me she is heading to the emergency room at [facility]. Will ask ER physician to also do CRP with rest of her blood work.

## 2020-05-20 NOTE — Telephone Encounter (Signed)
Patient presented to office - stated she does not have new insurance card yet

## 2020-05-23 ENCOUNTER — Telehealth (INDEPENDENT_AMBULATORY_CARE_PROVIDER_SITE_OTHER): Payer: Self-pay | Admitting: *Deleted

## 2020-05-23 NOTE — Telephone Encounter (Signed)
Patient  Called the office this mo rning. She states that she has N&V, watery diarrhea , bad abdominal pain. Patient has Crohn's Disease. She says that this has worsened since her OV earlier this week.  Dr.Rehman has been notified and he recommends that the patient go to the ED for further evaluation.  Patient was called and made aware.Patient says that she will make some phone calls, she is caring for her husband who has a broken hip.

## 2020-05-27 ENCOUNTER — Other Ambulatory Visit: Payer: Self-pay

## 2020-05-27 ENCOUNTER — Emergency Department (HOSPITAL_COMMUNITY)
Admission: EM | Admit: 2020-05-27 | Discharge: 2020-05-27 | Disposition: A | Discharge status: Home / Self Care | Payer: Self-pay | Attending: Emergency Medicine | Admitting: Emergency Medicine

## 2020-05-27 ENCOUNTER — Encounter (HOSPITAL_COMMUNITY): Payer: Self-pay | Admitting: Emergency Medicine

## 2020-05-27 DIAGNOSIS — Z5321 Procedure and treatment not carried out due to patient leaving prior to being seen by health care provider: Secondary | ICD-10-CM | POA: Insufficient documentation

## 2020-05-27 DIAGNOSIS — R109 Unspecified abdominal pain: Secondary | ICD-10-CM | POA: Insufficient documentation

## 2020-05-27 LAB — CBC
HCT: 42.9 % (ref 36.0–46.0)
Hemoglobin: 15.3 g/dL — ABNORMAL HIGH (ref 12.0–15.0)
MCH: 35 pg — ABNORMAL HIGH (ref 26.0–34.0)
MCHC: 35.7 g/dL (ref 30.0–36.0)
MCV: 98.2 fL (ref 80.0–100.0)
Platelets: 274 10*3/uL (ref 150–400)
RBC: 4.37 MIL/uL (ref 3.87–5.11)
RDW: 12.4 % (ref 11.5–15.5)
WBC: 13.8 10*3/uL — ABNORMAL HIGH (ref 4.0–10.5)
nRBC: 0 % (ref 0.0–0.2)

## 2020-05-27 LAB — COMPREHENSIVE METABOLIC PANEL
ALT: 26 U/L (ref 0–44)
AST: 19 U/L (ref 15–41)
Albumin: 3.3 g/dL — ABNORMAL LOW (ref 3.5–5.0)
Alkaline Phosphatase: 74 U/L (ref 38–126)
Anion gap: 13 (ref 5–15)
BUN: 43 mg/dL — ABNORMAL HIGH (ref 8–23)
CO2: 19 mmol/L — ABNORMAL LOW (ref 22–32)
Calcium: 7.8 mg/dL — ABNORMAL LOW (ref 8.9–10.3)
Chloride: 101 mmol/L (ref 98–111)
Creatinine, Ser: 2.58 mg/dL — ABNORMAL HIGH (ref 0.44–1.00)
GFR calc Af Amer: 22 mL/min — ABNORMAL LOW (ref >60)
GFR calc non Af Amer: 19 mL/min — ABNORMAL LOW (ref >60)
Glucose, Bld: 110 mg/dL — ABNORMAL HIGH (ref 70–99)
Potassium: 3.2 mmol/L — ABNORMAL LOW (ref 3.5–5.1)
Sodium: 133 mmol/L — ABNORMAL LOW (ref 135–145)
Total Bilirubin: 1 mg/dL (ref 0.3–1.2)
Total Protein: 6.5 g/dL (ref 6.5–8.1)

## 2020-05-27 LAB — LIPASE, BLOOD: Lipase: 29 U/L (ref 11–51)

## 2020-05-27 NOTE — ED Triage Notes (Signed)
Abdominal pain for the last 7 days, endorses n/v/d, denies chills and fevers. History of chron's and c-diff.

## 2020-06-11 ENCOUNTER — Telehealth (INDEPENDENT_AMBULATORY_CARE_PROVIDER_SITE_OTHER): Payer: Self-pay | Admitting: *Deleted

## 2020-06-11 ENCOUNTER — Encounter (INDEPENDENT_AMBULATORY_CARE_PROVIDER_SITE_OTHER): Payer: Self-pay | Admitting: *Deleted

## 2020-06-11 NOTE — Telephone Encounter (Signed)
Patient left a telephone message with the following information. She has been back in the hospital , appears ED June 24 or the 28  With N&V , Diarrhea.  She has been advised that she has the C-Diff again. She is on her second round of Vancomycin. She reports that her Cardiac Levels are low as well as her Magnesium, and Calicum levels. She says that she is taking a lot of vitamins . Her legs are very swollen.  Patient is wanting to know if  Dr.Rehman or PA has any other  Suggestions for her treatment.   Results for orders placed or performed during the hospital encounter of 05/27/20  Lipase, blood  Result Value Ref Range   Lipase 29 11 - 51 U/L  Comprehensive metabolic panel  Result Value Ref Range   Sodium 133 (L) 135 - 145 mmol/L   Potassium 3.2 (L) 3.5 - 5.1 mmol/L   Chloride 101 98 - 111 mmol/L   CO2 19 (L) 22 - 32 mmol/L   Glucose, Bld 110 (H) 70 - 99 mg/dL   BUN 43 (H) 8 - 23 mg/dL   Creatinine, Ser 2.58 (H) 0.44 - 1.00 mg/dL   Calcium 7.8 (L) 8.9 - 10.3 mg/dL   Total Protein 6.5 6.5 - 8.1 g/dL   Albumin 3.3 (L) 3.5 - 5.0 g/dL   AST 19 15 - 41 U/L   ALT 26 0 - 44 U/L   Alkaline Phosphatase 74 38 - 126 U/L   Total Bilirubin 1.0 0.3 - 1.2 mg/dL   GFR calc non Af Amer 19 (L) >60 mL/min   GFR calc Af Amer 22 (L) >60 mL/min   Anion gap 13 5 - 15  CBC  Result Value Ref Range   WBC 13.8 (H) 4.0 - 10.5 K/uL   RBC 4.37 3.87 - 5.11 MIL/uL   Hemoglobin 15.3 (H) 12.0 - 15.0 g/dL   HCT 42.9 36 - 46 %   MCV 98.2 80.0 - 100.0 fL   MCH 35.0 (H) 26.0 - 34.0 pg   MCHC 35.7 30.0 - 36.0 g/dL   RDW 12.4 11.5 - 15.5 %   Platelets 274 150 - 400 K/uL   nRBC 0.0 0.0 - 0.2 %   was with N&V a

## 2020-06-11 NOTE — Telephone Encounter (Signed)
I have sent the patient a MY Chart message asking the questions that Laurine Blazer  [facility] had for the patient.

## 2020-06-11 NOTE — Telephone Encounter (Signed)
[name] can you check what dose of vancomycin she is on and if they instruct her to taper it or simply complete a 14-day course. Thanks

## 2020-06-12 DIAGNOSIS — Z86718 Personal history of other venous thrombosis and embolism: Secondary | ICD-10-CM

## 2020-06-12 HISTORY — DX: Personal history of other venous thrombosis and embolism: Z86.718

## 2020-06-26 ENCOUNTER — Ambulatory Visit (INDEPENDENT_AMBULATORY_CARE_PROVIDER_SITE_OTHER): Payer: 59 | Admitting: Gastroenterology

## 2020-06-26 ENCOUNTER — Other Ambulatory Visit: Payer: Self-pay

## 2020-06-26 ENCOUNTER — Encounter (INDEPENDENT_AMBULATORY_CARE_PROVIDER_SITE_OTHER): Payer: Self-pay | Admitting: Gastroenterology

## 2020-06-26 VITALS — BP 101/72 | HR 106 | Temp 98.9°F | Ht 67.0 in | Wt 192.1 lb

## 2020-06-26 DIAGNOSIS — R634 Abnormal weight loss: Secondary | ICD-10-CM | POA: Diagnosis not present

## 2020-06-26 DIAGNOSIS — K219 Gastro-esophageal reflux disease without esophagitis: Secondary | ICD-10-CM | POA: Diagnosis not present

## 2020-06-26 DIAGNOSIS — R110 Nausea: Secondary | ICD-10-CM

## 2020-06-26 DIAGNOSIS — K5000 Crohn's disease of small intestine without complications: Secondary | ICD-10-CM

## 2020-06-26 MED ORDER — ONDANSETRON 8 MG PO TBDP
8.0000 mg | ORAL_TABLET | Freq: Three times a day (TID) | ORAL | 1 refills | Status: DC | PRN
Start: 2020-06-26 — End: 2020-10-20

## 2020-06-26 NOTE — Patient Instructions (Signed)
Fasting labs  - zofran each morning scheduled with as needed doses during day.  -Vancomycin twice a day x 1 week, vance once a day x 1 week then off  -we will call w/ lab results

## 2020-06-26 NOTE — Progress Notes (Signed)
Patient profile: Elizabeth [name] is a 59 y.o. female seen for follow up.   History of Present Illness: Elizabeth [name] is seen today for nausea and diarrhea. Complex past medical history of small bowel Crohn's status post hemicolectomy at [facility] years ago followed by sigmoid colectomy for diverticulitis January 2016.  Last seen in clinic by Dr. Laural Golden June 2021-at that visit she was doing well on oral mesalamine.  Also followed for chronic GERD.  She reports starting vancomycin mid June after having C diff at diagnosed at Dr. Olena Heckle office - she reports her diarrhea improved w/ vanc. She has been on vanc 171m QID, stopped completely yesterday after 23 days of vanc. Did not taper vanc.Tried flagyl initially but unable to tolerate. She was also taking magnesium and stopped yesterday.  She is having 2 BM/day (loose-this is her baseline). No blood in stool. No abd pain currently. She reports last ABX course prior to Cdiff was Jan 2021.     She reports nausea that is severe since 05/20/20-she has lost #12 lbs. She repots not being able to eat due to nausea, also having dizziness. No vomiting. Nausea improves w/ trying to eat initially then worsens about 30 min after eating. Zofran and phenergan are not helping nausea. She tried stopping omeprazole but had increased GERD symptoms. No dysphagia. She reports around 6pm each night gets sweaty, dizzy, etc-this began few weeks ago when started vanc. She was given 2Tums for low calcium in ER. She reports labs last week showing mag level improved on 12079mBID and calcium was back in range.   Husband fell and she hurt her back trying to help him up, she reports being put on 6072mrednisone for back pain x2 weeks, finished 05/20/20, she did not pick up the medrol dose pack to taper. This seems to correlate to when severe nausea began.     BID x week Qd x week zofran on schedule in AM,   adrenocrotical insufficency        Wt Readings from Last 3  Encounters:  06/26/20 192 lb 1.6 oz (87.1 kg)  05/27/20 204 lb 12.8 oz (92.9 kg)  05/20/20 204 lb 12.8 oz (92.9 kg)     Last Colonoscopy:  Last Endoscopy:    Past Medical History:  Past Medical History:  Diagnosis Date  . Bronchitis   . Cough 01/28/2014   upper airway cough syndrome  . Crohn's disease ([place] . Diverticulitis   . Elevated transaminase level   . GERD (gastroesophageal reflux disease)   . Hypertension   . Obesity   . Pneumonia 1/15  . Tobacco abuse     Problem List: Patient Active Problem List   Diagnosis Date Noted  . Back spasm 05/20/2020  . Rectal pain 11/20/2019  . Hemorrhoids 11/20/2019  . Crohn's disease (HCCMahaska5/06/2019  . Acute bronchitis 11/29/2017  . 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 Strain of back 04/08/2017  . Diverticulitis large intestine 01/07/2015  . Diverticulitis 07/03/2014  . Hypokalemia 07/03/2014  . Tobacco abuse   . Obesity   . Upper airway cough syndrome 01/28/2014  . IBS (irritable bowel syndrome) 07/10/2013  . Acute bilateral lower abdominal pain 03/26/2013  . Tiredness 03/26/2013  . Sigmoid diverticulitis 09/12/2012  . Epigastric pain 05/22/2012  . Nausea without vomiting 01/28/2012  . Crohn's disease of ileum (HCCBaldwin1/28/2013  . GERD (gastroesophageal reflux disease) 01/10/2012  . HTN (hypertension) 01/10/2012    Past Surgical History: Past Surgical History:  Procedure Laterality Date  . ABDOMINAL HYSTERECTOMY  1980  . APPENDECTOMY    . COLONOSCOPY    . COLONOSCOPY N/A 08/16/2014   Procedure: COLONOSCOPY;  Surgeon: Rogene Houston, MD;  [facility];  Service: Endoscopy;  Laterality: N/A;  1200-rescheduled to 9/4 @ 10:45 Ann notified pt  . ESOPHAGOGASTRODUODENOSCOPY  06/16/2012   Procedure: ESOPHAGOGASTRODUODENOSCOPY (EGD);  Surgeon: Rogene Houston, MD;  [facility];  Service: Endoscopy;  Laterality: N/A;  10:30 AM  . hemocolectomy  2005   right  . LAPAROSCOPIC SIGMOID COLECTOMY N/A 01/07/2015    Procedure: LOW ANTERIOR COLON RESECTION;  Surgeon: Fanny Skates, MD;  Location: [place];  Service: General;  Laterality: N/A;  . SHOULDER ARTHROSCOPY WITH LABRAL REPAIR Right 01/20/2016   Procedure: SHOULDER ARTHROSCOPY WITH LABRAL REPAIR;  Surgeon: Meredith Pel, MD;  Location: [place];  Service: Orthopedics;  Laterality: Right;    Allergies: Allergies  Allergen Reactions  . Contrast Media [Iodinated Diagnostic Agents] Hives and Rash  . Penicillins Hives and Rash               Home Medications:  Current Outpatient Medications:  .  albuterol (VENTOLIN HFA) 108 (90 Base) MCG/ACT inhaler, SMARTSIG:2 Puff(s) By Mouth Every 4 Hours PRN, Disp: , Rfl:  .  dicyclomine (BENTYL) 10 MG capsule, TAKE 1 CAPSULE BY MOUTH THREE TIMES A DAY BEFORE MEALS, Disp: 270 capsule, Rfl: 1 .  mesalamine (PENTASA) 500 MG CR capsule, Take 1,000 mg by mouth 4 (four) times daily. , Disp: , Rfl:  .  omeprazole (PRILOSEC) 40 MG capsule, TAKE 1 CAPSULE BY MOUTH ONCE DAILY, Disp: 30 capsule, Rfl: 11 .  potassium chloride (MICRO-K) 10 MEQ CR capsule, Take 10 mEq by mouth 2 (two) times daily. , Disp: , Rfl:  .  promethazine (PHENERGAN) 25 MG tablet, Take 1 tablet (25 mg total) by mouth 2 (two) times daily as needed for nausea or vomiting., Disp: 30 tablet, Rfl: 0 .  valsartan-hydrochlorothiazide (DIOVAN-HCT) 160-12.5 MG per tablet, Take 1 tablet by mouth daily. , Disp: , Rfl:  .  diphenhydrAMINE (BENADRYL ALLERGY) 25 mg capsule, Take 2 capsules (50 mg total) by mouth once for 1 dose. Take 1 hour prior to study, Disp: 30 capsule, Rfl: 0 .  methylPREDNISolone (MEDROL DOSEPAK) 4 MG TBPK tablet, Use as directed. (Patient not taking: Reported on 06/26/2020), Disp: 21 tablet, Rfl: 0 .  ondansetron (ZOFRAN ODT) 8 MG disintegrating tablet, Take 1 tablet (8 mg total) by mouth every 8 (eight) hours as needed for nausea or vomiting., Disp: 30 tablet, Rfl: 1   Family History: family history includes Allergic Disorder in her brother;  Healthy in her daughter and mother; Lung cancer in her father.    Social History:   reports that she has been smoking cigarettes. She has a 20.00 pack-year smoking history. She has never used smokeless tobacco. She reports current alcohol use. She reports that she does not use drugs.   Review of Systems: Constitutional: Denies weight loss/weight gain  Eyes: No changes in vision. ENT: No oral lesions, sore throat.  GI: see HPI.  Heme/Lymph: No easy bruising.  CV: No chest pain.  GU: No hematuria.  Integumentary: No rashes.  Neuro: No headaches.  Psych: No depression/anxiety.  Endocrine: No heat/cold intolerance.  Allergic/Immunologic: No urticaria.  Resp: No cough, SOB.  Musculoskeletal: No joint swelling.    Physical Examination: BP 101/72   Pulse (!) 106   Temp 98.9 F (37.2 C) (Oral)  Ht _0  (1.702 m)   Wt 192 lb 1.6 oz (87.1 kg)   BMI 30.09 kg/m  Gen: NAD, alert and oriented x 4 HEENT: PEERLA, EOMI, Neck: supple, no JVD Chest: CTA bilaterally, no wheezes, crackles, or other adventitious sounds CV: RRR, no m/g/c/r Abd: soft, NT, ND, +BS in all four quadrants; no HSM, guarding, ridigity, or rebound tenderness Ext: no edema, well perfused with 2+ pulses, Skin: no rash or lesions noted on observed skin Lymph: no noted LAD  Data Reviewed:  06/05/20- Ca 5.7, albumin 2.6, Cr 1.4, AST 13  06/05/20-Prior ileocolectomy with anastomosis.  Mild bowel wall thickening of the distal ileum extending to  anastomosis, likely representing Crohn's disease.  Minimal distal colonic diverticulosis without evidence of  diverticulitis.  Small supraumbilical ventral hernia containing fat.  Aortic Atherosclerosis   Assessment/Plan: Ms. Kabler is a 59 y.o. female   1. C diff - given hx of underlying IBD will have her taper vanc to decrease relapse risk - finished 4x/day x 23 days, she will decrease to 2x/day x 1 week, 1x/day x 1 week then office. Diarrhea has returned to baseline. Will  recheck electrolytes given severe hypocalcemia and low mag in hospital   2. Nausea - associated w/ 12 lb weight loss over past 6 weeks, was on predisone without taper, check cortisol for adrenocortical insufficency, thyroid studies, etc. Has zofran and phenergan-will have her take scheduled zofran dose in day w/ additional doses prn. Recent CT in ER reviewed   3. Crohn's - due for colonoscopy when acute issues improve. Continue maintenance therapy for now. Check inflammatory markers   4. GERD - symptomatic when tried to decrease omeprazole, she will continue 1m once a day     GYukiwas seen today for follow-up.  Diagnoses and all orders for this visit:  Crohn's disease of ileum without complication (HCC) -     Cortisol -     COMPLETE METABOLIC PANEL WITH GFR -     C-reactive protein -     Sed Rate (ESR) -     CBC with Differential -     TSH + free T4  Nausea without vomiting -     Cortisol -     COMPLETE METABOLIC PANEL WITH GFR -     C-reactive protein -     Sed Rate (ESR) -     CBC with Differential -     TSH + free T4  Chronic GERD  Loss of weight  Other orders -     ondansetron (ZOFRAN ODT) 8 MG disintegrating tablet; Take 1 tablet (8 mg total) by mouth every 8 (eight) hours as needed for nausea or vomiting.   40 min total pt care. >50% face to face.   Case discussed w/ Dr RLaural Golden     I personally performed the service, non-incident to. (WP)  JLaurine Blazer PMitchell [facility] Hospitalfor [facility]astrointestinal [facility]

## 2020-06-28 LAB — TSH+FREE T4: TSH W/REFLEX TO FT4: 3.2 mIU/L (ref 0.40–4.50)

## 2020-06-28 LAB — CBC WITH DIFFERENTIAL/PLATELET
Absolute Monocytes: 582 cells/uL (ref 200–950)
Basophils Absolute: 68 cells/uL (ref 0–200)
Basophils Relative: 0.7 %
Eosinophils Absolute: 146 cells/uL (ref 15–500)
Eosinophils Relative: 1.5 %
HCT: 36.6 % (ref 35.0–45.0)
Hemoglobin: 12.6 g/dL (ref 11.7–15.5)
Lymphs Abs: 2280 cells/uL (ref 850–3900)
MCH: 33.3 pg — ABNORMAL HIGH (ref 27.0–33.0)
MCHC: 34.4 g/dL (ref 32.0–36.0)
MCV: 96.8 fL (ref 80.0–100.0)
MPV: 10.2 fL (ref 7.5–12.5)
Monocytes Relative: 6 %
Neutro Abs: 6625 cells/uL (ref 1500–7800)
Neutrophils Relative %: 68.3 %
Platelets: 430 10*3/uL — ABNORMAL HIGH (ref 140–400)
RBC: 3.78 10*6/uL — ABNORMAL LOW (ref 3.80–5.10)
RDW: 11.8 % (ref 11.0–15.0)
Total Lymphocyte: 23.5 %
WBC: 9.7 10*3/uL (ref 3.8–10.8)

## 2020-06-28 LAB — COMPLETE METABOLIC PANEL WITH GFR
AG Ratio: 1.4 (calc) (ref 1.0–2.5)
ALT: 16 U/L (ref 6–29)
AST: 14 U/L (ref 10–35)
Albumin: 3.6 g/dL (ref 3.6–5.1)
Alkaline phosphatase (APISO): 86 U/L (ref 37–153)
BUN/Creatinine Ratio: 19 (calc) (ref 6–22)
BUN: 21 mg/dL (ref 7–25)
CO2: 22 mmol/L (ref 20–32)
Calcium: 9.2 mg/dL (ref 8.6–10.4)
Chloride: 102 mmol/L (ref 98–110)
Creat: 1.12 mg/dL — ABNORMAL HIGH (ref 0.50–0.99)
GFR, Est African American: 61 mL/min/{1.73_m2} (ref >=60)
GFR, Est Non African American: 53 mL/min/{1.73_m2} — ABNORMAL LOW (ref >=60)
Globulin: 2.6 g/dL (calc) (ref 1.9–3.7)
Glucose, Bld: 87 mg/dL (ref 65–99)
Potassium: 4.1 mmol/L (ref 3.5–5.3)
Sodium: 137 mmol/L (ref 135–146)
Total Bilirubin: 0.5 mg/dL (ref 0.2–1.2)
Total Protein: 6.2 g/dL (ref 6.1–8.1)

## 2020-06-28 LAB — CORTISOL: Cortisol, Plasma: 6.4 ug/dL

## 2020-06-28 LAB — SEDIMENTATION RATE: Sed Rate: 36 mm/h — ABNORMAL HIGH (ref 0–30)

## 2020-06-28 LAB — C-REACTIVE PROTEIN: CRP: 19.4 mg/L — ABNORMAL HIGH (ref <8.0)

## 2020-07-02 ENCOUNTER — Other Ambulatory Visit (INDEPENDENT_AMBULATORY_CARE_PROVIDER_SITE_OTHER): Payer: Self-pay | Admitting: Gastroenterology

## 2020-07-02 DIAGNOSIS — K5000 Crohn's disease of small intestine without complications: Secondary | ICD-10-CM

## 2020-07-07 ENCOUNTER — Other Ambulatory Visit: Payer: Self-pay | Admitting: Family Medicine

## 2020-07-07 DIAGNOSIS — M79662 Pain in left lower leg: Secondary | ICD-10-CM

## 2020-07-08 ENCOUNTER — Ambulatory Visit (HOSPITAL_COMMUNITY)
Admission: RE | Admit: 2020-07-08 | Discharge: 2020-07-08 | Disposition: A | Discharge status: Home / Self Care | Payer: 59 | Source: Ambulatory Visit | Attending: Family Medicine | Admitting: Family Medicine

## 2020-07-08 ENCOUNTER — Other Ambulatory Visit: Payer: Self-pay

## 2020-07-08 DIAGNOSIS — M79662 Pain in left lower leg: Secondary | ICD-10-CM | POA: Diagnosis present

## 2020-07-08 DIAGNOSIS — M7989 Other specified soft tissue disorders: Secondary | ICD-10-CM | POA: Diagnosis present

## 2020-07-14 ENCOUNTER — Ambulatory Visit (HOSPITAL_COMMUNITY): Payer: 59

## 2020-07-14 ENCOUNTER — Telehealth (INDEPENDENT_AMBULATORY_CARE_PROVIDER_SITE_OTHER): Payer: Self-pay | Admitting: *Deleted

## 2020-07-14 MED ORDER — DIFICID 200 MG PO TABS: 200.0000 mg | ORAL_TABLET | Freq: Two times a day (BID) | ORAL | 0 refills | Status: DC

## 2020-07-14 NOTE — Telephone Encounter (Signed)
Talked with the patient. She states that she saw Dr.Rehman . He cut her down to Vancomycin 2 a day for 1 week , then 1 a day for 1 week. She noticed a change while taking the 1 a day.  Right after finishing that week. Her symptoms started up again. She states that she has gone to the bathroom 20 times already today,and probably the same about of time yesterday.  Her 61 yo mother is having major back surgery early tomorrow morning, Keonia will be with her until she is discharged and then provide care for her Mom. Cannot come in for a OV today or tomorrow because of this.  She ask that we call her home phone now - [phone].

## 2020-07-14 NOTE — Telephone Encounter (Signed)
A message was left on the patient's voicemail asking these questions.  We ask that she call us back asap to let us know the answers.

## 2020-07-14 NOTE — Telephone Encounter (Signed)
Case discussed w/ Dr Laural Golden - will start Dificid.  I called patient with recommendations.  She will notify me if she has any issues getting the medication.

## 2020-07-14 NOTE — Addendum Note (Signed)
Addended by: Laurine Blazer A on: 07/14/2020 04:14 PM   Modules accepted: Orders

## 2020-07-14 NOTE — Telephone Encounter (Signed)
Patient left a message on my voicemail yesterday (Sunday) at 4:55?  She states that she had been on Vancomycin and finished a round. The diarrhea , and a sweet smell is back. She is asking for a refill on Vancomycin or something. Her Mother is getting ready to have major surgery Tuesday and she has to provide her care.  Her call back number is [phone].

## 2020-07-14 NOTE — Telephone Encounter (Signed)
I called patient at both numbers in chart - LMOM on both numbers. If she calls back please find out how often she is having diarrhea and when it returned.

## 2020-07-15 ENCOUNTER — Telehealth (INDEPENDENT_AMBULATORY_CARE_PROVIDER_SITE_OTHER): Payer: Self-pay | Admitting: *Deleted

## 2020-07-15 NOTE — Telephone Encounter (Signed)
Due to the cost of the Dificid out of pocket and the patient's need to have something now , Dr.Rehman ask that I call in the Vancomycin 125 mg. Patient will take 1 by mouth four times daily for 2 weeks and then a refill was given for patient to get refilled. She is to call us on day 13 for a new schedule on how to take the medication.  Tried calling the patient on her cell phone and it went straight to voicemail. I called her house phone and left a detailed message for her husband.  Note that at the time of the call in to the [facility], I was told that the computers were all down system wide and that it may be tomorrow morning before it may be filled.  In my message to husband if we need to call this in to another pharmacy to please let us know.

## 2020-07-17 ENCOUNTER — Encounter (INDEPENDENT_AMBULATORY_CARE_PROVIDER_SITE_OTHER): Payer: Self-pay | Admitting: *Deleted

## 2020-07-29 ENCOUNTER — Telehealth (INDEPENDENT_AMBULATORY_CARE_PROVIDER_SITE_OTHER): Payer: Self-pay | Admitting: Internal Medicine

## 2020-07-29 NOTE — Telephone Encounter (Signed)
Due to the cost of the Dificid out of pocket and the patient's need to have something now , Dr.Rehman ask that I call in the Vancomycin 125 mg. Patient will take 1 by mouth four times daily for 2 weeks and then a refill was given for patient to get refilled. She is to call us on day 13 for a new schedule on how to take the medication.   Tried calling the patient on her cell phone and it went straight to voicemail. I called her house phone and left a detailed message for her husband.   Note that at the time of the call in to the [facility], I was told that the computers were all down system wide and that it may be tomorrow morning before it may be filled.   In my message to husband if we need to call this in to another pharmacy to please let us know.       Patient was made aware of the above two different times. Patient is being called.

## 2020-07-29 NOTE — Telephone Encounter (Signed)
Patient was made aware , and Dr.Rehman is asking that the patient be seen by him week after next by him. Patient will be called and given that appointment. Example August 31 st.

## 2020-07-29 NOTE — Telephone Encounter (Signed)
Patient called and she states that she is still taking the first refill that was done on 07/16/2020. She thinks she missed calling in on the 13 th day. She has 21/2 days of this refill to take.  Diarrhea is better some days that others , mainly loose but there is no odor now.  As noted the patient was to call on day 13 with progress report and get instruction on how to take the remaining medication. To discuss with Dr.Rehman.

## 2020-07-29 NOTE — Telephone Encounter (Signed)
Per Dr.Rehman patient is to take Vancomycin (her refill)  take 2 by mouth daily. For 10 days. She should be taking a probiotic daily, she is to be keeping a stool diary , the frequency, and the consistency. Patient will be called and made aware. Copied to [employer] PA-C as a FYI.

## 2020-07-29 NOTE — Telephone Encounter (Signed)
Patient left message regarding her medication - please advise - [phone]

## 2020-07-30 ENCOUNTER — Telehealth (INDEPENDENT_AMBULATORY_CARE_PROVIDER_SITE_OTHER): Payer: Self-pay | Admitting: *Deleted

## 2020-07-30 NOTE — Telephone Encounter (Signed)
Rec'd approval from the patient Assistance Program Bernita Buffy) Patient has been approved for help through 07-28-21. Patient was called and a message was left letting her know that she will be getting a shipment within the next few days from their pharmacy distributor , [facility] [place].

## 2020-08-07 ENCOUNTER — Ambulatory Visit (INDEPENDENT_AMBULATORY_CARE_PROVIDER_SITE_OTHER): Payer: 59 | Admitting: Internal Medicine

## 2020-08-07 ENCOUNTER — Encounter (INDEPENDENT_AMBULATORY_CARE_PROVIDER_SITE_OTHER): Payer: Self-pay | Admitting: Internal Medicine

## 2020-08-07 ENCOUNTER — Other Ambulatory Visit: Payer: Self-pay

## 2020-08-07 VITALS — BP 113/72 | HR 97 | Temp 99.0°F | Ht 67.0 in | Wt 198.9 lb

## 2020-08-07 DIAGNOSIS — K5000 Crohn's disease of small intestine without complications: Secondary | ICD-10-CM

## 2020-08-07 DIAGNOSIS — A0472 Enterocolitis due to Clostridium difficile, not specified as recurrent: Secondary | ICD-10-CM

## 2020-08-07 MED ORDER — CHOLESTYRAMINE 4 GM/DOSE PO POWD: 4.0000 g | Freq: Two times a day (BID) | ORAL | 0 refills | Status: DC

## 2020-08-07 MED ORDER — LOPERAMIDE HCL 2 MG PO CAPS: 2.0000 mg | ORAL_CAPSULE | Freq: Every day | ORAL | Status: DC

## 2020-08-07 NOTE — Progress Notes (Signed)
Presenting complaint;  Recurrent diarrhea in a patient with history of C. difficile colitis. History of small bowel Crohn's disease.  Database and subjective:  Patient is 59 year old Caucasian female with history of ileocecal Crohn's disease diagnosed in 2004 requiring right hemicolectomy at [facility] in 2005, history of sigmoid diverticulitis leading to sigmoid colon resection by Dr. Fanny Skates in January 01, 2015 who was noted to be in remission at the time of surgery.  She also has a history of IBS and has been maintained mesalamine.  She developed C. difficile diarrhea back in January 2021 resulting from 2 courses of doxycycline that she received for her stye.  She was treated with oral vancomycin for 10 days with for recovery. Patient developed diarrhea the night of her office visit with me on 05/20/2020.  He was seen by Dr. Gar Ponto.  She was diagnosed with C. difficile colitis.  She was given metronidazole which she did not tolerate.  He was treated with vancomycin for 10 days and every time she finished therapy her diarrhea relapse and she was treated 3 times. Patient was seen in our office on 06/26/2020 by Ms. Laurine Blazer, PA-C.  Dificid was recommended but cost was going to be $5000.  Therefore she was asked to go back on vancomycin and we decided to taper instead of stopping it after 2 weeks. States she was doing well while on vancomycin 4 times a day.  She tapered dose to twice daily 12/20/2017 2021.  He has noted increase in stool frequency.  Yesterday she had 9 stools and today she has had 13 stools.  They are greenish loose stools and small volume.  She has noted abdominal rumbling but no cramping.  She denies melena rectal bleeding nausea vomiting fever or chills. She has lost 12 pounds prior to her last office visit of 06/26/2020.  He has gained 6 pounds back.  She says her appetite is good.  She is worried that her symptoms will worsen and she will start feeling poorly  again.  She is hoping that she will feel well enough to go to the beach this weekend. Patient has not taken any other antibiotics that she took doxycycline in December last year.  Current Medications: Outpatient Encounter Medications as of 08/07/2020  Medication Sig  . albuterol (VENTOLIN HFA) 108 (90 Base) MCG/ACT inhaler SMARTSIG:2 Puff(s) By Mouth Every 4 Hours PRN  . dicyclomine (BENTYL) 10 MG capsule TAKE 1 CAPSULE BY MOUTH THREE TIMES A DAY BEFORE MEALS  . mesalamine (PENTASA) 500 MG CR capsule Take 1,000 mg by mouth 4 (four) times daily.   Marland Kitchen omeprazole (PRILOSEC) 40 MG capsule TAKE 1 CAPSULE BY MOUTH ONCE DAILY  . ondansetron (ZOFRAN ODT) 8 MG disintegrating tablet Take 1 tablet (8 mg total) by mouth every 8 (eight) hours as needed for nausea or vomiting.  . potassium chloride (MICRO-K) 10 MEQ CR capsule Take 10 mEq by mouth 2 (two) times daily.   . Probiotic Product (PROBIOTIC PO) Take by mouth daily.  . rivaroxaban (XARELTO) 20 MG TABS tablet Take 20 mg by mouth daily with supper.  . valsartan-hydrochlorothiazide (DIOVAN-HCT) 160-12.5 MG per tablet Take 1 tablet by mouth daily.   . vancomycin (VANCOCIN) 125 MG capsule Take 125 mg by mouth 4 (four) times daily.  . diphenhydrAMINE (BENADRYL ALLERGY) 25 mg capsule Take 2 capsules (50 mg total) by mouth once for 1 dose. Take 1 hour prior to study  . fidaxomicin (DIFICID) 200 MG TABS tablet Take 1  tablet (200 mg total) by mouth 2 (two) times daily. (Patient not taking: Reported on 08/07/2020)  . promethazine (PHENERGAN) 25 MG tablet Take 1 tablet (25 mg total) by mouth 2 (two) times daily as needed for nausea or vomiting. (Patient not taking: Reported on 08/07/2020)  . [DISCONTINUED] methylPREDNISolone (MEDROL DOSEPAK) 4 MG TBPK tablet Use as directed. (Patient not taking: Reported on 06/26/2020)   No facility-administered encounter medications on file as of 08/07/2020.     Objective: Blood pressure 113/72, pulse 97, temperature 99 F (37.2  C), temperature source Oral, height 5' 7"  (1.702 m), weight 198 lb 14.4 oz (90.2 kg). Patient is alert and in no acute distress. She is wearing a mask. Conjunctiva is pink. Sclera is nonicteric Oropharyngeal mucosa is normal. No neck masses or thyromegaly noted. Cardiac exam with regular rhythm normal S1 and S2. No murmur or gallop noted. Lungs are clear to auscultation. Abdomen is full and symmetrical.  She has lower midline scar.  Bowel sounds are hyperactive.  On palpation abdomen is soft and nontender with organomegaly or masses. No LE edema or clubbing noted. 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   Assessment:  #1.  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  She had first episode in January this year and responded to 10 days of vancomycin.  She has used vancomycin on at least 4 different occasions but she keeps having relapse.  Underlying inflammatory bowel disease may be making her condition somewhat difficult to treat.  We have recommended Dificid but co-pay was too high.  Will make another attempt to secure this medication for her. He does not appear to be toxic.  Needs to confirm that she still has C. difficile and his diarrhea is not due to other reasons.  If she fails Dificid she may need FMT.  #2.  Small bowel Crohn's disease.  Status post right hemicolectomy 16 years ago.  Last colonoscopy was in July 2015.  Dr. Fanny Skates examined her small bowel at the time of sigmoid colon resection for diverticulitis and noted her to be in remission.   Plan:  Continue vancomycin at current dose of 125 mg p.o. twice daily. Loperamide OTC 2 mg by mouth daily with breakfast. Patient will go to the lab for stool C. difficile testing by PCR. Cholestyramine 4 g by mouth twice daily.  Patient advised not to take this medication for 2 hours before or after taking other medications. Will contact pharmaceutical rep regarding Dificid prescription. Office visit in 3  months.

## 2020-08-07 NOTE — Patient Instructions (Signed)
Continue vancomycin at current dose. Cholestyramine/Questran 2 hours before or after taking other medications Imodium OTC 2 mg by mouth daily with breakfast Patient will call back results of stool test and further recommendations.

## 2020-08-10 LAB — CLOSTRIDIUM DIFFICILE BY PCR: Toxigenic C. Difficile by PCR: NEGATIVE

## 2020-08-30 ENCOUNTER — Other Ambulatory Visit (INDEPENDENT_AMBULATORY_CARE_PROVIDER_SITE_OTHER): Payer: Self-pay | Admitting: Internal Medicine

## 2020-09-02 ENCOUNTER — Encounter (INDEPENDENT_AMBULATORY_CARE_PROVIDER_SITE_OTHER): Payer: Self-pay | Admitting: Internal Medicine

## 2020-09-02 ENCOUNTER — Other Ambulatory Visit: Payer: Self-pay

## 2020-09-02 ENCOUNTER — Ambulatory Visit (INDEPENDENT_AMBULATORY_CARE_PROVIDER_SITE_OTHER): Payer: 59 | Admitting: Internal Medicine

## 2020-09-02 VITALS — BP 129/86 | HR 76 | Temp 96.8°F | Ht 67.0 in | Wt 198.1 lb

## 2020-09-02 DIAGNOSIS — R197 Diarrhea, unspecified: Secondary | ICD-10-CM

## 2020-09-02 DIAGNOSIS — K5000 Crohn's disease of small intestine without complications: Secondary | ICD-10-CM | POA: Diagnosis not present

## 2020-09-02 DIAGNOSIS — K580 Irritable bowel syndrome with diarrhea: Secondary | ICD-10-CM

## 2020-09-02 NOTE — Progress Notes (Signed)
Presenting complaint;  History of Crohn's disease. Recent bout with C. difficile. Diarrhea has recurred.  Database and subjective:  Patient is 59 year old Caucasian female who has history of ileocecal Crohn's disease which was diagnosed in 2004 not responding therapy and she underwent right hemicolectomy at [facility] in in 2005.  She also has a history of sigmoid diverticulitis and underwent sigmoid resection in January 2016 when she was noted to be in remission as far as Crohn's disease is concerned.  She also has history of IBS and has been maintained on mesalamine. She was treated for C. difficile in January 2021 and developed again in July this year.  She was treated with vancomycin on 2 occasions but diarrhea relapse.  Second course was more than 2 weeks. She was seen in the office on 08/07/2020 and stool studies were negative.  Patient was advised to take dicyclomine and Imodium on schedule.  She now returns stating that diarrhea is back.  She says she had formed stool for 2 or 3 days when she finished vancomycin.  Now she is having 10-15 stools per day.  Stool volume is small and stool is liquid.  No melena or rectal bleeding.  She denies fever chills nausea or vomiting.  She wakes up around 4:00 every morning in order to have a bowel movement.  He has not lost any weight since her last visit.  She is taking Imodium on as-needed basis.  On most days she does not take more than 1 dose.  She is afraid that she may become constipated. She says heartburn is well controlled with therapy.  Current Medications: Outpatient Encounter Medications as of 09/02/2020  Medication Sig  . albuterol (VENTOLIN HFA) 108 (90 Base) MCG/ACT inhaler SMARTSIG:2 Puff(s) By Mouth Every 4 Hours PRN  . dicyclomine (BENTYL) 10 MG capsule TAKE 1 CAPSULE BY MOUTH THREE TIMES A DAY BEFORE MEALS  . loperamide (IMODIUM) 2 MG capsule Take 1 capsule (2 mg total) by mouth daily with breakfast.  . mesalamine (PENTASA) 500 MG CR  capsule Take 1,000 mg by mouth 4 (four) times daily.   Marland Kitchen omeprazole (PRILOSEC) 40 MG capsule TAKE 1 CAPSULE BY MOUTH ONCE DAILY  . ondansetron (ZOFRAN ODT) 8 MG disintegrating tablet Take 1 tablet (8 mg total) by mouth every 8 (eight) hours as needed for nausea or vomiting.  . potassium chloride (MICRO-K) 10 MEQ CR capsule Take 10 mEq by mouth 2 (two) times daily.   . Probiotic Product (PROBIOTIC PO) Take by mouth daily.  . promethazine (PHENERGAN) 25 MG tablet Take 1 tablet (25 mg total) by mouth 2 (two) times daily as needed for nausea or vomiting.  . rivaroxaban (XARELTO) 20 MG TABS tablet Take 20 mg by mouth daily with supper.  . valsartan-hydrochlorothiazide (DIOVAN-HCT) 160-12.5 MG per tablet Take 1 tablet by mouth daily.   . cholestyramine (QUESTRAN) 4 GM/DOSE powder TAKE 4 GRAMS MIXED IN JUICE OR WATER TWICE DAILY WITH MEALS (Patient not taking: Reported on 09/02/2020)  . fidaxomicin (DIFICID) 200 MG TABS tablet Take 1 tablet (200 mg total) by mouth 2 (two) times daily. (Patient not taking: Reported on 08/07/2020)  . [DISCONTINUED] diphenhydrAMINE (BENADRYL ALLERGY) 25 mg capsule Take 2 capsules (50 mg total) by mouth once for 1 dose. Take 1 hour prior to study  . [DISCONTINUED] vancomycin (VANCOCIN) 125 MG capsule Take 125 mg by mouth 4 (four) times daily. (Patient not taking: Reported on 09/02/2020)   No facility-administered encounter medications on file as of 09/02/2020.  Objective: Blood pressure 129/86, pulse 76, temperature (!) 96.8 F (36 C), temperature source Oral, height 5' 7"  (1.702 m), weight 198 lb 1.6 oz (89.9 kg). Patient is alert and in no acute distress. She is wearing a mask. Conjunctiva is pink. Sclera is nonicteric Oropharyngeal mucosa is normal. No neck masses or thyromegaly noted. Cardiac exam with regular rhythm normal S1 and S2. No murmur or gallop noted. Lungs are clear to auscultation. Abdomen is full.  She has low midline scar.  Bowel sounds are  hyperactive.  On palpation abdomen is soft and nontender with no organomegaly or masses. No LE edema or clubbing noted.  Distal phalanx of left fourth finger has been amputated.  Labs/studies Results:  CBC Latest Ref Rng & Units 06/27/2020 05/27/2020 12/18/2019  WBC 3.8 - 10.8 Thousand/uL 9.7 13.8(H) 10.3  Hemoglobin 11.7 - 15.5 g/dL 12.6 15.3(H) 16.1(H)  Hematocrit 35 - 45 % 36.6 42.9 44.8  Platelets 140 - 400 Thousand/uL 430(H) 274 276    CMP Latest Ref Rng & Units 06/27/2020 05/27/2020 12/18/2019  Glucose 65 - 99 mg/dL 87 110(H) 101  BUN 7 - 25 mg/dL 21 43(H) 14  Creatinine 0.50 - 0.99 mg/dL 1.12(H) 2.58(H) 0.88  Sodium 135 - 146 mmol/L 137 133(L) 140  Potassium 3.5 - 5.3 mmol/L 4.1 3.2(L) 3.2(L)  Chloride 98 - 110 mmol/L 102 101 103  CO2 20 - 32 mmol/L 22 19(L) 27  Calcium 8.6 - 10.4 mg/dL 9.2 7.8(L) 8.7  Total Protein 6.1 - 8.1 g/dL 6.2 6.5 6.1  Total Bilirubin 0.2 - 1.2 mg/dL 0.5 1.0 0.5  Alkaline Phos 38 - 126 U/L - 74 -  AST 10 - 35 U/L 14 19 19   ALT 6 - 29 U/L 16 26 16     Hepatic Function Latest Ref Rng & Units 06/27/2020 05/27/2020 12/18/2019  Total Protein 6.1 - 8.1 g/dL 6.2 6.5 6.1  Albumin 3.5 - 5.0 g/dL - 3.3(L) -  AST 10 - 35 U/L 14 19 19   ALT 6 - 29 U/L 16 26 16   Alk Phosphatase 38 - 126 U/L - 74 -  Total Bilirubin 0.2 - 1.2 mg/dL 0.5 1.0 0.5  Bilirubin, Direct 0.0 - 0.3 mg/dL - - -    Lab Results  Component Value Date   CRP 19.4 (H) 06/27/2020      Assessment:  #1.  Recurrent diarrhea.  She was diagnosed and treated for C. difficile colitis and July this year.  She responded to 2-week course of vancomycin and she subsequently was treated for longer duration.  Stool study back on her visit of 08/07/2020 was negative.  I suspect she has postinfectious flareup of her IBS.  She does not appear to be toxic.  She certainly could have a flareup of her inflammatory bowel disease as well.  #2.  History of small bowel Crohn's disease.  Presently maintained on oral  mesalamine.  #3.  Chronic GERD.  She is doing well with therapy.   Plan:  Patient advised to take dicyclomine 10 mg by mouth 30 minutes before each meal. Loperamide OTC 2 mg by mouth 30 minutes before each meal for 2 to 3 days and thereafter twice daily. Gummy fiber 2 g p.o. twice daily. Patient will keep stool diary and call with progress report in 2 weeks. If she is not feeling any better we will proceed with diagnostic colonoscopy. Office visit in 3 months.

## 2020-09-02 NOTE — Patient Instructions (Signed)
Take gummy fibers 2 g by mouth twice daily. Take dicyclomine 10 mg before each meal for 2 to 3 days and thereafter twice daily. Take Imodium OTC 2 mg before each meal for 2 to 3 days and thereafter twice daily. Will request copy of recent blood work from Dr. Olena Heckle office. Progress report in 2 weeks. If diarrhea persists will proceed with colonoscopy.

## 2020-10-13 ENCOUNTER — Other Ambulatory Visit (INDEPENDENT_AMBULATORY_CARE_PROVIDER_SITE_OTHER): Payer: Self-pay | Admitting: Gastroenterology

## 2020-10-13 NOTE — Telephone Encounter (Signed)
Last seen 09/02/2020 for Crohns by Dr. Laural Golden

## 2020-10-20 ENCOUNTER — Other Ambulatory Visit (INDEPENDENT_AMBULATORY_CARE_PROVIDER_SITE_OTHER): Payer: Self-pay | Admitting: Gastroenterology

## 2020-10-20 MED ORDER — ONDANSETRON 8 MG PO TBDP: 8.0000 mg | ORAL_TABLET | Freq: Three times a day (TID) | ORAL | 1 refills | Status: DC | PRN

## 2020-10-20 NOTE — Progress Notes (Signed)
Refill request for zofran received from pharmacy. Refill sent.

## 2020-10-23 ENCOUNTER — Ambulatory Visit (INDEPENDENT_AMBULATORY_CARE_PROVIDER_SITE_OTHER): Payer: 59 | Admitting: Gastroenterology

## 2020-10-23 ENCOUNTER — Other Ambulatory Visit: Payer: Self-pay

## 2020-10-23 ENCOUNTER — Encounter (INDEPENDENT_AMBULATORY_CARE_PROVIDER_SITE_OTHER): Payer: Self-pay | Admitting: Gastroenterology

## 2020-10-23 VITALS — BP 114/77 | HR 101 | Temp 98.3°F | Ht 67.0 in | Wt 202.3 lb

## 2020-10-23 DIAGNOSIS — K50018 Crohn's disease of small intestine with other complication: Secondary | ICD-10-CM | POA: Diagnosis not present

## 2020-10-23 DIAGNOSIS — K5000 Crohn's disease of small intestine without complications: Secondary | ICD-10-CM | POA: Diagnosis not present

## 2020-10-23 DIAGNOSIS — R197 Diarrhea, unspecified: Secondary | ICD-10-CM

## 2020-10-23 NOTE — Patient Instructions (Signed)
Perform blood workup Perform stool workup Schedule MRI enterography with IV contrast Continue Bentyl and Imodium for symptom control

## 2020-10-23 NOTE — Progress Notes (Signed)
Elizabeth [name], M.D. Gastroenterology & Hepatology [facility] [address] [place], [place] [address]  Primary Care Physician: Caryl Bis, [place] [id number]  I will communicate my assessment and recommendations to the referring MD via EMR. "Note: Occasional unusual wording and randomly placed punctuation marks may result from the use of speech recognition technology to transcribe this document"  Problems: 1. Ileocolonic Crohn's disease status post resection in 2005 2. Diverticulosis status post resection in 2016  History of Present Illness: Elizabeth [name] is a 59 y.o. female with past medical history of ileocolonic Crohn's disease status post resection due to stricture, diverticulosis status post resection of the sigmoid, history of C. Difficile x2, GERD, hypertension, obesity, who presents for follow-up of her diarrhea.  Patient reports that she has presented persistent episodes of watery bowel movements, up to 10-12 times a day in spite of the fact she is taking Pentasa, dicyclomine and fiber Gummies. She denies having any significant abdominal pain but reports that the diarrhea is causing her significant discomfort. She denies having any blood in her stool. She takes Imodium three times a day but this does not help at all with diarrhea. The patient states she has some nausea but denies  vomiting, fever, chills, hematochezia, melena, hematemesis, abdominal distention, jaundice, pruritus or weight loss. She is very concerned as she is having accidents of diarrhea even during the night.  Notably, her last last colonoscopy was performed after her last resection of her sigmoid. She has not had a repeat colonoscopy since then as she is afraid she will have "a perforation like one of her friends". She works as a Marine scientist.  Of note, the patient had 2 episode of C. difficile in the past. She reports having one episode in January  which was treated with vancomycin taper. Somewhere around May she had a repeat episode of C. difficile (no stool testing is available in her records). The patient states that she took another vancomycin course as she had an negative stool testing in August 2021.  Patient denies any extraintestinal manifestations.  Colonoscopy: 2015 Prep excellent. Focal erythema noted to mucosa at ileocolonic anastomosis. Small bowel not be deeply intubated. 6 mm polyp cold snared from descending colon. Scattered diverticula at sigmoid colon which was relatively fixed in noncompliant but no evidence of ulcers to suggest colonic disease. Normal rectal mucosa. Hemorrhoids above the dentate line.  Past Medical History: Past Medical History:  Diagnosis Date  . Bronchitis   . C. difficile diarrhea   . Cough 01/28/2014   upper airway cough syndrome  . Crohn's disease ([place])   . Diverticulitis   . Elevated transaminase level   . GERD (gastroesophageal reflux disease)   . Hypertension   . Obesity   . Pneumonia 1/15  . Tobacco abuse     Past Surgical History: Past Surgical History:  Procedure Laterality Date  . ABDOMINAL HYSTERECTOMY  1980  . APPENDECTOMY    . COLONOSCOPY    . COLONOSCOPY N/A 08/16/2014   Procedure: COLONOSCOPY;  Surgeon: Rogene Houston, MD;  [facility];  Service: Endoscopy;  Laterality: N/A;  1200-rescheduled to 9/4 @ 10:45 Ann notified pt  . ESOPHAGOGASTRODUODENOSCOPY  06/16/2012   Procedure: ESOPHAGOGASTRODUODENOSCOPY (EGD);  Surgeon: Rogene Houston, MD;  Location: [facility];  Service: Endoscopy;  Laterality: N/A;  10:30 AM  . hemocolectomy  2005   right  . LAPAROSCOPIC SIGMOID COLECTOMY N/A 01/07/2015  Procedure: LOW ANTERIOR COLON RESECTION;  Surgeon: Fanny Skates, MD;  Location: [place];  Service: General;  Laterality: N/A;  . SHOULDER ARTHROSCOPY WITH LABRAL REPAIR Right 01/20/2016   Procedure: SHOULDER ARTHROSCOPY WITH LABRAL REPAIR;  Surgeon: Meredith Pel, MD;  Location: [place];  Service: Orthopedics;  Laterality: Right;    Family History: Family History  Problem Relation Age of Onset  . Healthy Mother   . Lung cancer Father        smoked  . Allergic Disorder Brother   . Healthy Daughter     Social History: Social History   Tobacco Use  Smoking Status Current Every Day Smoker  . Packs/day: 0.50  . Years: 40.00  . Pack years: 20.00  . Types: Cigarettes  Smokeless Tobacco Never Used  Tobacco Comment   increase smoking , very nervous   Social History   Substance and Sexual Activity  Alcohol Use Not Currently  . Alcohol/week: 0.0 standard drinks   Comment: occ   Social History   Substance and Sexual Activity  Drug Use No    Allergies: Allergies  Allergen Reactions  . Contrast Media [Iodinated Diagnostic Agents] Hives and Rash  . Penicillins Hives and Rash             Medications: Current Outpatient Medications  Medication Sig Dispense Refill  . albuterol (VENTOLIN HFA) 108 (90 Base) MCG/ACT inhaler SMARTSIG:2 Puff(s) By Mouth Every 4 Hours PRN    . dicyclomine (BENTYL) 10 MG capsule TAKE 1 CAPSULE BY MOUTH THREE TIMES A DAY BEFORE MEALS 270 capsule 1  . loperamide (IMODIUM) 2 MG capsule Take 1 capsule (2 mg total) by mouth daily with breakfast.    . mesalamine (PENTASA) 500 MG CR capsule Take 1,000 mg by mouth 4 (four) times daily.     Marland Kitchen omeprazole (PRILOSEC) 40 MG capsule TAKE 1 CAPSULE BY MOUTH ONCE DAILY 30 capsule 11  . ondansetron (ZOFRAN ODT) 8 MG disintegrating tablet Take 1 tablet (8 mg total) by mouth every 8 (eight) hours as needed for nausea or vomiting. 30 tablet 1  . potassium chloride (MICRO-K) 10 MEQ CR capsule Take 10 mEq by mouth 2 (two) times daily.     . Probiotic Product (PROBIOTIC PO) Take by mouth daily.    . promethazine (PHENERGAN) 25 MG tablet Take 1 tablet (25 mg total) by mouth 2 (two) times daily as needed for nausea or vomiting. 30 tablet 0  . rivaroxaban (XARELTO) 20 MG TABS  tablet Take 20 mg by mouth daily with supper.    . valsartan-hydrochlorothiazide (DIOVAN-HCT) 160-12.5 MG per tablet Take 1 tablet by mouth daily.     . cholestyramine (QUESTRAN) 4 GM/DOSE powder TAKE 4 GRAMS MIXED IN JUICE OR WATER TWICE DAILY WITH MEALS (Patient not taking: Reported on 10/23/2020) 378 g 0  . fidaxomicin (DIFICID) 200 MG TABS tablet Take 1 tablet (200 mg total) by mouth 2 (two) times daily. (Patient not taking: Reported on 08/07/2020) 20 tablet 0   No current facility-administered medications for this visit.    Review of Systems: GENERAL: negative for malaise, night sweats HEENT: No changes in hearing or vision, no nose bleeds or other nasal problems. NECK: Negative for lumps, goiter, pain and significant neck swelling RESPIRATORY: Negative for cough, wheezing CARDIOVASCULAR: Negative for chest pain, leg swelling, palpitations, orthopnea GI: SEE HPI MUSCULOSKELETAL: Negative for joint pain or swelling, back pain, and muscle pain. SKIN: Negative for lesions, rash PSYCH: Negative for sleep disturbance, mood disorder and recent  psychosocial stressors. HEMATOLOGY Negative for prolonged bleeding, bruising easily, and swollen nodes. ENDOCRINE: Negative for cold or heat intolerance, polyuria, polydipsia and goiter. NEURO: negative for tremor, gait imbalance, syncope and seizures. The remainder of the review of systems is noncontributory.   Physical Exam: BP 114/77 (BP Location: Right Arm, Patient Position: Sitting, Cuff Size: Large)   Pulse (!) 101   Temp 98.3 F (36.8 C) (Oral)   Ht 5' 7"  (1.702 m)   Wt 202 lb 4.8 oz (91.8 kg)   BMI 31.68 kg/m  GENERAL: The patient is AO x3, in no acute distress. HEENT: Head is normocephalic and atraumatic. EOMI are intact. Mouth is well hydrated and without lesions. NECK: Supple. No masses LUNGS: Clear to auscultation. No presence of rhonchi/wheezing/rales. Adequate chest expansion HEART: RRR, normal s1 and s2. ABDOMEN: Soft,  nontender, no guarding, no peritoneal signs, and nondistended. BS +. No masses. Has some abdominal scars. EXTREMITIES: Without any cyanosis, clubbing, rash, lesions or edema. NEUROLOGIC: AOx3, no focal motor deficit. SKIN: no jaundice, no rashes  Imaging/Labs: as above  I personally reviewed and interpreted the available labs, imaging and endoscopic files.  Impression and Plan: CAILAN ANTONUCCI is a 59 y.o. female with past medical history of ileocolonic Crohn's disease status post resection due to stricture, diverticulosis status post resection of the sigmoid, history of C. Difficile x2, GERD, hypertension, obesity, who presents for follow-up of her diarrhea. The patient has presented significant amount of bowel movements and severe lifestyle impairment due to these episodes. She has tried taking Lomotil and medications for possible IBS but I do not consider this is the reason why she is having her ongoing diarrhea. I considered the reason of her symptoms is likely organic in nature, and most likely related to her Crohn's disease as she had evidence of some disease activity in her last colonoscopy. The patient is extremely hesitant to undergo at endoscopic evaluation of her small bowel. Due to this, we will proceed with an MR enterography and fecal calprotectin. I will also check surveillance labs for her IBD. Certainly, if she has disease activity Pentasa is not helping for her symptoms but she can continue for now. She will definitely need a biological if her Crohn's disease is active. Patient understood and agreed.  - Perform CBC, CMP, B12, Vitamin D, CRP and ESR - Perform fecal calprotectin - Schedule MRI enterography with IV contrast - Continue Bentyl and Imodium for symptom control - Continue Pentasa for now  All questions were answered.      Harvel Quale, MD Gastroenterology and Hepatology [facility]

## 2020-10-27 ENCOUNTER — Other Ambulatory Visit (INDEPENDENT_AMBULATORY_CARE_PROVIDER_SITE_OTHER): Payer: Self-pay | Admitting: Gastroenterology

## 2020-10-27 NOTE — Progress Notes (Signed)
Mrs Baggerly stated she does NOT need a refill on this medication she states the Dr changed this medication

## 2020-10-27 NOTE — Progress Notes (Signed)
I received a refill request from [facility] for 90-day supply of cholestyramine.  It looks like on her last medication review stated she was not taking this last week at her visit with Dr. Jenetta Downer.  Can you verify with patient if she actually needs a refill of cholestyramine.

## 2020-10-27 NOTE — Progress Notes (Signed)
Thank you - will disregard refill request

## 2020-10-28 ENCOUNTER — Other Ambulatory Visit (INDEPENDENT_AMBULATORY_CARE_PROVIDER_SITE_OTHER): Payer: Self-pay | Admitting: Gastroenterology

## 2020-10-28 DIAGNOSIS — K5000 Crohn's disease of small intestine without complications: Secondary | ICD-10-CM

## 2020-10-29 ENCOUNTER — Other Ambulatory Visit (INDEPENDENT_AMBULATORY_CARE_PROVIDER_SITE_OTHER): Payer: Self-pay | Admitting: Gastroenterology

## 2020-10-29 DIAGNOSIS — R7989 Other specified abnormal findings of blood chemistry: Secondary | ICD-10-CM

## 2020-10-29 LAB — C-REACTIVE PROTEIN: CRP: 11.5 mg/L — ABNORMAL HIGH (ref <8.0)

## 2020-10-29 LAB — COMPREHENSIVE METABOLIC PANEL
AG Ratio: 1.7 (calc) (ref 1.0–2.5)
ALT: 21 U/L (ref 6–29)
AST: 18 U/L (ref 10–35)
Albumin: 3.8 g/dL (ref 3.6–5.1)
Alkaline phosphatase (APISO): 73 U/L (ref 37–153)
BUN: 14 mg/dL (ref 7–25)
CO2: 24 mmol/L (ref 20–32)
Calcium: 9.1 mg/dL (ref 8.6–10.4)
Chloride: 104 mmol/L (ref 98–110)
Creat: 0.81 mg/dL (ref 0.50–0.99)
Globulin: 2.3 g/dL (calc) (ref 1.9–3.7)
Glucose, Bld: 118 mg/dL — ABNORMAL HIGH (ref 65–99)
Potassium: 3.7 mmol/L (ref 3.5–5.3)
Sodium: 141 mmol/L (ref 135–146)
Total Bilirubin: 0.6 mg/dL (ref 0.2–1.2)
Total Protein: 6.1 g/dL (ref 6.1–8.1)

## 2020-10-29 LAB — CBC WITH DIFFERENTIAL/PLATELET
Absolute Monocytes: 395 cells/uL (ref 200–950)
Basophils Absolute: 47 cells/uL (ref 0–200)
Basophils Relative: 0.5 %
Eosinophils Absolute: 85 cells/uL (ref 15–500)
Eosinophils Relative: 0.9 %
HCT: 41.4 % (ref 35.0–45.0)
Hemoglobin: 14.9 g/dL (ref 11.7–15.5)
Lymphs Abs: 1861 cells/uL (ref 850–3900)
MCH: 35 pg — ABNORMAL HIGH (ref 27.0–33.0)
MCHC: 36 g/dL (ref 32.0–36.0)
MCV: 97.2 fL (ref 80.0–100.0)
MPV: 10 fL (ref 7.5–12.5)
Monocytes Relative: 4.2 %
Neutro Abs: 7012 cells/uL (ref 1500–7800)
Neutrophils Relative %: 74.6 %
Platelets: 248 10*3/uL (ref 140–400)
RBC: 4.26 10*6/uL (ref 3.80–5.10)
RDW: 13.1 % (ref 11.0–15.0)
Total Lymphocyte: 19.8 %
WBC: 9.4 10*3/uL (ref 3.8–10.8)

## 2020-10-29 LAB — VITAMIN D 25 HYDROXY (VIT D DEFICIENCY, FRACTURES): Vit D, 25-Hydroxy: 13 ng/mL — ABNORMAL LOW (ref 30–100)

## 2020-10-29 LAB — VITAMIN B12: Vitamin B-12: 219 pg/mL (ref 200–1100)

## 2020-10-29 LAB — SEDIMENTATION RATE: Sed Rate: 9 mm/h (ref 0–30)

## 2020-10-29 MED ORDER — ERGOCALCIFEROL 50 MCG (2000 UT) PO CAPS: 1.0000 | ORAL_CAPSULE | Freq: Every day | ORAL | 5 refills | Status: DC

## 2020-11-02 LAB — CALPROTECTIN: Calprotectin: 90 mcg/g

## 2020-11-03 ENCOUNTER — Telehealth (INDEPENDENT_AMBULATORY_CARE_PROVIDER_SITE_OTHER): Payer: Self-pay | Admitting: *Deleted

## 2020-11-03 NOTE — Telephone Encounter (Signed)
So she is aware?  Thank You.

## 2020-11-03 NOTE — Telephone Encounter (Signed)
Perform CBC, CMP, B12, Vitamin D, CRP and ESR - Perform fecal calprotectin - Schedule MRI enterography with IV contrast - Continue Bentyl and Imodium for symptom control - Continue Pentasa for now     Elizabeth [name] - has the MRI Enterography with IV contrast been  posted? Refer to Dr. Colman Cater note below.

## 2020-11-03 NOTE — Telephone Encounter (Signed)
Patient has called the office and presented to the office wanting results of her Calprotectin. The results were not ready the first time . Today she left a message asking for result. She states that she is having Fecal Incontinence , and is sore around her waist.  When she presented to the office and it was explained to her that results were in and that Arnold needed to review it and then she would be called, she left office upset.  Addressed with Dr.Castaneda and he will review and call the patient. Cell Number is ([phone] , Home Number is [phone] .

## 2020-11-03 NOTE — Telephone Encounter (Signed)
Yes she is scheduled 11/11/20 at 10:00 am

## 2020-11-03 NOTE — Telephone Encounter (Signed)
Called the patient to both listed phone numbers, she did not pick up the phon. Left detailed voice messages explaining the calprotectin was slighltly elevated but it was less than 200, making it unlikely that she has severe inflammation, however she could have active small bowel disease as it can be falsely low in this scenario. Hence, will need to proceed with scheduled imaging.  Thanks

## 2020-11-04 ENCOUNTER — Other Ambulatory Visit (INDEPENDENT_AMBULATORY_CARE_PROVIDER_SITE_OTHER): Payer: Self-pay | Admitting: Gastroenterology

## 2020-11-04 MED ORDER — CHOLESTYRAMINE 4 GM/DOSE PO POWD: 4.0000 g | Freq: Two times a day (BID) | ORAL | 1 refills | Status: DC

## 2020-11-04 NOTE — Progress Notes (Signed)
Refill sent to pharmacy per [facility] per request

## 2020-11-11 ENCOUNTER — Other Ambulatory Visit: Payer: Self-pay

## 2020-11-11 ENCOUNTER — Telehealth (INDEPENDENT_AMBULATORY_CARE_PROVIDER_SITE_OTHER): Payer: Self-pay | Admitting: Internal Medicine

## 2020-11-11 ENCOUNTER — Ambulatory Visit (HOSPITAL_COMMUNITY)
Admission: RE | Admit: 2020-11-11 | Discharge: 2020-11-11 | Disposition: A | Discharge status: Home / Self Care | Payer: 59 | Source: Ambulatory Visit | Attending: Gastroenterology | Admitting: Gastroenterology

## 2020-11-11 DIAGNOSIS — K50018 Crohn's disease of small intestine with other complication: Secondary | ICD-10-CM

## 2020-11-11 DIAGNOSIS — K5000 Crohn's disease of small intestine without complications: Secondary | ICD-10-CM

## 2020-11-11 MED ORDER — BARIUM SULFATE 0.1 % PO SUSP
ORAL | Status: AC
  Filled 2020-11-11: qty 3

## 2020-11-11 MED ORDER — GLUCAGON HCL RDNA (DIAGNOSTIC) 1 MG IJ SOLR: 1.0000 mg | Freq: Once | INTRAMUSCULAR | Status: AC | PRN

## 2020-11-11 MED ORDER — GLUCAGON HCL RDNA (DIAGNOSTIC) 1 MG IJ SOLR
INTRAMUSCULAR | Status: AC
  Administered 2020-11-11: 1 mg via INTRAVENOUS
  Filled 2020-11-11: qty 1

## 2020-11-11 MED ORDER — GADOBUTROL 1 MMOL/ML IV SOLN
10.0000 mL | Freq: Once | INTRAVENOUS | Status: AC | PRN
  Administered 2020-11-11: 10 mL via INTRAVENOUS

## 2020-11-11 NOTE — Telephone Encounter (Signed)
Spoke with Erline Levine at [facility] she states the report is in epic for Dr to review,but scan showed active crohns and inflammation of the left ovary.

## 2020-11-11 NOTE — Telephone Encounter (Signed)
[facility] has a call report on this patient - ph# [phone]

## 2020-11-12 ENCOUNTER — Telehealth (INDEPENDENT_AMBULATORY_CARE_PROVIDER_SITE_OTHER): Payer: Self-pay | Admitting: Gastroenterology

## 2020-11-12 NOTE — Telephone Encounter (Signed)
I called the patient to inform her that her MR enterography showed presence of active inflammation at the neoterminal ileum and distal colon compatible with active Crohn's disease, while there was also presence of inflammation in the left ovary.  However, when I called the patient she pick up the phone and didn't allow me to talk, but she said that she will call back as she was busy at this moment.  The patient hung up the phone.  We we'll wait for the patient to reach back to discuss this findings and schedule a follow-up appointment.   [name], MD Gastroenterology and Hepatology [facility]

## 2020-11-12 NOTE — Telephone Encounter (Signed)
I called the patient to inform her that her MR enterography showed presence of active inflammation at the neoterminal ileum and distal colon compatible with active Crohn's disease, while there was also presence of inflammation in the left ovary.  However, when I called the patient she pick up the phone and didn't allow me to talk, but she said that she will call back as she was busy at this moment.  The patient hung up the phone.  We we'll wait for the patient to reach back to discuss this findings and schedule a follow-up appointment.  [name], MD Gastroenterology and Hepatology [facility]

## 2020-11-13 ENCOUNTER — Telehealth (INDEPENDENT_AMBULATORY_CARE_PROVIDER_SITE_OTHER): Payer: Self-pay | Admitting: Gastroenterology

## 2020-11-13 MED ORDER — PREDNISONE 10 MG PO TABS: ORAL_TABLET | ORAL | 0 refills | Status: DC

## 2020-11-13 NOTE — Telephone Encounter (Signed)
Per Dr. Jenetta Downer he spoke with the patient please see alternate note dated 11/13/2020.

## 2020-11-13 NOTE — Telephone Encounter (Signed)
I spoke with the patient today regarding the results of her MR enterography that showed persistence of inflammation at the anastomosis involving the neoterminal ileum and the proximal colon consistent with active Crohn's disease. I explained this to the patient thoroughly. I explained to her that it is likely that her Pentasa is not longer controlling her disease and further medications should be discussed such as biologicals (Entyvio versus Stelara) or immunomodulators, although I would favor more biologicals. The patient has a follow-up with Dr. Laural Golden in December to discuss this further. I will prescribe a short course of prednisone starting 40 mg every day with a rapid taper of 10 mg every week. The patient understood and agreed.

## 2020-11-14 ENCOUNTER — Other Ambulatory Visit (INDEPENDENT_AMBULATORY_CARE_PROVIDER_SITE_OTHER): Payer: Self-pay | Admitting: Internal Medicine

## 2020-11-14 DIAGNOSIS — K50818 Crohn's disease of both small and large intestine with other complication: Secondary | ICD-10-CM

## 2020-11-14 NOTE — Telephone Encounter (Signed)
I called the patient and went over her MR enterography results with her as Dr. Jenetta Downer. I suspect she has developed a relapse of Crohn's disease secondary to C. difficile colitis.  Eradication has been documented by negative studies. She has begun prednisone which was started by Dr. Jenetta Downer I agree patient will need to be on a biologic.  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 Will check QuantiFERON TB Gold plus and hepatitis B surface antigen. Will send request for Humira as soon as these test results are available. I told patient we would use a biologic that would be approved by her insurance.

## 2020-11-17 NOTE — Telephone Encounter (Signed)
Patient presented to the office to get lab orders. We will await the results, the start a PA for the Humira. Patient will be made aware of results and the status of the PA for Humira.

## 2020-11-19 LAB — QUANTIFERON-TB GOLD PLUS
Mitogen-NIL: 8.11 IU/mL
NIL: 0.02 IU/mL
QuantiFERON-TB Gold Plus: NEGATIVE
TB1-NIL: 0 IU/mL
TB2-NIL: 0 IU/mL

## 2020-11-19 LAB — HEPATITIS B SURFACE ANTIGEN: Hepatitis B Surface Ag: NONREACTIVE

## 2020-11-21 ENCOUNTER — Telehealth (INDEPENDENT_AMBULATORY_CARE_PROVIDER_SITE_OTHER): Payer: Self-pay | Admitting: *Deleted

## 2020-11-21 NOTE — Telephone Encounter (Signed)
Our office rec'd a fax from the patient's insurance stating that the Cholestyramine was not covered on her insurance.  Dr. Jenetta Downer ask that the patient be called and ask if she thought the medication was helping her. If so , he would call in another medication that is similar to this one.  Patient was called and she states that she has not been taking t his medication as she could not take it. It was changed to fiber gummies.

## 2020-11-24 ENCOUNTER — Other Ambulatory Visit (INDEPENDENT_AMBULATORY_CARE_PROVIDER_SITE_OTHER): Payer: Self-pay | Admitting: Internal Medicine

## 2020-12-02 ENCOUNTER — Other Ambulatory Visit: Payer: Self-pay

## 2020-12-02 ENCOUNTER — Ambulatory Visit (INDEPENDENT_AMBULATORY_CARE_PROVIDER_SITE_OTHER): Payer: 59 | Admitting: Internal Medicine

## 2020-12-02 ENCOUNTER — Encounter (INDEPENDENT_AMBULATORY_CARE_PROVIDER_SITE_OTHER): Payer: Self-pay | Admitting: Internal Medicine

## 2020-12-02 VITALS — BP 109/65 | HR 116 | Temp 99.0°F | Ht 67.0 in | Wt 205.5 lb

## 2020-12-02 DIAGNOSIS — K5080 Crohn's disease of both small and large intestine without complications: Secondary | ICD-10-CM

## 2020-12-02 DIAGNOSIS — K219 Gastro-esophageal reflux disease without esophagitis: Secondary | ICD-10-CM | POA: Diagnosis not present

## 2020-12-02 MED ORDER — PREDNISONE 5 MG PO TABS: 15.0000 mg | ORAL_TABLET | Freq: Every day | ORAL | 0 refills | Status: DC

## 2020-12-02 MED ORDER — HYDROCODONE-ACETAMINOPHEN 5-325 MG PO TABS: 1.0000 | ORAL_TABLET | Freq: Two times a day (BID) | ORAL | 0 refills | Status: DC | PRN

## 2020-12-02 NOTE — Patient Instructions (Signed)
Decrease prednisone to 15 mg starting tomorrow. Drop dose to 10 mg after 1 week and stay on it until biologic started.

## 2020-12-02 NOTE — Progress Notes (Signed)
Presenting complaint;  Follow-up for Crohn's disease.  Database and subjective:  Patient is 59 year old Caucasian female who has 70-year history of Crohn's disease status post right hemicolectomy November 2005 at [facility], history of GERD, history of diverticulitis status post sigmoid colon resection in January 2016 who is also felt to have IBS.  She was treated for C. difficile colitis initially in January this year and then in summer.  She was seen by Dr. Jenetta Downer about 5 weeks ago and underwent MR enterography which revealed active disease involving neoterminal ileum and proximal colon.  Patient was started on prednisone and plans were made for biologic therapy. Hepatitis B surface antigen and QuantiFERON TB Gold plus were negative. Request for biologic therapy has been sent to patient's insurance. We have not heard back yet. Patient was begun on prednisone 40 mg daily on 11/13/2020.  Patient remains on Pentasa. Patient states she is 100% better.  She states her appetite is crazy.  She states she woke up after midnight last night and had a full meal.  But she only has gained 3 pounds since her last visit.  She is not having abdominal pain anymore.  Since she cannot take any NSAIDs she wants to have few pain pills on hand just in case.  She says heartburn is well controlled with current dose of PPI.  She is still having to take Zofran every now and then.  She took 1 dose last week.  She is having about 2 stools per day.  Stool consistency varies between soft and formed.  No melena or rectal bleeding. Patient says she will drop prednisone dose to 10 mg tomorrow.   Current Medications: Outpatient Encounter Medications as of 12/02/2020  Medication Sig  . albuterol (VENTOLIN HFA) 108 (90 Base) MCG/ACT inhaler SMARTSIG:2 Puff(s) By Mouth Every 4 Hours PRN  . dicyclomine (BENTYL) 10 MG capsule TAKE 1 CAPSULE BY MOUTH THREE TIMES A DAY BEFORE MEALS  . Ergocalciferol 50 MCG (2000 UT) CAPS Take 1  each by mouth daily.  Marland Kitchen loperamide (IMODIUM) 2 MG capsule Take 1 capsule (2 mg total) by mouth daily with breakfast.  . mesalamine (PENTASA) 500 MG CR capsule Take 1,000 mg by mouth 4 (four) times daily.   Marland Kitchen omeprazole (PRILOSEC) 40 MG capsule TAKE 1 CAPSULE BY MOUTH ONCE DAILY  . ondansetron (ZOFRAN ODT) 8 MG disintegrating tablet Take 1 tablet (8 mg total) by mouth every 8 (eight) hours as needed for nausea or vomiting.  . potassium chloride (MICRO-K) 10 MEQ CR capsule Take 10 mEq by mouth 2 (two) times daily.   . predniSONE (DELTASONE) 10 MG tablet Take 4 tablets (40 mg total) by mouth daily with breakfast for 7 days, THEN 3 tablets (30 mg total) daily with breakfast for 7 days, THEN 2 tablets (20 mg total) daily with breakfast for 7 days, THEN 1 tablet (10 mg total) daily with breakfast for 7 days.  . Probiotic Product (PROBIOTIC PO) Take by mouth daily.  . promethazine (PHENERGAN) 25 MG tablet Take 1 tablet (25 mg total) by mouth 2 (two) times daily as needed for nausea or vomiting.  . rivaroxaban (XARELTO) 20 MG TABS tablet Take 20 mg by mouth daily with supper.  . valsartan-hydrochlorothiazide (DIOVAN-HCT) 160-12.5 MG per tablet Take 1 tablet by mouth daily.   . cholestyramine (QUESTRAN) 4 g packet TAKE 1 PACKET (4 G TOTAL) BY MOUTH 2 (TWO) TIMES DAILY WITH A MEAL. (Patient not taking: Reported on 12/02/2020)  . fidaxomicin (DIFICID) 200  MG TABS tablet Take 1 tablet (200 mg total) by mouth 2 (two) times daily. (Patient not taking: No sig reported)   No facility-administered encounter medications on file as of 12/02/2020.     Objective: Blood pressure 109/65, pulse (!) 116, temperature 99 F (37.2 C), temperature source Oral, height 5' 7"  (1.702 m), weight 205 lb 8 oz (93.2 kg). Patient is alert and in no acute distress. Patient is wearing a mask. Conjunctiva is pink. Sclera is nonicteric Oropharyngeal mucosa is normal. No neck masses or thyromegaly noted. Cardiac exam with regular  rhythm normal S1 and S2. No murmur or gallop noted. Lungs are clear to auscultation. Abdomen is full.  She has lower midline scar.  On palpation abdomen is soft and nontender with organomegaly or masses. No LE edema or clubbing noted. 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  Labs/studies Results:   CBC Latest Ref Rng & Units 10/28/2020 06/27/2020 05/27/2020  WBC 3.8 - 10.8 Thousand/uL 9.4 9.7 13.8(H)  Hemoglobin 11.7 - 15.5 g/dL 14.9 12.6 15.3(H)  Hematocrit 35.0 - 45.0 % 41.4 36.6 42.9  Platelets 140 - 400 Thousand/uL 248 430(H) 274    CMP Latest Ref Rng & Units 10/28/2020 06/27/2020 05/27/2020  Glucose 65 - 99 mg/dL 118(H) 87 110(H)  BUN 7 - 25 mg/dL 14 21 43(H)  Creatinine 0.50 - 0.99 mg/dL 0.81 1.12(H) 2.58(H)  Sodium 135 - 146 mmol/L 141 137 133(L)  Potassium 3.5 - 5.3 mmol/L 3.7 4.1 3.2(L)  Chloride 98 - 110 mmol/L 104 102 101  CO2 20 - 32 mmol/L 24 22 19(L)  Calcium 8.6 - 10.4 mg/dL 9.1 9.2 7.8(L)  Total Protein 6.1 - 8.1 g/dL 6.1 6.2 6.5  Total Bilirubin 0.2 - 1.2 mg/dL 0.6 0.5 1.0  Alkaline Phos 38 - 126 U/L - - 74  AST 10 - 35 U/L 18 14 19   ALT 6 - 29 U/L 21 16 26     Hepatic Function Latest Ref Rng & Units 10/28/2020 06/27/2020 05/27/2020  Total Protein 6.1 - 8.1 g/dL 6.1 6.2 6.5  Albumin 3.5 - 5.0 g/dL - - 3.3(L)  AST 10 - 35 U/L 18 14 19   ALT 6 - 29 U/L 21 16 26   Alk Phosphatase 38 - 126 U/L - - 74  Total Bilirubin 0.2 - 1.2 mg/dL 0.6 0.5 1.0  Bilirubin, Direct 0.0 - 0.3 mg/dL - - -    Lab Results  Component Value Date   CRP 11.5 (H) 10/28/2020    MR enterography images reviewed with patient. Inflammatory changes noted to distal small bowel neoterminal ileum and distal colon in the region of colorectal anastomosis.   Assessment:  #1.  Ileocolonic Crohn's disease.  Disease duration about 17 years.  She is status post right hemicolectomy about 16 years ago.  Recent flareup possibly triggered by C. difficile colitis which has been effectively treated.   She has responded 100% to prednisone which is being tapered. Patient was treated with infliximab prior to her surgery in 2005.  Failure to response was due to the fact that she already had developed a stricture. She also has been treated with 6-MP in the past.  This therapy was terminated because of bump in her transaminases. We will initiate biologic therapy as soon as it is approved.  It remains to be seen what will be improved by her insurance.  #2.  Chronic GERD.  She is doing well with high-dose PPI.  We will plan dose reduction next year.  #3.  History of colonic adenoma.  Last colonoscopy was in July 2015.  We will plan surveillance colonoscopy next year.  Plan:  Will change prednisone taper as follows Drop dose to 15 mg every morning starting tomorrow morning and drop dose to 10 mg after 1 week. Medication list updated. Hydrocodone/acetaminophen 5/325 1 tablet p.o. twice daily as needed 20 doses without refill. Please note patients overdose risk score is 250. For now she will continue oral mesalamine and dicyclomine. Begin biologic therapy as soon as it is approved. Office visit in 3 months.

## 2020-12-03 ENCOUNTER — Other Ambulatory Visit (INDEPENDENT_AMBULATORY_CARE_PROVIDER_SITE_OTHER): Payer: Self-pay | Admitting: Gastroenterology

## 2020-12-10 ENCOUNTER — Telehealth (INDEPENDENT_AMBULATORY_CARE_PROVIDER_SITE_OTHER): Payer: Self-pay | Admitting: *Deleted

## 2020-12-10 NOTE — Telephone Encounter (Signed)
Patient has been approved for the induction therapy Humira 25m/0.4mL prefilled pens. The case number is [id number] The approval dates are 12/04/2020--03/04/2021.  A prescription was called to the [facility],Pharmacist as follows Humira prefilled pens for induction- Patient will inject Humira 80 mg SQ on days 1 and 2. Day 15 she will inject Humira 80 mg SQ. # 3 pens to be dispensed.  They are going to reach out to the patient to arrange deliverer the medication.  We will need to do a PA for the maintenance therapy. Then fax that approval to the specialty pharmacy [phone] The prescription was given to April Nagel,pharmacist as follows for maintenance therapy and will be put on file until needed. Humira 40 mg/0.4 mL -patient will inject 40 mg SQ every other week. # 2 with 1 year refills.  Patient has been called and made aware.

## 2020-12-17 ENCOUNTER — Telehealth (INDEPENDENT_AMBULATORY_CARE_PROVIDER_SITE_OTHER): Payer: Self-pay | Admitting: *Deleted

## 2020-12-17 NOTE — Telephone Encounter (Signed)
Patient had called and said that [facility] had called her and told her that they did not accept her insurance any longer. This is after she was approved for the starter kit.  I called [facility] and spoke with [place] who confirmed that the Insurance had termed as of 12-12-20, but since they were closed on this date they termed in on 12-11-2020. She advised that I call [employer] as they has transferred the prescription [phone].  When I called this number I spoke to [place]. She verified patient's insurance. The ID number that we had a copy of was not valid for this patient.  Patient was called and given this information. She states that she just got a new insurance card and as we reviewed what we had it was all different on the card that she had received. Patient will bring the card by office tomorrow. We will redo the PA at this time.

## 2020-12-22 ENCOUNTER — Encounter (INDEPENDENT_AMBULATORY_CARE_PROVIDER_SITE_OTHER): Payer: Self-pay | Admitting: *Deleted

## 2020-12-26 ENCOUNTER — Other Ambulatory Visit (INDEPENDENT_AMBULATORY_CARE_PROVIDER_SITE_OTHER): Payer: Self-pay | Admitting: Internal Medicine

## 2020-12-26 NOTE — Telephone Encounter (Signed)
There has been a problem with Insurance as the patient changed her Insurance after the Pa was approved. So she may need this until this Insurance can be resolved. I worked all day  on this.

## 2020-12-26 NOTE — Telephone Encounter (Signed)
Was this requested by the patient or an automatic refill? Per Dr. Laural Golden she was supposed to taper there medication as she is supposed to start the Humira

## 2020-12-26 NOTE — Telephone Encounter (Signed)
Ok, will fill for a month only Thanks

## 2020-12-31 ENCOUNTER — Telehealth (INDEPENDENT_AMBULATORY_CARE_PROVIDER_SITE_OTHER): Payer: Self-pay | Admitting: *Deleted

## 2020-12-31 NOTE — Telephone Encounter (Signed)
Patient's Insurance changed after the PA was approved for the Induction to Humira Therapy. Various [employer] are [employer]. Primary insurance is still with [place]. The plan number and the pharmacy numbers changed.  On 12/23/2020 a prescription was given to [facility] with [facility]. He explains that they are the speciality pharmacy for the patient's paln. All PA go through [facility].   This morning I talked with [place] with [employer]. She tells me that they cannot run the RX given on 12/23/2020 as it tells them that this is to soon. While on the phone she called the [employer] who had processed the RX originally , they reversed the claim. However it will still be tomorrow before [employer] can process the new RX. If when they process it they will call the patient to arrange the delivery or make her aware that a PA will need to be done with the new card. At which time the patient will make our office aware. We will then precede with a PA and do this with [facility]. [phone].  The patient was called and made aware.

## 2021-01-05 ENCOUNTER — Other Ambulatory Visit: Payer: Self-pay

## 2021-01-05 ENCOUNTER — Ambulatory Visit (INDEPENDENT_AMBULATORY_CARE_PROVIDER_SITE_OTHER): Payer: 59 | Admitting: Internal Medicine

## 2021-01-05 ENCOUNTER — Encounter (INDEPENDENT_AMBULATORY_CARE_PROVIDER_SITE_OTHER): Payer: Self-pay

## 2021-01-05 ENCOUNTER — Ambulatory Visit (INDEPENDENT_AMBULATORY_CARE_PROVIDER_SITE_OTHER): Payer: 59

## 2021-01-05 VITALS — BP 119/75 | HR 116 | Temp 98.3°F | Ht 67.0 in

## 2021-01-05 DIAGNOSIS — K5080 Crohn's disease of both small and large intestine without complications: Secondary | ICD-10-CM

## 2021-01-05 NOTE — Progress Notes (Unsigned)
Patient presented to the office to start her Induction to Humira Therapy. Patient's Vitals, Medications are updated in her chart. Reviewed the possible side effects of the Humira. The injection sited and how to properly use the Humira Prefilled Syringe.  Patient understood and all her questions were answered. The patient received dose 1 Humria 80 mg SQ to the right abdomen. She tolerated well. Expiration Date: February 2023. Lot Number: [id number].  Dr.Rehman did go in with the patient. Documentation to follow.

## 2021-01-05 NOTE — Progress Notes (Signed)
Patient was observed for a few minutes after she received first dose of Humira/adalimumab. She did not experience any side effects. Patient dropped prednisone dose to 10 mg as of yesterday.  She is concerned about weight gain. She also tells me that her husband is hospitalized at [facility] for C. difficile colitis. Patient was treated for C. difficile colitis twice last year but not in the last 6 months. She does not recall that her husband took any antibiotics prior to his illness. Patient says she is having 4-5 bowel movements every morning.  Stool volume is small and consistency is soft mushy.  No melena or rectal bleeding. Patient has office visit on January 13, 2021. We recommend that she return for follow-up visit after 2 to 3 weeks at which time we can determine response to Humira/adalimumab.  She would also be off prednisone by that time.

## 2021-01-13 ENCOUNTER — Ambulatory Visit (INDEPENDENT_AMBULATORY_CARE_PROVIDER_SITE_OTHER): Payer: 59 | Admitting: Internal Medicine

## 2021-01-27 ENCOUNTER — Encounter (INDEPENDENT_AMBULATORY_CARE_PROVIDER_SITE_OTHER): Payer: Self-pay | Admitting: Internal Medicine

## 2021-01-27 ENCOUNTER — Ambulatory Visit (INDEPENDENT_AMBULATORY_CARE_PROVIDER_SITE_OTHER): Payer: 59 | Admitting: Internal Medicine

## 2021-01-27 ENCOUNTER — Other Ambulatory Visit: Payer: Self-pay

## 2021-01-27 VITALS — BP 99/60 | HR 82 | Temp 98.9°F | Ht 67.0 in | Wt 215.1 lb

## 2021-01-27 DIAGNOSIS — K5080 Crohn's disease of both small and large intestine without complications: Secondary | ICD-10-CM

## 2021-01-27 DIAGNOSIS — K219 Gastro-esophageal reflux disease without esophagitis: Secondary | ICD-10-CM

## 2021-01-27 DIAGNOSIS — K589 Irritable bowel syndrome without diarrhea: Secondary | ICD-10-CM

## 2021-01-27 MED ORDER — PREDNISONE 5 MG PO TABS: 10.0000 mg | ORAL_TABLET | Freq: Every day | ORAL | 0 refills | Status: DC

## 2021-01-27 MED ORDER — DICYCLOMINE HCL 10 MG PO CAPS
10.0000 mg | ORAL_CAPSULE | Freq: Three times a day (TID) | ORAL | 5 refills | Status: DC | PRN
Start: 2021-01-27 — End: 2021-12-22

## 2021-01-27 MED ORDER — OMEPRAZOLE 20 MG PO CPDR: 20.0000 mg | DELAYED_RELEASE_CAPSULE | Freq: Every day | ORAL | 3 refills | Status: DC

## 2021-01-27 NOTE — Patient Instructions (Signed)
Try to take dicyclomine 20 mg before breakfast. Loperamide 2 mg daily at bedtime. Prednisone 10 mg by mouth daily with breakfast for 2 weeks and thereafter 7.5 mg daily for 2 weeks then 5 mg daily for 2 weeks. Please notify if omeprazole at 20 mg daily does not control heartburn. Stool test and blood tests to be done 1 to 2 weeks prior to next office visit.

## 2021-01-27 NOTE — Progress Notes (Signed)
Presenting complaint;  Follow-up for Crohn's disease.  Database and subjective:  Patient is 60 year old Caucasian female with 7-year history of ileocolonic Crohn's disease, status post right hemicolectomy 16 years ago who was treated for C. difficile colitis twice last year.  She developed a relapse of her Crohn's disease and she was treated with prednisone and symptoms relapsed as steroids tapered. She had MR enterography on 11/11/2020 revealing active disease in neoterminal ileum and sigmoid colon.  She was retreated with prednisone and maintained on oral mesalamine.  Her fecal calprotectin on 10/29/2020 was 90.  QuantiFERON-TB gold plus was negative and hepatitis B surface antigen was also negative.  Humira/adalimumab was approved and she received 80 mg subcutaneously 01/05/2021 and 01/06/2021.  He received third dose on day 15.  Patient was on 10 mg at the time and she was advised to drop the dose to 5 mg daily for 1 week and stop.  Patient returns for scheduled visit she is not having any side effects with Humira/adalimumab.  She says she was doing great while she was on 10 mg of prednisone daily.  Last dose was on 01/22/2021.  She was on 5 mg daily when she stopped it.  Now she feels bad.  She is not having any nausea vomiting abdominal pain fever or chills.  She was having formed stools while she was on prednisone.  Now she is having loose explosive bowel movement between 1 AM and 2 AM every morning and she may have another 4-5 small volume stools rest the morning and usually nothing after 8 AM.  She denies melena or rectal bleeding.  She does not take dicyclomine as recommended as she forgets to take it.  She feels heartburn is well controlled with therapy.  She has not lost any weight since she came off prednisone. She also complains of aching sensation under the right rib cage.  However she has noted that we are not tender when she examines herself.  This discomfort is described to be mild and  intermittent and not associated with any other symptoms.  It does not radiate into her back.  She is not having hematuria.   Current Medications: Outpatient Encounter Medications as of 01/27/2021  Medication Sig  . Adalimumab (HUMIRA) 40 MG/0.4ML PSKT Inject 40 mg into the skin every 14 (fourteen) days. Patient will start the maintenance Tuesday February 22,2022  . albuterol (VENTOLIN HFA) 108 (90 Base) MCG/ACT inhaler SMARTSIG:2 Puff(s) By Mouth Every 4 Hours PRN  . dicyclomine (BENTYL) 10 MG capsule TAKE 1 CAPSULE BY MOUTH THREE TIMES A DAY BEFORE MEALS  . Ergocalciferol 50 MCG (2000 UT) CAPS Take 1 each by mouth daily.  Marland Kitchen HYDROcodone-acetaminophen (NORCO/VICODIN) 5-325 MG tablet Take 1 tablet by mouth 2 (two) times daily as needed for moderate pain.  . mesalamine (PENTASA) 500 MG CR capsule Take 1,000 mg by mouth 4 (four) times daily.   Marland Kitchen omeprazole (PRILOSEC) 40 MG capsule TAKE 1 CAPSULE BY MOUTH ONCE DAILY  . ondansetron (ZOFRAN ODT) 8 MG disintegrating tablet Take 1 tablet (8 mg total) by mouth every 8 (eight) hours as needed for nausea or vomiting.  . potassium chloride (MICRO-K) 10 MEQ CR capsule Take 10 mEq by mouth 2 (two) times daily.   . Probiotic Product (PROBIOTIC PO) Take by mouth daily.  . rivaroxaban (XARELTO) 20 MG TABS tablet Take 20 mg by mouth daily with supper.  . valsartan-hydrochlorothiazide (DIOVAN-HCT) 160-12.5 MG per tablet Take 1 tablet by mouth daily.   . Adalimumab (HUMIRA  PEN-CD/UC/HS STARTER) 80 MG/0.8ML PNKT Inject 80 mLs into the skin. Day 1 - 80 mg SQ Day 2 - 80 mg SQ Day 15 - 80 mg SQ  Maintenance will begin on day 14 after the last induction dose and will be given SQ every other week or every 14 days. (Patient not taking: Reported on 01/27/2021)  . loperamide (IMODIUM) 2 MG capsule Take 1 capsule (2 mg total) by mouth daily with breakfast. (Patient not taking: Reported on 01/27/2021)  . predniSONE (DELTASONE) 5 MG tablet TAKE 3 TABLETS (15 MG TOTAL) BY MOUTH  DAILY WITH BREAKFAST. (Patient not taking: Reported on 01/27/2021)  . [DISCONTINUED] promethazine (PHENERGAN) 25 MG tablet Take 1 tablet (25 mg total) by mouth 2 (two) times daily as needed for nausea or vomiting. (Patient not taking: No sig reported)   No facility-administered encounter medications on file as of 01/27/2021.     Objective: Blood pressure 99/60, pulse 82, temperature 98.9 F (37.2 C), temperature source Oral, height 5' 7"  (1.702 m), weight 215 lb 1.6 oz (97.6 kg). Patient is alert and in no acute distress. She is wearing a mask. Conjunctiva is pink. Sclera is nonicteric Oropharyngeal mucosa is normal. No neck masses or thyromegaly noted. Cardiac exam with regular rhythm normal S1 and S2. No murmur or gallop noted. Lungs are clear to auscultation. Abdomen is full.  She has lower midline scar.  On palpation abdomen is soft and nontender with organomegaly or masses. No LE edema or clubbing noted. 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  Labs/studies Results:  CBC Latest Ref Rng & Units 10/28/2020 06/27/2020 05/27/2020  WBC 3.8 - 10.8 Thousand/uL 9.4 9.7 13.8(H)  Hemoglobin 11.7 - 15.5 g/dL 14.9 12.6 15.3(H)  Hematocrit 35.0 - 45.0 % 41.4 36.6 42.9  Platelets 140 - 400 Thousand/uL 248 430(H) 274    CMP Latest Ref Rng & Units 10/28/2020 06/27/2020 05/27/2020  Glucose 65 - 99 mg/dL 118(H) 87 110(H)  BUN 7 - 25 mg/dL 14 21 43(H)  Creatinine 0.50 - 0.99 mg/dL 0.81 1.12(H) 2.58(H)  Sodium 135 - 146 mmol/L 141 137 133(L)  Potassium 3.5 - 5.3 mmol/L 3.7 4.1 3.2(L)  Chloride 98 - 110 mmol/L 104 102 101  CO2 20 - 32 mmol/L 24 22 19(L)  Calcium 8.6 - 10.4 mg/dL 9.1 9.2 7.8(L)  Total Protein 6.1 - 8.1 g/dL 6.1 6.2 6.5  Total Bilirubin 0.2 - 1.2 mg/dL 0.6 0.5 1.0  Alkaline Phos 38 - 126 U/L - - 74  AST 10 - 35 U/L 18 14 19   ALT 6 - 29 U/L 21 16 26     Hepatic Function Latest Ref Rng & Units 10/28/2020 06/27/2020 05/27/2020  Total Protein 6.1 - 8.1 g/dL 6.1 6.2 6.5   Albumin 3.5 - 5.0 g/dL - - 3.3(L)  AST 10 - 35 U/L 18 14 19   ALT 6 - 29 U/L 21 16 26   Alk Phosphatase 38 - 126 U/L - - 74  Total Bilirubin 0.2 - 1.2 mg/dL 0.6 0.5 1.0  Bilirubin, Direct 0.0 - 0.3 mg/dL - - -    Lab Results  Component Value Date   CRP 11.5 (H) 10/28/2020     Assessment:  #1.  Ileocolonic Crohn's disease.  Patient has received 3 doses of Humira/adalimumab for induction. It is too early to to see response.  She has developed diarrhea since prednisone was tapered and stopped.  She has history of IBS which may also be contributing to her diarrhea.  It is interesting  to note that most of her bowel movements occur between 1 AM and 8 AM.  She does not appear to be toxic She reports feeling bad.  Doubt that we are dealing with suppression of adrenal function will steroid therapy.  #2.  GERD.  She is doing well with omeprazole 40 mg daily.  I wonder if omeprazole is also contributing to increased stool frequency.  We will drop dose to 20 mg daily.  #3.  Vague right upper quadrant abdominal pain possibly referred pain she has no tenderness or guarding on exam.  Plan:  Decrease omeprazole dose to 20 mg p.o. every morning. Patient will go back on prednisone 10 mg daily for 2 weeks and thereafter 7.5 mg daily for 2 weeks and then 5 mg daily for 2 weeks and stop. Patient advised to take loperamide OTC 2 mg at bedtime. She will take dicyclomine 20 mg daily before breakfast. She will have CBC with differential, comprehensive chemistry panel, CRP and fecal calprotectin 1 week prior to her next office visit. Next office visit in in 7 to 8 weeks.

## 2021-01-29 ENCOUNTER — Other Ambulatory Visit (INDEPENDENT_AMBULATORY_CARE_PROVIDER_SITE_OTHER): Payer: Self-pay | Admitting: *Deleted

## 2021-01-29 DIAGNOSIS — K5080 Crohn's disease of both small and large intestine without complications: Secondary | ICD-10-CM

## 2021-02-05 ENCOUNTER — Telehealth (INDEPENDENT_AMBULATORY_CARE_PROVIDER_SITE_OTHER): Payer: Self-pay | Admitting: *Deleted

## 2021-02-05 NOTE — Telephone Encounter (Signed)
Patient left a voicemail on 02/04/2021 @ 4:52 pm. She wanted to let Elizabeth [name] know that she saw Dr.Daniel and he confirmed that she had C-Diff again. He has put her on Vancomycin 125 mg four times a day.  This was discussed with Dr.Rehman. He recommends that the patient hold her Humira Injection for 1 week or until her stools are back to normal. He recommends that she takes the Vancomycin mcg for 10 days , then another 2 weeks.   Talked with the patient and she states that she injected Humira 40 mg SQ on Monday February 21 st. She will hold the Humira as Dr.Rehman instructed.  [place] will call our office as she gets close to finishing the Vancomycin 125 mg, so that we can call in another prescription for her and will update Elizabeth [name] on how her stools are.

## 2021-02-13 NOTE — Telephone Encounter (Signed)
Addressed with Dr.Rehman. He ask that the patient continue the Vancomycin 125 mg - Take 1 by mouth twice a day for 10 days. #20 no refills. This was called to [facility] in [place]. He also stated that the patient may resume her Humira as her stools are formed.  Patient was called and made aware.

## 2021-02-18 ENCOUNTER — Other Ambulatory Visit: Payer: Self-pay | Admitting: Family Medicine

## 2021-02-18 DIAGNOSIS — F17219 Nicotine dependence, cigarettes, with unspecified nicotine-induced disorders: Secondary | ICD-10-CM

## 2021-02-21 ENCOUNTER — Other Ambulatory Visit (INDEPENDENT_AMBULATORY_CARE_PROVIDER_SITE_OTHER): Payer: Self-pay | Admitting: Internal Medicine

## 2021-02-23 ENCOUNTER — Other Ambulatory Visit (INDEPENDENT_AMBULATORY_CARE_PROVIDER_SITE_OTHER): Payer: Self-pay | Admitting: *Deleted

## 2021-02-23 ENCOUNTER — Telehealth (INDEPENDENT_AMBULATORY_CARE_PROVIDER_SITE_OTHER): Payer: Self-pay | Admitting: *Deleted

## 2021-02-23 ENCOUNTER — Other Ambulatory Visit (INDEPENDENT_AMBULATORY_CARE_PROVIDER_SITE_OTHER): Payer: Self-pay | Admitting: Internal Medicine

## 2021-02-23 DIAGNOSIS — K5080 Crohn's disease of both small and large intestine without complications: Secondary | ICD-10-CM

## 2021-02-23 DIAGNOSIS — R197 Diarrhea, unspecified: Secondary | ICD-10-CM

## 2021-02-23 DIAGNOSIS — A0472 Enterocolitis due to Clostridium difficile, not specified as recurrent: Secondary | ICD-10-CM

## 2021-02-23 NOTE — Telephone Encounter (Signed)
Jema left a voicemail. She states that she will be completing  Her Vancomycin today. She states that since she cut down to 2 a day she has been having multiple stools.Today she has had 8 BM's they are very loose. She wonders if she should continue to take the Vancomycin?  Per Dr. Laural Golden the patient  She needs to have a C-Diff by PCR before we order the dificid. She is to continue the Vancomycin at current dose , she can take Imodium on schedule.  The stool study has been completed for the patient to go to [facility]. Patient was called message was left with Dr. Olevia Perches instruction.

## 2021-02-23 NOTE — Telephone Encounter (Signed)
No plans for refills as of now, needs to follow with Dr. Laural Golden if this needs to be refilled or readjusted. She had a recurrent C. Diff infection as well.

## 2021-02-27 LAB — CLOSTRIDIUM DIFFICILE BY PCR: Toxigenic C. Difficile by PCR: NEGATIVE

## 2021-03-11 LAB — COMPREHENSIVE METABOLIC PANEL
AG Ratio: 1.7 (calc) (ref 1.0–2.5)
ALT: 38 U/L — ABNORMAL HIGH (ref 6–29)
AST: 34 U/L (ref 10–35)
Albumin: 3.6 g/dL (ref 3.6–5.1)
Alkaline phosphatase (APISO): 55 U/L (ref 37–153)
BUN: 13 mg/dL (ref 7–25)
CO2: 24 mmol/L (ref 20–32)
Calcium: 8.5 mg/dL — ABNORMAL LOW (ref 8.6–10.4)
Chloride: 108 mmol/L (ref 98–110)
Creat: 0.77 mg/dL (ref 0.50–0.99)
Globulin: 2.1 g/dL (calc) (ref 1.9–3.7)
Glucose, Bld: 115 mg/dL (ref 65–139)
Potassium: 4 mmol/L (ref 3.5–5.3)
Sodium: 142 mmol/L (ref 135–146)
Total Bilirubin: 0.4 mg/dL (ref 0.2–1.2)
Total Protein: 5.7 g/dL — ABNORMAL LOW (ref 6.1–8.1)

## 2021-03-11 LAB — CBC WITH DIFFERENTIAL/PLATELET
Absolute Monocytes: 370 cells/uL (ref 200–950)
Basophils Absolute: 50 cells/uL (ref 0–200)
Basophils Relative: 0.9 %
Eosinophils Absolute: 123 cells/uL (ref 15–500)
Eosinophils Relative: 2.2 %
HCT: 38.9 % (ref 35.0–45.0)
Hemoglobin: 13.4 g/dL (ref 11.7–15.5)
Lymphs Abs: 1506 cells/uL (ref 850–3900)
MCH: 34.3 pg — ABNORMAL HIGH (ref 27.0–33.0)
MCHC: 34.4 g/dL (ref 32.0–36.0)
MCV: 99.5 fL (ref 80.0–100.0)
MPV: 10.6 fL (ref 7.5–12.5)
Monocytes Relative: 6.6 %
Neutro Abs: 3550 cells/uL (ref 1500–7800)
Neutrophils Relative %: 63.4 %
Platelets: 227 10*3/uL (ref 140–400)
RBC: 3.91 10*6/uL (ref 3.80–5.10)
RDW: 11.8 % (ref 11.0–15.0)
Total Lymphocyte: 26.9 %
WBC: 5.6 10*3/uL (ref 3.8–10.8)

## 2021-03-11 LAB — C-REACTIVE PROTEIN: CRP: 5.5 mg/L (ref <8.0)

## 2021-03-13 ENCOUNTER — Ambulatory Visit (HOSPITAL_COMMUNITY): Admission: RE | Admit: 2021-03-13 | Payer: 59 | Source: Ambulatory Visit

## 2021-03-16 LAB — CALPROTECTIN: Calprotectin: 618 mcg/g — ABNORMAL HIGH

## 2021-03-31 ENCOUNTER — Other Ambulatory Visit: Payer: Self-pay

## 2021-03-31 ENCOUNTER — Ambulatory Visit (INDEPENDENT_AMBULATORY_CARE_PROVIDER_SITE_OTHER): Payer: 59 | Admitting: Internal Medicine

## 2021-03-31 ENCOUNTER — Encounter (INDEPENDENT_AMBULATORY_CARE_PROVIDER_SITE_OTHER): Payer: Self-pay | Admitting: Internal Medicine

## 2021-03-31 VITALS — BP 108/77 | HR 86 | Temp 97.8°F | Ht 67.0 in | Wt 219.5 lb

## 2021-03-31 DIAGNOSIS — R109 Unspecified abdominal pain: Secondary | ICD-10-CM

## 2021-03-31 DIAGNOSIS — K50818 Crohn's disease of both small and large intestine with other complication: Secondary | ICD-10-CM

## 2021-03-31 DIAGNOSIS — K219 Gastro-esophageal reflux disease without esophagitis: Secondary | ICD-10-CM | POA: Diagnosis not present

## 2021-03-31 MED ORDER — HYDROCODONE-ACETAMINOPHEN 5-325 MG PO TABS: 1.0000 | ORAL_TABLET | Freq: Two times a day (BID) | ORAL | 0 refills | Status: DC | PRN

## 2021-03-31 NOTE — Progress Notes (Signed)
Presenting complaint;  Follow-up for Crohn's disease and GERD. Patient complains of pain around right costal margin. Patient reports having developed ventral hernia.  Database and subjective:  Patient is 60 year old Caucasian female who has history of Crohn's disease involving small and large bowel chronic GERD as well as history of sigmoid diverticulitis who is here for scheduled visit.  She also has IBS. She was diagnosed with Crohn's disease 17 years ago and underwent right hemicolectomy in November 2005 at [facility]. She had sigmoid colon resection for diverticulitis in January 2016. She was was begun on Humira adalimumab on 01/05/2021 for Crohn's disease. She was last seen on 01/27/2021.  She has been off prednisone for 5 to 6 weeks. On her last visit she was advised to decrease omeprazole to 20 mg daily.  She was also advised to take dicyclomine on schedule and 2 mg of Imodium at bedtime.  She had fecal calprotectin of 618 about 3 weeks ago.  Patient states she had to go back on 40 mg of omeprazole daily as 20 mg did not work.  She began to experience bad heartburn.  40 mg is working.  She will call when she runs out of current prescription. He states he is having 2 bowel movements per day.  She has occasional abdominal cramping.  She denies melena or rectal bleeding.  Her stools are soft to loose.  She is taking dicyclomine as recommended but she is not taking loperamide as she feels it gives her abdominal pain.  She has not taken it in few weeks. She continues to complain of pain around right costal subcostal region.  She was complaining of this pain on her last visit.  She says pain is more pronounced.  She has no pain until about 3:00 in the afternoon and then she may have it for couple hours.  This pain is not associated with nausea vomiting diarrhea or hematuria.  Pain is described to be nagging and may last for couple of hours.  She says she sleeps on her right side.  She does not  have chronic cough or chest pain. She has gained 4 pounds since her last visit.  She drinks 5 sodas a day.  She feels very frustrated because she has no energy and feels tired all the time.  She does not feel that she is depressed.  She is trying to walk some. She also complains of developing abdominal hernia since her last visit. She also misses the fact that she is not able to work anymore.  Last time she worked was in May 2021.  She also has a history of recurrent C. difficile colitis.  She was treated twice last year and once this year.  Vancomycin therapy was extended for over 3 weeks.  Current Medications: Outpatient Encounter Medications as of 03/31/2021  Medication Sig  . Adalimumab (HUMIRA) 40 MG/0.4ML PSKT Inject 40 mg into the skin every 14 (fourteen) days. Patient will start the maintenance Tuesday February 22,2022  . albuterol (VENTOLIN HFA) 108 (90 Base) MCG/ACT inhaler SMARTSIG:2 Puff(s) By Mouth Every 4 Hours PRN  . dicyclomine (BENTYL) 10 MG capsule Take 1 capsule (10 mg total) by mouth 3 (three) times daily as needed for spasms.  . Ergocalciferol 50 MCG (2000 UT) CAPS Take 1 each by mouth daily.  Marland Kitchen HYDROcodone-acetaminophen (NORCO/VICODIN) 5-325 MG tablet Take 1 tablet by mouth 2 (two) times daily as needed for moderate pain.  Marland Kitchen loperamide (IMODIUM) 2 MG capsule Take 1 capsule (2 mg  total) by mouth daily with breakfast.  . mesalamine (PENTASA) 500 MG CR capsule Take 1,000 mg by mouth 4 (four) times daily.   Marland Kitchen omeprazole (PRILOSEC) 20 MG capsule Take 1 capsule (20 mg total) by mouth daily.  . ondansetron (ZOFRAN ODT) 8 MG disintegrating tablet Take 1 tablet (8 mg total) by mouth every 8 (eight) hours as needed for nausea or vomiting.  . potassium chloride (MICRO-K) 10 MEQ CR capsule Take 10 mEq by mouth 2 (two) times daily.   . predniSONE (DELTASONE) 5 MG tablet Take 2 tablets (10 mg total) by mouth daily with breakfast.  . Probiotic Product (PROBIOTIC PO) Take by mouth daily.   . rivaroxaban (XARELTO) 20 MG TABS tablet Take 20 mg by mouth daily with supper.  . valsartan-hydrochlorothiazide (DIOVAN-HCT) 160-12.5 MG per tablet Take 1 tablet by mouth daily.   . vancomycin (VANCOCIN) 125 MG capsule TAKE 1 CAPSULE BY MOUTH 2 TIMES DAILY FOR 10 MORE DAYS   No facility-administered encounter medications on file as of 03/31/2021.     Objective: Blood pressure 108/77, pulse 86, temperature 97.8 F (36.6 C), temperature source Oral, height 5' 7"  (1.702 m), weight 219 lb 8 oz (99.6 kg). Patient is alert and in no acute distress. She is wearing a mask. Conjunctiva is pink. Sclera is nonicteric Oropharyngeal mucosa is normal. No neck masses or thyromegaly noted. Cardiac exam with regular rhythm normal S1 and S2. No murmur or gallop noted. Lungs are clear to auscultation. Abdomen is full.  Bowel sounds are normal.  She has low midline scar.  She has ventral hernia to the right of midline above level of umbilicus.  Hernia is completely reducible.  No organomegaly or masses. She has tenderness over and just below the right costal margin from back to the front. Nonpitting pretibial edema.  Distal phalanx of left fourth finger has been amputated.  Labs/studies Results:  CBC Latest Ref Rng & Units 03/10/2021 10/28/2020 06/27/2020  WBC 3.8 - 10.8 Thousand/uL 5.6 9.4 9.7  Hemoglobin 11.7 - 15.5 g/dL 13.4 14.9 12.6  Hematocrit 35.0 - 45.0 % 38.9 41.4 36.6  Platelets 140 - 400 Thousand/uL 227 248 430(H)    CMP Latest Ref Rng & Units 03/10/2021 10/28/2020 06/27/2020  Glucose 65 - 139 mg/dL 115 118(H) 87  BUN 7 - 25 mg/dL 13 14 21   Creatinine 0.50 - 0.99 mg/dL 0.77 0.81 1.12(H)  Sodium 135 - 146 mmol/L 142 141 137  Potassium 3.5 - 5.3 mmol/L 4.0 3.7 4.1  Chloride 98 - 110 mmol/L 108 104 102  CO2 20 - 32 mmol/L 24 24 22   Calcium 8.6 - 10.4 mg/dL 8.5(L) 9.1 9.2  Total Protein 6.1 - 8.1 g/dL 5.7(L) 6.1 6.2  Total Bilirubin 0.2 - 1.2 mg/dL 0.4 0.6 0.5  Alkaline Phos 38 - 126 U/L -  - -  AST 10 - 35 U/L 34 18 14  ALT 6 - 29 U/L 38(H) 21 16    Hepatic Function Latest Ref Rng & Units 03/10/2021 10/28/2020 06/27/2020  Total Protein 6.1 - 8.1 g/dL 5.7(L) 6.1 6.2  Albumin 3.5 - 5.0 g/dL - - -  AST 10 - 35 U/L 34 18 14  ALT 6 - 29 U/L 38(H) 21 16  Alk Phosphatase 38 - 126 U/L - - -  Total Bilirubin 0.2 - 1.2 mg/dL 0.4 0.6 0.5  Bilirubin, Direct 0.0 - 0.3 mg/dL - - -    Lab Results  Component Value Date   CRP 5.5 03/10/2021  Fecal calprotectin was 618 on 03/11/2021.   Assessment:  #1.  Crohn's disease.  While she has a history of small and large bowel Crohn's disease MR in November 2021 revealed active disease in neoterminal ileum.  She has been on Humira/adalimumab for almost 12 weeks.  Abdominal examination is benign and reassuring.  Would consider stopping mesalamine in future if she is deemed to be in long-term remission.  She is not having any side effects with Humira/adalimumab.  #2.  Chronic GERD.  Heartburn could not be well controlled with low-dose omeprazole and she is back on 40 mg daily.  Maybe when she is able to lose weight we will make another attempt at reducing PPI dose.  #3.  Ventral hernia.  She is not have any symptoms but eventually she will need to have it fixed.  Will refer to her surgeons at [facility] surgery when she is ready.  #4.  Right costal/subcostal pain appears to be musculoskeletal.  I wonder if it is related to abnormal posture.  #5.  Weight gain and deconditioning.  Patient encouraged to gradually increase physical activity and must watch calorie intake and try to quit drinking sodas.   Plan:  Fecal calprotectin in 5 weeks. Discontinue Imodium. Patient advised to immediately cut soda intake to 2/day and eventually stop it. Patient advised to walk every day.  She can walk for few minutes each time but more than once daily.  She needs to gradually increase physical activity as tolerated. Hydrocodone/acetaminophen 5/325 1  tablet p.o. twice daily as needed. Patient will call with progress report in 2 weeks regarding right subcostal pain. Office visit in 3 months.

## 2021-03-31 NOTE — Patient Instructions (Signed)
And daily physical activity as discussed.  Can start with 10 to 15 minutes each time and gradually duration as stamina improves. Remember not to drink too more than 2 soft drinks per day; eventually he should not drink any. Fecal calprotectin to be done in fourth week of May 2022. Notify if right costal subcostal pain persists.

## 2021-04-13 ENCOUNTER — Other Ambulatory Visit (HOSPITAL_COMMUNITY): Payer: Self-pay | Admitting: Otolaryngology

## 2021-04-13 ENCOUNTER — Other Ambulatory Visit: Payer: Self-pay | Admitting: Otolaryngology

## 2021-04-13 ENCOUNTER — Ambulatory Visit (HOSPITAL_COMMUNITY): Payer: 59

## 2021-04-13 DIAGNOSIS — H918X9 Other specified hearing loss, unspecified ear: Secondary | ICD-10-CM

## 2021-04-25 ENCOUNTER — Other Ambulatory Visit (INDEPENDENT_AMBULATORY_CARE_PROVIDER_SITE_OTHER): Payer: Self-pay | Admitting: Gastroenterology

## 2021-04-25 DIAGNOSIS — R7989 Other specified abnormal findings of blood chemistry: Secondary | ICD-10-CM

## 2021-04-28 ENCOUNTER — Ambulatory Visit (HOSPITAL_COMMUNITY): Admission: RE | Admit: 2021-04-28 | Payer: 59 | Source: Ambulatory Visit

## 2021-04-28 ENCOUNTER — Encounter (HOSPITAL_COMMUNITY): Payer: Self-pay

## 2021-05-05 ENCOUNTER — Other Ambulatory Visit (INDEPENDENT_AMBULATORY_CARE_PROVIDER_SITE_OTHER): Payer: Self-pay | Admitting: *Deleted

## 2021-05-05 DIAGNOSIS — K50818 Crohn's disease of both small and large intestine with other complication: Secondary | ICD-10-CM

## 2021-05-08 ENCOUNTER — Other Ambulatory Visit: Payer: Self-pay

## 2021-05-08 ENCOUNTER — Ambulatory Visit (HOSPITAL_COMMUNITY)
Admission: RE | Admit: 2021-05-08 | Discharge: 2021-05-08 | Disposition: A | Discharge status: Home / Self Care | Payer: 59 | Source: Ambulatory Visit | Attending: Otolaryngology | Admitting: Otolaryngology

## 2021-05-08 DIAGNOSIS — H918X9 Other specified hearing loss, unspecified ear: Secondary | ICD-10-CM | POA: Diagnosis present

## 2021-05-08 MED ORDER — GADOBUTROL 1 MMOL/ML IV SOLN
7.0000 mL | Freq: Once | INTRAVENOUS | Status: AC | PRN
  Administered 2021-05-08: 7 mL via INTRAVENOUS

## 2021-05-15 LAB — CALPROTECTIN: Calprotectin: 106 mcg/g

## 2021-05-19 ENCOUNTER — Other Ambulatory Visit (INDEPENDENT_AMBULATORY_CARE_PROVIDER_SITE_OTHER): Payer: Self-pay

## 2021-05-19 MED ORDER — ONDANSETRON 8 MG PO TBDP: 8.0000 mg | ORAL_TABLET | Freq: Three times a day (TID) | ORAL | 2 refills | Status: DC | PRN

## 2021-05-19 NOTE — Progress Notes (Signed)
Medication sent to pharmacy  

## 2021-07-14 ENCOUNTER — Ambulatory Visit (INDEPENDENT_AMBULATORY_CARE_PROVIDER_SITE_OTHER): Payer: 59 | Admitting: Internal Medicine

## 2021-07-14 ENCOUNTER — Ambulatory Visit (INDEPENDENT_AMBULATORY_CARE_PROVIDER_SITE_OTHER): Payer: 59 | Admitting: Gastroenterology

## 2021-07-14 ENCOUNTER — Other Ambulatory Visit: Payer: Self-pay

## 2021-07-14 ENCOUNTER — Encounter (INDEPENDENT_AMBULATORY_CARE_PROVIDER_SITE_OTHER): Payer: Self-pay | Admitting: Internal Medicine

## 2021-07-14 ENCOUNTER — Ambulatory Visit (INDEPENDENT_AMBULATORY_CARE_PROVIDER_SITE_OTHER): Payer: 59 | Admitting: General Surgery

## 2021-07-14 ENCOUNTER — Encounter: Payer: Self-pay | Admitting: General Surgery

## 2021-07-14 VITALS — BP 134/76 | HR 95 | Temp 99.2°F | Ht 67.0 in | Wt 212.2 lb

## 2021-07-14 VITALS — BP 102/71 | HR 97 | Temp 98.2°F | Resp 14 | Ht 67.0 in | Wt 213.0 lb

## 2021-07-14 DIAGNOSIS — K5080 Crohn's disease of both small and large intestine without complications: Secondary | ICD-10-CM

## 2021-07-14 DIAGNOSIS — K432 Incisional hernia without obstruction or gangrene: Secondary | ICD-10-CM | POA: Diagnosis not present

## 2021-07-14 DIAGNOSIS — K219 Gastro-esophageal reflux disease without esophagitis: Secondary | ICD-10-CM

## 2021-07-14 DIAGNOSIS — R945 Abnormal results of liver function studies: Secondary | ICD-10-CM

## 2021-07-14 DIAGNOSIS — K580 Irritable bowel syndrome with diarrhea: Secondary | ICD-10-CM

## 2021-07-14 DIAGNOSIS — R7989 Other specified abnormal findings of blood chemistry: Secondary | ICD-10-CM | POA: Insufficient documentation

## 2021-07-14 MED ORDER — HYDROCODONE-ACETAMINOPHEN 5-325 MG PO TABS: 1.0000 | ORAL_TABLET | Freq: Two times a day (BID) | ORAL | 0 refills | Status: DC | PRN

## 2021-07-14 MED ORDER — HUMIRA (2 SYRINGE) 40 MG/0.4ML ~~LOC~~ PSKT: 40.0000 mg | PREFILLED_SYRINGE | SUBCUTANEOUS | 5 refills | Status: DC

## 2021-07-14 NOTE — Progress Notes (Signed)
Presenting complaint;  Follow for Crohn's disease, GERD and IBS. Patient has copy of blood work revealing elevated transaminases.  Database and subjective:  Patient is 60 year old Caucasian female who is here for scheduled visit.  She was last seen on 03/31/2021.  She has a history of small and large bowel Crohn's disease with right hemicolectomy several years ago and she also has a history of diverticulitis for which she had resection of sigmoid colon, history of IBS as well as GERD.  Her last visit she was also found to have ventral hernia.  She wanted to be referred back to [facility] surgery.  She was seen earlier today by Dr. Arnoldo Morale and is scheduled for surgery for this hernia next month.  Patient states she is doing well.  She says she has been eating homerun tomatoes and cucumber and not having any problems.  On most days she has 2 formed stools.  She is taking dicyclomine 20 mg in the morning and 10 mg in the evening.  She is not having any side effects.  She denies abdominal pain melena or rectal bleeding.  Her appetite is very good.  She says heartburn is well controlled with PPI.  She denies dysphagia. She states she has been under a lot of stress.  Her husband is having health issues and her mother recently developed stress fracture involving her foot and was hospitalized. Patient had blood work by Dr. Quillian Quince on 07/01/2021 and her AST was 97 and ALT was 100.  She has a history of fatty liver but her transaminases have not been this high.  She states she does not trust the results. She has received hepatitis a and B vaccination. She remains on anticoagulant because of history of DVT left leg 2 years ago.  Current Medications: Outpatient Encounter Medications as of 07/14/2021  Medication Sig   Adalimumab (HUMIRA) 40 MG/0.4ML PSKT Inject 40 mg into the skin every 14 (fourteen) days. Patient will start the maintenance Tuesday February 22,2022   albuterol (VENTOLIN HFA) 108 (90 Base)  MCG/ACT inhaler SMARTSIG:2 Puff(s) By Mouth Every 4 Hours PRN   Cholecalciferol (VITAMIN D3) 50 MCG (2000 UT) TABS TAKE 1 TABLET BY MOUTH EVERY DAY   dicyclomine (BENTYL) 10 MG capsule Take 1 capsule (10 mg total) by mouth 3 (three) times daily as needed for spasms.   HYDROcodone-acetaminophen (NORCO/VICODIN) 5-325 MG tablet Take 1 tablet by mouth 2 (two) times daily as needed for moderate pain.   mesalamine (PENTASA) 500 MG CR capsule Take 1,000 mg by mouth 4 (four) times daily.    omeprazole (PRILOSEC) 40 MG capsule Take 40 mg by mouth daily.   ondansetron (ZOFRAN ODT) 8 MG disintegrating tablet Take 1 tablet (8 mg total) by mouth every 8 (eight) hours as needed for nausea or vomiting.   potassium chloride (MICRO-K) 10 MEQ CR capsule Take 10 mEq by mouth 2 (two) times daily.    Probiotic Product (PROBIOTIC PO) Take by mouth daily.   rivaroxaban (XARELTO) 20 MG TABS tablet Take 20 mg by mouth daily with supper.   valsartan-hydrochlorothiazide (DIOVAN-HCT) 160-12.5 MG per tablet Take 1 tablet by mouth daily.    [DISCONTINUED] omeprazole (PRILOSEC) 20 MG capsule Take 1 capsule (20 mg total) by mouth daily. (Patient taking differently: Take 40 mg by mouth daily.)   Ergocalciferol 50 MCG (2000 UT) CAPS Take 1 each by mouth daily.   No facility-administered encounter medications on file as of 07/14/2021.     Objective: Blood pressure 134/76, pulse 95,  temperature 99.2 F (37.3 C), temperature source Oral, height 5' 7"  (1.702 m), weight 212 lb 3.2 oz (96.3 kg). Patient is alert and in no acute distress. Conjunctiva is pink. Sclera is nonicteric Oropharyngeal mucosa is normal. No neck masses or thyromegaly noted. Cardiac exam with regular rhythm normal S1 and S2. No murmur or gallop noted. Lungs are clear to auscultation. Abdomen is full.  She has low midline scar.  She has ventral hernia in mid abdomen above level of umbilicus.  It is much more easily palpable when she is upright.  No  organomegaly masses or tenderness noted. No LE edema or clubbing noted. 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  Labs/studies Results:   CBC Latest Ref Rng & Units 03/10/2021 10/28/2020 06/27/2020  WBC 3.8 - 10.8 Thousand/uL 5.6 9.4 9.7  Hemoglobin 11.7 - 15.5 g/dL 13.4 14.9 12.6  Hematocrit 35.0 - 45.0 % 38.9 41.4 36.6  Platelets 140 - 400 Thousand/uL 227 248 430(H)    CMP Latest Ref Rng & Units 03/10/2021 10/28/2020 06/27/2020  Glucose 65 - 139 mg/dL 115 118(H) 87  BUN 7 - 25 mg/dL 13 14 21   Creatinine 0.50 - 0.99 mg/dL 0.77 0.81 1.12(H)  Sodium 135 - 146 mmol/L 142 141 137  Potassium 3.5 - 5.3 mmol/L 4.0 3.7 4.1  Chloride 98 - 110 mmol/L 108 104 102  CO2 20 - 32 mmol/L 24 24 22   Calcium 8.6 - 10.4 mg/dL 8.5(L) 9.1 9.2  Total Protein 6.1 - 8.1 g/dL 5.7(L) 6.1 6.2  Total Bilirubin 0.2 - 1.2 mg/dL 0.4 0.6 0.5  Alkaline Phos 38 - 126 U/L - - -  AST 10 - 35 U/L 34 18 14  ALT 6 - 29 U/L 38(H) 21 16    Hepatic Function Latest Ref Rng & Units 03/10/2021 10/28/2020 06/27/2020  Total Protein 6.1 - 8.1 g/dL 5.7(L) 6.1 6.2  Albumin 3.5 - 5.0 g/dL - - -  AST 10 - 35 U/L 34 18 14  ALT 6 - 29 U/L 38(H) 21 16  Alk Phosphatase 38 - 126 U/L - - -  Total Bilirubin 0.2 - 1.2 mg/dL 0.4 0.6 0.5  Bilirubin, Direct 0.0 - 0.3 mg/dL - - -    Lab Results  Component Value Date   CRP 5.5 03/10/2021    Lab data from 07/01/2021 WBC 6.8, H&H 14.6 and 42.4. MCV 103. Platelet count 208K.  Glucose 89, BUN 12, creatinine 0.80 Serum sodium 143, potassium 4.5, chloride 103, CO2 21 Serum calcium 9.1 10.5,8p 85, AST 97, ALT 100 total protein 6.3 with albumin of 3.7 TSH 2.730.  Lab data from 05/08/2021  Fecal calprotectin was 106.  Assessment:  #1.  Crohn's disease involving small and large bowel.  Her disease cannot be controlled with oral mesalamine.  She was begun on Humira/adalimumab in January this year.  She appears to be in remission.  Therefore oral mesalamine could be  discontinued.  #2.  GERD.  Symptoms well controlled with therapy.  Would consider dropping omeprazole dose at some point in future.  #3.  IBS.  She is on low-dose dicyclomine and appears to be doing well.  #4.  Macrocytosis.  MCV is mildly elevated.  She has had her distal small bowel removed.  Therefore need to rule out B12 deficiency.  #5.  Elevated transaminases.  She has history of fatty liver but her transaminases have never been this high.  She is on multiple imaging studies and has not had any structural abnormality to  liver biliary system.  LFTs will be repeated before considering further work-up.  #6.  History of low back pain.  Patient has been using narcotic sparingly.  She has been advised not to take NSAIDs.   Plan:   Patient will go to the lab for CBC, serum B12 level and LFTs and HCV antibody next week. Patient will continue Humira/adalimumab at a dose of 40 mg subcu every 14 days. Discontinue Pentasa. Hydrocodone 5/325 1 tablet p.o. twice daily as needed for moderate to severe pain.  Prescription given for 20 doses without refill. Please note patient's overdose risk score is 140. Office visit in 6 months.

## 2021-07-14 NOTE — Patient Instructions (Signed)
Physician will call with results of blood work

## 2021-07-15 NOTE — Progress Notes (Signed)
Elizabeth [name] [id number]; Nov 26, 1961   HPI Patient is a 60 year old white female who referred herself to my care for evaluation and treatment of an incisional hernia.  Her.  [facility] doctor is Dr. Quillian Quince.  Patient is status post a laparoscopic partial colectomy in 2016 for diverticulitis.  She has over the past couple years had a swelling just above her umbilicus deep to the incision line.  She has a known incisional hernia but states that recently is starting to increase in size and causing her discomfort.  She denies any nausea or vomiting.  Patient on Xarelto for history of DVTs. Past Medical History:  Diagnosis Date   Bronchitis    C. difficile diarrhea    Cough 01/28/2014   upper airway cough syndrome   Crohn's disease ([place])    Diverticulitis    Elevated transaminase level    GERD (gastroesophageal reflux disease)    Hypertension    Obesity    Pneumonia 1/15   Tobacco abuse     Past Surgical History:  Procedure Laterality Date   ABDOMINAL HYSTERECTOMY  1980   APPENDECTOMY     COLONOSCOPY     COLONOSCOPY N/A 08/16/2014   Procedure: COLONOSCOPY;  Surgeon: Rogene Houston, MD;  Location: [facility];  Service: Endoscopy;  Laterality: N/A;  1200-rescheduled to 9/4 @ 10:45 Ann notified pt   ESOPHAGOGASTRODUODENOSCOPY  06/16/2012   Procedure: ESOPHAGOGASTRODUODENOSCOPY (EGD);  Surgeon: Rogene Houston, MD;  Location: [facility];  Service: Endoscopy;  Laterality: N/A;  10:30 AM   hemocolectomy  2005   right   LAPAROSCOPIC SIGMOID COLECTOMY N/A 01/07/2015   Procedure: LOW ANTERIOR COLON RESECTION;  Surgeon: Fanny Skates, MD;  Location: [facility];  Service: General;  Laterality: N/A;   SHOULDER ARTHROSCOPY WITH LABRAL REPAIR Right 01/20/2016   Procedure: SHOULDER ARTHROSCOPY WITH LABRAL REPAIR;  Surgeon: Meredith Pel, MD;  Location: [facility];  Service: Orthopedics;  Laterality: Right;    Family History  Problem Relation Age of Onset   Healthy Mother    Lung cancer Father         smoked   Allergic Disorder Brother    Healthy Daughter     Current Outpatient Medications on File Prior to Visit  Medication Sig Dispense Refill   albuterol (VENTOLIN HFA) 108 (90 Base) MCG/ACT inhaler SMARTSIG:2 Puff(s) By Mouth Every 4 Hours PRN     Cholecalciferol (VITAMIN D3) 50 MCG (2000 UT) TABS TAKE 1 TABLET BY MOUTH EVERY DAY 90 tablet 1   dicyclomine (BENTYL) 10 MG capsule Take 1 capsule (10 mg total) by mouth 3 (three) times daily as needed for spasms. 60 capsule 5   mesalamine (PENTASA) 500 MG CR capsule Take 1,000 mg by mouth 4 (four) times daily.      ondansetron (ZOFRAN ODT) 8 MG disintegrating tablet Take 1 tablet (8 mg total) by mouth every 8 (eight) hours as needed for nausea or vomiting. 30 tablet 2   potassium chloride (MICRO-K) 10 MEQ CR capsule Take 10 mEq by mouth 2 (two) times daily.      rivaroxaban (XARELTO) 20 MG TABS tablet Take 20 mg by mouth daily with supper.     valsartan-hydrochlorothiazide (DIOVAN-HCT) 160-12.5 MG per tablet Take 1 tablet by mouth daily.      Probiotic Product (PROBIOTIC PO) Take by mouth daily.     No current facility-administered medications on file prior to visit.    Allergies  Allergen Reactions   Contrast Media [Iodinated Diagnostic Agents] Hives  and Rash   Penicillins Hives and Rash             Social History   Substance and Sexual Activity  Alcohol Use Not Currently   Alcohol/week: 0.0 standard drinks   Comment: occ    Social History   Tobacco Use  Smoking Status Every Day   Packs/day: 0.50   Years: 40.00   Pack years: 20.00   Types: Cigarettes  Smokeless Tobacco Never  Tobacco Comments   increase smoking , very nervous    Review of Systems  Constitutional: Negative.   HENT: Negative.    Eyes: Negative.   Respiratory: Negative.    Cardiovascular: Negative.   Gastrointestinal:  Positive for abdominal pain and heartburn.  Genitourinary: Negative.   Musculoskeletal: Negative.   Skin: Negative.    Neurological: Negative.   Endo/Heme/Allergies:  Bruises/bleeds easily.  Psychiatric/Behavioral: Negative.     Objective   Vitals:   07/14/21 1128  BP: 102/71  Pulse: 97  Resp: 14  Temp: 98.2 F (36.8 C)  SpO2: 98%    Physical Exam Vitals reviewed.  Constitutional:      Appearance: Normal appearance. She is not ill-appearing.  HENT:     Head: Normocephalic and atraumatic.  Cardiovascular:     Rate and Rhythm: Normal rate and regular rhythm.     Heart sounds: Normal heart sounds. No murmur heard.   No friction rub. No gallop.  Pulmonary:     Effort: Pulmonary effort is normal. No respiratory distress.     Breath sounds: Normal breath sounds. No stridor. No wheezing, rhonchi or rales.  Abdominal:     General: Bowel sounds are normal. There is no distension.     Palpations: Abdomen is soft. There is no mass.     Tenderness: There is no abdominal tenderness. There is no guarding or rebound.     Hernia: A hernia is present.     Comments:   Reducible moderate size incisional hernia just superior to the umbilicus.  Skin:    General: Skin is warm and dry.  Neurological:     Mental Status: She is alert and oriented to person, place, and time.   CT scan of the abdomen from 200019 reviewed.  Incisional hernia present at that time. Assessment  Incisional hernia, history of DVT on Xarelto Plan  Patient is scheduled for an incisional herniorrhaphy with mesh on 08/24/2021.  The risks and benefits of the procedure including bleeding, infection, mesh use, and the possibility of recurrence of the hernia were fully explained to the patient, who gave informed consent.  She will stop her Xarelto 2 days prior to the surgery.

## 2021-07-23 ENCOUNTER — Other Ambulatory Visit (INDEPENDENT_AMBULATORY_CARE_PROVIDER_SITE_OTHER): Payer: Self-pay | Admitting: Internal Medicine

## 2021-07-23 DIAGNOSIS — R7989 Other specified abnormal findings of blood chemistry: Secondary | ICD-10-CM

## 2021-07-23 DIAGNOSIS — R945 Abnormal results of liver function studies: Secondary | ICD-10-CM

## 2021-07-27 ENCOUNTER — Other Ambulatory Visit (INDEPENDENT_AMBULATORY_CARE_PROVIDER_SITE_OTHER): Payer: Self-pay | Admitting: *Deleted

## 2021-07-27 ENCOUNTER — Other Ambulatory Visit (INDEPENDENT_AMBULATORY_CARE_PROVIDER_SITE_OTHER): Payer: Self-pay

## 2021-07-27 DIAGNOSIS — R7989 Other specified abnormal findings of blood chemistry: Secondary | ICD-10-CM

## 2021-07-27 DIAGNOSIS — R945 Abnormal results of liver function studies: Secondary | ICD-10-CM

## 2021-07-28 LAB — HEPATIC FUNCTION PANEL
AG Ratio: 1.4 (calc) (ref 1.0–2.5)
ALT: 71 U/L — ABNORMAL HIGH (ref 6–29)
AST: 60 U/L — ABNORMAL HIGH (ref 10–35)
Albumin: 3.7 g/dL (ref 3.6–5.1)
Alkaline phosphatase (APISO): 80 U/L (ref 37–153)
Bilirubin, Direct: 0.2 mg/dL (ref 0.0–0.2)
Globulin: 2.7 g/dL (ref 1.9–3.7)
Indirect Bilirubin: 0.4 mg/dL (ref 0.2–1.2)
Total Bilirubin: 0.6 mg/dL (ref 0.2–1.2)
Total Protein: 6.4 g/dL (ref 6.1–8.1)

## 2021-07-28 LAB — VITAMIN B12: Vitamin B-12: 314 pg/mL (ref 200–1100)

## 2021-07-28 LAB — HCV RNA,QUANTITATIVE REAL TIME PCR
HCV Quantitative Log: <1.18 Log IU/mL
HCV RNA, PCR, QN: <15 IU/mL

## 2021-07-28 LAB — HEPATITIS C ANTIBODY
Hepatitis C Ab: REACTIVE — AB
SIGNAL TO CUT-OFF: 28 — ABNORMAL HIGH (ref <1.00)

## 2021-07-28 NOTE — H&P (Signed)
Elizabeth [name] [id number]; December 05, 1961   HPI Patient is a 60 year old white female who referred herself to my care for evaluation and treatment of an incisional hernia.  Her.  [facility] doctor is Dr. Quillian Quince.  Patient is status post a laparoscopic partial colectomy in 2016 for diverticulitis.  She has over the past couple years had a swelling just above her umbilicus deep to the incision line.  She has a known incisional hernia but states that recently is starting to increase in size and causing her discomfort.  She denies any nausea or vomiting.  Patient on Xarelto for history of DVTs. Past Medical History:  Diagnosis Date   Bronchitis    C. difficile diarrhea    Cough 01/28/2014   upper airway cough syndrome   Crohn's disease ([place])    Diverticulitis    Elevated transaminase level    GERD (gastroesophageal reflux disease)    Hypertension    Obesity    Pneumonia 1/15   Tobacco abuse     Past Surgical History:  Procedure Laterality Date   ABDOMINAL HYSTERECTOMY  1980   APPENDECTOMY     COLONOSCOPY     COLONOSCOPY N/A 08/16/2014   Procedure: COLONOSCOPY;  Surgeon: Rogene Houston, MD;  [facility];  Service: Endoscopy;  Laterality: N/A;  1200-rescheduled to 9/4 @ 10:45 Ann notified pt   ESOPHAGOGASTRODUODENOSCOPY  06/16/2012   Procedure: ESOPHAGOGASTRODUODENOSCOPY (EGD);  Surgeon: Rogene Houston, MD;  [facility];  Service: Endoscopy;  Laterality: N/A;  10:30 AM   hemocolectomy  2005   right   LAPAROSCOPIC SIGMOID COLECTOMY N/A 01/07/2015   Procedure: LOW ANTERIOR COLON RESECTION;  Surgeon: Fanny Skates, MD;  Location: [facility];  Service: General;  Laterality: N/A;   SHOULDER ARTHROSCOPY WITH LABRAL REPAIR Right 01/20/2016   Procedure: SHOULDER ARTHROSCOPY WITH LABRAL REPAIR;  Surgeon: Meredith Pel, MD;  Location: [place];  Service: Orthopedics;  Laterality: Right;    Family History  Problem Relation Age of Onset   Healthy Mother    Lung cancer Father         smoked   Allergic Disorder Brother    Healthy Daughter     Current Outpatient Medications on File Prior to Visit  Medication Sig Dispense Refill   albuterol (VENTOLIN HFA) 108 (90 Base) MCG/ACT inhaler SMARTSIG:2 Puff(s) By Mouth Every 4 Hours PRN     Cholecalciferol (VITAMIN D3) 50 MCG (2000 UT) TABS TAKE 1 TABLET BY MOUTH EVERY DAY 90 tablet 1   dicyclomine (BENTYL) 10 MG capsule Take 1 capsule (10 mg total) by mouth 3 (three) times daily as needed for spasms. 60 capsule 5   mesalamine (PENTASA) 500 MG CR capsule Take 1,000 mg by mouth 4 (four) times daily.      ondansetron (ZOFRAN ODT) 8 MG disintegrating tablet Take 1 tablet (8 mg total) by mouth every 8 (eight) hours as needed for nausea or vomiting. 30 tablet 2   potassium chloride (MICRO-K) 10 MEQ CR capsule Take 10 mEq by mouth 2 (two) times daily.      rivaroxaban (XARELTO) 20 MG TABS tablet Take 20 mg by mouth daily with supper.     valsartan-hydrochlorothiazide (DIOVAN-HCT) 160-12.5 MG per tablet Take 1 tablet by mouth daily.      Probiotic Product (PROBIOTIC PO) Take by mouth daily.     No current facility-administered medications on file prior to visit.    Allergies  Allergen Reactions   Contrast Media [Iodinated Diagnostic Agents] Hives  and Rash   Penicillins Hives and Rash             Social History   Substance and Sexual Activity  Alcohol Use Not Currently   Alcohol/week: 0.0 standard drinks   Comment: occ    Social History   Tobacco Use  Smoking Status Every Day   Packs/day: 0.50   Years: 40.00   Pack years: 20.00   Types: Cigarettes  Smokeless Tobacco Never  Tobacco Comments   increase smoking , very nervous    Review of Systems  Constitutional: Negative.   HENT: Negative.    Eyes: Negative.   Respiratory: Negative.    Cardiovascular: Negative.   Gastrointestinal:  Positive for abdominal pain and heartburn.  Genitourinary: Negative.   Musculoskeletal: Negative.   Skin: Negative.    Neurological: Negative.   Endo/Heme/Allergies:  Bruises/bleeds easily.  Psychiatric/Behavioral: Negative.     Objective   Vitals:   07/14/21 1128  BP: 102/71  Pulse: 97  Resp: 14  Temp: 98.2 F (36.8 C)  SpO2: 98%    Physical Exam Vitals reviewed.  Constitutional:      Appearance: Normal appearance. She is not ill-appearing.  HENT:     Head: Normocephalic and atraumatic.  Cardiovascular:     Rate and Rhythm: Normal rate and regular rhythm.     Heart sounds: Normal heart sounds. No murmur heard.   No friction rub. No gallop.  Pulmonary:     Effort: Pulmonary effort is normal. No respiratory distress.     Breath sounds: Normal breath sounds. No stridor. No wheezing, rhonchi or rales.  Abdominal:     General: Bowel sounds are normal. There is no distension.     Palpations: Abdomen is soft. There is no mass.     Tenderness: There is no abdominal tenderness. There is no guarding or rebound.     Hernia: A hernia is present.     Comments:   Reducible moderate size incisional hernia just superior to the umbilicus.  Skin:    General: Skin is warm and dry.  Neurological:     Mental Status: She is alert and oriented to person, place, and time.   CT scan of the abdomen from 200019 reviewed.  Incisional hernia present at that time. Assessment  Incisional hernia, history of DVT on Xarelto Plan  Patient is scheduled for an incisional herniorrhaphy with mesh on 08/24/2021.  The risks and benefits of the procedure including bleeding, infection, mesh use, and the possibility of recurrence of the hernia were fully explained to the patient, who gave informed consent.  She will stop her Xarelto 2 days prior to the surgery.

## 2021-07-29 ENCOUNTER — Telehealth (INDEPENDENT_AMBULATORY_CARE_PROVIDER_SITE_OTHER): Payer: Self-pay | Admitting: *Deleted

## 2021-07-29 LAB — HEPATITIS C RNA QUANTITATIVE
HCV Quantitative Log: <1.18 log IU/mL
HCV RNA, PCR, QN: <15 IU/mL

## 2021-07-29 LAB — HEPATITIS A ANTIBODY, TOTAL: Hepatitis A AB,Total: NONREACTIVE

## 2021-07-29 LAB — HEPATITIS B SURFACE ANTIBODY,QUALITATIVE: Hep B S Ab: REACTIVE — AB

## 2021-07-29 NOTE — Telephone Encounter (Signed)
Pt left message on vm that she wanted to know if lab results are back. I checked and we still waiting on one of the test to come back. Left message to return call to discuss with pt.

## 2021-07-30 NOTE — Telephone Encounter (Signed)
Dr Laural Golden talked with pt about labs.

## 2021-08-18 NOTE — Patient Instructions (Signed)
Elizabeth [name]  08/18/2021     @PREFPERIOPPHARMACY @   Your procedure is scheduled on  08/24/2021.   Report to [facility] at  [phone]  A.M.   Call this number if you have problems the morning of surgery:  [phone]   Remember:  Do not eat or drink after midnight.      Take these medicines the morning of surgery with A SIP OF WATER     hydrocodone (If needed), pentasa, prilosec, zofran     Do not wear jewelry, make-up or nail polish.  Do not wear lotions, powders, or perfumes, or deodorant.  Do not shave 48 hours prior to surgery.  Men may shave face and neck.  Do not bring valuables to the hospital.  [facility] is not responsible for any belongings or valuables.  Contacts, dentures or bridgework may not be worn into surgery.  Leave your suitcase in the car.  After surgery it may be brought to your room.  For patients admitted to the hospital, discharge time will be determined by your treatment team.  Patients discharged the day of surgery will not be allowed to drive home and must have someone with them for 24 hours.    Special instructions:   DO NOT smoke tobacco or vape for 24 hours before your procedure.  Please read over the following fact sheets that you were given. Coughing and Deep Breathing, Surgical Site Infection Prevention, Anesthesia Post-op Instructions, and Care and Recovery After Surgery      Open Hernia Repair, Adult, Care After What can I expect after the procedure? After the procedure, it is common to have: Mild discomfort. Slight bruising. Mild swelling. Pain in the belly (abdomen). A small amount of blood from the cut from surgery (incision). Follow these instructions at home: Your doctor may give you more specific instructions. If you have problems, call your doctor. Medicines Take over-the-counter and prescription medicines only as told by your doctor. If told, take steps to prevent problems with pooping (constipation). You may need  to: Drink enough fluid to keep your pee (urine) pale yellow. Take medicines. You will be told what medicines to take. Eat foods that are high in fiber. These include beans, whole grains, and fresh fruits and vegetables. Limit foods that are high in fat and sugar. These include fried or sweet foods. Ask your doctor if you should avoid driving or using machines while you are taking your medicine. Incision care  Follow instructions from your doctor about how to take care of your incision. Make sure you: Wash your hands with soap and water for at least 20 seconds before and after you change your bandage (dressing). If you cannot use soap and water, use hand sanitizer. Change your bandage. Leave stitches or skin glue in place for at least 2 weeks. Leave tape strips alone unless you are told to take them off. You may trim the edges of the tape strips if they curl up. Check your incision every day for signs of infection. Check for: More redness, swelling, or pain. More fluid or blood. Warmth. Pus or a bad smell. Wear loose, soft clothing while your incision heals. Activity  Rest as told by your doctor. Do not lift anything that is heavier than 10 lb (4.5 kg), or the limit that you are told. Do not play contact sports until your doctor says that this is safe. If you were given a sedative during your procedure, do not drive  or use machines until your doctor says that it is safe. A sedative is a medicine that helps you relax. Return to your normal activities when your doctor says that it is safe. General instructions Do not take baths, swim, or use a hot tub. Ask your doctor about taking showers or sponge baths. Hold a pillow over your belly when you cough or sneeze. This helps with pain. Do not smoke or use any products that contain nicotine or tobacco. If you need help quitting, ask your doctor. Keep all follow-up visits. Contact a doctor if: You have any of these signs of infection in or  around your incision: More redness, swelling, or pain. More fluid or blood. Warmth. Pus. A bad smell. You have a fever or chills. You have blood in your poop (stool). You have not pooped (had a bowel movement) in 2-3 days. Medicine does not help your pain. Get help right away if: You have chest pain, or you are short of breath. You feel faint or light-headed. You have very bad pain. You vomit and your pain is worse. You have pain, swelling, or redness in a leg. These symptoms may be an emergency. Get help right away. Call your local emergency services (911 in the [place] Do not wait to see if the symptoms will go away. Do not drive yourself to the hospital. Summary After this procedure, it is common to have mild discomfort, slight bruising, and mild swelling. Follow instructions from your doctor about how to take care of your cut from surgery (incision). Check every day for signs of infection. Do not lift heavy objects or play contact sports until your doctor says it is safe. Return to your normal activities as told by your doctor. This information is not intended to replace advice given to you by your health care provider. Make sure you discuss any questions you have with your health care provider. Document Revised: 07/14/2020 Document Reviewed: 07/14/2020 Elsevier Patient Education  2022 [employer] Anesthesia, Adult, Care After This sheet gives you information about how to care for yourself after your procedure. Your health care provider may also give you more specific instructions. If you have problems or questions, contact your health care provider. What can I expect after the procedure? After the procedure, the following side effects are common: Pain or discomfort at the IV site. Nausea. Vomiting. Sore throat. Trouble concentrating. Feeling cold or chills. Feeling weak or tired. Sleepiness and fatigue. Soreness and body aches. These side effects can affect parts  of the body that were not involved in surgery. Follow these instructions at home: For the time period you were told by your health care provider:  Rest. Do not participate in activities where you could fall or become injured. Do not drive or use machinery. Do not drink alcohol. Do not take sleeping pills or medicines that cause drowsiness. Do not make important decisions or sign legal documents. Do not take care of children on your own. Eating and drinking Follow any instructions from your health care provider about eating or drinking restrictions. When you feel hungry, start by eating small amounts of foods that are soft and easy to digest (bland), such as toast. Gradually return to your regular diet. Drink enough fluid to keep your urine pale yellow. If you vomit, rehydrate by drinking water, juice, or clear broth. General instructions If you have sleep apnea, surgery and certain medicines can increase your risk for breathing problems. Follow instructions from your health care provider about  wearing your sleep device: Anytime you are sleeping, including during daytime naps. While taking prescription pain medicines, sleeping medicines, or medicines that make you drowsy. Have a responsible adult stay with you for the time you are told. It is important to have someone help care for you until you are awake and alert. Return to your normal activities as told by your health care provider. Ask your health care provider what activities are safe for you. Take over-the-counter and prescription medicines only as told by your health care provider. If you smoke, do not smoke without supervision. Keep all follow-up visits as told by your health care provider. This is important. Contact a health care provider if: You have nausea or vomiting that does not get better with medicine. You cannot eat or drink without vomiting. You have pain that does not get better with medicine. You are unable to pass  urine. You develop a skin rash. You have a fever. You have redness around your IV site that gets worse. Get help right away if: You have difficulty breathing. You have chest pain. You have blood in your urine or stool, or you vomit blood. Summary After the procedure, it is common to have a sore throat or nausea. It is also common to feel tired. Have a responsible adult stay with you for the time you are told. It is important to have someone help care for you until you are awake and alert. When you feel hungry, start by eating small amounts of foods that are soft and easy to digest (bland), such as toast. Gradually return to your regular diet. Drink enough fluid to keep your urine pale yellow. Return to your normal activities as told by your health care provider. Ask your health care provider what activities are safe for you. This information is not intended to replace advice given to you by your health care provider. Make sure you discuss any questions you have with your health care provider. Document Revised: 08/14/2020 Document Reviewed: 03/13/2020 Elsevier Patient Education  2022 [employer].   How to Use Chlorhexidine for Bathing Chlorhexidine gluconate (CHG) is a germ-killing (antiseptic) solution that is used to clean the skin. It can get rid of the bacteria that normally live on the skin and can keep them away for about 24 hours. To clean your skin with CHG, you may be given: A CHG solution to use in the shower or as part of a sponge bath. A prepackaged cloth that contains CHG. Cleaning your skin with CHG may help lower the risk for infection: While you are staying in the intensive care unit of the hospital. If you have a vascular access, such as a central line, to provide short-term or long-term access to your veins. If you have a catheter to drain urine from your bladder. If you are on a ventilator. A ventilator is a machine that helps you breathe by moving air in and out of  your lungs. After surgery. What are the risks? Risks of using CHG include: A skin reaction. Hearing loss, if CHG gets in your ears and you have a perforated eardrum. Eye injury, if CHG gets in your eyes and is not rinsed out. The CHG product catching fire. Make sure that you avoid smoking and flames after applying CHG to your skin. Do not use CHG: If you have a chlorhexidine allergy or have previously reacted to chlorhexidine. On babies younger than 67 months of age. How to use CHG solution Use CHG only as told  by your health care provider, and follow the instructions on the label. Use the full amount of CHG as directed. Usually, this is one bottle. During a shower Follow these steps when using CHG solution during a shower (unless your health care provider gives you different instructions): Start the shower. Use your normal soap and shampoo to wash your face and hair. Turn off the shower or move out of the shower stream. Pour the CHG onto a clean washcloth. Do not use any type of brush or rough-edged sponge. Starting at your neck, lather your body down to your toes. Make sure you follow these instructions: If you will be having surgery, pay special attention to the part of your body where you will be having surgery. Scrub this area for at least 1 minute. Do not use CHG on your head or face. If the solution gets into your ears or eyes, rinse them well with water. Avoid your genital area. Avoid any areas of skin that have broken skin, cuts, or scrapes. Scrub your back and under your arms. Make sure to wash skin folds. Let the lather sit on your skin for 1-2 minutes or as long as told by your health care provider. Thoroughly rinse your entire body in the shower. Make sure that all body creases and crevices are rinsed well. Dry off with a clean towel. Do not put any substances on your body afterward--such as powder, lotion, or perfume--unless you are told to do so by your health care  provider. Only use lotions that are recommended by the manufacturer. Put on clean clothes or pajamas. If it is the night before your surgery, sleep in clean sheets.  During a sponge bath Follow these steps when using CHG solution during a sponge bath (unless your health care provider gives you different instructions): Use your normal soap and shampoo to wash your face and hair. Pour the CHG onto a clean washcloth. Starting at your neck, lather your body down to your toes. Make sure you follow these instructions: If you will be having surgery, pay special attention to the part of your body where you will be having surgery. Scrub this area for at least 1 minute. Do not use CHG on your head or face. If the solution gets into your ears or eyes, rinse them well with water. Avoid your genital area. Avoid any areas of skin that have broken skin, cuts, or scrapes. Scrub your back and under your arms. Make sure to wash skin folds. Let the lather sit on your skin for 1-2 minutes or as long as told by your health care provider. Using a different clean, wet washcloth, thoroughly rinse your entire body. Make sure that all body creases and crevices are rinsed well. Dry off with a clean towel. Do not put any substances on your body afterward--such as powder, lotion, or perfume--unless you are told to do so by your health care provider. Only use lotions that are recommended by the manufacturer. Put on clean clothes or pajamas. If it is the night before your surgery, sleep in clean sheets. How to use CHG prepackaged cloths Only use CHG cloths as told by your health care provider, and follow the instructions on the label. Use the CHG cloth on clean, dry skin. Do not use the CHG cloth on your head or face unless your health care provider tells you to. When washing with the CHG cloth: Avoid your genital area. Avoid any areas of skin that have broken skin, cuts, or  scrapes. Before surgery Follow these steps  when using a CHG cloth to clean before surgery (unless your health care provider gives you different instructions): Using the CHG cloth, vigorously scrub the part of your body where you will be having surgery. Scrub using a back-and-forth motion for 3 minutes. The area on your body should be completely wet with CHG when you are done scrubbing. Do not rinse. Discard the cloth and let the area air-dry. Do not put any substances on the area afterward, such as powder, lotion, or perfume. Put on clean clothes or pajamas. If it is the night before your surgery, sleep in clean sheets.  For general bathing Follow these steps when using CHG cloths for general bathing (unless your health care provider gives you different instructions). Use a separate CHG cloth for each area of your body. Make sure you wash between any folds of skin and between your fingers and toes. Wash your body in the following order, switching to a new cloth after each step: The front of your neck, shoulders, and chest. Both of your arms, under your arms, and your hands. Your stomach and groin area, avoiding the genitals. Your right leg and foot. Your left leg and foot. The back of your neck, your back, and your buttocks. Do not rinse. Discard the cloth and let the area air-dry. Do not put any substances on your body afterward--such as powder, lotion, or perfume--unless you are told to do so by your health care provider. Only use lotions that are recommended by the manufacturer. Put on clean clothes or pajamas. Contact a health care provider if: Your skin gets irritated after scrubbing. You have questions about using your solution or cloth. You swallow any chlorhexidine. Call your local poison control center (1-605-514-1429 in the [place] Get help right away if: Your eyes itch badly, or they become very red or swollen. Your skin itches badly and is red or swollen. Your hearing changes. You have trouble seeing. You have swelling or  tingling in your mouth or throat. You have trouble breathing. These symptoms may represent a serious problem that is an emergency. Do not wait to see if the symptoms will go away. Get medical help right away. Call your local emergency services (911 in the [place] Do not drive yourself to the hospital. Summary Chlorhexidine gluconate (CHG) is a germ-killing (antiseptic) solution that is used to clean the skin. Cleaning your skin with CHG may help to lower your risk for infection. You may be given CHG to use for bathing. It may be in a bottle or in a prepackaged cloth to use on your skin. Carefully follow your health care provider's instructions and the instructions on the product label. Do not use CHG if you have a chlorhexidine allergy. Contact your health care provider if your skin gets irritated after scrubbing. This information is not intended to replace advice given to you by your health care provider. Make sure you discuss any questions you have with your health care provider. Document Revised: 02/09/2021 Document Reviewed: 02/09/2021 Elsevier Patient Education  2022 [employer].

## 2021-08-20 ENCOUNTER — Encounter (HOSPITAL_COMMUNITY)
Admission: RE | Admit: 2021-08-20 | Discharge: 2021-08-20 | Disposition: A | Discharge status: Home / Self Care | Payer: 59 | Source: Ambulatory Visit | Attending: General Surgery | Admitting: General Surgery

## 2021-08-20 ENCOUNTER — Other Ambulatory Visit: Payer: Self-pay

## 2021-08-20 ENCOUNTER — Encounter (HOSPITAL_COMMUNITY): Payer: Self-pay

## 2021-08-20 DIAGNOSIS — Z01818 Encounter for other preprocedural examination: Secondary | ICD-10-CM | POA: Diagnosis not present

## 2021-08-20 LAB — BASIC METABOLIC PANEL
Anion gap: 10 (ref 5–15)
BUN: 16 mg/dL (ref 8–23)
CO2: 26 mmol/L (ref 22–32)
Calcium: 8.3 mg/dL — ABNORMAL LOW (ref 8.9–10.3)
Chloride: 104 mmol/L (ref 98–111)
Creatinine, Ser: 0.87 mg/dL (ref 0.44–1.00)
GFR, Estimated: >60 mL/min (ref >60)
Glucose, Bld: 103 mg/dL — ABNORMAL HIGH (ref 70–99)
Potassium: 3.2 mmol/L — ABNORMAL LOW (ref 3.5–5.1)
Sodium: 140 mmol/L (ref 135–145)

## 2021-08-24 ENCOUNTER — Encounter (HOSPITAL_COMMUNITY)
Admission: RE | Disposition: A | Discharge status: Home / Self Care | Payer: Self-pay | Source: Ambulatory Visit | Attending: General Surgery

## 2021-08-24 ENCOUNTER — Ambulatory Visit (HOSPITAL_COMMUNITY)
Admission: RE | Admit: 2021-08-24 | Discharge: 2021-08-24 | Disposition: A | Discharge status: Home / Self Care | Payer: 59 | Source: Ambulatory Visit | Attending: General Surgery | Admitting: General Surgery

## 2021-08-24 ENCOUNTER — Encounter (HOSPITAL_COMMUNITY): Payer: Self-pay | Admitting: General Surgery

## 2021-08-24 ENCOUNTER — Ambulatory Visit (HOSPITAL_COMMUNITY): Payer: 59 | Admitting: Anesthesiology

## 2021-08-24 DIAGNOSIS — Z86718 Personal history of other venous thrombosis and embolism: Secondary | ICD-10-CM | POA: Diagnosis not present

## 2021-08-24 DIAGNOSIS — Z91041 Radiographic dye allergy status: Secondary | ICD-10-CM | POA: Diagnosis not present

## 2021-08-24 DIAGNOSIS — Z9049 Acquired absence of other specified parts of digestive tract: Secondary | ICD-10-CM | POA: Diagnosis not present

## 2021-08-24 DIAGNOSIS — K432 Incisional hernia without obstruction or gangrene: Secondary | ICD-10-CM

## 2021-08-24 DIAGNOSIS — Z79899 Other long term (current) drug therapy: Secondary | ICD-10-CM | POA: Diagnosis not present

## 2021-08-24 DIAGNOSIS — F17210 Nicotine dependence, cigarettes, uncomplicated: Secondary | ICD-10-CM | POA: Insufficient documentation

## 2021-08-24 DIAGNOSIS — Z880 Allergy status to penicillin: Secondary | ICD-10-CM | POA: Diagnosis not present

## 2021-08-24 HISTORY — PX: INCISIONAL HERNIA REPAIR: SHX193

## 2021-08-24 SURGERY — REPAIR, HERNIA, INCISIONAL
Anesthesia: General

## 2021-08-24 MED ORDER — FENTANYL CITRATE PF 50 MCG/ML IJ SOSY
25.0000 ug | PREFILLED_SYRINGE | INTRAMUSCULAR | Status: DC | PRN
  Administered 2021-08-24: 50 ug via INTRAVENOUS
  Filled 2021-08-24: qty 1

## 2021-08-24 MED ORDER — ONDANSETRON HCL 4 MG/2ML IJ SOLN
4.0000 mg | Freq: Once | INTRAMUSCULAR | Status: AC | PRN
  Administered 2021-08-24: 4 mg via INTRAVENOUS
  Filled 2021-08-24: qty 2

## 2021-08-24 MED ORDER — HYDROCODONE-ACETAMINOPHEN 5-325 MG PO TABS: 1.0000 | ORAL_TABLET | ORAL | 0 refills | Status: DC | PRN

## 2021-08-24 MED ORDER — VANCOMYCIN HCL IN DEXTROSE 1-5 GM/200ML-% IV SOLN
INTRAVENOUS | Status: AC
  Filled 2021-08-24: qty 200

## 2021-08-24 MED ORDER — CHLORHEXIDINE GLUCONATE CLOTH 2 % EX PADS: 6.0000 | MEDICATED_PAD | Freq: Once | CUTANEOUS | Status: DC

## 2021-08-24 MED ORDER — ORAL CARE MOUTH RINSE: 15.0000 mL | Freq: Once | OROMUCOSAL | Status: AC

## 2021-08-24 MED ORDER — 0.9 % SODIUM CHLORIDE (POUR BTL) OPTIME
TOPICAL | Status: DC | PRN
  Administered 2021-08-24: 1000 mL

## 2021-08-24 MED ORDER — CHLORHEXIDINE GLUCONATE 0.12 % MT SOLN
15.0000 mL | Freq: Once | OROMUCOSAL | Status: AC
  Administered 2021-08-24: 15 mL via OROMUCOSAL

## 2021-08-24 MED ORDER — LIDOCAINE HCL (CARDIAC) PF 50 MG/5ML IV SOSY
PREFILLED_SYRINGE | INTRAVENOUS | Status: DC | PRN
  Administered 2021-08-24: 100 mg via INTRAVENOUS

## 2021-08-24 MED ORDER — ONDANSETRON HCL 4 MG/2ML IJ SOLN
INTRAMUSCULAR | Status: AC
  Filled 2021-08-24: qty 2

## 2021-08-24 MED ORDER — VANCOMYCIN HCL IN DEXTROSE 1-5 GM/200ML-% IV SOLN
1000.0000 mg | INTRAVENOUS | Status: AC
  Administered 2021-08-24: 1000 mg via INTRAVENOUS

## 2021-08-24 MED ORDER — PROMETHAZINE HCL 25 MG/ML IJ SOLN
6.2500 mg | INTRAMUSCULAR | Status: DC | PRN
  Administered 2021-08-24: 6.25 mg via INTRAVENOUS

## 2021-08-24 MED ORDER — KETOROLAC TROMETHAMINE 30 MG/ML IJ SOLN
INTRAMUSCULAR | Status: AC
  Filled 2021-08-24: qty 1

## 2021-08-24 MED ORDER — FENTANYL CITRATE (PF) 250 MCG/5ML IJ SOLN
INTRAMUSCULAR | Status: AC
  Filled 2021-08-24: qty 5

## 2021-08-24 MED ORDER — KETOROLAC TROMETHAMINE 30 MG/ML IJ SOLN
30.0000 mg | Freq: Once | INTRAMUSCULAR | Status: AC
  Administered 2021-08-24: 30 mg via INTRAVENOUS

## 2021-08-24 MED ORDER — PROPOFOL 10 MG/ML IV BOLUS
INTRAVENOUS | Status: DC | PRN
Start: 2021-08-24 — End: 2021-08-24
  Administered 2021-08-24: 200 mg via INTRAVENOUS

## 2021-08-24 MED ORDER — MIDAZOLAM HCL 5 MG/5ML IJ SOLN
INTRAMUSCULAR | Status: DC | PRN
  Administered 2021-08-24: 2 mg via INTRAVENOUS

## 2021-08-24 MED ORDER — LACTATED RINGERS IV SOLN
INTRAVENOUS | Status: DC
  Administered 2021-08-24: 1000 mL via INTRAVENOUS

## 2021-08-24 MED ORDER — CHLORHEXIDINE GLUCONATE 0.12 % MT SOLN
OROMUCOSAL | Status: AC
  Administered 2021-08-24: 15 mL
  Filled 2021-08-24: qty 15

## 2021-08-24 MED ORDER — BUPIVACAINE LIPOSOME 1.3 % IJ SUSP
INTRAMUSCULAR | Status: DC | PRN
  Administered 2021-08-24: 20 mL

## 2021-08-24 MED ORDER — ONDANSETRON HCL 4 MG/2ML IJ SOLN
INTRAMUSCULAR | Status: DC | PRN
  Administered 2021-08-24: 4 mg via INTRAVENOUS

## 2021-08-24 MED ORDER — SODIUM CHLORIDE FLUSH 0.9 % IV SOLN
INTRAVENOUS | Status: AC
  Filled 2021-08-24: qty 10

## 2021-08-24 MED ORDER — BUPIVACAINE LIPOSOME 1.3 % IJ SUSP
INTRAMUSCULAR | Status: AC
  Filled 2021-08-24: qty 20

## 2021-08-24 MED ORDER — PROPOFOL 10 MG/ML IV BOLUS
INTRAVENOUS | Status: AC
  Filled 2021-08-24: qty 40

## 2021-08-24 MED ORDER — PROMETHAZINE HCL 25 MG/ML IJ SOLN
INTRAMUSCULAR | Status: AC
  Filled 2021-08-24: qty 1

## 2021-08-24 MED ORDER — FENTANYL CITRATE (PF) 100 MCG/2ML IJ SOLN
INTRAMUSCULAR | Status: DC | PRN
  Administered 2021-08-24 (×4): 50 ug via INTRAVENOUS
  Administered 2021-08-24: 5 ug via INTRAVENOUS

## 2021-08-24 MED ORDER — LIDOCAINE HCL (PF) 2 % IJ SOLN
INTRAMUSCULAR | Status: AC
  Filled 2021-08-24: qty 5

## 2021-08-24 MED ORDER — MIDAZOLAM HCL 2 MG/2ML IJ SOLN
INTRAMUSCULAR | Status: AC
  Filled 2021-08-24: qty 2

## 2021-08-24 SURGICAL SUPPLY — 38 items
ADH SKN CLS APL DERMABOND .7 (GAUZE/BANDAGES/DRESSINGS) ×1
APL PRP STRL LF DISP 70% ISPRP (MISCELLANEOUS) ×1
BLADE SURG SZ11 CARB STEEL (BLADE) ×2 IMPLANT
CHLORAPREP W/TINT 26 (MISCELLANEOUS) ×2 IMPLANT
CLOTH BEACON ORANGE TIMEOUT ST (SAFETY) ×2 IMPLANT
COVER LIGHT HANDLE STERIS (MISCELLANEOUS) ×4 IMPLANT
DERMABOND ADVANCED (GAUZE/BANDAGES/DRESSINGS) ×1
DERMABOND ADVANCED .7 DNX12 (GAUZE/BANDAGES/DRESSINGS) ×1 IMPLANT
ELECT REM PT RETURN 9FT ADLT (ELECTROSURGICAL) ×2
ELECTRODE REM PT RTRN 9FT ADLT (ELECTROSURGICAL) ×1 IMPLANT
GLOVE SURG POLYISO LF SZ7.5 (GLOVE) ×2 IMPLANT
GLOVE SURG UNDER POLY LF SZ7 (GLOVE) ×4 IMPLANT
GOWN STRL REUS W/TWL LRG LVL3 (GOWN DISPOSABLE) ×6 IMPLANT
INST SET MAJOR GENERAL (KITS) IMPLANT
INST SET MINOR GENERAL (KITS) ×1 IMPLANT
KIT TURNOVER KIT A (KITS) ×2 IMPLANT
MANIFOLD NEPTUNE II (INSTRUMENTS) ×2 IMPLANT
MESH VENTRALEX ST 1-7/10 CRC S (Mesh General) ×1 IMPLANT
NDL HYPO 21X1.5 SAFETY (NEEDLE) ×1 IMPLANT
NEEDLE HYPO 21X1.5 SAFETY (NEEDLE) ×2 IMPLANT
NS IRRIG 1000ML POUR BTL (IV SOLUTION) ×2 IMPLANT
PACK MAJOR ABDOMINAL (CUSTOM PROCEDURE TRAY) ×1 IMPLANT
PACK MINOR (CUSTOM PROCEDURE TRAY) IMPLANT
PAD ARMBOARD 7.5X6 YLW CONV (MISCELLANEOUS) ×2 IMPLANT
PENCIL SMOKE EVACUATOR (MISCELLANEOUS) ×2 IMPLANT
SET BASIN LINEN APH (SET/KITS/TRAYS/PACK) ×2 IMPLANT
SUT ETHIBOND 0 MO6 C/R (SUTURE) ×1 IMPLANT
SUT MNCRL AB 4-0 PS2 18 (SUTURE) ×2 IMPLANT
SUT NOVA NAB GS-22 2 2-0 T-19 (SUTURE) IMPLANT
SUT PROLENE 0 CT 1 CR/8 (SUTURE) IMPLANT
SUT SILK 2 0 (SUTURE)
SUT SILK 2-0 18XBRD TIE 12 (SUTURE) IMPLANT
SUT VIC AB 2-0 CT1 27 (SUTURE) ×2
SUT VIC AB 2-0 CT1 TAPERPNT 27 (SUTURE) ×1 IMPLANT
SUT VIC AB 3-0 SH 27 (SUTURE) ×4
SUT VIC AB 3-0 SH 27X BRD (SUTURE) ×1 IMPLANT
SUT VICRYL AB 2 0 TIES (SUTURE) IMPLANT
SYR 20ML LL LF (SYRINGE) ×4 IMPLANT

## 2021-08-24 NOTE — Anesthesia Preprocedure Evaluation (Signed)
Anesthesia Evaluation  Patient identified by MRN, date of birth, ID band Patient awake    Reviewed: Allergy & Precautions, H&P , NPO status , Patient's Chart, lab work & pertinent test results, reviewed documented beta blocker date and time   Airway Mallampati: II  TM Distance: >3 FB Neck ROM: full    Dental no notable dental hx.    Pulmonary neg pulmonary ROS, Current Smoker,    Pulmonary exam normal breath sounds clear to auscultation       Cardiovascular Exercise Tolerance: Good hypertension, negative cardio ROS   Rhythm:regular Rate:Normal     Neuro/Psych  Neuromuscular disease negative psych ROS   GI/Hepatic Neg liver ROS, GERD  Medicated,  Endo/Other  negative endocrine ROS  Renal/GU negative Renal ROS  negative genitourinary   Musculoskeletal   Abdominal   Peds  Hematology negative hematology ROS (+)   Anesthesia Other Findings   Reproductive/Obstetrics negative OB ROS                             Anesthesia Physical Anesthesia Plan  ASA: 2  Anesthesia Plan: General   Post-op Pain Management:    Induction:   PONV Risk Score and Plan: Ondansetron  Airway Management Planned:   Additional Equipment:   Intra-op Plan:   Post-operative Plan:   Informed Consent: I have reviewed the patients History and Physical, chart, labs and discussed the procedure including the risks, benefits and alternatives for the proposed anesthesia with the patient or authorized representative who has indicated his/her understanding and acceptance.     Dental Advisory Given  Plan Discussed with: CRNA  Anesthesia Plan Comments:         Anesthesia Quick Evaluation

## 2021-08-24 NOTE — Transfer of Care (Signed)
Immediate Anesthesia Transfer of Care Note  Patient: Elizabeth [name]  Procedure(s) Performed: HERNIA REPAIR INCISIONAL WITH MESH  Patient Location: PACU  Anesthesia Type:General  Level of Consciousness: awake  Airway & Oxygen Therapy: Patient Spontanous Breathing  Post-op Assessment: Report given to RN  Post vital signs: Reviewed and stable  Last Vitals:  Vitals Value Taken Time  BP    Temp    Pulse 77 08/24/21 0844  Resp 12 08/24/21 0844  SpO2 99 % 08/24/21 0844  Vitals shown include unvalidated device data.  Last Pain:  Vitals:   08/24/21 0642  TempSrc: Oral  PainSc: 0-No pain      Patients Stated Pain Goal: 7 (91/22/58 3462)  Complications: No notable events documented.

## 2021-08-24 NOTE — Anesthesia Postprocedure Evaluation (Signed)
Anesthesia Post Note  Patient: Elizabeth [name]  Procedure(s) Performed: [place] WITH MESH  Patient location during evaluation: PACU Anesthesia Type: General Level of consciousness: awake and alert Pain management: pain level controlled Vital Signs Assessment: post-procedure vital signs reviewed and stable Respiratory status: spontaneous breathing Cardiovascular status: blood pressure returned to baseline and stable Postop Assessment: no apparent nausea or vomiting Anesthetic complications: no   No notable events documented.   Last Vitals:  Vitals:   08/24/21 0930 08/24/21 0941  BP: (!) 90/54 110/87  Pulse: 68 (!) 55  Resp: 16 18  Temp:  36.9 C  SpO2: 93% 100%    Last Pain:  Vitals:   08/24/21 0941  TempSrc: Oral  PainSc: 0-No pain                 Audley Hinojos

## 2021-08-24 NOTE — Interval H&P Note (Signed)
History and Physical Interval Note:  08/24/2021 7:08 AM  Elizabeth [name]  has presented today for surgery, with the diagnosis of Incisional hernia.  The various methods of treatment have been discussed with the patient and family. After consideration of risks, benefits and other options for treatment, the patient has consented to  Procedure(s): HERNIA REPAIR INCISIONAL (N/A) as a surgical intervention.  The patient's history has been reviewed, patient examined, no change in status, stable for surgery.  I have reviewed the patient's chart and labs.  Questions were answered to the patient's satisfaction.     Aviva Signs

## 2021-08-24 NOTE — Anesthesia Procedure Notes (Addendum)
Procedure Name: LMA Insertion Date/Time: 08/24/2021 7:48 AM Performed by: Ollen Bowl, CRNA Pre-anesthesia Checklist: Patient identified, Patient being monitored, Emergency Drugs available, Timeout performed and Suction available Patient Re-evaluated:Patient Re-evaluated prior to induction Oxygen Delivery Method: Circle System Utilized Preoxygenation: Pre-oxygenation with 100% oxygen Induction Type: IV induction Ventilation: Mask ventilation without difficulty LMA: LMA inserted LMA Size: 3.0 Number of attempts: 1 Placement Confirmation: positive ETCO2 and breath sounds checked- equal and bilateral Comments: #4 placed initially with poor ventilation and changed to 3.  Good ventilation with air leak at 18.

## 2021-08-24 NOTE — Op Note (Signed)
Patient:  Elizabeth [name]  DOB:  02/03/1961  MRN:  [id number]   Preop Diagnosis: Incisional hernia  Postop Diagnosis: Same  Procedure: Incisional herniorrhaphy with mesh  Surgeon: Aviva Signs, MD  Anes: General  Indications: Patient is a 60 year old white female who presents with an incisional hernia.  The risks and benefits of the procedure including bleeding, infection, mesh use, and the possibility of recurrence of the hernia were fully explained to the patient, who gave informed consent.  Procedure note: The patient was placed in the supine position.  After general anesthesia was administered, the abdomen was prepped and draped using the usual sterile technique with ChloraPrep.  Surgical site confirmation was performed.  A midline incision was made just superior to the previous midline incision at the umbilical level.  The dissection was taken down to the fascia.  A 1.5 cm fascial defect was noted with fat tissue emanating image.  It was freed away from its surrounding attachments to the fascia and reduced.  I did palpate the underside of the abdominal wall and no other hernia defects were noted.  A Bard 4.3 cm Ventralax ST patch was inserted and secured to the fascia using 0 Ethibond interrupted sutures.  The fascia overlying the mesh was closed longitudinally using 0 Ethibond interrupted sutures.  The subcutaneous layer was reapproximated using 3-0 Vicryl interrupted sutures.  Exparel was instilled into the surrounding wound.  The skin was closed using a 4-0 Monocryl subcuticular suture.  Dermabond was applied.  All tape and needle counts were correct at the end of the procedure.  The patient was awakened and transferred to PACU in stable condition.  Complications: None  EBL: Minimal  Specimen: None

## 2021-08-25 ENCOUNTER — Encounter (HOSPITAL_COMMUNITY): Payer: Self-pay | Admitting: General Surgery

## 2021-09-03 ENCOUNTER — Telehealth (INDEPENDENT_AMBULATORY_CARE_PROVIDER_SITE_OTHER): Payer: 59 | Admitting: General Surgery

## 2021-09-03 DIAGNOSIS — Z09 Encounter for follow-up examination after completed treatment for conditions other than malignant neoplasm: Secondary | ICD-10-CM

## 2021-09-03 NOTE — Telephone Encounter (Signed)
Postoperative telephone call performed with patient.  Patient states she is doing well.  She is back on her Xarelto.  She has minimal incisional pain.  I told her to call me should any problems arise.  She is pleased with the results.  As this was a part of the global surgical fee, this was not a billable visit.  Total telephone time was 3 minutes.

## 2021-09-10 ENCOUNTER — Other Ambulatory Visit (INDEPENDENT_AMBULATORY_CARE_PROVIDER_SITE_OTHER): Payer: Self-pay | Admitting: Gastroenterology

## 2021-09-10 NOTE — Telephone Encounter (Signed)
Last seen 07/14/21 for gerd, crohn's, ibs

## 2021-09-18 ENCOUNTER — Encounter (INDEPENDENT_AMBULATORY_CARE_PROVIDER_SITE_OTHER): Payer: Self-pay

## 2021-09-23 ENCOUNTER — Telehealth (INDEPENDENT_AMBULATORY_CARE_PROVIDER_SITE_OTHER): Payer: Self-pay | Admitting: *Deleted

## 2021-09-23 MED ORDER — PROMETHAZINE HCL 25 MG PO TABS: ORAL_TABLET | ORAL | 0 refills | Status: DC

## 2021-09-23 MED ORDER — COLESTIPOL HCL 1 G PO TABS: ORAL_TABLET | ORAL | 2 refills | Status: DC

## 2021-09-23 NOTE — Telephone Encounter (Signed)
Pt wanted to see if she could get refill on phenergan 42m tabs. She has some left over from a surgery that have expired. Also her daughter takes colestipol and states it works well for her and pt would like to see if she can get an rx for it. States she cannot go out to eat without going to bathroom.   Per dr rLaural Golden- phenergan 264mone bid prn #20 0 refills.  Colestipol 1gram #120 2 refills. Take 2 grams once a day for one week and may increase to bid prn. Do not take within 2 hours of other meds.   [facility] verbalized understanding of all and meds were sent to pharm.

## 2021-09-25 NOTE — Telephone Encounter (Signed)
error 

## 2021-10-06 ENCOUNTER — Other Ambulatory Visit: Payer: Self-pay

## 2021-10-06 ENCOUNTER — Encounter (INDEPENDENT_AMBULATORY_CARE_PROVIDER_SITE_OTHER): Payer: Self-pay | Admitting: Internal Medicine

## 2021-10-06 ENCOUNTER — Ambulatory Visit (INDEPENDENT_AMBULATORY_CARE_PROVIDER_SITE_OTHER): Payer: 59 | Admitting: Internal Medicine

## 2021-10-06 VITALS — BP 118/81 | HR 86 | Temp 98.0°F | Ht 67.0 in | Wt 215.0 lb

## 2021-10-06 DIAGNOSIS — K5080 Crohn's disease of both small and large intestine without complications: Secondary | ICD-10-CM | POA: Diagnosis not present

## 2021-10-06 DIAGNOSIS — R109 Unspecified abdominal pain: Secondary | ICD-10-CM | POA: Insufficient documentation

## 2021-10-06 MED ORDER — CIPROFLOXACIN HCL 500 MG PO TABS: 500.0000 mg | ORAL_TABLET | Freq: Two times a day (BID) | ORAL | 0 refills | Status: DC

## 2021-10-06 MED ORDER — METRONIDAZOLE 500 MG PO TABS: 500.0000 mg | ORAL_TABLET | Freq: Two times a day (BID) | ORAL | 0 refills | Status: DC

## 2021-10-06 MED ORDER — PREDNISONE 10 MG PO TABS: ORAL_TABLET | ORAL | 0 refills | Status: DC

## 2021-10-06 NOTE — Patient Instructions (Signed)
Low residue diet for the next 2 days as discussed Physician will call with results of blood test and CT when completed

## 2021-10-06 NOTE — Progress Notes (Signed)
Presenting complaint;  Abdominal pain and bloating.  Database and subjective:  This is unplanned visit for patient with history of small and large bowel Crohn's disease who is status post right hemicolectomy(November 2005 at [facility]) and history of sigmoid diverticulitis status post sigmoid colon resection in January 2016 at [facility] who was last seen in the office on 07/14/2021 and was doing well while on Humira/adalimumab and oral mesalamine.  It was decided to discontinue mesalamine/Pentasa. She was also noted to have elevated transaminases by Dr. Gar Ponto.  Elevated transaminases are felt to be due to fatty liver.  Total hepatitis A antibody was negative.  Hepatitis B surface antibody was positive implying immunity from prior vaccination.  However hepatitis C antibody was positive and was followed up with HCVRNA and was negative.  Patient called office few days ago and requested to be seen. She had incisional herniorrhaphy with mesh by Dr. Arnoldo Morale on 08/24/2021 and was doing well. Patient states that she developed constipation about 10 days ago and strained extremely hard and ever since she has been experiencing pain in left upper quadrant of her abdomen which subsequently involve upper abdomen.  Recently she experienced 2 nights of regurgitation and some heartburn but other than that PPI has been working.  Now she is having 3-4 soft stools daily.  She denies melena rectal bleeding fever chills or night sweats.  She does not take NSAIDs.  She feels bloated.  At times she hears abdominal gurgling.  She reports worsening pain on walking.  Her appetite is fair.  She is watching her calorie intake.  She does not understand why she is gaining weight.  She has gained 3 pounds since her last visit.  Dicyclomine has not helped with the symptoms. She also complains of insomnia.  She states she never naps during the daytime.  She has a history of insomnia and has been on Tranxene in the past given to her by  her PCP.  She has not seen Dr. Gar Ponto for this issue. She takes furosemide no more than once a month.  Last Humira dose was 8 days ago.   Current Medications: Outpatient Encounter Medications as of 10/06/2021  Medication Sig   Adalimumab (HUMIRA) 40 MG/0.4ML PSKT Inject 40 mg into the skin every 14 (fourteen) days. Patient will start the maintenance Tuesday February 22,2022   albuterol (VENTOLIN HFA) 108 (90 Base) MCG/ACT inhaler Inhale 2 puffs into the lungs every 6 (six) hours as needed for wheezing or shortness of breath.   Ascorbic Acid (VITAMIN C) 1000 MG tablet Take 1,000 mg by mouth daily.   Cholecalciferol (VITAMIN D3) 50 MCG (2000 UT) TABS TAKE 1 TABLET BY MOUTH EVERY DAY   dicyclomine (BENTYL) 10 MG capsule Take 1 capsule (10 mg total) by mouth 3 (three) times daily as needed for spasms.   fluticasone (FLONASE) 50 MCG/ACT nasal spray Place 2 sprays into both nostrils daily as needed for allergies or rhinitis.   furosemide (LASIX) 40 MG tablet Take 40 mg by mouth daily as needed for edema.   HYDROcodone-acetaminophen (NORCO) 5-325 MG tablet Take 1 tablet by mouth every 4 (four) hours as needed for moderate pain.   omeprazole (PRILOSEC) 40 MG capsule Take 40 mg by mouth daily before breakfast.   ondansetron (ZOFRAN-ODT) 8 MG disintegrating tablet Take 1 tablet (8 mg total) by mouth 2 (two) times daily as needed.   potassium chloride (MICRO-K) 10 MEQ CR capsule Take 10 mEq by mouth 2 (two) times daily.  Probiotic Product (PROBIOTIC PO) Take by mouth daily.   promethazine (PHENERGAN) 25 MG tablet Take one bid prn   rivaroxaban (XARELTO) 20 MG TABS tablet Take 20 mg by mouth daily with supper.   valsartan-hydrochlorothiazide (DIOVAN-HCT) 160-12.5 MG per tablet Take 1 tablet by mouth daily.    zinc gluconate 50 MG tablet Take 50 mg by mouth daily.   colestipol (COLESTID) 1 g tablet Start with 2 tablets once daily for one week, then may increase to 2 tabs bid if needed. Do not take  within 2 hours of other meds. (Patient not taking: Reported on 10/06/2021)   mesalamine (PENTASA) 500 MG CR capsule Take 1,000 mg by mouth 4 (four) times daily.  (Patient not taking: Reported on 10/06/2021)   No facility-administered encounter medications on file as of 10/06/2021.     Objective: Blood pressure 118/81, pulse 86, temperature 98 F (36.7 C), temperature source Oral, height 5' 7"  (1.702 m), weight 215 lb (97.5 kg). Patient is alert and in no acute distress. Conjunctiva is pink. Sclera is nonicteric Oropharyngeal mucosa is normal. No neck masses or thyromegaly noted. Cardiac exam with regular rhythm normal S1 and S2. No murmur or gallop noted. Lungs are clear to auscultation. Abdomen is full.  Bowel sounds are normal.  She has showed mid abdominal scar curving along the right side of umbilicus as well as lower midline scars.  On palpation abdomen is soft.  She has mild tenderness across upper abdomen more so in left upper quadrant but there is no guarding or rebound.  No tenderness noted in right lower quadrant.  No organomegaly or masses. No LE edema or clubbing noted.  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   Labs/studies Results:   CBC Latest Ref Rng & Units 03/10/2021 10/28/2020 06/27/2020  WBC 3.8 - 10.8 Thousand/uL 5.6 9.4 9.7  Hemoglobin 11.7 - 15.5 g/dL 13.4 14.9 12.6  Hematocrit 35.0 - 45.0 % 38.9 41.4 36.6  Platelets 140 - 400 Thousand/uL 227 248 430(H)    CMP Latest Ref Rng & Units 08/20/2021 07/21/2021 03/10/2021  Glucose 70 - 99 mg/dL 103(H) - 115  BUN 8 - 23 mg/dL 16 - 13  Creatinine 0.44 - 1.00 mg/dL 0.87 - 0.77  Sodium 135 - 145 mmol/L 140 - 142  Potassium 3.5 - 5.1 mmol/L 3.2(L) - 4.0  Chloride 98 - 111 mmol/L 104 - 108  CO2 22 - 32 mmol/L 26 - 24  Calcium 8.9 - 10.3 mg/dL 8.3(L) - 8.5(L)  Total Protein 6.1 - 8.1 g/dL - 6.4 5.7(L)  Total Bilirubin 0.2 - 1.2 mg/dL - 0.6 0.4  Alkaline Phos 38 - 126 U/L - - -  AST 10 - 35 U/L -  60(H) 34  ALT 6 - 29 U/L - 71(H) 38(H)    Hepatic Function Latest Ref Rng & Units 07/21/2021 03/10/2021 10/28/2020  Total Protein 6.1 - 8.1 g/dL 6.4 5.7(L) 6.1  Albumin 3.5 - 5.0 g/dL - - -  AST 10 - 35 U/L 60(H) 34 18  ALT 6 - 29 U/L 71(H) 38(H) 21  Alk Phosphatase 38 - 126 U/L - - -  Total Bilirubin 0.2 - 1.2 mg/dL 0.6 0.4 0.6  Bilirubin, Direct 0.0 - 0.2 mg/dL 0.2 - -      Assessment:  #1.  Abdominal pain and bloating in a patient with known history of Crohn's disease as well as history of diverticulitis.  She had sigmoid colon removed in January 2016 for recurrent sigmoid diverticulitis.  Her pain is different from the pain that she has had previously with a flareup of her Crohn's disease which always has been a right lower quadrant and pain of diverticulitis has been a left lower quadrant.  Her symptoms started after extreme straining over 10 days ago.  Abdominal exam does not reveal another hernia.  She is status post incisional herniorrhaphy with mesh about 6 weeks ago and I do not believe the surgery has anything to do with her symptoms. Differential diagnosis includes relapse of Crohn's disease despite the fact that she is on biologic, atypical manifestation of diverticulitis or she could have unrelated source of pain i.e. pancreatitis.  #2.  History of Crohn's disease.  She is status post ileocecectomy in November 2005 and presently on Humira/adalimumab for maintenance.  Her fecal calprotectin back in May 2022 was 106.  #3.  History of mildly elevated transaminases felt to be due to fatty liver.  #4.  Chronic insomnia.  Insomnia does not appear to be chronic disease.  I would recommend she follow-up with Dr. Gar Ponto for management of her insomnia.   Plan:  Patient will go to the lab for CBC, comprehensive chemistry panel and CRP.  We will also check serum lipase. Schedule patient for abdominal pelvic CT with oral and IV contrast as soon as possible.  Since she developed rash  with IV contrast she will be pretreated with prednisone by mouth 50 mg 13 hours, 7 hours and 1 hour prior to CT along with Benadryl 50 mg p.o. 1 hour prior to CT. Further recommendations to follow.

## 2021-10-07 LAB — COMPREHENSIVE METABOLIC PANEL
AG Ratio: 1.1 (calc) (ref 1.0–2.5)
ALT: 19 U/L (ref 6–29)
AST: 36 U/L — ABNORMAL HIGH (ref 10–35)
Albumin: 3.4 g/dL — ABNORMAL LOW (ref 3.6–5.1)
Alkaline phosphatase (APISO): 63 U/L (ref 37–153)
BUN: 13 mg/dL (ref 7–25)
CO2: 27 mmol/L (ref 20–32)
Calcium: 8.6 mg/dL (ref 8.6–10.4)
Chloride: 105 mmol/L (ref 98–110)
Creat: 0.82 mg/dL (ref 0.50–1.05)
Globulin: 3 g/dL (calc) (ref 1.9–3.7)
Glucose, Bld: 99 mg/dL (ref 65–99)
Potassium: 3.8 mmol/L (ref 3.5–5.3)
Sodium: 141 mmol/L (ref 135–146)
Total Bilirubin: 0.6 mg/dL (ref 0.2–1.2)
Total Protein: 6.4 g/dL (ref 6.1–8.1)

## 2021-10-07 LAB — CBC WITH DIFFERENTIAL/PLATELET
Absolute Monocytes: 317 cells/uL (ref 200–950)
Basophils Absolute: 41 cells/uL (ref 0–200)
Basophils Relative: 0.6 %
Eosinophils Absolute: 62 cells/uL (ref 15–500)
Eosinophils Relative: 0.9 %
HCT: 41.3 % (ref 35.0–45.0)
Hemoglobin: 14.4 g/dL (ref 11.7–15.5)
Lymphs Abs: 1718 cells/uL (ref 850–3900)
MCH: 35.6 pg — ABNORMAL HIGH (ref 27.0–33.0)
MCHC: 34.9 g/dL (ref 32.0–36.0)
MCV: 102.2 fL — ABNORMAL HIGH (ref 80.0–100.0)
MPV: 10.1 fL (ref 7.5–12.5)
Monocytes Relative: 4.6 %
Neutro Abs: 4761 cells/uL (ref 1500–7800)
Neutrophils Relative %: 69 %
Platelets: 230 10*3/uL (ref 140–400)
RBC: 4.04 10*6/uL (ref 3.80–5.10)
RDW: 13.3 % (ref 11.0–15.0)
Total Lymphocyte: 24.9 %
WBC: 6.9 10*3/uL (ref 3.8–10.8)

## 2021-10-07 LAB — C-REACTIVE PROTEIN: CRP: 6.3 mg/L (ref <8.0)

## 2021-10-07 LAB — LIPASE: Lipase: 12 U/L (ref 7–60)

## 2021-10-08 ENCOUNTER — Other Ambulatory Visit (INDEPENDENT_AMBULATORY_CARE_PROVIDER_SITE_OTHER): Payer: Self-pay

## 2021-10-08 DIAGNOSIS — K5080 Crohn's disease of both small and large intestine without complications: Secondary | ICD-10-CM

## 2021-10-08 DIAGNOSIS — R109 Unspecified abdominal pain: Secondary | ICD-10-CM

## 2021-10-09 ENCOUNTER — Ambulatory Visit (HOSPITAL_COMMUNITY)
Admission: RE | Admit: 2021-10-09 | Discharge: 2021-10-09 | Disposition: A | Discharge status: Home / Self Care | Payer: 59 | Source: Ambulatory Visit | Attending: Internal Medicine | Admitting: Internal Medicine

## 2021-10-09 ENCOUNTER — Other Ambulatory Visit: Payer: Self-pay

## 2021-10-09 ENCOUNTER — Ambulatory Visit (HOSPITAL_COMMUNITY): Admission: RE | Admit: 2021-10-09 | Payer: 59 | Source: Ambulatory Visit

## 2021-10-09 DIAGNOSIS — R109 Unspecified abdominal pain: Secondary | ICD-10-CM | POA: Diagnosis present

## 2021-10-09 DIAGNOSIS — K5080 Crohn's disease of both small and large intestine without complications: Secondary | ICD-10-CM | POA: Diagnosis present

## 2021-10-11 ENCOUNTER — Telehealth: Payer: Self-pay | Admitting: Internal Medicine

## 2021-10-11 NOTE — Telephone Encounter (Signed)
Patient called me this morning.  Reports seeing Dr. Laural Golden 5 days ago.  Felt to possibly have an enteric infection by patient report.  Started on Cipro and Flagyl. Of note, history of Crohn's felt to be in remission on Humira at baseline. 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 This lady called me to tell me that not only has her symptoms not improved on Cipro and Flagyl they have considerably worsened over the past 48 hours.  As of approximately 9:00 this morning, she already have 12 loose nonbloody BMs this morning.  No fever, nausea or vomiting.  No significant abdominal pain States she can hardly stay out of the bathroom.  Patient feels strongly she has recurrent C. difficile as symptoms are  identical to those noted previously. Given her history and lack of response to Flagyl and metronidazole.  I recommended she go ahead and start back on vancomycin 125 mg 4 times daily today (patient still has some vancomycin tablets on hand).  Maintain hydration We will arrange for stool sample to be submitted tomorrow for C. difficile testing. Further recommendations per Dr. Laural Golden

## 2021-10-12 ENCOUNTER — Other Ambulatory Visit (INDEPENDENT_AMBULATORY_CARE_PROVIDER_SITE_OTHER): Payer: Self-pay | Admitting: Gastroenterology

## 2021-10-12 ENCOUNTER — Telehealth (INDEPENDENT_AMBULATORY_CARE_PROVIDER_SITE_OTHER): Payer: Self-pay | Admitting: *Deleted

## 2021-10-12 DIAGNOSIS — R197 Diarrhea, unspecified: Secondary | ICD-10-CM

## 2021-10-12 NOTE — Telephone Encounter (Signed)
Order placed, will update dr. Laural Golden

## 2021-10-12 NOTE — Telephone Encounter (Signed)
Communication noted.  

## 2021-10-12 NOTE — Telephone Encounter (Signed)
Pt states she talked to dr Sydell Axon yesterday and he told her to stop flagyl and start vancomycin 125 qid that she already has at home. States diarrhea is jelly like and has strong odor like it was when she had c diff all of last year and back in February. States she feels better today since starting vancomycin. States dr Sydell Axon wanted her to have c diff test and asked if order could be put in so she could go this morning.

## 2021-10-12 NOTE — Telephone Encounter (Signed)
Pt.notified

## 2021-10-13 ENCOUNTER — Other Ambulatory Visit (INDEPENDENT_AMBULATORY_CARE_PROVIDER_SITE_OTHER): Payer: Self-pay | Admitting: Internal Medicine

## 2021-10-13 MED ORDER — VANCOMYCIN HCL 125 MG PO CAPS: 125.0000 mg | ORAL_CAPSULE | Freq: Four times a day (QID) | ORAL | 0 refills | Status: DC

## 2021-10-14 LAB — C. DIFFICILE GDH AND TOXIN A/B
GDH ANTIGEN: DETECTED
MICRO NUMBER:: 12574132
SPECIMEN QUALITY:: ADEQUATE
TOXIN A AND B: NOT DETECTED

## 2021-10-14 LAB — CLOSTRIDIUM DIFFICILE TOXIN B, QUALITATIVE, REAL-TIME PCR: Toxigenic C. Difficile by PCR: NOT DETECTED

## 2021-10-14 NOTE — Progress Notes (Signed)
Results given to patient earlier today. Now she says diarrhea is back.  She is not having any fever rectal bleeding nausea or vomiting. She is on p.o. vancomycin. Will proceed with GI pathogen panel.

## 2021-10-15 ENCOUNTER — Other Ambulatory Visit (INDEPENDENT_AMBULATORY_CARE_PROVIDER_SITE_OTHER): Payer: Self-pay | Admitting: *Deleted

## 2021-10-15 DIAGNOSIS — R197 Diarrhea, unspecified: Secondary | ICD-10-CM

## 2021-10-19 ENCOUNTER — Telehealth (INDEPENDENT_AMBULATORY_CARE_PROVIDER_SITE_OTHER): Payer: Self-pay | Admitting: *Deleted

## 2021-10-19 NOTE — Telephone Encounter (Signed)
Pt left vm today asking if results of lab is ready. Results are not ready. Left message to return call .

## 2021-10-19 NOTE — Telephone Encounter (Signed)
Pt notified results are not final yet. I will check again in the morning.

## 2021-10-20 ENCOUNTER — Other Ambulatory Visit (INDEPENDENT_AMBULATORY_CARE_PROVIDER_SITE_OTHER): Payer: Self-pay | Admitting: *Deleted

## 2021-10-20 NOTE — Telephone Encounter (Signed)
Called lab and spoke with rep that told me test code was wrong and the one we used was no longer used which was 94696. And then when I asked for correct test code I was told [id number] and when I put that in nothing came up. I was then told to go IT to have our codes fixed. I called back to [facility] in hopes to get a different representative and did who told me they did have sample and it was in process. Received yesterday and can take up to 5 days. I asked her to make sure the first representative did not cancel order and she transferred me to another rep who told me order was not canceled and in process and nothing was wrong with test code or sample. I called pt and let her know it could take up to 5 days to get results.

## 2021-10-22 NOTE — Telephone Encounter (Signed)
Pt called today to check on results of stool test and still pending. Pt asked if I would call her tomorrow and let her know either way if results are back.

## 2021-10-23 ENCOUNTER — Telehealth (INDEPENDENT_AMBULATORY_CARE_PROVIDER_SITE_OTHER): Payer: Self-pay | Admitting: *Deleted

## 2021-10-23 ENCOUNTER — Other Ambulatory Visit (INDEPENDENT_AMBULATORY_CARE_PROVIDER_SITE_OTHER): Payer: Self-pay | Admitting: *Deleted

## 2021-10-23 ENCOUNTER — Other Ambulatory Visit (INDEPENDENT_AMBULATORY_CARE_PROVIDER_SITE_OTHER): Payer: Self-pay | Admitting: Internal Medicine

## 2021-10-23 DIAGNOSIS — R197 Diarrhea, unspecified: Secondary | ICD-10-CM

## 2021-10-23 LAB — GASTROINTESTINAL PATHOGEN PANEL PCR

## 2021-10-23 MED ORDER — CHOLESTYRAMINE 4 G PO PACK: 4.0000 g | PACK | Freq: Two times a day (BID) | ORAL | 2 refills | Status: DC

## 2021-10-23 NOTE — Telephone Encounter (Signed)
Per Racquel M at [facility] they have received the specimen and states it takes 8-15 days turn around. The test is still currently in process, and could be another eight days before resulted.

## 2021-10-23 NOTE — Telephone Encounter (Signed)
Discussed with dr Laural Golden and he wants to do cbc with diff, crp and cmet if pt is still having diarrhea. Called pt told her test still pending and she is still having diarrhea and pt states she will do bw on Monday because she does not want to come out today in bad weather

## 2021-10-23 NOTE — Telephone Encounter (Signed)
Per dr Laural Golden - call in [place] 4g bid one month supply. Do not take med within 2 hours before or after taking med. Called and discussed with pt and pt verbalized understanding.

## 2021-10-26 ENCOUNTER — Other Ambulatory Visit (INDEPENDENT_AMBULATORY_CARE_PROVIDER_SITE_OTHER): Payer: Self-pay | Admitting: *Deleted

## 2021-10-26 DIAGNOSIS — R197 Diarrhea, unspecified: Secondary | ICD-10-CM

## 2021-10-26 NOTE — Telephone Encounter (Signed)
Called pt and she states she had some colestipol 1g bid at home and did not want to take [place]. States she is still having some diarrhea but better since starting on colestipol.

## 2021-10-26 NOTE — Telephone Encounter (Signed)
10/06/2021 Crohns with Dr. Laural Golden.

## 2021-10-27 LAB — COMPREHENSIVE METABOLIC PANEL
AG Ratio: 1.4 (calc) (ref 1.0–2.5)
ALT: 17 U/L (ref 6–29)
AST: 33 U/L (ref 10–35)
Albumin: 3.6 g/dL (ref 3.6–5.1)
Alkaline phosphatase (APISO): 59 U/L (ref 37–153)
BUN: 11 mg/dL (ref 7–25)
CO2: 24 mmol/L (ref 20–32)
Calcium: 8.7 mg/dL (ref 8.6–10.4)
Chloride: 105 mmol/L (ref 98–110)
Creat: 0.76 mg/dL (ref 0.50–1.05)
Globulin: 2.6 g/dL (calc) (ref 1.9–3.7)
Glucose, Bld: 97 mg/dL (ref 65–99)
Potassium: 3.9 mmol/L (ref 3.5–5.3)
Sodium: 140 mmol/L (ref 135–146)
Total Bilirubin: 0.7 mg/dL (ref 0.2–1.2)
Total Protein: 6.2 g/dL (ref 6.1–8.1)

## 2021-10-27 LAB — CBC WITH DIFFERENTIAL/PLATELET
Absolute Monocytes: 320 cells/uL (ref 200–950)
Basophils Absolute: 38 cells/uL (ref 0–200)
Basophils Relative: 0.6 %
Eosinophils Absolute: 58 cells/uL (ref 15–500)
Eosinophils Relative: 0.9 %
HCT: 41.2 % (ref 35.0–45.0)
Hemoglobin: 14.6 g/dL (ref 11.7–15.5)
Lymphs Abs: 1882 cells/uL (ref 850–3900)
MCH: 36.2 pg — ABNORMAL HIGH (ref 27.0–33.0)
MCHC: 35.4 g/dL (ref 32.0–36.0)
MCV: 102.2 fL — ABNORMAL HIGH (ref 80.0–100.0)
MPV: 10.8 fL (ref 7.5–12.5)
Monocytes Relative: 5 %
Neutro Abs: 4102 cells/uL (ref 1500–7800)
Neutrophils Relative %: 64.1 %
Platelets: 234 10*3/uL (ref 140–400)
RBC: 4.03 10*6/uL (ref 3.80–5.10)
RDW: 13.4 % (ref 11.0–15.0)
Total Lymphocyte: 29.4 %
WBC: 6.4 10*3/uL (ref 3.8–10.8)

## 2021-10-27 LAB — C-REACTIVE PROTEIN: CRP: 2.9 mg/L (ref <8.0)

## 2021-10-30 LAB — GASTROINTESTINAL PATHOGEN PANEL PCR
C. difficile Tox A/B, PCR: NOT DETECTED
Campylobacter, PCR: NOT DETECTED
Cryptosporidium, PCR: NOT DETECTED
E coli (ETEC) LT/ST PCR: NOT DETECTED
E coli (STEC) stx1/stx2, PCR: NOT DETECTED
E coli 0157, PCR: NOT DETECTED
Giardia lamblia, PCR: NOT DETECTED
Norovirus, PCR: NOT DETECTED
Rotavirus A, PCR: NOT DETECTED
Salmonella, PCR: NOT DETECTED
Shigella, PCR: NOT DETECTED

## 2021-11-02 ENCOUNTER — Encounter (INDEPENDENT_AMBULATORY_CARE_PROVIDER_SITE_OTHER): Payer: Self-pay

## 2021-11-02 ENCOUNTER — Other Ambulatory Visit (INDEPENDENT_AMBULATORY_CARE_PROVIDER_SITE_OTHER): Payer: Self-pay

## 2021-11-02 ENCOUNTER — Telehealth (INDEPENDENT_AMBULATORY_CARE_PROVIDER_SITE_OTHER): Payer: Self-pay

## 2021-11-02 DIAGNOSIS — R197 Diarrhea, unspecified: Secondary | ICD-10-CM

## 2021-11-02 MED ORDER — PEG 3350-KCL-NA BICARB-NACL 420 G PO SOLR: 4000.0000 mL | ORAL | 0 refills | Status: DC

## 2021-11-02 NOTE — Telephone Encounter (Signed)
Elizabeth [name], CMA  

## 2021-11-03 ENCOUNTER — Telehealth (INDEPENDENT_AMBULATORY_CARE_PROVIDER_SITE_OTHER): Payer: Self-pay

## 2021-11-03 NOTE — Patient Instructions (Addendum)
Elizabeth [name]  11/03/2021     @PREFPERIOPPHARMACY @   Your procedure is scheduled on 11/11/2021.  Report to [facility] at 12:50 A.M.  Call this number if you have problems the morning of surgery:  [phone]   Remember:    Please follow the diet and prep instructions given to you by the doctors office.     Take these medicines the morning of surgery with A SIP OF WATER : Prilosec   Please use your inhaler and your Flonase before coming to the hospital.     Do not wear jewelry, make-up or nail polish.  Do not wear lotions, powders, or perfumes, or deodorant.  Do not shave 48 hours prior to surgery.  Men may shave face and neck.  Do not bring valuables to the hospital.  [facility] is not responsible for any belongings or valuables.  Contacts, dentures or bridgework may not be worn into surgery.  Leave your suitcase in the car.  After surgery it may be brought to your room.  For patients admitted to the hospital, discharge time will be determined by your treatment team.  Patients discharged the day of surgery will not be allowed to drive home.   Name and phone number of your driver:   Family Special instructions:  N/A  Please read over the following fact sheets that you were given. Care and Recovery After Surgery   Colonoscopy, Adult A colonoscopy is a procedure to look at the entire large intestine. This procedure is done using a long, thin, flexible tube that has a camera on the end. You may have a colonoscopy: As a part of normal colorectal screening. If you have certain symptoms, such as: A low number of red blood cells in your blood (anemia). Diarrhea that does not go away. Pain in your abdomen. Blood in your stool. A colonoscopy can help screen for and diagnose medical problems, including: Tumors. Extra tissue that grows where mucus forms (polyps). Inflammation. Areas of bleeding. Tell your health care provider about: Any allergies you have. All medicines  you are taking, including vitamins, herbs, eye drops, creams, and over-the-counter medicines. Any problems you or family members have had with anesthetic medicines. Any blood disorders you have. Any surgeries you have had. Any medical conditions you have. Any problems you have had with having bowel movements. Whether you are pregnant or may be pregnant. What are the risks? Generally, this is a safe procedure. However, problems may occur, including: Bleeding. Damage to your intestine. Allergic reactions to medicines given during the procedure. Infection. This is rare. What happens before the procedure? Eating and drinking restrictions Follow instructions from your health care provider about eating or drinking restrictions, which may include: A few days before the procedure: Follow a low-fiber diet. Avoid nuts, seeds, dried fruit, raw fruits, and vegetables. 1-3 days before the procedure: Eat only gelatin dessert or ice pops. Drink only clear liquids, such as water, clear juice, clear broth or bouillon, black coffee or tea, or clear soft drinks or sports drinks. Avoid liquids that contain red or purple dye. The day of the procedure: Do not eat solid foods. You may continue to drink clear liquids until up to 2 hours before the procedure. Do not eat or drink anything starting 2 hours before the procedure, or within the time period that your health care provider recommends. Bowel prep If you were prescribed a bowel prep to take by mouth (orally) to clean out your colon: Take it as  told by your health care provider. Starting the day before your procedure, you will need to drink a large amount of liquid medicine. The liquid will cause you to have many bowel movements of loose stool until your stool becomes almost clear or light green. If your skin or the opening between the buttocks (anus) gets irritated from diarrhea, you may relieve the irritation using: Wipes with medicine in them, such as  adult wet wipes with aloe and vitamin E. A product to soothe skin, such as petroleum jelly. If you vomit while drinking the bowel prep: Take a break for up to 60 minutes. Begin the bowel prep again. Call your health care provider if you keep vomiting or you cannot take the bowel prep without vomiting. To clean out your colon, you may also be given: Laxative medicines. These help you have a bowel movement. Instructions for enema use. An enema is liquid medicine injected into your rectum. Medicines Ask your health care provider about: Changing or stopping your regular medicines or supplements. This is especially important if you are taking iron supplements, diabetes medicines, or blood thinners. Taking medicines such as aspirin and ibuprofen. These medicines can thin your blood. Do not take these medicines unless your health care provider tells you to take them. Taking over-the-counter medicines, vitamins, herbs, and supplements. General instructions Ask your health care provider what steps will be taken to help prevent infection. These may include washing skin with a germ-killing soap. Plan to have someone take you home from the hospital or clinic. What happens during the procedure?  An IV will be inserted into one of your veins. You may be given one or more of the following: A medicine to help you relax (sedative). A medicine to numb the area (local anesthetic). A medicine to make you fall asleep (general anesthetic). This is rarely needed. You will lie on your side with your knees bent. The tube will: Have oil or gel put on it (be lubricated). Be inserted into your anus. Be gently eased through all parts of your large intestine. Air will be sent into your colon to keep it open. This may cause some pressure or cramping. Images will be taken with the camera and will appear on a screen. A small tissue sample may be removed to be looked at under a microscope (biopsy). The tissue may be  sent to a lab for testing if any signs of problems are found. If small polyps are found, they may be removed and checked for cancer cells. When the procedure is finished, the tube will be removed. The procedure may vary among health care providers and hospitals. What happens after the procedure? Your blood pressure, heart rate, breathing rate, and blood oxygen level will be monitored until you leave the hospital or clinic. You may have a small amount of blood in your stool. You may pass gas and have mild cramping or bloating in your abdomen. This is caused by the air that was used to open your colon during the exam. Do not drive for 24 hours after the procedure. It is up to you to get the results of your procedure. Ask your health care provider, or the department that is doing the procedure, when your results will be ready. Summary A colonoscopy is a procedure to look at the entire large intestine. Follow instructions from your health care provider about eating and drinking before the procedure. If you were prescribed an oral bowel prep to clean out your colon, take it  as told by your health care provider. During the colonoscopy, a flexible tube with a camera on its end is inserted into the anus and then passed into the other parts of the large intestine. This information is not intended to replace advice given to you by your health care provider. Make sure you discuss any questions you have with your health care provider. Document Revised: 06/22/2019 Document Reviewed: 06/22/2019 Elsevier Patient Education  [place].

## 2021-11-03 NOTE — Telephone Encounter (Signed)
Received written documentation that Elizabeth [name] could hold her Xarelto for 2 days prior to procedure per Dr Gar Ponto at [place]

## 2021-11-04 ENCOUNTER — Encounter (HOSPITAL_COMMUNITY)
Admission: RE | Admit: 2021-11-04 | Discharge: 2021-11-04 | Disposition: A | Discharge status: Home / Self Care | Payer: 59 | Source: Ambulatory Visit | Attending: Internal Medicine | Admitting: Internal Medicine

## 2021-11-04 ENCOUNTER — Encounter (HOSPITAL_COMMUNITY): Payer: Self-pay

## 2021-11-04 DIAGNOSIS — Z01812 Encounter for preprocedural laboratory examination: Secondary | ICD-10-CM | POA: Insufficient documentation

## 2021-11-04 DIAGNOSIS — R197 Diarrhea, unspecified: Secondary | ICD-10-CM

## 2021-11-04 LAB — BASIC METABOLIC PANEL
Anion gap: 11 (ref 5–15)
BUN: 15 mg/dL (ref 8–23)
CO2: 23 mmol/L (ref 22–32)
Calcium: 8.2 mg/dL — ABNORMAL LOW (ref 8.9–10.3)
Chloride: 102 mmol/L (ref 98–111)
Creatinine, Ser: 0.86 mg/dL (ref 0.44–1.00)
GFR, Estimated: >60 mL/min (ref >60)
Glucose, Bld: 97 mg/dL (ref 70–99)
Potassium: 3.6 mmol/L (ref 3.5–5.1)
Sodium: 136 mmol/L (ref 135–145)

## 2021-11-06 ENCOUNTER — Ambulatory Visit (HOSPITAL_COMMUNITY): Payer: 59

## 2021-11-11 ENCOUNTER — Ambulatory Visit (HOSPITAL_COMMUNITY): Payer: 59 | Admitting: Anesthesiology

## 2021-11-11 ENCOUNTER — Ambulatory Visit (HOSPITAL_COMMUNITY)
Admission: RE | Admit: 2021-11-11 | Discharge: 2021-11-11 | Disposition: A | Discharge status: Home / Self Care | Payer: 59 | Source: Ambulatory Visit | Attending: Internal Medicine | Admitting: Internal Medicine

## 2021-11-11 ENCOUNTER — Encounter (HOSPITAL_COMMUNITY): Payer: Self-pay | Admitting: Internal Medicine

## 2021-11-11 ENCOUNTER — Encounter (HOSPITAL_COMMUNITY)
Admission: RE | Disposition: A | Discharge status: Home / Self Care | Payer: Self-pay | Source: Ambulatory Visit | Attending: Internal Medicine

## 2021-11-11 DIAGNOSIS — K635 Polyp of colon: Secondary | ICD-10-CM | POA: Diagnosis not present

## 2021-11-11 DIAGNOSIS — G709 Myoneural disorder, unspecified: Secondary | ICD-10-CM | POA: Insufficient documentation

## 2021-11-11 DIAGNOSIS — K644 Residual hemorrhoidal skin tags: Secondary | ICD-10-CM | POA: Insufficient documentation

## 2021-11-11 DIAGNOSIS — K6389 Other specified diseases of intestine: Secondary | ICD-10-CM | POA: Diagnosis not present

## 2021-11-11 DIAGNOSIS — K648 Other hemorrhoids: Secondary | ICD-10-CM | POA: Insufficient documentation

## 2021-11-11 DIAGNOSIS — K529 Noninfective gastroenteritis and colitis, unspecified: Secondary | ICD-10-CM | POA: Insufficient documentation

## 2021-11-11 DIAGNOSIS — Z980 Intestinal bypass and anastomosis status: Secondary | ICD-10-CM | POA: Diagnosis not present

## 2021-11-11 DIAGNOSIS — R197 Diarrhea, unspecified: Secondary | ICD-10-CM

## 2021-11-11 DIAGNOSIS — K219 Gastro-esophageal reflux disease without esophagitis: Secondary | ICD-10-CM | POA: Diagnosis not present

## 2021-11-11 DIAGNOSIS — K50012 Crohn's disease of small intestine with intestinal obstruction: Secondary | ICD-10-CM | POA: Insufficient documentation

## 2021-11-11 DIAGNOSIS — F17210 Nicotine dependence, cigarettes, uncomplicated: Secondary | ICD-10-CM | POA: Insufficient documentation

## 2021-11-11 DIAGNOSIS — K6289 Other specified diseases of anus and rectum: Secondary | ICD-10-CM | POA: Diagnosis not present

## 2021-11-11 DIAGNOSIS — I10 Essential (primary) hypertension: Secondary | ICD-10-CM | POA: Insufficient documentation

## 2021-11-11 HISTORY — PX: COLONOSCOPY WITH PROPOFOL: SHX5780

## 2021-11-11 HISTORY — PX: BIOPSY: SHX5522

## 2021-11-11 SURGERY — COLONOSCOPY WITH PROPOFOL
Anesthesia: General

## 2021-11-11 MED ORDER — LIDOCAINE HCL (PF) 2 % IJ SOLN
INTRAMUSCULAR | Status: AC
  Filled 2021-11-11: qty 5

## 2021-11-11 MED ORDER — LACTATED RINGERS IV SOLN: INTRAVENOUS | Status: DC

## 2021-11-11 MED ORDER — PROPOFOL 10 MG/ML IV BOLUS
INTRAVENOUS | Status: AC
  Filled 2021-11-11: qty 40

## 2021-11-11 MED ORDER — LIDOCAINE HCL 1 % IJ SOLN
INTRAMUSCULAR | Status: DC | PRN
  Administered 2021-11-11: 60 mg via INTRADERMAL

## 2021-11-11 MED ORDER — PROPOFOL 500 MG/50ML IV EMUL
INTRAVENOUS | Status: DC | PRN
  Administered 2021-11-11: 150 ug/kg/min via INTRAVENOUS

## 2021-11-11 MED ORDER — PROPOFOL 10 MG/ML IV BOLUS
INTRAVENOUS | Status: DC | PRN
  Administered 2021-11-11 (×3): 50 mg via INTRAVENOUS

## 2021-11-11 NOTE — Op Note (Signed)
[facility] Patient Name: Elizabeth [name] Procedure Date: 11/11/2021 1:52 PM MRN: [id number] Date of Birth: 09/26/1961 Attending MD: Hildred Laser , MD CSN: [id number] Age: 60 Admit Type: Outpatient Procedure:                Colonoscopy Indications:              Clinically significant diarrhea of unexplained                            origin, Crohn's disease of the small bowel Providers:                Hildred Laser, MD, Rosina Lowenstein, RN, Raphael Gibney,                            Technician Referring MD:             Gar Ponto, MD Medicines:                Propofol per Anesthesia Complications:            No immediate complications. Estimated Blood Loss:     Estimated blood loss was minimal. Procedure:                Pre-Anesthesia Assessment:                           - Prior to the procedure, a History and Physical                            was performed, and patient medications and                            allergies were reviewed. The patient's tolerance of                            previous anesthesia was also reviewed. The risks                            and benefits of the procedure and the sedation                            options and risks were discussed with the patient.                            All questions were answered, and informed consent                            was obtained. Prior Anticoagulants: The patient                            last took Xarelto (rivaroxaban) 3 days prior to the                            procedure. ASA Grade Assessment: III - A patient  with severe systemic disease. After reviewing the                            risks and benefits, the patient was deemed in                            satisfactory condition to undergo the procedure.                           After obtaining informed consent, the colonoscope                            was passed under direct vision. Throughout the                             procedure, the patient's blood pressure, pulse, and                            oxygen saturations were monitored continuously. The                            [id number]-HQ190L ([id number]) scope was introduced through                            the anus and advanced to the 5 cm into the ileum.                            The colonoscopy was performed without difficulty.                            The patient tolerated the procedure well. The                            quality of the bowel preparation was adequate to                            identify polyps. The terminal ileum and the rectum                            were photographed. Scope In: 2:26:59 PM Scope Out: 3:03:11 PM Scope Withdrawal Time: 0 hours 27 minutes 32 seconds  Total Procedure Duration: 0 hours 36 minutes 12 seconds  Findings:      Skin tags were found on perianal exam.      There was evidence of a prior end-to-end ileo-colonic anastomosis in the       proximal ascending colon. This was patent.      The neo-terminal ileum contained a benign-appearing, intrinsic moderate       stenosis that was non-traversed. Biopsies were taken with a cold forceps       for histology.      The descending colon, splenic flexure, transverse colon, hepatic flexure       and ascending colon appeared normal.      An area of moderately congested mucosa was found in the descending  colon. This was biopsied with a cold forceps for histology. The       pathology specimen was placed into Bottle Number 2.      There was evidence of a prior end-to-side colo-colonic anastomosis in       the recto-sigmoid colon. This was patent.      An area of mildly congested mucosa was found in the rectum and in the       sigmoid colon. This was biopsied with a cold forceps for histology. The       pathology specimen was placed into Bottle Number 3.      Internal hemorrhoids were found during retroflexion. The hemorrhoids       were small. Impression:                - Perianal skin tags found on perianal exam.                           - Patent end-to-end ileo-colonic anastomosis.                           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                           - The descending colon, splenic flexure, transverse                            colon, hepatic flexure and ascending colon are                            normal.                           - Focal area with congested mucosa in the                            descending colon. Biopsied.                           - Patent end-to-side colo-colonic anastomosis.                           - Congested mucosa in the rectum and in the sigmoid                            colon. Biopsied.                           - Internal hemorrhoids. Moderate Sedation:      Per Anesthesia Care Recommendation:           - Patient has a contact number available for                            emergencies. The signs and symptoms of potential  delayed complications were discussed with the                            patient. Return to normal activities tomorrow.                            Written discharge instructions were provided to the                            patient.                           - Resume previous diet today.                           - Continue present medications.                           - Resume Xarelto (rivaroxaban) at prior dose today.                           - Await pathology results.                           - Repeat colonoscopy is recommended. The                            colonoscopy date will be determined after pathology                            results from today's exam become available for                            review. Procedure Code(s):        --- Professional ---                           [phone], Colonoscopy, flexible; with biopsy, single                             or multiple Diagnosis Code(s):        --- Professional ---                           Z98.0, Intestinal bypass and anastomosis status                           K64.8, Other hemorrhoids                           K50.012, Crohn's disease of small intestine with                            intestinal obstruction                           K62.89, Other specified diseases of anus and rectum  K63.89, Other specified diseases of intestine                           K64.4, Residual hemorrhoidal skin tags                           R19.7, Diarrhea, unspecified CPT copyright 2019 American Medical Association. All rights reserved. The codes documented in this report are preliminary and upon coder review may  be revised to meet current compliance requirements. Hildred Laser, MD Hildred Laser, MD 11/11/2021 3:17:45 PM This report has been signed electronically. Number of Addenda: 0

## 2021-11-11 NOTE — Anesthesia Postprocedure Evaluation (Signed)
Anesthesia Post Note  Patient: Elizabeth [name]  Procedure(s) Performed: COLONOSCOPY WITH PROPOFOL BIOPSY  Patient location during evaluation: Phase II Anesthesia Type: General Level of consciousness: awake Pain management: pain level controlled Vital Signs Assessment: post-procedure vital signs reviewed and stable Respiratory status: spontaneous breathing and respiratory function stable Cardiovascular status: blood pressure returned to baseline and stable Postop Assessment: no headache and no apparent nausea or vomiting Anesthetic complications: no Comments: Late entry   No notable events documented.   Last Vitals:  Vitals:   11/11/21 1321 11/11/21 1506  BP: 95/61 (!) 108/92  Pulse: 81 72  Resp: (!) 28 18  Temp: 37.1 C 36.7 C  SpO2: 99% 99%    Last Pain:  Vitals:   11/11/21 1506  TempSrc: Oral  PainSc: 0-No pain                 Louann Sjogren

## 2021-11-11 NOTE — Transfer of Care (Signed)
Immediate Anesthesia Transfer of Care Note  Patient: Elizabeth [name]  Procedure(s) Performed: COLONOSCOPY WITH PROPOFOL BIOPSY  Patient Location: Short Stay  Anesthesia Type:General  Level of Consciousness: awake and alert   Airway & Oxygen Therapy: Patient Spontanous Breathing  Post-op Assessment: Report given to RN and Post -op Vital signs reviewed and stable  Post vital signs: Reviewed and stable  Last Vitals:  Vitals Value Taken Time  BP    Temp    Pulse    Resp    SpO2      Last Pain:  Vitals:   11/11/21 1502  PainSc: 0-No pain         Complications: No notable events documented.

## 2021-11-11 NOTE — Discharge Instructions (Addendum)
Resume Xarelto on 11/12/2021 Resume other medications as before. Resume usual diet. No driving for 24 hours. Physician will call with biopsy results.

## 2021-11-11 NOTE — H&P (Signed)
Elizabeth [name] is an 60 y.o. female.   Chief Complaint: Patient is here for colonoscopy. HPI: Patient is 60 year old Caucasian female with complicated GI history.  She has several year history of Crohn's disease with resection of small bowel as well as history of complicated sigmoid diverticulitis leading to sigmoid colon resection who is been maintained on Humira.  She has been experiencing diarrhea for the last 2 to 3 weeks.  Stool studies were negative.  She is not experiencing melena or rectal bleeding.  She does complain of intermittent pain in right upper quadrant.  Her appetite is good and she has not lost weight.  She has not had good response to symptomatic therapy.  She is undergoing diagnostic colonoscopy. Xarelto is on hold.  Past Medical History:  Diagnosis Date   Bronchitis    C. difficile diarrhea    Cough 01/28/2014   upper airway cough syndrome   Crohn's disease ([place])    Diverticulitis    DVT 06/2020   history of   Elevated transaminase level    GERD (gastroesophageal reflux disease)    Hypertension    Obesity    Pneumonia 12/2013   Tobacco abuse     Past Surgical History:  Procedure Laterality Date   ABDOMINAL HYSTERECTOMY  12/13/1978   APPENDECTOMY     COLON SURGERY     COLONOSCOPY     COLONOSCOPY N/A 08/16/2014   Procedure: COLONOSCOPY;  Surgeon: Rogene Houston, MD;  [facility]: [facility];  Service: Endoscopy;  Laterality: N/A;  1200-rescheduled to 9/4 @ 10:45 Ann notified pt   ESOPHAGOGASTRODUODENOSCOPY  06/16/2012   Procedure: ESOPHAGOGASTRODUODENOSCOPY (EGD);  Surgeon: Rogene Houston, MD;  Location: [facility];  Service: Endoscopy;  Laterality: N/A;  10:30 AM   hemocolectomy  12/14/2003   right   Illeocecal Resection  2005   INCISIONAL HERNIA REPAIR N/A 08/24/2021   Procedure: HERNIA REPAIR INCISIONAL WITH MESH;  Surgeon: Aviva Signs, MD;  Location: [facility];  Service: General;  Laterality: N/A;   LAPAROSCOPIC SIGMOID COLECTOMY N/A 01/07/2015    Procedure: LOW ANTERIOR COLON RESECTION;  Surgeon: Fanny Skates, MD;  Location: [facility];  Service: General;  Laterality: N/A;   SHOULDER ARTHROSCOPY WITH LABRAL REPAIR Right 01/20/2016   Procedure: SHOULDER ARTHROSCOPY WITH LABRAL REPAIR;  Surgeon: Meredith Pel, MD;  Location: [facility];  Service: Orthopedics;  Laterality: Right;    Family History  Problem Relation Age of Onset   Healthy Mother    Lung cancer Father        smoked   Allergic Disorder Brother    Healthy Daughter    Social History:  reports that she has been smoking cigarettes. She has a 20.00 pack-year smoking history. She has never used smokeless tobacco. She reports that she does not currently use alcohol. She reports that she does not use drugs.  Allergies:  Allergies  Allergen Reactions   Contrast Media [Iodinated Diagnostic Agents] Hives and Rash   Penicillins Hives and Rash             Medications Prior to Admission  Medication Sig Dispense Refill   Adalimumab (HUMIRA) 40 MG/0.4ML PSKT Inject 40 mg into the skin every 14 (fourteen) days. Patient will start the maintenance Tuesday February 22,2022 2 each 5   albuterol (VENTOLIN HFA) 108 (90 Base) MCG/ACT inhaler Inhale 2 puffs into the lungs every 6 (six) hours as needed for wheezing or shortness of breath.     omeprazole (PRILOSEC) 40 MG capsule Take  40 mg by mouth daily before breakfast.     ondansetron (ZOFRAN-ODT) 8 MG disintegrating tablet Take 1 tablet (8 mg total) by mouth 2 (two) times daily as needed. 20 tablet 2   potassium chloride (MICRO-K) 10 MEQ CR capsule Take 10 mEq by mouth 2 (two) times daily.      promethazine (PHENERGAN) 25 MG tablet Take one bid prn 20 tablet 0   rivaroxaban (XARELTO) 20 MG TABS tablet Take 20 mg by mouth daily with supper.     valsartan-hydrochlorothiazide (DIOVAN-HCT) 160-12.5 MG per tablet Take 1 tablet by mouth daily.      Cholecalciferol (VITAMIN D3) 50 MCG (2000 UT) TABS TAKE 1 TABLET BY MOUTH EVERY DAY (Patient not  taking: Reported on 11/02/2021) 90 tablet 1   cholestyramine (QUESTRAN) 4 g packet Take 1 packet (4 g total) by mouth 2 (two) times daily. Do not eat within 2 hours before or 2 hours after taking med (Patient not taking: Reported on 11/02/2021) 60 each 2   colestipol (COLESTID) 1 g tablet Start with 2 tablets once daily for one week, then may increase to 2 tabs bid if needed. Do not take within 2 hours of other meds. (Patient taking differently: Take 1 g by mouth 2 (two) times daily.) 120 tablet 2   dicyclomine (BENTYL) 10 MG capsule Take 1 capsule (10 mg total) by mouth 3 (three) times daily as needed for spasms. (Patient not taking: Reported on 11/02/2021) 60 capsule 5   fluticasone (FLONASE) 50 MCG/ACT nasal spray Place 2 sprays into both nostrils daily as needed for allergies or rhinitis.     furosemide (LASIX) 40 MG tablet Take 40 mg by mouth daily as needed for edema.     HYDROcodone-acetaminophen (NORCO) 5-325 MG tablet Take 1 tablet by mouth every 4 (four) hours as needed for moderate pain. (Patient not taking: Reported on 11/02/2021) 30 tablet 0   mesalamine (PENTASA) 500 MG CR capsule Take 1,000 mg by mouth 4 (four) times daily.  (Patient not taking: Reported on 10/06/2021)     polyethylene glycol-electrolytes (TRILYTE) 420 g solution Take 4,000 mLs by mouth as directed. 4000 mL 0   predniSONE (DELTASONE) 10 MG tablet Use as directed associated daily for at least 12 hours in summary (Patient not taking: Reported on 11/02/2021) 15 tablet 0   vancomycin (VANCOCIN) 125 MG capsule Take 1 capsule (125 mg total) by mouth 4 (four) times daily. (Patient not taking: Reported on 11/02/2021) 56 capsule 0    No results found for this or any previous visit (from the past 48 hour(s)). No results found.  Review of Systems  Blood pressure 95/61, pulse 81, temperature 98.7 F (37.1 C), resp. rate (!) 28, SpO2 99 %. Physical Exam HENT:     Mouth/Throat:     Mouth: Mucous membranes are moist.      Pharynx: Oropharynx is clear.  Eyes:     General: No scleral icterus.    Conjunctiva/sclera: Conjunctivae normal.  Cardiovascular:     Rate and Rhythm: Normal rate and regular rhythm.     Heart sounds: Normal heart sounds. No murmur heard. Pulmonary:     Effort: Pulmonary effort is normal.     Breath sounds: Normal breath sounds.  Abdominal:     General: There is no distension.     Palpations: Abdomen is soft. There is no mass.     Tenderness: There is no abdominal tenderness.  Musculoskeletal:     Cervical back: Neck supple. No rigidity.  Neurological:     Mental Status: She is alert.     Assessment/Plan  Unexplained diarrhea and patient with known history of Crohn's disease. Stool studies been negative. Diagnostic colonoscopy.  Hildred Laser, MD 11/11/2021, 2:16 PM

## 2021-11-11 NOTE — Anesthesia Preprocedure Evaluation (Signed)
Anesthesia Evaluation  Patient identified by MRN, date of birth, ID band Patient awake    Reviewed: Allergy & Precautions, H&P , NPO status , Patient's Chart, lab work & pertinent test results, reviewed documented beta blocker date and time   Airway Mallampati: II  TM Distance: >3 FB Neck ROM: full    Dental no notable dental hx.    Pulmonary neg pulmonary ROS, Current Smoker,    Pulmonary exam normal breath sounds clear to auscultation       Cardiovascular Exercise Tolerance: Good hypertension, negative cardio ROS   Rhythm:regular Rate:Normal     Neuro/Psych  Neuromuscular disease negative psych ROS   GI/Hepatic Neg liver ROS, GERD  Medicated,  Endo/Other  negative endocrine ROS  Renal/GU negative Renal ROS  negative genitourinary   Musculoskeletal   Abdominal   Peds  Hematology negative hematology ROS (+)   Anesthesia Other Findings   Reproductive/Obstetrics negative OB ROS                             Anesthesia Physical Anesthesia Plan  ASA: 3  Anesthesia Plan: General   Post-op Pain Management:    Induction:   PONV Risk Score and Plan: Propofol infusion  Airway Management Planned:   Additional Equipment:   Intra-op Plan:   Post-operative Plan:   Informed Consent: I have reviewed the patients History and Physical, chart, labs and discussed the procedure including the risks, benefits and alternatives for the proposed anesthesia with the patient or authorized representative who has indicated his/her understanding and acceptance.     Dental Advisory Given  Plan Discussed with: CRNA  Anesthesia Plan Comments:         Anesthesia Quick Evaluation

## 2021-11-13 ENCOUNTER — Other Ambulatory Visit (INDEPENDENT_AMBULATORY_CARE_PROVIDER_SITE_OTHER): Payer: Self-pay | Admitting: Internal Medicine

## 2021-11-13 LAB — SURGICAL PATHOLOGY

## 2021-11-13 MED ORDER — MESALAMINE ER 500 MG PO CPCR: 500.0000 mg | ORAL_CAPSULE | Freq: Four times a day (QID) | ORAL | 5 refills | Status: DC

## 2021-11-16 ENCOUNTER — Encounter (HOSPITAL_COMMUNITY): Payer: Self-pay | Admitting: Internal Medicine

## 2021-11-17 ENCOUNTER — Telehealth (INDEPENDENT_AMBULATORY_CARE_PROVIDER_SITE_OTHER): Payer: Self-pay | Admitting: *Deleted

## 2021-11-17 NOTE — Telephone Encounter (Signed)
Per dr Laural Golden - pt needs office visit in 3 months ( around march 6th)

## 2021-11-21 ENCOUNTER — Other Ambulatory Visit (INDEPENDENT_AMBULATORY_CARE_PROVIDER_SITE_OTHER): Payer: Self-pay | Admitting: Internal Medicine

## 2021-11-23 NOTE — Telephone Encounter (Signed)
Last office visit 10/06/21

## 2021-12-19 ENCOUNTER — Other Ambulatory Visit (INDEPENDENT_AMBULATORY_CARE_PROVIDER_SITE_OTHER): Payer: Self-pay | Admitting: Internal Medicine

## 2021-12-19 DIAGNOSIS — K5000 Crohn's disease of small intestine without complications: Secondary | ICD-10-CM

## 2021-12-20 ENCOUNTER — Inpatient Hospital Stay (HOSPITAL_COMMUNITY)
Admission: EM | Admit: 2021-12-20 | Discharge: 2021-12-22 | DRG: 386 | Disposition: A | Discharge status: Home / Self Care | Payer: 59 | Attending: Internal Medicine | Admitting: Internal Medicine

## 2021-12-20 ENCOUNTER — Encounter (HOSPITAL_COMMUNITY): Payer: Self-pay | Admitting: Emergency Medicine

## 2021-12-20 DIAGNOSIS — I10 Essential (primary) hypertension: Secondary | ICD-10-CM | POA: Diagnosis present

## 2021-12-20 DIAGNOSIS — D7589 Other specified diseases of blood and blood-forming organs: Secondary | ICD-10-CM | POA: Diagnosis present

## 2021-12-20 DIAGNOSIS — Z801 Family history of malignant neoplasm of trachea, bronchus and lung: Secondary | ICD-10-CM

## 2021-12-20 DIAGNOSIS — F17210 Nicotine dependence, cigarettes, uncomplicated: Secondary | ICD-10-CM | POA: Diagnosis present

## 2021-12-20 DIAGNOSIS — K5080 Crohn's disease of both small and large intestine without complications: Secondary | ICD-10-CM | POA: Diagnosis not present

## 2021-12-20 DIAGNOSIS — E669 Obesity, unspecified: Secondary | ICD-10-CM | POA: Diagnosis present

## 2021-12-20 DIAGNOSIS — I959 Hypotension, unspecified: Secondary | ICD-10-CM | POA: Diagnosis present

## 2021-12-20 DIAGNOSIS — K5900 Constipation, unspecified: Secondary | ICD-10-CM | POA: Diagnosis present

## 2021-12-20 DIAGNOSIS — Z91041 Radiographic dye allergy status: Secondary | ICD-10-CM

## 2021-12-20 DIAGNOSIS — K5010 Crohn's disease of large intestine without complications: Secondary | ICD-10-CM | POA: Diagnosis present

## 2021-12-20 DIAGNOSIS — E876 Hypokalemia: Secondary | ICD-10-CM | POA: Diagnosis not present

## 2021-12-20 DIAGNOSIS — Z9049 Acquired absence of other specified parts of digestive tract: Secondary | ICD-10-CM

## 2021-12-20 DIAGNOSIS — Z880 Allergy status to penicillin: Secondary | ICD-10-CM

## 2021-12-20 DIAGNOSIS — Z20822 Contact with and (suspected) exposure to covid-19: Secondary | ICD-10-CM | POA: Diagnosis present

## 2021-12-20 DIAGNOSIS — K921 Melena: Secondary | ICD-10-CM | POA: Diagnosis not present

## 2021-12-20 DIAGNOSIS — K529 Noninfective gastroenteritis and colitis, unspecified: Secondary | ICD-10-CM

## 2021-12-20 DIAGNOSIS — Z6833 Body mass index (BMI) 33.0-33.9, adult: Secondary | ICD-10-CM

## 2021-12-20 DIAGNOSIS — F419 Anxiety disorder, unspecified: Secondary | ICD-10-CM | POA: Diagnosis present

## 2021-12-20 DIAGNOSIS — Z28310 Unvaccinated for covid-19: Secondary | ICD-10-CM

## 2021-12-20 DIAGNOSIS — Z90710 Acquired absence of both cervix and uterus: Secondary | ICD-10-CM

## 2021-12-20 DIAGNOSIS — Z7901 Long term (current) use of anticoagulants: Secondary | ICD-10-CM

## 2021-12-20 DIAGNOSIS — K219 Gastro-esophageal reflux disease without esophagitis: Secondary | ICD-10-CM | POA: Diagnosis present

## 2021-12-20 DIAGNOSIS — R1032 Left lower quadrant pain: Secondary | ICD-10-CM

## 2021-12-20 DIAGNOSIS — Z720 Tobacco use: Secondary | ICD-10-CM | POA: Diagnosis present

## 2021-12-20 DIAGNOSIS — Z79899 Other long term (current) drug therapy: Secondary | ICD-10-CM

## 2021-12-20 DIAGNOSIS — Z86718 Personal history of other venous thrombosis and embolism: Secondary | ICD-10-CM

## 2021-12-20 MED ORDER — ONDANSETRON HCL 4 MG/2ML IJ SOLN
4.0000 mg | Freq: Once | INTRAMUSCULAR | Status: AC
  Administered 2021-12-21: 4 mg via INTRAVENOUS
  Filled 2021-12-20: qty 2

## 2021-12-20 MED ORDER — MORPHINE SULFATE (PF) 4 MG/ML IV SOLN
4.0000 mg | Freq: Once | INTRAVENOUS | Status: AC
  Administered 2021-12-21: 4 mg via INTRAVENOUS
  Filled 2021-12-20: qty 1

## 2021-12-20 NOTE — ED Triage Notes (Signed)
Pt c/o N/V/D since earlier today. Pt also states she thinks she may have had some rectal bleeding as well.

## 2021-12-20 NOTE — ED Provider Notes (Signed)
[facility] EMERGENCY DEPARTMENT Provider Note   CSN: [id number] Arrival date & time: 12/20/21  2328     History  Chief Complaint  Patient presents with   Abdominal Pain    Elizabeth [name] is a 61 y.o. female.  The history is provided by the patient.  Abdominal Pain She has history of Crohn's disease, GERD, hypertension and comes in with vomiting and pain across her lower abdomen.  She was constipated yesterday and took a dose of bisacodyl.  She started having some lower abdominal pain yesterday which got worse today.  She started vomiting about 2 PM and has vomited numerous times.  She also has been straining at her bowels and has passed a small amount of blood.  Abdominal pain is severe and she rates it at 10/10.  She has not been able to take anything for it because of emesis.  She denies fever, chills, sweats.   Home Medications Prior to Admission medications   Medication Sig Start Date End Date Taking? Authorizing Provider  Adalimumab (HUMIRA) 40 MG/0.4ML PSKT Inject 40 mg into the skin every 14 (fourteen) days. Patient will start the maintenance Tuesday February 22,2022 07/14/21   Rogene Houston, MD  albuterol (VENTOLIN HFA) 108 (90 Base) MCG/ACT inhaler Inhale 2 puffs into the lungs every 6 (six) hours as needed for wheezing or shortness of breath. 11/09/19   [provider]  Cholecalciferol (VITAMIN D3) 50 MCG (2000 UT) TABS TAKE 1 TABLET BY MOUTH EVERY DAY Patient not taking: Reported on 11/02/2021 04/28/21   Rogene Houston, MD  colestipol (COLESTID) 1 g tablet Start with 2 tablets once daily for one week, then may increase to 2 tabs bid if needed. Do not take within 2 hours of other meds. Patient taking differently: Take 1 g by mouth 2 (two) times daily. 09/23/21   Rehman, Mechele Dawley, MD  dicyclomine (BENTYL) 10 MG capsule Take 1 capsule (10 mg total) by mouth 3 (three) times daily as needed for spasms. Patient not taking: Reported on 11/02/2021 01/27/21   Rogene Houston, MD  fluticasone [facility]) 50 MCG/ACT nasal spray Place 2 sprays into both nostrils daily as needed for allergies or rhinitis.    [provider]  furosemide (LASIX) 40 MG tablet Take 40 mg by mouth daily as needed for edema.    [provider]  mesalamine (PENTASA) 500 MG CR capsule Take 1 capsule (500 mg total) by mouth 4 (four) times daily. 11/13/21   Rogene Houston, MD  omeprazole (PRILOSEC) 40 MG capsule Take 40 mg by mouth daily before breakfast.    [provider]  ondansetron (ZOFRAN-ODT) 8 MG disintegrating tablet TAKE 1 TABLET (8 MG TOTAL) BY MOUTH 2 (TWO) TIMES DAILY AS NEEDED. 11/23/21   Carlan, Chelsea L, NP  potassium chloride (MICRO-K) 10 MEQ CR capsule Take 10 mEq by mouth 2 (two) times daily.     [provider]  promethazine (PHENERGAN) 25 MG tablet Take one bid prn 09/23/21   Rogene Houston, MD  rivaroxaban (XARELTO) 20 MG TABS tablet Take 1 tablet (20 mg total) by mouth daily with supper. 11/12/21   Rehman, Mechele Dawley, MD  valsartan-hydrochlorothiazide (DIOVAN-HCT) 160-12.5 MG per tablet Take 1 tablet by mouth daily.     [provider]      Allergies    Contrast media [iodinated contrast media] and Penicillins    Review of Systems   Review of Systems  Gastrointestinal:  Positive for abdominal pain.  All other systems reviewed and are negative.  Physical Exam Updated Vital Signs BP (!) 157/128 (BP Location: Right Arm)    Pulse 87    Resp (!) 28    Ht 5' 7"  (1.702 m)    Wt 98.4 kg    SpO2 99%    BMI 33.99 kg/m  Physical Exam Vitals and nursing note reviewed.  61 year old female, appears uncomfortable, but is in no acute distress. Vital signs are significant for elevated blood pressure and respiratory rate. Oxygen saturation is 99%, which is normal. Head is normocephalic and atraumatic. PERRLA, EOMI. Oropharynx is clear. Neck is nontender and supple without adenopathy or JVD. Back is nontender and there is no CVA  tenderness. Lungs are clear without rales, wheezes, or rhonchi. Chest is nontender. Heart has regular rate and rhythm without murmur. Abdomen is soft, flat, with mild to moderate tenderness across the lower abdomen.  There is no focal area of tenderness.  There is no rebound or guarding.  There are no masses or hepatosplenomegaly and peristalsis is hypoactive. Extremities have no cyanosis or edema, full range of motion is present. Skin is warm and dry without rash. Neurologic: Mental status is normal, cranial nerves are intact, there are no motor or sensory deficits.  ED Results / Procedures / Treatments   Labs (all labs ordered are listed, but only abnormal results are displayed) Labs Reviewed - No data to display  EKG EKG Interpretation  Date/Time:  Sunday December 20 2021 23:37:52 EST Ventricular Rate:  88 PR Interval:  143 QRS Duration: 104 QT Interval:  398 QTC Calculation: 482 R Axis:   4 Text Interpretation: Sinus rhythm Low voltage, precordial leads Abnormal inferior Q waves Nonspecific T abnormalities, anterior leads When compared with ECG of 08/20/2021, No significant change was found Confirmed by Delora Fuel ([id number]) on 12/20/2021 11:42:35 PM  Radiology No results found.  Procedures Procedures  Cardiac monitor shows sinus rhythm with occasional PVC per my interpretation.  Medications Ordered in ED Medications  potassium chloride 10 mEq in 100 mL IVPB ( Intravenous Rate/Dose Change 12/21/21 0520)  methylPREDNISolone sodium succinate (SOLU-MEDROL) 125 mg/2 mL injection 125 mg (125 mg Intravenous Not Given 12/21/21 0528)  ciprofloxacin (CIPRO) IVPB 400 mg (has no administration in time range)  metroNIDAZOLE (FLAGYL) IVPB 500 mg (has no administration in time range)  magnesium sulfate IVPB 2 g 50 mL (has no administration in time range)  morphine 4 MG/ML injection 4 mg (4 mg Intravenous Given 12/21/21 0001)  ondansetron (ZOFRAN) injection 4 mg (4 mg Intravenous Given 12/21/21 0001)   morphine 4 MG/ML injection 4 mg (4 mg Intravenous Given 12/21/21 0144)  potassium chloride SA (KLOR-CON M) CR tablet 40 mEq (40 mEq Oral Given 12/21/21 0429)  lactated ringers bolus 2,000 mL (2,000 mLs Intravenous New Bag/Given 12/21/21 0528)  methylPREDNISolone sodium succinate (SOLU-MEDROL) 125 mg/2 mL injection 125 mg (125 mg Intravenous Given 12/21/21 0528)    ED Course/ Medical Decision Making/ A&P                           Medical Decision Making  Abdominal pain and vomiting in a patient with Crohn's disease concerning for exacerbation of Crohn's disease, possible bowel obstruction.  Consider viral gastritis.  Old records are reviewed confirming history of Crohn's disease with prior right hemicolectomy.  She will be given IV fluids, morphine, ondansetron and will send for CT of abdomen and pelvis.  She continues to complain of  abdominal pain and did require second dose of morphine.  CT scan was consistent with colitis with inflammatory changes in the descending colon.  I suspect that this actually is an exacerbation of her Crohn's disease.  Lactic acid level is normal.  She is noted to be markedly hypokalemic with potassium 2.6 and is given both oral and intravenous potassium.  Magnesium was checked and is low at 1.2 and she is given intravenous magnesium.  Patient had an episode of hypotension which responded well to IV fluids.  Given hypotension and CT findings of colitis, she is started on antibiotics of ciprofloxacin and metronidazole.  She is also given a dose of methylprednisolone to treat possible Crohn's flareup.  Case is discussed with Dr. Clearence Ped of [facility], who agrees to admit the patient.  CRITICAL CARE Performed by: Delora Fuel Total critical care time: 60 minutes Critical care time was exclusive of separately billable procedures and treating other patients. Critical care was necessary to treat or prevent imminent or life-threatening deterioration. Critical care was  time spent personally by me on the following activities: development of treatment plan with patient and/or surrogate as well as nursing, discussions with consultants, evaluation of patient's response to treatment, examination of patient, obtaining history from patient or surrogate, ordering and performing treatments and interventions, ordering and review of laboratory studies, ordering and review of radiographic studies, pulse oximetry and re-evaluation of patient's condition.       Final Clinical Impression(s) / ED Diagnoses Final diagnoses:  Colitis  Hypokalemia  Hypomagnesemia  Macrocytosis without anemia    Rx / DC Orders ED Discharge Orders     None         Delora Fuel, MD 28/00/34 [phone]

## 2021-12-21 ENCOUNTER — Other Ambulatory Visit: Payer: Self-pay

## 2021-12-21 ENCOUNTER — Encounter (HOSPITAL_COMMUNITY): Payer: Self-pay | Admitting: Family Medicine

## 2021-12-21 ENCOUNTER — Emergency Department (HOSPITAL_COMMUNITY): Payer: 59

## 2021-12-21 DIAGNOSIS — E669 Obesity, unspecified: Secondary | ICD-10-CM | POA: Diagnosis present

## 2021-12-21 DIAGNOSIS — I959 Hypotension, unspecified: Secondary | ICD-10-CM | POA: Diagnosis present

## 2021-12-21 DIAGNOSIS — Z79899 Other long term (current) drug therapy: Secondary | ICD-10-CM | POA: Diagnosis not present

## 2021-12-21 DIAGNOSIS — Z90710 Acquired absence of both cervix and uterus: Secondary | ICD-10-CM | POA: Diagnosis not present

## 2021-12-21 DIAGNOSIS — Z7901 Long term (current) use of anticoagulants: Secondary | ICD-10-CM | POA: Diagnosis not present

## 2021-12-21 DIAGNOSIS — K5010 Crohn's disease of large intestine without complications: Secondary | ICD-10-CM | POA: Diagnosis present

## 2021-12-21 DIAGNOSIS — E876 Hypokalemia: Secondary | ICD-10-CM | POA: Diagnosis present

## 2021-12-21 DIAGNOSIS — K219 Gastro-esophageal reflux disease without esophagitis: Secondary | ICD-10-CM | POA: Diagnosis present

## 2021-12-21 DIAGNOSIS — K50119 Crohn's disease of large intestine with unspecified complications: Secondary | ICD-10-CM | POA: Diagnosis not present

## 2021-12-21 DIAGNOSIS — K921 Melena: Secondary | ICD-10-CM | POA: Diagnosis not present

## 2021-12-21 DIAGNOSIS — D7589 Other specified diseases of blood and blood-forming organs: Secondary | ICD-10-CM | POA: Diagnosis present

## 2021-12-21 DIAGNOSIS — R1032 Left lower quadrant pain: Secondary | ICD-10-CM | POA: Diagnosis not present

## 2021-12-21 DIAGNOSIS — Z20822 Contact with and (suspected) exposure to covid-19: Secondary | ICD-10-CM | POA: Diagnosis present

## 2021-12-21 DIAGNOSIS — I10 Essential (primary) hypertension: Secondary | ICD-10-CM | POA: Diagnosis present

## 2021-12-21 DIAGNOSIS — F419 Anxiety disorder, unspecified: Secondary | ICD-10-CM | POA: Diagnosis present

## 2021-12-21 DIAGNOSIS — K5080 Crohn's disease of both small and large intestine without complications: Secondary | ICD-10-CM | POA: Diagnosis present

## 2021-12-21 DIAGNOSIS — K5900 Constipation, unspecified: Secondary | ICD-10-CM | POA: Diagnosis present

## 2021-12-21 DIAGNOSIS — F17210 Nicotine dependence, cigarettes, uncomplicated: Secondary | ICD-10-CM | POA: Diagnosis present

## 2021-12-21 DIAGNOSIS — Z801 Family history of malignant neoplasm of trachea, bronchus and lung: Secondary | ICD-10-CM | POA: Diagnosis not present

## 2021-12-21 DIAGNOSIS — K529 Noninfective gastroenteritis and colitis, unspecified: Secondary | ICD-10-CM | POA: Diagnosis not present

## 2021-12-21 DIAGNOSIS — K50111 Crohn's disease of large intestine with rectal bleeding: Secondary | ICD-10-CM | POA: Diagnosis not present

## 2021-12-21 DIAGNOSIS — Z6833 Body mass index (BMI) 33.0-33.9, adult: Secondary | ICD-10-CM | POA: Diagnosis not present

## 2021-12-21 DIAGNOSIS — Z91041 Radiographic dye allergy status: Secondary | ICD-10-CM | POA: Diagnosis not present

## 2021-12-21 DIAGNOSIS — Z9049 Acquired absence of other specified parts of digestive tract: Secondary | ICD-10-CM | POA: Diagnosis not present

## 2021-12-21 DIAGNOSIS — Z720 Tobacco use: Secondary | ICD-10-CM

## 2021-12-21 DIAGNOSIS — Z86718 Personal history of other venous thrombosis and embolism: Secondary | ICD-10-CM | POA: Diagnosis not present

## 2021-12-21 DIAGNOSIS — Z880 Allergy status to penicillin: Secondary | ICD-10-CM | POA: Diagnosis not present

## 2021-12-21 DIAGNOSIS — Z28310 Unvaccinated for covid-19: Secondary | ICD-10-CM | POA: Diagnosis not present

## 2021-12-21 LAB — CBC WITH DIFFERENTIAL/PLATELET
Abs Immature Granulocytes: 0.1 10*3/uL — ABNORMAL HIGH (ref 0.00–0.07)
Basophils Absolute: 0 10*3/uL (ref 0.0–0.1)
Basophils Relative: 0 %
Eosinophils Absolute: 0 10*3/uL (ref 0.0–0.5)
Eosinophils Relative: 0 %
HCT: 41.7 % (ref 36.0–46.0)
Hemoglobin: 15.1 g/dL — ABNORMAL HIGH (ref 12.0–15.0)
Immature Granulocytes: 1 %
Lymphocytes Relative: 9 %
Lymphs Abs: 1.1 10*3/uL (ref 0.7–4.0)
MCH: 38.4 pg — ABNORMAL HIGH (ref 26.0–34.0)
MCHC: 36.2 g/dL — ABNORMAL HIGH (ref 30.0–36.0)
MCV: 106.1 fL — ABNORMAL HIGH (ref 80.0–100.0)
Monocytes Absolute: 0.4 10*3/uL (ref 0.1–1.0)
Monocytes Relative: 4 %
Neutro Abs: 11 10*3/uL — ABNORMAL HIGH (ref 1.7–7.7)
Neutrophils Relative %: 86 %
Platelets: 292 10*3/uL (ref 150–400)
RBC: 3.93 MIL/uL (ref 3.87–5.11)
RDW: 14.3 % (ref 11.5–15.5)
WBC: 12.6 10*3/uL — ABNORMAL HIGH (ref 4.0–10.5)
nRBC: 0 % (ref 0.0–0.2)

## 2021-12-21 LAB — SEDIMENTATION RATE: Sed Rate: 11 mm/hr (ref 0–22)

## 2021-12-21 LAB — COMPREHENSIVE METABOLIC PANEL
ALT: 15 U/L (ref 0–44)
AST: 21 U/L (ref 15–41)
Albumin: 3.2 g/dL — ABNORMAL LOW (ref 3.5–5.0)
Alkaline Phosphatase: 58 U/L (ref 38–126)
Anion gap: 10 (ref 5–15)
BUN: 23 mg/dL (ref 8–23)
CO2: 22 mmol/L (ref 22–32)
Calcium: 7.8 mg/dL — ABNORMAL LOW (ref 8.9–10.3)
Chloride: 105 mmol/L (ref 98–111)
Creatinine, Ser: 0.8 mg/dL (ref 0.44–1.00)
GFR, Estimated: >60 mL/min (ref >60)
Glucose, Bld: 133 mg/dL — ABNORMAL HIGH (ref 70–99)
Potassium: 2.6 mmol/L — CL (ref 3.5–5.1)
Sodium: 137 mmol/L (ref 135–145)
Total Bilirubin: 1.3 mg/dL — ABNORMAL HIGH (ref 0.3–1.2)
Total Protein: 6.1 g/dL — ABNORMAL LOW (ref 6.5–8.1)

## 2021-12-21 LAB — POTASSIUM: Potassium: 4.2 mmol/L (ref 3.5–5.1)

## 2021-12-21 LAB — RESP PANEL BY RT-PCR (FLU A&B, COVID) ARPGX2
Influenza A by PCR: NEGATIVE
Influenza B by PCR: NEGATIVE
SARS Coronavirus 2 by RT PCR: NEGATIVE

## 2021-12-21 LAB — MAGNESIUM
Magnesium: 1.2 mg/dL — ABNORMAL LOW (ref 1.7–2.4)
Magnesium: 1.8 mg/dL (ref 1.7–2.4)

## 2021-12-21 LAB — MRSA NEXT GEN BY PCR, NASAL: MRSA by PCR Next Gen: NOT DETECTED

## 2021-12-21 LAB — HIV ANTIBODY (ROUTINE TESTING W REFLEX): HIV Screen 4th Generation wRfx: NONREACTIVE

## 2021-12-21 LAB — C-REACTIVE PROTEIN: CRP: 1.7 mg/dL — ABNORMAL HIGH (ref <1.0)

## 2021-12-21 LAB — LACTIC ACID, PLASMA
Lactic Acid, Venous: 1.2 mmol/L (ref 0.5–1.9)
Lactic Acid, Venous: 1.4 mmol/L (ref 0.5–1.9)

## 2021-12-21 MED ORDER — ONDANSETRON HCL 4 MG PO TABS
4.0000 mg | ORAL_TABLET | Freq: Four times a day (QID) | ORAL | Status: DC | PRN
  Administered 2021-12-21 – 2021-12-22 (×2): 4 mg via ORAL
  Filled 2021-12-21 (×2): qty 1

## 2021-12-21 MED ORDER — CHLORHEXIDINE GLUCONATE CLOTH 2 % EX PADS
6.0000 | MEDICATED_PAD | Freq: Every day | CUTANEOUS | Status: DC
  Administered 2021-12-21 – 2021-12-22 (×2): 6 via TOPICAL

## 2021-12-21 MED ORDER — PANTOPRAZOLE SODIUM 40 MG PO TBEC
40.0000 mg | DELAYED_RELEASE_TABLET | Freq: Every day | ORAL | Status: DC
  Administered 2021-12-21 – 2021-12-22 (×2): 40 mg via ORAL
  Filled 2021-12-21 (×2): qty 1

## 2021-12-21 MED ORDER — SODIUM CHLORIDE 0.9 % IV SOLN: INTRAVENOUS | Status: DC

## 2021-12-21 MED ORDER — CIPROFLOXACIN IN D5W 400 MG/200ML IV SOLN
400.0000 mg | Freq: Two times a day (BID) | INTRAVENOUS | Status: DC
  Administered 2021-12-21 – 2021-12-22 (×2): 400 mg via INTRAVENOUS
  Filled 2021-12-21 (×2): qty 200

## 2021-12-21 MED ORDER — BISACODYL 10 MG RE SUPP
10.0000 mg | Freq: Once | RECTAL | Status: AC
  Administered 2021-12-21: 10 mg via RECTAL
  Filled 2021-12-21: qty 1

## 2021-12-21 MED ORDER — ONDANSETRON HCL 4 MG/2ML IJ SOLN
4.0000 mg | Freq: Four times a day (QID) | INTRAMUSCULAR | Status: DC | PRN
  Administered 2021-12-21: 4 mg via INTRAVENOUS
  Filled 2021-12-21: qty 2

## 2021-12-21 MED ORDER — METRONIDAZOLE 500 MG/100ML IV SOLN
500.0000 mg | Freq: Two times a day (BID) | INTRAVENOUS | Status: DC
  Administered 2021-12-21 – 2021-12-22 (×2): 500 mg via INTRAVENOUS
  Filled 2021-12-21 (×2): qty 100

## 2021-12-21 MED ORDER — METRONIDAZOLE 500 MG/100ML IV SOLN
500.0000 mg | Freq: Once | INTRAVENOUS | Status: AC
  Administered 2021-12-21: 500 mg via INTRAVENOUS
  Filled 2021-12-21: qty 100

## 2021-12-21 MED ORDER — POTASSIUM CHLORIDE 10 MEQ/100ML IV SOLN
10.0000 meq | INTRAVENOUS | Status: AC
  Administered 2021-12-21 (×3): 10 meq via INTRAVENOUS
  Filled 2021-12-21 (×3): qty 100

## 2021-12-21 MED ORDER — POTASSIUM CHLORIDE IN NACL 40-0.9 MEQ/L-% IV SOLN: INTRAVENOUS | Status: DC

## 2021-12-21 MED ORDER — RIVAROXABAN 20 MG PO TABS
20.0000 mg | ORAL_TABLET | Freq: Every day | ORAL | Status: DC
  Administered 2021-12-21: 20 mg via ORAL
  Filled 2021-12-21: qty 1

## 2021-12-21 MED ORDER — MAGNESIUM SULFATE 2 GM/50ML IV SOLN
2.0000 g | Freq: Once | INTRAVENOUS | Status: AC
  Administered 2021-12-21: 2 g via INTRAVENOUS
  Filled 2021-12-21: qty 50

## 2021-12-21 MED ORDER — MESALAMINE ER 250 MG PO CPCR
500.0000 mg | ORAL_CAPSULE | Freq: Four times a day (QID) | ORAL | Status: DC
  Administered 2021-12-21 – 2021-12-22 (×5): 500 mg via ORAL
  Filled 2021-12-21 (×10): qty 2

## 2021-12-21 MED ORDER — METHYLPREDNISOLONE SODIUM SUCC 125 MG IJ SOLR
INTRAMUSCULAR | Status: AC
  Administered 2021-12-21: 125 mg via INTRAVENOUS
  Filled 2021-12-21: qty 2

## 2021-12-21 MED ORDER — METHYLPREDNISOLONE SODIUM SUCC 40 MG IJ SOLR
40.0000 mg | Freq: Two times a day (BID) | INTRAMUSCULAR | Status: DC
  Administered 2021-12-21 – 2021-12-22 (×2): 40 mg via INTRAVENOUS
  Filled 2021-12-21 (×2): qty 1

## 2021-12-21 MED ORDER — MORPHINE SULFATE (PF) 2 MG/ML IV SOLN
2.0000 mg | INTRAVENOUS | Status: DC | PRN
  Administered 2021-12-21 – 2021-12-22 (×6): 2 mg via INTRAVENOUS
  Filled 2021-12-21 (×6): qty 1

## 2021-12-21 MED ORDER — MORPHINE SULFATE (PF) 4 MG/ML IV SOLN
4.0000 mg | Freq: Once | INTRAVENOUS | Status: AC
  Administered 2021-12-21: 4 mg via INTRAVENOUS
  Filled 2021-12-21: qty 1

## 2021-12-21 MED ORDER — CIPROFLOXACIN IN D5W 400 MG/200ML IV SOLN
400.0000 mg | Freq: Once | INTRAVENOUS | Status: AC
  Administered 2021-12-21: 400 mg via INTRAVENOUS
  Filled 2021-12-21: qty 200

## 2021-12-21 MED ORDER — LACTATED RINGERS IV BOLUS
2000.0000 mL | Freq: Once | INTRAVENOUS | Status: AC
  Administered 2021-12-21: 2000 mL via INTRAVENOUS

## 2021-12-21 MED ORDER — METHYLPREDNISOLONE SODIUM SUCC 125 MG IJ SOLR: 125.0000 mg | Freq: Once | INTRAMUSCULAR | Status: DC

## 2021-12-21 MED ORDER — METHYLPREDNISOLONE SODIUM SUCC 125 MG IJ SOLR: 125.0000 mg | INTRAMUSCULAR | Status: AC

## 2021-12-21 MED ORDER — ACETAMINOPHEN 650 MG RE SUPP: 650.0000 mg | Freq: Four times a day (QID) | RECTAL | Status: DC | PRN

## 2021-12-21 MED ORDER — OXYCODONE HCL 5 MG PO TABS
5.0000 mg | ORAL_TABLET | ORAL | Status: DC | PRN
  Administered 2021-12-21 – 2021-12-22 (×6): 5 mg via ORAL
  Filled 2021-12-21 (×5): qty 1

## 2021-12-21 MED ORDER — POTASSIUM CHLORIDE CRYS ER 20 MEQ PO TBCR
40.0000 meq | EXTENDED_RELEASE_TABLET | Freq: Once | ORAL | Status: AC
  Administered 2021-12-21: 40 meq via ORAL
  Filled 2021-12-21: qty 2

## 2021-12-21 MED ORDER — ACETAMINOPHEN 325 MG PO TABS
650.0000 mg | ORAL_TABLET | Freq: Four times a day (QID) | ORAL | Status: DC | PRN
  Administered 2021-12-21 (×2): 650 mg via ORAL
  Filled 2021-12-21 (×2): qty 2

## 2021-12-21 NOTE — Progress Notes (Signed)
Patient seen and examined on the bedside.  She moved her bowels this morning and there was "moderate" amount of blood in the stool.  She still has some abdominal pain.  No vomiting today.  Vital signs are stable.  Continue current management.  Follow-up with GI.

## 2021-12-21 NOTE — ED Notes (Signed)
Potassium decreased to 33m/hr

## 2021-12-21 NOTE — Consult Note (Addendum)
Referring Provider: Dr. Alford Highland Primary Care Physician:  Caryl Bis, MD Primary Gastroenterologist:  Dr. Laural Golden  Date of Admission: 12/20/21 Date of Consultation: 12/21/21  Reason for Consultation:  Crohn's colitis   HPI:  Elizabeth [name] is a 61 y.o. year old female with history of small and large bowel Crohn's disease who is status post right hemicolectomy(November 2005 at [facility]) and history of sigmoid diverticulitis status post sigmoid colon resection in January 2016 at [facility]. On Humira and Pentasa. Presented with acute abdominal pain LLQ and radiating across lower abdomen, associated chills but no fever, and vomiting. No diarrhea reported, and in fact, she had felt constipated. Afebrile. CT abd/pelvis without contrast showed mildly dilated, fluid-filled colon. Slight haziness/inflammation was adjacent to splenic flexure and descending colon. Findings possibly related to ileus or early left colonic colitis. Mild leukocytosis with WBC count 12.6. Notable hypokalemia of 2.6. Lactic acid 1.4.   States she had acute onset of abdominal pain prior to admission. Felt constipated. Took dulcolax. Last BM on Saturday morning, small amount. Associated vomiting. No diarrhea. Small amount of rectal bleeding. LLQ pain, radiates across lower abdomen. Improved since admission. Noted chills at home but no fever. Has been on Pentasa since 2005. Was off for several weeks then resumed again in November. Started Humira May 2022. Humira dose actually due today. Baseline bowel habits at least 6 soft/loose stools per day. Used to take colestipol, but this constipated her. Feels like since resuming Pentasa in November, she had mild improvement. No NSAIDs.    Colonoscopy 11/11/21 by Dr. 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 Focal area with congested  mucosa in descending colon s/p biopsy. Congested mucosa in rectum and sigmoid colon s/p biopsy. Ileal biopsy with mildly active chronic enteritis. Biopsy of polypoid mucosa descending colon reveals hyperplastic and serrated morphology.Biopsy from distal sigmoid colon reveals mildly active colitis. She was asked to go back on Pentasa. 1 year surveillance colonoscopy recommended.   Past Medical History:  Diagnosis Date   Bronchitis    C. difficile diarrhea    Cough 01/28/2014   upper airway cough syndrome   Crohn's disease ([place])    Diverticulitis    DVT 06/2020   history of   Elevated transaminase level    GERD (gastroesophageal reflux disease)    Hypertension    Obesity    Pneumonia 12/2013   Tobacco abuse     Past Surgical History:  Procedure Laterality Date   ABDOMINAL HYSTERECTOMY  12/13/1978   APPENDECTOMY     BIOPSY  11/11/2021   Procedure: BIOPSY;  Surgeon: Rogene Houston, MD;  [facility];  Service: Endoscopy;;  bx of polyp and small bowel   COLON SURGERY     COLONOSCOPY     COLONOSCOPY N/A 08/16/2014   Procedure: COLONOSCOPY;  Surgeon: Rogene Houston, MD;  [facility];  Service: Endoscopy;  Laterality: N/A;  1200-rescheduled to 9/4 @ 10:45 Ann notified pt   COLONOSCOPY WITH PROPOFOL N/A 11/11/2021   Procedure: COLONOSCOPY WITH PROPOFOL;  Surgeon: Rogene Houston, MD;  [facility];  Service: Endoscopy;  Laterality: N/A;  2:05   ESOPHAGOGASTRODUODENOSCOPY  06/16/2012   Procedure: ESOPHAGOGASTRODUODENOSCOPY (EGD);  Surgeon: Rogene Houston, MD;  [facility];  Service: Endoscopy;  Laterality: N/A;  10:30 AM   hemocolectomy  12/14/2003   right   [place]  Resection  2005   INCISIONAL HERNIA REPAIR N/A 08/24/2021   Procedure: HERNIA REPAIR INCISIONAL WITH MESH;  Surgeon: Aviva Signs, MD;  Location: [facility];  Service: General;  Laterality: N/A;   LAPAROSCOPIC SIGMOID COLECTOMY N/A 01/07/2015   Procedure: LOW ANTERIOR COLON  RESECTION;  Surgeon: Fanny Skates, MD;  Location: [place];  Service: General;  Laterality: N/A;   SHOULDER ARTHROSCOPY WITH LABRAL REPAIR Right 01/20/2016   Procedure: SHOULDER ARTHROSCOPY WITH LABRAL REPAIR;  Surgeon: Meredith Pel, MD;  Location: [place];  Service: Orthopedics;  Laterality: Right;    Prior to Admission medications   Medication Sig Start Date End Date Taking? Authorizing Provider  Adalimumab (HUMIRA) 40 MG/0.4ML PSKT Inject 40 mg into the skin every 14 (fourteen) days. Patient will start the maintenance Tuesday February 22,2022 07/14/21  Yes Rehman, Mechele Dawley, MD  albuterol (VENTOLIN HFA) 108 (90 Base) MCG/ACT inhaler Inhale 2 puffs into the lungs every 6 (six) hours as needed for wheezing or shortness of breath. 11/09/19  Yes [provider]  fluticasone (FLONASE) 50 MCG/ACT nasal spray Place 2 sprays into both nostrils daily as needed for allergies or rhinitis.   Yes [provider]  furosemide (LASIX) 40 MG tablet Take 40 mg by mouth daily as needed for edema.   Yes [provider]  mesalamine (PENTASA) 500 MG CR capsule Take 1 capsule (500 mg total) by mouth 4 (four) times daily. 11/13/21  Yes Rehman, Mechele Dawley, MD  omeprazole (PRILOSEC) 40 MG capsule Take 40 mg by mouth daily before breakfast.   Yes [provider]  ondansetron (ZOFRAN-ODT) 8 MG disintegrating tablet TAKE 1 TABLET (8 MG TOTAL) BY MOUTH 2 (TWO) TIMES DAILY AS NEEDED. 11/23/21  Yes Carlan, Chelsea L, NP  potassium chloride (MICRO-K) 10 MEQ CR capsule Take 10 mEq by mouth 2 (two) times daily.    Yes [provider]  promethazine (PHENERGAN) 25 MG tablet Take one bid prn Patient taking differently: Take 25 mg by mouth every 6 (six) hours as needed for nausea or vomiting. Take one bid prn 09/23/21  Yes Rehman, Mechele Dawley, MD  rivaroxaban (XARELTO) 20 MG TABS tablet Take 1 tablet (20 mg total) by mouth daily with supper. 11/12/21  Yes Rehman, Mechele Dawley, MD   valsartan-hydrochlorothiazide (DIOVAN-HCT) 160-12.5 MG per tablet Take 1 tablet by mouth daily.    Yes [provider]  Cholecalciferol (VITAMIN D3) 50 MCG (2000 UT) TABS TAKE 1 TABLET BY MOUTH EVERY DAY Patient not taking: Reported on 11/02/2021 04/28/21   Rogene Houston, MD  colestipol (COLESTID) 1 g tablet Start with 2 tablets once daily for one week, then may increase to 2 tabs bid if needed. Do not take within 2 hours of other meds. Patient not taking: Reported on 12/21/2021 09/23/21   Rogene Houston, MD  dicyclomine (BENTYL) 10 MG capsule Take 1 capsule (10 mg total) by mouth 3 (three) times daily as needed for spasms. Patient not taking: Reported on 11/02/2021 01/27/21   Rogene Houston, MD    Current Facility-Administered Medications  Medication Dose Route Frequency Provider Last Rate Last Admin   0.9 % NaCl with KCl 40 mEq / L  infusion   Intravenous Continuous Jennye Boroughs, MD       acetaminophen (TYLENOL) tablet 650 mg  650 mg Oral Q6H PRN Zierle-Ghosh, Asia B, DO       Or   acetaminophen (TYLENOL) suppository 650 mg  650 mg Rectal Q6H PRN Zierle-Ghosh, Asia B, DO  Chlorhexidine Gluconate Cloth 2 % PADS 6 each  6 each Topical Daily Zierle-Ghosh, Asia B, DO   6 each at 12/21/21 0948   ciprofloxacin (CIPRO) IVPB 400 mg  400 mg Intravenous Q12H Zierle-Ghosh, Asia B, DO       mesalamine (PENTASA) CR capsule 500 mg  500 mg Oral QID Zierle-Ghosh, Asia B, DO   500 mg at 12/21/21 0948   [START ON 12/22/2021] methylPREDNISolone sodium succinate (SOLU-MEDROL) 40 mg/mL injection 40 mg  40 mg Intravenous Q12H Zierle-Ghosh, Asia B, DO       metroNIDAZOLE (FLAGYL) IVPB 500 mg  500 mg Intravenous Q12H Zierle-Ghosh, Asia B, DO       morphine 2 MG/ML injection 2 mg  2 mg Intravenous Q2H PRN Zierle-Ghosh, Asia B, DO       ondansetron (ZOFRAN) tablet 4 mg  4 mg Oral Q6H PRN Zierle-Ghosh, Asia B, DO   4 mg at 12/21/21 0759   Or   ondansetron (ZOFRAN) injection 4 mg  4 mg Intravenous  Q6H PRN Zierle-Ghosh, Asia B, DO       oxyCODONE (Oxy IR/ROXICODONE) immediate release tablet 5 mg  5 mg Oral Q4H PRN Zierle-Ghosh, Asia B, DO   5 mg at 12/21/21 0759   pantoprazole (PROTONIX) EC tablet 40 mg  40 mg Oral Daily Zierle-Ghosh, Asia B, DO   40 mg at 12/21/21 0948   potassium chloride 10 mEq in 100 mL IVPB  10 mEq Intravenous Q1 Hr x 3 Delora Fuel, MD 237 mL/hr at 12/21/21 0939 10 mEq at 12/21/21 6283   rivaroxaban (XARELTO) tablet 20 mg  20 mg Oral Q supper Zierle-Ghosh, Asia B, DO        Allergies as of 12/20/2021 - Review Complete 12/20/2021  Allergen Reaction Noted   Contrast media [iodinated contrast media] Hives and Rash 01/10/2012   Penicillins Hives and Rash 01/10/2012    Family History  Problem Relation Age of Onset   Healthy Mother    Lung cancer Father        smoked   Allergic Disorder Brother    Healthy Daughter     Social History   Socioeconomic History   Marital status: Married    Spouse name: Not on file   Number of children: Not on file   Years of education: Not on file   Highest education level: Not on file  Occupational History   Occupation: Wound Treatment Nurse    Employer: [employer]  Tobacco Use   Smoking status: Every Day    Packs/day: 0.50    Years: 40.00    Pack years: 20.00    Types: Cigarettes   Smokeless tobacco: Never   Tobacco comments:    increase smoking , very nervous  Vaping Use   Vaping Use: Never used  Substance and Sexual Activity   Alcohol use: Not Currently    Alcohol/week: 0.0 standard drinks    Comment: occ   Drug use: No   Sexual activity: Yes  Other Topics Concern   Not on file  Social History Narrative   Not on file   Social Determinants of Health   Financial Resource Strain: Not on file  Food Insecurity: Not on file  Transportation Needs: Not on file  Physical Activity: Not on file  Stress: Not on file  Social Connections: Not on file  Intimate Partner Violence: Not on file    Review of  Systems: Gen: see HPI CV: Denies chest pain, heart palpitations, syncope, edema  Resp:  Denies shortness of breath with rest, cough, wheezing GI: see HPI GU : Denies urinary burning, urinary frequency, urinary incontinence.  MS: Denies joint pain,swelling, cramping Derm: Denies rash, itching, dry skin Psych: Denies depression, anxiety,confusion, or memory loss Heme: Denies bruising, bleeding, and enlarged lymph nodes.  Physical Exam: Vital signs in last 24 hours: Temp:  [97.8 F (36.6 C)-97.9 F (36.6 C)] 97.8 F (36.6 C) (01/09 0819) Pulse Rate:  [57-100] 59 (01/09 0800) Resp:  [18-31] 19 (01/09 0800) BP: (71-157)/(46-128) 108/56 (01/09 0800) SpO2:  [92 %-100 %] 94 % (01/09 0800) Weight:  [98.4 kg] 98.4 kg (01/08 2336)   General:   Alert,  Well-developed, well-nourished, pleasant and cooperative in NAD Head:  Normocephalic and atraumatic. Eyes:  Sclera clear, no icterus.   Conjunctiva pink. Ears:  Normal auditory acuity. Nose:  No deformity, discharge,  or lesions. Lungs:  Clear throughout to auscultation.    Heart:  S1 S2 present without murmurs  Abdomen:  Soft, nontender and nondistended. No masses, hepatosplenomegaly or hernias noted. Normal bowel sounds, without guarding, and without rebound.   Rectal:  Deferred  Msk:  Symmetrical without gross deformities. Normal posture. Extremities:  Without edema. Neurologic:  Alert and  oriented x4 Psych:  Alert and cooperative. Normal mood and affect.  Intake/Output from previous day: No intake/output data recorded. Intake/Output this shift: Total I/O In: 89.1 [IV Piggyback:89.1] Out: -   Lab Results: Recent Labs    12/21/21 0035  WBC 12.6*  HGB 15.1*  HCT 41.7  PLT 292   BMET Recent Labs    12/21/21 0035  NA 137  K 2.6*  CL 105  CO2 22  GLUCOSE 133*  BUN 23  CREATININE 0.80  CALCIUM 7.8*   LFT Recent Labs    12/21/21 0035  PROT 6.1*  ALBUMIN 3.2*  AST 21  ALT 15  ALKPHOS 58  BILITOT 1.3*       Studies/Results: CT ABDOMEN PELVIS WO CONTRAST  Result Date: 12/21/2021 CLINICAL DATA:  Crohn's exacerbation EXAM: CT ABDOMEN AND PELVIS WITHOUT CONTRAST TECHNIQUE: Multidetector CT imaging of the abdomen and pelvis was performed following the standard protocol without IV contrast. COMPARISON:  06/05/2020 FINDINGS: Lower chest: Dependent atelectasis in the lower lobes. No effusions. Hepatobiliary: No focal hepatic abnormality. Gallbladder unremarkable. Pancreas: No focal abnormality or ductal dilatation. Spleen: No focal abnormality.  Normal size. Adrenals/Urinary Tract: No adrenal abnormality. No focal renal abnormality. No stones or hydronephrosis. Urinary bladder is unremarkable. Stomach/Bowel: Postoperative changes in the sigmoid colon and cecum. Colon is moderately distended and fluid-filled. Mild haziness adjacent to the splenic flexure and descending colon. Stomach and small bowel decompressed, grossly unremarkable. Vascular/Lymphatic: Aortic atherosclerosis. No evidence of aneurysm or adenopathy. Reproductive: Prior hysterectomy.  No adnexal masses. Other: No free fluid or free air. Musculoskeletal: No acute bony abnormality. IMPRESSION: Mildly dilated, fluid-filled colon. Slight haziness/inflammation adjacent to the splenic flexure and descending colon. Findings could reflect ileus or possible early left colonic colitis. No evidence of bowel obstruction. Aortic atherosclerosis. Dependent bibasilar atelectasis. Electronically Signed   By: Rolm Baptise M.D.   On: 12/21/2021 02:42    Impression: 61 y.o. year old female with history of small and large bowel Crohn's disease who is status post right hemicolectomy(November 2005 at [facility]) and history of sigmoid diverticulitis status post sigmoid colon resection in January 2016 at [facility], on Humira and Pentasa, presenting  with acute abdominal pain LLQ and radiating across lower abdomen, associated chills but no fever, and vomiting. CT with  question of left colonic colitis, unable to exclude ileus. She has had no diarrhea and in fact was moreso constipated when presenting to the ED.   Abdominal pain has improved, with benign abdominal exam. Prior to admission, her baseline bowel habits were 6 loose/soft stools per day. 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 Focal area with congested mucosa in descending colon s/p biopsy. Congested mucosa in rectum and sigmoid colon s/p biopsy. Ileal biopsy with mildly active chronic enteritis. Biopsy of polypoid mucosa descending colon reveals hyperplastic and serrated morphology.Biopsy from distal sigmoid colon reveals mildly active colitis. 1 year surveillance colonoscopy recommended.   Will need stool studies if any diarrhea. She is actually due for Humira today, so we will take this opportunity to check Humira trough and antibody levels. Therapy may need to be escalated. I do note where she received SoluMedrol this morning; continue IV steroids as already outlined in [facility].   Hypokalemia: per hospitalist  Plan: Stool studies if diarrhea Check Humira trough and antibody level today CRP, sed rate May start clear liquids Continue Pentasa Continue IV steroids PPI daily Continue empiric antibiotics Continue with supportive measures   Annitta Needs, PhD, ANP-BC [facility]      LOS: 0 days    12/21/2021, 9:52 AM

## 2021-12-21 NOTE — ED Notes (Signed)
Has not had any of her PO contrast.

## 2021-12-21 NOTE — ED Notes (Signed)
No emesis since before zofran admin.

## 2021-12-21 NOTE — ED Notes (Signed)
Pt is no longer diaphoretic.

## 2021-12-21 NOTE — TOC Progression Note (Signed)
°  Transition of Care (TOC) Screening Note   Patient Details  Name: AMELIAROSE SHARK Date of Birth: [birth year]   Transition of Care [facility]) CM/SW Contact:    Shade Flood, LCSW Phone Number: 12/21/2021, 9:44 AM    Transition of Care Department [facility]) has reviewed patient and no TOC needs have been identified at this time. We will continue to monitor patient advancement through interdisciplinary progression rounds. If new patient transition needs arise, please place a TOC consult.

## 2021-12-21 NOTE — ED Notes (Signed)
Pt requesting something to drink. Informed she is too unstable at this time.

## 2021-12-21 NOTE — ED Notes (Signed)
EDP notified of BP. Verbal order for 1l LR started.

## 2021-12-21 NOTE — ED Notes (Signed)
Lab in room.

## 2021-12-21 NOTE — ED Notes (Signed)
Pt diaphoretic. Edp to come to bedside.

## 2021-12-21 NOTE — ED Notes (Signed)
Only took 1 potassium tab. Says they make her sick.

## 2021-12-21 NOTE — Telephone Encounter (Signed)
Seen 10/06/21. Has upcoming appt 4/4

## 2021-12-21 NOTE — H&P (Signed)
TRH H&P    Patient Demographics:    Elizabeth [name], is a 61 y.o. female  MRN: [id number]  DOB - Jun 06, 1961  Admit Date - 12/20/2021  Referring MD/NP/PA: Roxanne Mins  Outpatient Primary MD for the patient is Caryl Bis, MD  Patient coming from: Home  Chief complaint- Abdominal pain   HPI:    Anaston Koehn  is a 61 y.o. female, with history of C. difficile, Crohn's disease, diverticulitis, DVT on Xarelto, GERD, hypertension, tobacco use disorder, obesity, and more presents ED with a chief complaint of abdominal pain.  Patient reports that the abdominal pain started the day prior to presentation.  She had been constipated, so she took a Dulcolax.  She then started throwing up.  She was straining very hard to attempt to have a bowel movement and started noticing some drops of bright red blood in the toilet bowl.  She reports that happened about 4 PM on Sunday.  The bleeding was not associated with worsening pain.  She describes her pain in her abdomen as in her left lower quadrant and as sharp and crampy.  It was severe upon arrival.  Nothing triggered it.  Morphine made it go away.  Patient reports about 20 episodes of emesis.  She had not had a meal all day on the day of presentation.  Her last meal had been 5 PM on the day prior to presentation.  Her emesis was nonbloody, green liquid.  Patient reports no diarrhea.  Her last Crohn's flare was approximately 1 year ago.  That is when she was started on Humira.  Patient reports that she is due for Humira today.  Her last mesalamine was yesterday.  She takes it daily.  Patient has had no fevers.  She has no other complaints at this time.  Patient is a current smoker but declines nicotine patch at this time.  She does not drink alcohol, does not use illicit drugs, is not vaccinated for COVID.  Patient is full code.  In the ED Temp 97.9, heart rate 57-100, respiratory rate 19-31,  blood pressure as low as 71/46 and this is the reason for the stepdown admission, and trending up after fluids to 94/67 Patient is a leukocytosis 12.6, hemoglobin 15.1, platelets 292 Patient has a hypokalemia 2.6, magnesium 1.2 CT abdomen shows left colonic colitis without bowel obstruction EKG shows a heart rate 72, sinus rhythm, QTC 494 without ischemic changes Patient was given mag sulfate 2 g, Solu-Medrol 125 mg, Flagyl, Cipro, 2 L LR bolus, morphine, Zofran, 40 mEq of potassium Admission requested for Crohn's flare    Review of systems:    In addition to the HPI above,  No Fever-chills, No Headache, No changes with Vision or hearing, No problems swallowing food or Liquids, No Chest pain, Cough or Shortness of Breath, No Blood in stool or Urine, No dysuria, No new skin rashes or bruises, No new joints pains-aches,  No new weakness, tingling, numbness in any extremity, No recent weight gain or loss, No polyuria, polydypsia or polyphagia, No significant Mental  Stressors.  All other systems reviewed and are negative.    Past History of the following :    Past Medical History:  Diagnosis Date   Bronchitis    C. difficile diarrhea    Cough 01/28/2014   upper airway cough syndrome   Crohn's disease ([place])    Diverticulitis    DVT 06/2020   history of   Elevated transaminase level    GERD (gastroesophageal reflux disease)    Hypertension    Obesity    Pneumonia 12/2013   Tobacco abuse       Past Surgical History:  Procedure Laterality Date   ABDOMINAL HYSTERECTOMY  12/13/1978   APPENDECTOMY     BIOPSY  11/11/2021   Procedure: BIOPSY;  Surgeon: Rogene Houston, MD;  [facility];  Service: Endoscopy;;  bx of polyp and small bowel   COLON SURGERY     COLONOSCOPY     COLONOSCOPY N/A 08/16/2014   Procedure: COLONOSCOPY;  Surgeon: Rogene Houston, MD;  [facility];  Service: Endoscopy;  Laterality: N/A;  1200-rescheduled to 9/4 @ 10:45 Ann  notified pt   COLONOSCOPY WITH PROPOFOL N/A 11/11/2021   Procedure: COLONOSCOPY WITH PROPOFOL;  Surgeon: Rogene Houston, MD;  [facility];  Service: Endoscopy;  Laterality: N/A;  2:05   ESOPHAGOGASTRODUODENOSCOPY  06/16/2012   Procedure: ESOPHAGOGASTRODUODENOSCOPY (EGD);  Surgeon: Rogene Houston, MD;  [facility];  Service: Endoscopy;  Laterality: N/A;  10:30 AM   hemocolectomy  12/14/2003   right   Illeocecal Resection  2005   INCISIONAL HERNIA REPAIR N/A 08/24/2021   Procedure: HERNIA REPAIR INCISIONAL WITH MESH;  Surgeon: Aviva Signs, MD;  Location: [facility];  Service: General;  Laterality: N/A;   LAPAROSCOPIC SIGMOID COLECTOMY N/A 01/07/2015   Procedure: LOW ANTERIOR COLON RESECTION;  Surgeon: Fanny Skates, MD;  Location: [facility];  Service: General;  Laterality: N/A;   SHOULDER ARTHROSCOPY WITH LABRAL REPAIR Right 01/20/2016   Procedure: SHOULDER ARTHROSCOPY WITH LABRAL REPAIR;  Surgeon: Meredith Pel, MD;  Location: [facility];  Service: Orthopedics;  Laterality: Right;      Social History:      Social History   Tobacco Use   Smoking status: Every Day    Packs/day: 0.50    Years: 40.00    Pack years: 20.00    Types: Cigarettes   Smokeless tobacco: Never   Tobacco comments:    increase smoking , very nervous  Substance Use Topics   Alcohol use: Not Currently    Alcohol/week: 0.0 standard drinks    Comment: occ       Family History :     Family History  Problem Relation Age of Onset   Healthy Mother    Lung cancer Father        smoked   Allergic Disorder Brother    Healthy Daughter       Home Medications:   Prior to Admission medications   Medication Sig Start Date End Date Taking? Authorizing Provider  Adalimumab (HUMIRA) 40 MG/0.4ML PSKT Inject 40 mg into the skin every 14 (fourteen) days. Patient will start the maintenance Tuesday February 22,2022 07/14/21   Rogene Houston, MD  albuterol (VENTOLIN HFA) 108 (90 Base) MCG/ACT  inhaler Inhale 2 puffs into the lungs every 6 (six) hours as needed for wheezing or shortness of breath. 11/09/19   [provider]  Cholecalciferol (VITAMIN D3) 50 MCG (2000 UT) TABS TAKE 1 TABLET BY MOUTH  EVERY DAY Patient not taking: Reported on 11/02/2021 04/28/21   Rogene Houston, MD  colestipol (COLESTID) 1 g tablet Start with 2 tablets once daily for one week, then may increase to 2 tabs bid if needed. Do not take within 2 hours of other meds. Patient taking differently: Take 1 g by mouth 2 (two) times daily. 09/23/21   Rehman, Mechele Dawley, MD  dicyclomine (BENTYL) 10 MG capsule Take 1 capsule (10 mg total) by mouth 3 (three) times daily as needed for spasms. Patient not taking: Reported on 11/02/2021 01/27/21   Rogene Houston, MD  fluticasone [facility]) 50 MCG/ACT nasal spray Place 2 sprays into both nostrils daily as needed for allergies or rhinitis.    [provider]  furosemide (LASIX) 40 MG tablet Take 40 mg by mouth daily as needed for edema.    [provider]  mesalamine (PENTASA) 500 MG CR capsule Take 1 capsule (500 mg total) by mouth 4 (four) times daily. 11/13/21   Rogene Houston, MD  omeprazole (PRILOSEC) 40 MG capsule Take 40 mg by mouth daily before breakfast.    [provider]  ondansetron (ZOFRAN-ODT) 8 MG disintegrating tablet TAKE 1 TABLET (8 MG TOTAL) BY MOUTH 2 (TWO) TIMES DAILY AS NEEDED. 11/23/21   Carlan, Chelsea L, NP  potassium chloride (MICRO-K) 10 MEQ CR capsule Take 10 mEq by mouth 2 (two) times daily.     [provider]  promethazine (PHENERGAN) 25 MG tablet Take one bid prn 09/23/21   Rogene Houston, MD  rivaroxaban (XARELTO) 20 MG TABS tablet Take 1 tablet (20 mg total) by mouth daily with supper. 11/12/21   Rehman, Mechele Dawley, MD  valsartan-hydrochlorothiazide (DIOVAN-HCT) 160-12.5 MG per tablet Take 1 tablet by mouth daily.     [provider]     Allergies:     Allergies  Allergen Reactions    Contrast Media [Iodinated Contrast Media] Hives and Rash   Penicillins Hives and Rash              Physical Exam:   Vitals  Blood pressure 114/67, pulse 63, temperature 97.9 F (36.6 C), resp. rate (!) 22, height 5' 7"  (1.702 m), weight 98.4 kg, SpO2 100 %.   1.  General: Patient lying supine in bed,  no acute distress   2. Psychiatric: Alert and oriented x 3, mood and behavior normal for situation, pleasant and cooperative with exam   3. Neurologic: Speech and language are normal, face is symmetric, moves all 4 extremities voluntarily, at baseline without acute deficits on limited exam   4. HEENMT:  Head is atraumatic, normocephalic, pupils reactive to light, neck is supple, trachea is midline, mucous membranes are moist   5. Respiratory : Lungs are clear to auscultation bilaterally without wheezing, rhonchi, rales, no cyanosis, no increase in work of breathing or accessory muscle use   6. Cardiovascular : Heart rate normal, rhythm is regular, no murmurs, rubs or gallops, no peripheral edema, peripheral pulses palpated   7. Gastrointestinal:  Abdomen is soft, nondistended, diffuse tenderness but worse in the left lower quadrant, no masses or organomegaly palpated   8. Skin:  Skin is warm, dry and intact without rashes, acute lesions, or ulcers on limited exam   9.Musculoskeletal:  No acute deformities or trauma, no asymmetry in tone, no peripheral edema, peripheral pulses palpated, no tenderness to palpation in the extremities     Data Review:    CBC Recent Labs  Lab 12/21/21 0035  WBC 12.6*  HGB 15.1*  HCT 41.7  PLT 292  MCV 106.1*  MCH 38.4*  MCHC 36.2*  RDW 14.3  LYMPHSABS 1.1  MONOABS 0.4  EOSABS 0.0  BASOSABS 0.0   ------------------------------------------------------------------------------------------------------------------  Results for orders placed or performed during the hospital encounter of 12/20/21 (from the past 48 hour(s))   Comprehensive metabolic panel     Status: Abnormal   Collection Time: 12/21/21 12:35 AM  Result Value Ref Range   Sodium 137 135 - 145 mmol/L   Potassium 2.6 (LL) 3.5 - 5.1 mmol/L    Comment: CRITICAL RESULT CALLED TO, READ BACK BY AND VERIFIED WITH: NICKOLS,K @ 0123 ON 12/21/21 BY JUW    Chloride 105 98 - 111 mmol/L   CO2 22 22 - 32 mmol/L   Glucose, Bld 133 (H) 70 - 99 mg/dL    Comment: Glucose reference range applies only to samples taken after fasting for at least 8 hours.   BUN 23 8 - 23 mg/dL   Creatinine, Ser 0.80 0.44 - 1.00 mg/dL   Calcium 7.8 (L) 8.9 - 10.3 mg/dL   Total Protein 6.1 (L) 6.5 - 8.1 g/dL   Albumin 3.2 (L) 3.5 - 5.0 g/dL   AST 21 15 - 41 U/L   ALT 15 0 - 44 U/L   Alkaline Phosphatase 58 38 - 126 U/L   Total Bilirubin 1.3 (H) 0.3 - 1.2 mg/dL   GFR, Estimated >60 >60 mL/min    Comment: (NOTE) Calculated using the CKD-EPI Creatinine Equation (2021)    Anion gap 10 5 - 15    Comment: Performed at [facility], [address], [place], [place] [address]  Lactic acid, plasma     Status: None   Collection Time: 12/21/21 12:35 AM  Result Value Ref Range   Lactic Acid, Venous 1.4 0.5 - 1.9 mmol/L    Comment: Performed at [facility], [address], [place], [place] [address]  CBC with Differential     Status: Abnormal   Collection Time: 12/21/21 12:35 AM  Result Value Ref Range   WBC 12.6 (H) 4.0 - 10.5 K/uL   RBC 3.93 3.87 - 5.11 MIL/uL   Hemoglobin 15.1 (H) 12.0 - 15.0 g/dL   HCT 41.7 36.0 - 46.0 %   MCV 106.1 (H) 80.0 - 100.0 fL   MCH 38.4 (H) 26.0 - 34.0 pg   MCHC 36.2 (H) 30.0 - 36.0 g/dL   RDW 14.3 11.5 - 15.5 %   Platelets 292 150 - 400 K/uL   nRBC 0.0 0.0 - 0.2 %   Neutrophils Relative % 86 %   Neutro Abs 11.0 (H) 1.7 - 7.7 K/uL   Lymphocytes Relative 9 %   Lymphs Abs 1.1 0.7 - 4.0 K/uL   Monocytes Relative 4 %   Monocytes Absolute 0.4 0.1 - 1.0 K/uL   Eosinophils Relative 0 %   Eosinophils Absolute 0.0 0.0 - 0.5 K/uL   Basophils Relative 0 %    Basophils Absolute 0.0 0.0 - 0.1 K/uL   Immature Granulocytes 1 %   Abs Immature Granulocytes 0.10 (H) 0.00 - 0.07 K/uL    Comment: Performed at [facility], [address], [place], [place] [address]  Magnesium     Status: Abnormal   Collection Time: 12/21/21 12:35 AM  Result Value Ref Range   Magnesium 1.2 (L) 1.7 - 2.4 mg/dL    Comment: Performed at [facility], [address] [place],  [address]  Lactic acid, plasma  Status: None   Collection Time: 12/21/21  3:09 AM  Result Value Ref Range   Lactic Acid, Venous 1.2 0.5 - 1.9 mmol/L    Comment: Performed at [facility], [address], [place], [place] [address]    Chemistries  Recent Labs  Lab 12/21/21 0035  NA 137  K 2.6*  CL 105  CO2 22  GLUCOSE 133*  BUN 23  CREATININE 0.80  CALCIUM 7.8*  MG 1.2*  AST 21  ALT 15  ALKPHOS 58  BILITOT 1.3*   ------------------------------------------------------------------------------------------------------------------  ------------------------------------------------------------------------------------------------------------------ GFR: Estimated Creatinine Clearance: 87.8 mL/min (by C-G formula based on SCr of 0.8 mg/dL). Liver Function Tests: Recent Labs  Lab 12/21/21 0035  AST 21  ALT 15  ALKPHOS 58  BILITOT 1.3*  PROT 6.1*  ALBUMIN 3.2*   No results for input(s): LIPASE, AMYLASE in the last 168 hours. No results for input(s): AMMONIA in the last 168 hours. Coagulation Profile: No results for input(s): INR, PROTIME in the last 168 hours. Cardiac Enzymes: No results for input(s): CKTOTAL, CKMB, CKMBINDEX, TROPONINI in the last 168 hours. BNP (last 3 results) No results for input(s): PROBNP in the last 8760 hours. HbA1C: No results for input(s): HGBA1C in the last 72 hours. CBG: No results for input(s): GLUCAP in the last 168 hours. Lipid Profile: No results for input(s): CHOL, HDL, LDLCALC, TRIG, CHOLHDL, LDLDIRECT in the last 72 hours. Thyroid  Function Tests: No results for input(s): TSH, T4TOTAL, FREET4, T3FREE, THYROIDAB in the last 72 hours. Anemia Panel: No results for input(s): VITAMINB12, FOLATE, FERRITIN, TIBC, IRON, RETICCTPCT in the last 72 hours.  --------------------------------------------------------------------------------------------------------------- Urine analysis:    Component Value Date/Time   COLORURINE YELLOW 12/27/2014 0950   APPEARANCEUR CLEAR 12/27/2014 0950   LABSPEC 1.019 12/27/2014 0950   PHURINE 5.0 12/27/2014 0950   GLUCOSEU NEGATIVE 12/27/2014 0950   HGBUR MODERATE (A) 12/27/2014 0950   BILIRUBINUR NEGATIVE 12/27/2014 0950   KETONESUR NEGATIVE 12/27/2014 0950   PROTEINUR NEGATIVE 12/27/2014 0950   UROBILINOGEN 0.2 12/27/2014 0950   NITRITE NEGATIVE 12/27/2014 0950   LEUKOCYTESUR NEGATIVE 12/27/2014 0950      Imaging Results:    CT ABDOMEN PELVIS WO CONTRAST  Result Date: 12/21/2021 CLINICAL DATA:  Crohn's exacerbation EXAM: CT ABDOMEN AND PELVIS WITHOUT CONTRAST TECHNIQUE: Multidetector CT imaging of the abdomen and pelvis was performed following the standard protocol without IV contrast. COMPARISON:  06/05/2020 FINDINGS: Lower chest: Dependent atelectasis in the lower lobes. No effusions. Hepatobiliary: No focal hepatic abnormality. Gallbladder unremarkable. Pancreas: No focal abnormality or ductal dilatation. Spleen: No focal abnormality.  Normal size. Adrenals/Urinary Tract: No adrenal abnormality. No focal renal abnormality. No stones or hydronephrosis. Urinary bladder is unremarkable. Stomach/Bowel: Postoperative changes in the sigmoid colon and cecum. Colon is moderately distended and fluid-filled. Mild haziness adjacent to the splenic flexure and descending colon. Stomach and small bowel decompressed, grossly unremarkable. Vascular/Lymphatic: Aortic atherosclerosis. No evidence of aneurysm or adenopathy. Reproductive: Prior hysterectomy.  No adnexal masses. Other: No free fluid or free air.  Musculoskeletal: No acute bony abnormality. IMPRESSION: Mildly dilated, fluid-filled colon. Slight haziness/inflammation adjacent to the splenic flexure and descending colon. Findings could reflect ileus or possible early left colonic colitis. No evidence of bowel obstruction. Aortic atherosclerosis. Dependent bibasilar atelectasis. Electronically Signed   By: Rolm Baptise M.D.   On: 12/21/2021 02:42      Assessment & Plan:    Principal Problem:   Crohn's colitis ([place]) Active Problems:   GERD (gastroesophageal reflux disease)   HTN (hypertension)  Hypokalemia   Tobacco abuse   Hypomagnesemia   Crohn's colitis CT shows colitis, leukocytosis 12.6, history of Crohn's disease Continue mesalamine Consult GI Continue Solu-Medrol N.p.o. except for sips and chips Pain scale for pain control Continue to monitor GERD Continue PPI Hypertension Holding Diovan in the setting of hypotension in the ED Admit to stepdown Continue fluids for hypotension Hypokalemia Replace and recheck Secondary to decreased p.o. intake and GI losses with vomiting Hypomagnesemia Replace and recheck Secondary to decreased p.o. intake and GI losses with vomiting Tobacco use disorder Declining nicotine patch at this time Blood clots History of DVT Continue Xarelto    DVT Prophylaxis-   Xarelto- SCDs   AM Labs Ordered, also please review Full Orders  Family Communication: No family at bedside  Code Status: Full  Admission status: Inpatient :The appropriate admission status for this patient is INPATIENT. Inpatient status is judged to be reasonable and necessary in order to provide the required intensity of service to ensure the patient's safety. The patient's presenting symptoms, physical exam findings, and initial radiographic and laboratory data in the context of their chronic comorbidities is felt to place them at high risk for further clinical deterioration. Furthermore, it is not anticipated that the  patient will be medically stable for discharge from the hospital within 2 midnights of admission. The following factors support the admission status of inpatient.     The patient's presenting symptoms include abdominal pain. The worrisome physical exam findings include abdominal tenderness. The initial radiographic and laboratory data are worrisome because of colitis and leukocytosis. The chronic co-morbidities include hypertension, tobacco use disorder, Crohn's disease.       * I certify that at the point of admission it is my clinical judgment that the patient will require inpatient hospital care spanning beyond 2 midnights from the point of admission due to high intensity of service, high risk for further deterioration and high frequency of surveillance required.*  Disposition: Anticipated Discharge date 48 hours discharge to home  Time spent in minutes : [place] DO

## 2021-12-21 NOTE — ED Notes (Signed)
Pt requesting pain medication. Informed that it is un safe to do so.

## 2021-12-21 NOTE — ED Notes (Signed)
Gave report to Elizabeth [name]; ok to go up after shift change.

## 2021-12-21 NOTE — Telephone Encounter (Signed)
Seen 10/06/21 . Has upcoming appt 4/4

## 2021-12-21 NOTE — ED Notes (Signed)
Critical value-- Potassium 2.6 reported to edp [place]lick at 3159 No new orders at this time.

## 2021-12-21 NOTE — ED Notes (Signed)
Sats 88% on room air.  pt placed on 2l [place]. Now 95%.

## 2021-12-22 ENCOUNTER — Telehealth: Payer: Self-pay | Admitting: Gastroenterology

## 2021-12-22 DIAGNOSIS — K529 Noninfective gastroenteritis and colitis, unspecified: Secondary | ICD-10-CM

## 2021-12-22 LAB — CBC WITH DIFFERENTIAL/PLATELET
Abs Immature Granulocytes: 0.1 10*3/uL — ABNORMAL HIGH (ref 0.00–0.07)
Basophils Absolute: 0 10*3/uL (ref 0.0–0.1)
Basophils Relative: 0 %
Eosinophils Absolute: 0 10*3/uL (ref 0.0–0.5)
Eosinophils Relative: 0 %
HCT: 36.5 % (ref 36.0–46.0)
Hemoglobin: 13.2 g/dL (ref 12.0–15.0)
Immature Granulocytes: 1 %
Lymphocytes Relative: 4 %
Lymphs Abs: 0.6 10*3/uL — ABNORMAL LOW (ref 0.7–4.0)
MCH: 39.8 pg — ABNORMAL HIGH (ref 26.0–34.0)
MCHC: 36.2 g/dL — ABNORMAL HIGH (ref 30.0–36.0)
MCV: 109.9 fL — ABNORMAL HIGH (ref 80.0–100.0)
Monocytes Absolute: 0.2 10*3/uL (ref 0.1–1.0)
Monocytes Relative: 1 %
Neutro Abs: 15.1 10*3/uL — ABNORMAL HIGH (ref 1.7–7.7)
Neutrophils Relative %: 94 %
Platelets: 258 10*3/uL (ref 150–400)
RBC: 3.32 MIL/uL — ABNORMAL LOW (ref 3.87–5.11)
RDW: 14.6 % (ref 11.5–15.5)
WBC: 16 10*3/uL — ABNORMAL HIGH (ref 4.0–10.5)
nRBC: 0 % (ref 0.0–0.2)

## 2021-12-22 LAB — COMPREHENSIVE METABOLIC PANEL
ALT: 14 U/L (ref 0–44)
AST: 14 U/L — ABNORMAL LOW (ref 15–41)
Albumin: 2.9 g/dL — ABNORMAL LOW (ref 3.5–5.0)
Alkaline Phosphatase: 49 U/L (ref 38–126)
Anion gap: 7 (ref 5–15)
BUN: 21 mg/dL (ref 8–23)
CO2: 22 mmol/L (ref 22–32)
Calcium: 7.7 mg/dL — ABNORMAL LOW (ref 8.9–10.3)
Chloride: 107 mmol/L (ref 98–111)
Creatinine, Ser: 0.73 mg/dL (ref 0.44–1.00)
GFR, Estimated: >60 mL/min (ref >60)
Glucose, Bld: 148 mg/dL — ABNORMAL HIGH (ref 70–99)
Potassium: 3.7 mmol/L (ref 3.5–5.1)
Sodium: 136 mmol/L (ref 135–145)
Total Bilirubin: 0.4 mg/dL (ref 0.3–1.2)
Total Protein: 5.5 g/dL — ABNORMAL LOW (ref 6.5–8.1)

## 2021-12-22 LAB — MAGNESIUM: Magnesium: 1.6 mg/dL — ABNORMAL LOW (ref 1.7–2.4)

## 2021-12-22 MED ORDER — METRONIDAZOLE 500 MG PO TABS
500.0000 mg | ORAL_TABLET | Freq: Two times a day (BID) | ORAL | 0 refills | Status: DC
Start: 2021-12-22 — End: 2021-12-27

## 2021-12-22 MED ORDER — PREDNISONE 10 MG PO TABS: ORAL_TABLET | ORAL | 0 refills | Status: DC

## 2021-12-22 MED ORDER — CIPROFLOXACIN HCL 500 MG PO TABS: 500.0000 mg | ORAL_TABLET | Freq: Two times a day (BID) | ORAL | 0 refills | Status: DC

## 2021-12-22 MED ORDER — MAGNESIUM SULFATE 2 GM/50ML IV SOLN
2.0000 g | Freq: Once | INTRAVENOUS | Status: AC
  Administered 2021-12-22: 2 g via INTRAVENOUS
  Filled 2021-12-22: qty 50

## 2021-12-22 NOTE — Progress Notes (Signed)
Subjective: Feels better and wants to go home. Had a BM yesterday without BRBPR or melena. States she did have a small amount of rectal bleeding on Sunday in the setting of constipation/straining. She has been passing gas today.  Abdominal pain has essentially resolved.  Mild intermittent gas discomfort.  Mild nausea which she relates to anxiety about wanting to go home.  Nausea/vomiting that was present prior to admission has resolved.  Tolerating clear liquids well and would like to advance her diet.   Humira was due yesterday. Hasn't had it.   Objective: Vital signs in last 24 hours: Temp:  [97.5 F (36.4 C)-98.2 F (36.8 C)] 97.9 F (36.6 C) (01/10 0749) Pulse Rate:  [52-60] 58 (01/10 0749) Resp:  [13-23] 20 (01/10 0749) BP: (95-117)/(40-68) 113/68 (01/10 0100) SpO2:  [91 %-95 %] 93 % (01/10 0749) Weight:  [101 kg] 101 kg (01/10 0654) Last BM Date: 12/21/21 General:   Alert and oriented, pleasant, NAD Head:  Normocephalic and atraumatic. Eyes:  No icterus, sclera clear. Conjuctiva pink.  Abdomen:  Bowel sounds present, soft, non-tender, non-distended. No HSM or hernias noted. No rebound or guarding. No masses appreciated  Msk:  Symmetrical without gross deformities. Normal posture. Extremities:  Without edema. Neurologic:  Alert and  oriented x4;  grossly normal neurologically. Skin:  Warm and dry, intact without significant lesions.  Psych:  Normal mood and affect.  Intake/Output from previous day: 01/09 0701 - 01/10 0700 In: 2080.6 [I.V.:1160.2; IV Piggyback:920.4] Out: 1050 [Urine:1050] Intake/Output this shift: No intake/output data recorded.  Lab Results: Recent Labs    12/21/21 0035 12/22/21 0420  WBC 12.6* 16.0*  HGB 15.1* 13.2  HCT 41.7 36.5  PLT 292 258   BMET Recent Labs    12/21/21 0035 12/21/21 1240 12/22/21 0420  NA 137  --  136  K 2.6* 4.2 3.7  CL 105  --  107  CO2 22  --  22  GLUCOSE 133*  --  148*  BUN 23  --  21  CREATININE 0.80  --   0.73  CALCIUM 7.8*  --  7.7*   LFT Recent Labs    12/21/21 0035 12/22/21 0420  PROT 6.1* 5.5*  ALBUMIN 3.2* 2.9*  AST 21 14*  ALT 15 14  ALKPHOS 58 49  BILITOT 1.3* 0.4   Studies/Results: CT ABDOMEN PELVIS WO CONTRAST  Result Date: 12/21/2021 CLINICAL DATA:  Crohn's exacerbation EXAM: CT ABDOMEN AND PELVIS WITHOUT CONTRAST TECHNIQUE: Multidetector CT imaging of the abdomen and pelvis was performed following the standard protocol without IV contrast. COMPARISON:  06/05/2020 FINDINGS: Lower chest: Dependent atelectasis in the lower lobes. No effusions. Hepatobiliary: No focal hepatic abnormality. Gallbladder unremarkable. Pancreas: No focal abnormality or ductal dilatation. Spleen: No focal abnormality.  Normal size. Adrenals/Urinary Tract: No adrenal abnormality. No focal renal abnormality. No stones or hydronephrosis. Urinary bladder is unremarkable. Stomach/Bowel: Postoperative changes in the sigmoid colon and cecum. Colon is moderately distended and fluid-filled. Mild haziness adjacent to the splenic flexure and descending colon. Stomach and small bowel decompressed, grossly unremarkable. Vascular/Lymphatic: Aortic atherosclerosis. No evidence of aneurysm or adenopathy. Reproductive: Prior hysterectomy.  No adnexal masses. Other: No free fluid or free air. Musculoskeletal: No acute bony abnormality. IMPRESSION: Mildly dilated, fluid-filled colon. Slight haziness/inflammation adjacent to the splenic flexure and descending colon. Findings could reflect ileus or possible early left colonic colitis. No evidence of bowel obstruction. Aortic atherosclerosis. Dependent bibasilar atelectasis. Electronically Signed   By: Rolm Baptise M.D.   On:  12/21/2021 02:42    Assessment: 61 y.o. year old female with history of small and large bowel Crohn's disease who is status post right hemicolectomy(November 2005 at [facility]) and history of sigmoid diverticulitis status post sigmoid colon resection in January 2016 at  [facility], on Humira and Pentasa, presenting  with acute abdominal pain LLQ and radiating across lower abdomen, associated chills but no fever, and vomiting. CT with question of left colonic colitis, unable to exclude ileus.  Sed rate within normal limits, CRP elevated at 1.7.  She has had no diarrhea and was moreso constipated when presenting to the ED. She was started on IV steroids, empiric antibiotics, and received a suppository yesterday. Outpatient Pentasa has been continued. Humira was due on 1/9 (hasn't taken), thus trough and antibody levels were drawn.  Clinically, she is feeling much improved since admission and is requesting to go home.  Abdominal pain has essentially resolved, only with mild intermittent gas discomfort.  Vomiting also resolved.  Tolerating clear liquid diet well.  Had a bowel movement yesterday without BRBPR or melena and is passing gas today. No diarrhea. WBC elevated today, likely reactive and influenced by IV steroids. No fever or chills.  Her abdominal exam is benign today.   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 Focal area with congested mucosa in descending colon s/p biopsy. Congested mucosa in rectum and sigmoid colon s/p biopsy. Ileal biopsy with mildly active chronic enteritis. Biopsy of polypoid mucosa descending colon reveals hyperplastic and serrated morphology.Biopsy from distal sigmoid colon reveals mildly active colitis. 1 year surveillance colonoscopy recommended.   Unclear if her symptoms were secondary to a colitis flare vs intermittent obstructive type symptoms from stricturing disease. Less likely infectious colitis as she hasn't been having diarrhea though would consider stool studies if she develops diarrhea.    As patient is feeling clinically  improved, we will try advancing her diet. If she tolerates this well, it would be reasonable to discharge home to complete a quick steroid taper with close outpatient follow-up.    Hypomagnesemia:  Magnesium 1.6 today. Management per hospitalist.   Hypokalemia:  Resolved.   Plan: Advance to soft diet.  OK to discharge home today if able to tolerate a soft diet without any worsening symptoms. Complete steroid taper. Recommend decreasing by 10 mg every 5 days. Continue Pentasa. Take Humira once home and continue current outpatient dosing.  Consider stool studies if she develops diarrhea.  Continue PPI daily. Follow-up on Humira trough and antibody levels.   LOS: 1 day    12/22/2021, 10:08 AM   Aliene Altes, PA-C [facility]

## 2021-12-22 NOTE — Telephone Encounter (Signed)
Please schedule patient for hospital follow-up in 2-4 weeks. Dx: abdominal pain/nausea/vomiting, abnormal CT colon, history of Crohn's disease.

## 2021-12-22 NOTE — Discharge Summary (Signed)
Physician Discharge Summary  Elizabeth [name] JSE:[id number] DOB: [birth year] DOA: 12/20/2021  PCP: Caryl Bis, MD  Admit date: 12/20/2021 Discharge date: 12/22/2021  Discharge disposition: Home   Recommendations for Outpatient Follow-Up:   Follow-up with PCP in 1 to 2 weeks. Follow-up with gastroenterologist as scheduled   Discharge Diagnosis:   Principal Problem:   Crohn's colitis ([place]) Active Problems:   GERD (gastroesophageal reflux disease)   HTN (hypertension)   Hypokalemia   Tobacco abuse   Hypomagnesemia   LLQ abdominal pain    Discharge Condition: Stable.  Diet recommendation:  Diet Order             DIET SOFT Room service appropriate? Yes; Fluid consistency: Thin  Diet effective now           Diet - low sodium heart healthy                     Code Status: Full Code     Hospital Course:   Ms. Elizabeth [name] is a 61 year old woman with medical history significant for C. difficile infection, Crohn's disease, diverticulitis, DVT on Xarelto, GERD, hypertension, tobacco use disorder, obesity.  She presented to the hospital because of vomiting, abdominal pain and constipation.  She said she had taken Dulcolax for constipation and then she started vomiting.  She is straining to have a bowel movement and she noticed bright red blood per rectum in the toilet bowl.  She was admitted to the hospital for suspected Crohn's colitis.  She was treated with empiric IV antibiotics, IV fluids and IV steroids.  She also continued with Pentasa from home.  She had hypokalemia and hypomagnesemia that were repleted.  Her condition has improved and she was able to tolerate her diet.  She had known bloody bowel movement in the hospital after she was given a laxative.  She  is deemed stable for discharge to home today.  Plan of care was discussed with gastroenterologist, Dr. Jenetta Downer.   Medical Consultants:   Gastroenterologist   Discharge Exam:    Vitals:   12/22/21  0400 12/22/21 0654 12/22/21 0749 12/22/21 1124  BP:      Pulse:   (!) 58 (!) 56  Resp:   20 (!) 22  Temp: 98 F (36.7 C)  97.9 F (36.6 C) 97.8 F (36.6 C)  TempSrc:   Oral Oral  SpO2:   93% 92%  Weight:  101 kg    Height:         GEN: NAD SKIN: Warm and dry EYES: No pallor or icterus ENT: MMM CV: RRR PULM: CTA B ABD: soft, obese, NT, +BS CNS: AAO x 3, non focal EXT: No edema or tenderness   The results of significant diagnostics from this hospitalization (including imaging, microbiology, ancillary and laboratory) are listed below for reference.     Procedures and Diagnostic Studies:   CT ABDOMEN PELVIS WO CONTRAST  Result Date: 12/21/2021 CLINICAL DATA:  Crohn's exacerbation EXAM: CT ABDOMEN AND PELVIS WITHOUT CONTRAST TECHNIQUE: Multidetector CT imaging of the abdomen and pelvis was performed following the standard protocol without IV contrast. COMPARISON:  06/05/2020 FINDINGS: Lower chest: Dependent atelectasis in the lower lobes. No effusions. Hepatobiliary: No focal hepatic abnormality. Gallbladder unremarkable. Pancreas: No focal abnormality or ductal dilatation. Spleen: No focal abnormality.  Normal size. Adrenals/Urinary Tract: No adrenal abnormality. No focal renal abnormality. No stones or hydronephrosis. Urinary bladder is unremarkable. Stomach/Bowel: Postoperative changes in the sigmoid colon and cecum. Colon is  moderately distended and fluid-filled. Mild haziness adjacent to the splenic flexure and descending colon. Stomach and small bowel decompressed, grossly unremarkable. Vascular/Lymphatic: Aortic atherosclerosis. No evidence of aneurysm or adenopathy. Reproductive: Prior hysterectomy.  No adnexal masses. Other: No free fluid or free air. Musculoskeletal: No acute bony abnormality. IMPRESSION: Mildly dilated, fluid-filled colon. Slight haziness/inflammation adjacent to the splenic flexure and descending colon. Findings could reflect ileus or possible early left  colonic colitis. No evidence of bowel obstruction. Aortic atherosclerosis. Dependent bibasilar atelectasis. Electronically Signed   By: Rolm Baptise M.D.   On: 12/21/2021 02:42     Labs:   Basic Metabolic Panel: Recent Labs  Lab 12/21/21 0035 12/21/21 1240 12/22/21 0420  NA 137  --  136  K 2.6* 4.2 3.7  CL 105  --  107  CO2 22  --  22  GLUCOSE 133*  --  148*  BUN 23  --  21  CREATININE 0.80  --  0.73  CALCIUM 7.8*  --  7.7*  MG 1.2* 1.8 1.6*   GFR Estimated Creatinine Clearance: 89.1 mL/min (by C-G formula based on SCr of 0.73 mg/dL). Liver Function Tests: Recent Labs  Lab 12/21/21 0035 12/22/21 0420  AST 21 14*  ALT 15 14  ALKPHOS 58 49  BILITOT 1.3* 0.4  PROT 6.1* 5.5*  ALBUMIN 3.2* 2.9*   No results for input(s): LIPASE, AMYLASE in the last 168 hours. No results for input(s): AMMONIA in the last 168 hours. Coagulation profile No results for input(s): INR, PROTIME in the last 168 hours.  CBC: Recent Labs  Lab 12/21/21 0035 12/22/21 0420  WBC 12.6* 16.0*  NEUTROABS 11.0* 15.1*  HGB 15.1* 13.2  HCT 41.7 36.5  MCV 106.1* 109.9*  PLT 292 258   Cardiac Enzymes: No results for input(s): CKTOTAL, CKMB, CKMBINDEX, TROPONINI in the last 168 hours. BNP: Invalid input(s): POCBNP CBG: No results for input(s): GLUCAP in the last 168 hours. D-Dimer No results for input(s): DDIMER in the last 72 hours. Hgb A1c No results for input(s): HGBA1C in the last 72 hours. Lipid Profile No results for input(s): CHOL, HDL, LDLCALC, TRIG, CHOLHDL, LDLDIRECT in the last 72 hours. Thyroid function studies No results for input(s): TSH, T4TOTAL, T3FREE, THYROIDAB in the last 72 hours.  Invalid input(s): FREET3 Anemia work up No results for input(s): VITAMINB12, FOLATE, FERRITIN, TIBC, IRON, RETICCTPCT in the last 72 hours. Microbiology Recent Results (from the past 240 hour(s))  Resp Panel by RT-PCR (Flu A&B, Covid) Nasopharyngeal Swab     Status: None   Collection Time:  12/21/21  5:35 AM   Specimen: Nasopharyngeal Swab; Nasopharyngeal(NP) swabs in vial transport medium  Result Value Ref Range Status   SARS Coronavirus 2 by RT PCR NEGATIVE NEGATIVE Final    Comment: (NOTE) SARS-CoV-2 target nucleic acids are NOT DETECTED.  The SARS-CoV-2 RNA is generally detectable in upper respiratory specimens during the acute phase of infection. The lowest concentration of SARS-CoV-2 viral copies this assay can detect is 138 copies/mL. A negative result does not preclude SARS-Cov-2 infection and should not be used as the sole basis for treatment or other patient management decisions. A negative result may occur with  improper specimen collection/handling, submission of specimen other than nasopharyngeal swab, presence of viral mutation(s) within the areas targeted by this assay, and inadequate number of viral copies(<138 copies/mL). A negative result must be combined with clinical observations, patient history, and epidemiological information. The expected result is Negative.  Fact Sheet for Patients:  EntrepreneurPulse.com.au  Fact Sheet for Healthcare Providers:  IncredibleEmployment.be  This test is no t yet approved or cleared by the [place] FDA and  has been authorized for detection and/or diagnosis of SARS-CoV-2 by FDA under an Emergency Use Authorization (EUA). This EUA will remain  in effect (meaning this test can be used) for the duration of the COVID-19 declaration under Section 564(b)(1) of the Act, 21 U.S.C.section 360bbb-3(b)(1), unless the authorization is terminated  or revoked sooner.       Influenza A by PCR NEGATIVE NEGATIVE Final   Influenza B by PCR NEGATIVE NEGATIVE Final    Comment: (NOTE) The Xpert Xpress SARS-CoV-2/FLU/RSV plus assay is intended as an aid in the diagnosis of influenza from Nasopharyngeal swab specimens and should not be used as a sole basis for treatment. Nasal washings  and aspirates are unacceptable for Xpert Xpress SARS-CoV-2/FLU/RSV testing.  Fact Sheet for Patients: EntrepreneurPulse.com.au  Fact Sheet for Healthcare Providers: IncredibleEmployment.be  This test is not yet approved or cleared by the [place] FDA and has been authorized for detection and/or diagnosis of SARS-CoV-2 by FDA under an Emergency Use Authorization (EUA). This EUA will remain in effect (meaning this test can be used) for the duration of the COVID-19 declaration under Section 564(b)(1) of the Act, 21 U.S.C. section 360bbb-3(b)(1), unless the authorization is terminated or revoked.  Performed at [facility], [address] [place], [place] [address]   MRSA Next Gen by PCR, Nasal     Status: None   Collection Time: 12/21/21  8:34 AM   Specimen: Nasal Mucosa; Nasal Swab  Result Value Ref Range Status   MRSA by PCR Next Gen NOT DETECTED NOT DETECTED Final    Comment: (NOTE) The GeneXpert MRSA Assay (FDA approved for NASAL specimens only), is one component of a comprehensive MRSA colonization surveillance program. It is not intended to diagnose MRSA infection nor to guide or monitor treatment for MRSA infections. Test performance is not FDA approved in patients less than 52 years old. Performed at [facility], [address] [place], [place] [address]      Discharge Instructions:   Discharge Instructions     Diet - low sodium heart healthy   Complete by: As directed    Increase activity slowly   Complete by: As directed       Allergies as of 12/22/2021       Reactions   Contrast Media [iodinated Contrast Media] Hives, Rash   Penicillins Hives, Rash                Medication List     STOP taking these medications    colestipol 1 g tablet Commonly known as: COLESTID   dicyclomine 10 MG capsule Commonly known as: BENTYL       TAKE these medications    albuterol 108 (90 Base) MCG/ACT  inhaler Commonly known as: VENTOLIN HFA Inhale 2 puffs into the lungs every 6 (six) hours as needed for wheezing or shortness of breath.   ciprofloxacin 500 MG tablet Commonly known as: Cipro Take 1 tablet (500 mg total) by mouth 2 (two) times daily for 5 days.   fluticasone 50 MCG/ACT nasal spray Commonly known as: FLONASE Place 2 sprays into both nostrils daily as needed for allergies or rhinitis.   furosemide 40 MG tablet Commonly known as: LASIX Take 40 mg by mouth daily as needed for edema.   Humira 40 MG/0.4ML Pskt Generic drug: Adalimumab Inject 40 mg into the skin every 14 (fourteen)  days. Patient will start the maintenance Tuesday February 22,2022   mesalamine 500 MG CR capsule Commonly known as: Pentasa Take 1 capsule (500 mg total) by mouth 4 (four) times daily.   metroNIDAZOLE 500 MG tablet Commonly known as: Flagyl Take 1 tablet (500 mg total) by mouth 2 (two) times daily for 5 days.   omeprazole 40 MG capsule Commonly known as: PRILOSEC Take 40 mg by mouth daily before breakfast.   ondansetron 8 MG disintegrating tablet Commonly known as: ZOFRAN-ODT TAKE 1 TABLET (8 MG TOTAL) BY MOUTH 2 (TWO) TIMES DAILY AS NEEDED.   potassium chloride 10 MEQ CR capsule Commonly known as: MICRO-K Take 10 mEq by mouth 2 (two) times daily.   predniSONE 10 MG tablet Commonly known as: DELTASONE Take 4 tablets (40 mg total) by mouth daily with breakfast for 5 days, THEN 3 tablets (30 mg total) daily with breakfast for 5 days, THEN 2 tablets (20 mg total) daily with breakfast for 5 days, THEN 1 tablet (10 mg total) daily with breakfast for 5 days. Start taking on: December 22, 2021   promethazine 25 MG tablet Commonly known as: PHENERGAN Take one bid prn What changed:  how much to take how to take this when to take this reasons to take this   rivaroxaban 20 MG Tabs tablet Commonly known as: XARELTO Take 1 tablet (20 mg total) by mouth daily with supper.    valsartan-hydrochlorothiazide 160-12.5 MG tablet Commonly known as: DIOVAN-HCT Take 1 tablet by mouth daily.   Vitamin D3 50 MCG (2000 UT) Tabs TAKE 1 TABLET BY MOUTH EVERY DAY           If you experience worsening of your admission symptoms, develop shortness of breath, life threatening emergency, suicidal or homicidal thoughts you must seek medical attention immediately by calling 911 or calling your MD immediately  if symptoms less severe.   You must read complete instructions/literature along with all the possible adverse reactions/side effects for all the medicines you take and that have been prescribed to you. Take any new medicines after you have completely understood and accept all the possible adverse reactions/side effects.    Please note   You were cared for by a hospitalist during your hospital stay. If you have any questions about your discharge medications or the care you received while you were in the hospital after you are discharged, you can call the unit and asked to speak with the hospitalist on call if the hospitalist that took care of you is not available. Once you are discharged, your primary care physician will handle any further medical issues. Please note that NO REFILLS for any discharge medications will be authorized once you are discharged, as it is imperative that you return to your primary care physician (or establish a relationship with a primary care physician if you do not have one) for your aftercare needs so that they can reassess your need for medications and monitor your lab values.       Time coordinating discharge: 34 minutes  Signed:  Keymari Sato  [facility] 12/22/2021, 12:51 PM   Pager on [email or web address]. If 7PM-7AM, please contact night-coverage at [email or web address]

## 2021-12-24 ENCOUNTER — Emergency Department (HOSPITAL_COMMUNITY): Payer: 59

## 2021-12-24 ENCOUNTER — Encounter (HOSPITAL_COMMUNITY): Payer: Self-pay

## 2021-12-24 ENCOUNTER — Other Ambulatory Visit: Payer: Self-pay

## 2021-12-24 ENCOUNTER — Ambulatory Visit (INDEPENDENT_AMBULATORY_CARE_PROVIDER_SITE_OTHER): Payer: 59 | Admitting: Internal Medicine

## 2021-12-24 ENCOUNTER — Encounter (INDEPENDENT_AMBULATORY_CARE_PROVIDER_SITE_OTHER): Payer: Self-pay | Admitting: Internal Medicine

## 2021-12-24 ENCOUNTER — Inpatient Hospital Stay (HOSPITAL_COMMUNITY)
Admission: EM | Admit: 2021-12-24 | Discharge: 2021-12-27 | DRG: 389 | Disposition: A | Discharge status: Home / Self Care | Payer: 59 | Attending: Internal Medicine | Admitting: Internal Medicine

## 2021-12-24 VITALS — BP 117/80 | HR 73 | Temp 99.6°F | Ht 67.0 in | Wt 220.4 lb

## 2021-12-24 DIAGNOSIS — K567 Ileus, unspecified: Principal | ICD-10-CM | POA: Diagnosis present

## 2021-12-24 DIAGNOSIS — I10 Essential (primary) hypertension: Secondary | ICD-10-CM | POA: Diagnosis present

## 2021-12-24 DIAGNOSIS — K5010 Crohn's disease of large intestine without complications: Secondary | ICD-10-CM | POA: Diagnosis present

## 2021-12-24 DIAGNOSIS — Z79899 Other long term (current) drug therapy: Secondary | ICD-10-CM

## 2021-12-24 DIAGNOSIS — K5080 Crohn's disease of both small and large intestine without complications: Secondary | ICD-10-CM | POA: Diagnosis present

## 2021-12-24 DIAGNOSIS — Z86718 Personal history of other venous thrombosis and embolism: Secondary | ICD-10-CM

## 2021-12-24 DIAGNOSIS — Z79620 Long term (current) use of immunosuppressive biologic: Secondary | ICD-10-CM

## 2021-12-24 DIAGNOSIS — Z20822 Contact with and (suspected) exposure to covid-19: Secondary | ICD-10-CM | POA: Diagnosis present

## 2021-12-24 DIAGNOSIS — Z9049 Acquired absence of other specified parts of digestive tract: Secondary | ICD-10-CM

## 2021-12-24 DIAGNOSIS — K50819 Crohn's disease of both small and large intestine with unspecified complications: Secondary | ICD-10-CM | POA: Diagnosis not present

## 2021-12-24 DIAGNOSIS — Z6833 Body mass index (BMI) 33.0-33.9, adult: Secondary | ICD-10-CM

## 2021-12-24 DIAGNOSIS — E669 Obesity, unspecified: Secondary | ICD-10-CM | POA: Diagnosis present

## 2021-12-24 DIAGNOSIS — Z7901 Long term (current) use of anticoagulants: Secondary | ICD-10-CM

## 2021-12-24 DIAGNOSIS — K219 Gastro-esophageal reflux disease without esophagitis: Secondary | ICD-10-CM | POA: Diagnosis present

## 2021-12-24 DIAGNOSIS — F17210 Nicotine dependence, cigarettes, uncomplicated: Secondary | ICD-10-CM | POA: Diagnosis present

## 2021-12-24 DIAGNOSIS — R109 Unspecified abdominal pain: Secondary | ICD-10-CM | POA: Diagnosis not present

## 2021-12-24 DIAGNOSIS — Z880 Allergy status to penicillin: Secondary | ICD-10-CM

## 2021-12-24 DIAGNOSIS — E876 Hypokalemia: Secondary | ICD-10-CM | POA: Diagnosis present

## 2021-12-24 DIAGNOSIS — Z91041 Radiographic dye allergy status: Secondary | ICD-10-CM

## 2021-12-24 LAB — URINALYSIS, ROUTINE W REFLEX MICROSCOPIC
Bilirubin Urine: NEGATIVE
Glucose, UA: NEGATIVE mg/dL
Ketones, ur: NEGATIVE mg/dL
Leukocytes,Ua: NEGATIVE
Nitrite: NEGATIVE
Protein, ur: NEGATIVE mg/dL
Specific Gravity, Urine: 1.008 (ref 1.005–1.030)
pH: 5 (ref 5.0–8.0)

## 2021-12-24 LAB — CBC WITH DIFFERENTIAL/PLATELET
Abs Immature Granulocytes: 0.27 10*3/uL — ABNORMAL HIGH (ref 0.00–0.07)
Basophils Absolute: 0 10*3/uL (ref 0.0–0.1)
Basophils Relative: 0 %
Eosinophils Absolute: 0 10*3/uL (ref 0.0–0.5)
Eosinophils Relative: 0 %
HCT: 37.2 % (ref 36.0–46.0)
Hemoglobin: 12.7 g/dL (ref 12.0–15.0)
Immature Granulocytes: 3 %
Lymphocytes Relative: 9 %
Lymphs Abs: 0.8 10*3/uL (ref 0.7–4.0)
MCH: 37.1 pg — ABNORMAL HIGH (ref 26.0–34.0)
MCHC: 34.1 g/dL (ref 30.0–36.0)
MCV: 108.8 fL — ABNORMAL HIGH (ref 80.0–100.0)
Monocytes Absolute: 0.4 10*3/uL (ref 0.1–1.0)
Monocytes Relative: 5 %
Neutro Abs: 7.4 10*3/uL (ref 1.7–7.7)
Neutrophils Relative %: 83 %
Platelets: 245 10*3/uL (ref 150–400)
RBC: 3.42 MIL/uL — ABNORMAL LOW (ref 3.87–5.11)
RDW: 14.6 % (ref 11.5–15.5)
WBC: 8.9 10*3/uL (ref 4.0–10.5)
nRBC: 0 % (ref 0.0–0.2)

## 2021-12-24 LAB — COMPREHENSIVE METABOLIC PANEL
ALT: 34 U/L (ref 0–44)
AST: 57 U/L — ABNORMAL HIGH (ref 15–41)
Albumin: 3.1 g/dL — ABNORMAL LOW (ref 3.5–5.0)
Alkaline Phosphatase: 54 U/L (ref 38–126)
Anion gap: 7 (ref 5–15)
BUN: 16 mg/dL (ref 8–23)
CO2: 25 mmol/L (ref 22–32)
Calcium: 8.4 mg/dL — ABNORMAL LOW (ref 8.9–10.3)
Chloride: 108 mmol/L (ref 98–111)
Creatinine, Ser: 0.85 mg/dL (ref 0.44–1.00)
GFR, Estimated: >60 mL/min (ref >60)
Glucose, Bld: 102 mg/dL — ABNORMAL HIGH (ref 70–99)
Potassium: 3.2 mmol/L — ABNORMAL LOW (ref 3.5–5.1)
Sodium: 140 mmol/L (ref 135–145)
Total Bilirubin: 0.7 mg/dL (ref 0.3–1.2)
Total Protein: 5.7 g/dL — ABNORMAL LOW (ref 6.5–8.1)

## 2021-12-24 LAB — RESP PANEL BY RT-PCR (FLU A&B, COVID) ARPGX2
Influenza A by PCR: NEGATIVE
Influenza B by PCR: NEGATIVE
SARS Coronavirus 2 by RT PCR: NEGATIVE

## 2021-12-24 LAB — MAGNESIUM: Magnesium: 1.6 mg/dL — ABNORMAL LOW (ref 1.7–2.4)

## 2021-12-24 MED ORDER — KCL IN DEXTROSE-NACL 40-5-0.9 MEQ/L-%-% IV SOLN: INTRAVENOUS | Status: DC

## 2021-12-24 MED ORDER — HYDROMORPHONE HCL 1 MG/ML IJ SOLN
1.0000 mg | Freq: Once | INTRAMUSCULAR | Status: AC
  Administered 2021-12-24: 1 mg via INTRAVENOUS
  Filled 2021-12-24: qty 1

## 2021-12-24 MED ORDER — MAGNESIUM SULFATE 2 GM/50ML IV SOLN
2.0000 g | Freq: Once | INTRAVENOUS | Status: AC
  Administered 2021-12-24: 2 g via INTRAVENOUS
  Filled 2021-12-24: qty 50

## 2021-12-24 MED ORDER — HYDROMORPHONE HCL 1 MG/ML IJ SOLN
0.5000 mg | Freq: Once | INTRAMUSCULAR | Status: AC
  Administered 2021-12-24: 0.5 mg via INTRAVENOUS
  Filled 2021-12-24: qty 1

## 2021-12-24 MED ORDER — ONDANSETRON HCL 4 MG/2ML IJ SOLN
4.0000 mg | Freq: Once | INTRAMUSCULAR | Status: AC
  Administered 2021-12-24: 4 mg via INTRAVENOUS
  Filled 2021-12-24: qty 2

## 2021-12-24 MED ORDER — ACETAMINOPHEN 325 MG PO TABS
650.0000 mg | ORAL_TABLET | Freq: Four times a day (QID) | ORAL | Status: DC | PRN
  Administered 2021-12-24: 650 mg via ORAL
  Filled 2021-12-24: qty 2

## 2021-12-24 MED ORDER — HYDROMORPHONE HCL 1 MG/ML IJ SOLN
1.0000 mg | INTRAMUSCULAR | Status: DC | PRN
  Administered 2021-12-24 – 2021-12-27 (×14): 1 mg via INTRAVENOUS
  Filled 2021-12-24 (×16): qty 1

## 2021-12-24 MED ORDER — ACETAMINOPHEN 650 MG RE SUPP: 650.0000 mg | Freq: Four times a day (QID) | RECTAL | Status: DC | PRN

## 2021-12-24 MED ORDER — SODIUM CHLORIDE 0.9 % IV SOLN
12.5000 mg | Freq: Four times a day (QID) | INTRAVENOUS | Status: DC | PRN
  Administered 2021-12-25 – 2021-12-26 (×5): 12.5 mg via INTRAVENOUS
  Filled 2021-12-24 (×6): qty 0.5

## 2021-12-24 MED ORDER — HYDRALAZINE HCL 20 MG/ML IJ SOLN: 10.0000 mg | INTRAMUSCULAR | Status: DC | PRN

## 2021-12-24 MED ORDER — POTASSIUM CHLORIDE CRYS ER 20 MEQ PO TBCR
40.0000 meq | EXTENDED_RELEASE_TABLET | Freq: Once | ORAL | Status: AC
Start: 2021-12-24 — End: 2021-12-24
  Administered 2021-12-24: 40 meq via ORAL
  Filled 2021-12-24: qty 2

## 2021-12-24 MED ORDER — SIMETHICONE 80 MG PO CHEW
80.0000 mg | CHEWABLE_TABLET | Freq: Four times a day (QID) | ORAL | Status: AC
  Administered 2021-12-24 – 2021-12-25 (×4): 80 mg via ORAL
  Filled 2021-12-24 (×4): qty 1

## 2021-12-24 MED ORDER — SODIUM CHLORIDE 0.9 % IV BOLUS
1000.0000 mL | Freq: Once | INTRAVENOUS | Status: AC
  Administered 2021-12-24: 1000 mL via INTRAVENOUS

## 2021-12-24 NOTE — ED Triage Notes (Signed)
Patient dx with colitis tuesday and given Cipro and flagyl and complains of abdominal pain due to constipation and air inside her abdomen. States nausea; denies vomiting since Tuesday.

## 2021-12-24 NOTE — Patient Instructions (Signed)
Patient being sent to emergency room for evaluation.

## 2021-12-24 NOTE — ED Notes (Signed)
Patient transported to CT 

## 2021-12-24 NOTE — H&P (Signed)
History and Physical    KELSY POLACK NVV:[id number] DOB: 13-Oct-1961 DOA: 12/24/2021  PCP: Caryl Bis, MD   Patient coming from: Home  I have personally briefly reviewed patient's old medical records in [place]  Chief Complaint: Abdominal Pain  HPI: ERCIE ELIASEN is a 61 y.o. female with medical history significant for Crohn's, hypertension.  Patient presented to the ED with complaints of diffuse abdominal cramping that started since she was discharged from the hospital. Patient was recently hospitalized 1/9-1/10 for suspected Crohn's colitis.  She was treated with empiric IV antibiotics IV fluids and IV steroids.  She had a bowel movement in the hospital after she was given a laxative.  Patient reports since getting home, she has not had a bowel movement, she has barely been able to eat anything, she has not passed gas in 2 days.  No vomiting.  She reports diffuse abdominal pain.  She saw her gastroenterologist today and was referred to the ED.  ED Course: Stable vitals.  Potassium 3.2.  Magnesium 1.6.  Abdominal CT suggest ileus, no obstruction, and shows gallbladder sludge. EDP talked to Dr. Laural Golden, recommended simethicone, patient to lie on right side.  He will see in the morning..  1 L bolus given.  0.5 and then 1 mg of Dilaudid given.  Review of Systems: As per HPI all other systems reviewed and negative.  Past Medical History:  Diagnosis Date   Bronchitis    C. difficile diarrhea    Cough 01/28/2014   upper airway cough syndrome   Crohn's disease ([place])    Diverticulitis    DVT 06/2020   history of   Elevated transaminase level    GERD (gastroesophageal reflux disease)    Hypertension    Obesity    Pneumonia 12/2013   Tobacco abuse     Past Surgical History:  Procedure Laterality Date   ABDOMINAL HYSTERECTOMY  12/13/1978   APPENDECTOMY     BIOPSY  11/11/2021   Procedure: BIOPSY;  Surgeon: Rogene Houston, MD;  [facility];  Service:  Endoscopy;;  bx of polyp and small bowel   COLON SURGERY     COLONOSCOPY     COLONOSCOPY N/A 08/16/2014   Procedure: COLONOSCOPY;  Surgeon: Rogene Houston, MD;  [facility];  Service: Endoscopy;  Laterality: N/A;  1200-rescheduled to 9/4 @ 10:45 Ann notified pt   COLONOSCOPY WITH PROPOFOL N/A 11/11/2021   Procedure: COLONOSCOPY WITH PROPOFOL;  Surgeon: Rogene Houston, MD;  [facility];  Service: Endoscopy;  Laterality: N/A;  2:05   ESOPHAGOGASTRODUODENOSCOPY  06/16/2012   Procedure: ESOPHAGOGASTRODUODENOSCOPY (EGD);  Surgeon: Rogene Houston, MD;  [facility];  Service: Endoscopy;  Laterality: N/A;  10:30 AM   hemocolectomy  12/14/2003   right   Illeocecal Resection  2005   INCISIONAL HERNIA REPAIR N/A 08/24/2021   Procedure: HERNIA REPAIR INCISIONAL WITH MESH;  Surgeon: Aviva Signs, MD;  Location: [facility];  Service: General;  Laterality: N/A;   LAPAROSCOPIC SIGMOID COLECTOMY N/A 01/07/2015   Procedure: LOW ANTERIOR COLON RESECTION;  Surgeon: Fanny Skates, MD;  Location: [facility];  Service: General;  Laterality: N/A;   SHOULDER ARTHROSCOPY WITH LABRAL REPAIR Right 01/20/2016   Procedure: SHOULDER ARTHROSCOPY WITH LABRAL REPAIR;  Surgeon: Meredith Pel, MD;  Location: [facility];  Service: Orthopedics;  Laterality: Right;     reports that she has been smoking cigarettes. She has a 20.00 pack-year smoking history. She has  never used smokeless tobacco. She reports that she does not currently use alcohol. She reports that she does not use drugs.  Allergies  Allergen Reactions   Contrast Media [Iodinated Contrast Media] Hives and Rash   Penicillins Hives and Rash             Family History  Problem Relation Age of Onset   Healthy Mother    Lung cancer Father        smoked   Allergic Disorder Brother    Healthy Daughter     Prior to Admission medications   Medication Sig Start Date End Date Taking? Authorizing Provider  Adalimumab (HUMIRA) 40  MG/0.4ML PSKT Inject 40 mg into the skin every 14 (fourteen) days. Patient will start the maintenance Tuesday February 22,2022 07/14/21   Rogene Houston, MD  albuterol (VENTOLIN HFA) 108 (90 Base) MCG/ACT inhaler Inhale 2 puffs into the lungs every 6 (six) hours as needed for wheezing or shortness of breath. 11/09/19   [provider]  ciprofloxacin (CIPRO) 500 MG tablet Take 1 tablet (500 mg total) by mouth 2 (two) times daily for 5 days. 12/22/21 12/27/21  Jennye Boroughs, MD  colestipol (COLESTID) 1 g tablet TAKE 2 TABS ONCE DAILY X 1 WEEK, MAY INCREASE TO 2 TABS TWICE DAILY IF NEEDED.-DO NOT TAKE WITHIN 2 HRS OF OTHER MEDS 12/22/21   Rehman, Mechele Dawley, MD  fluticasone (FLONASE) 50 MCG/ACT nasal spray Place 2 sprays into both nostrils daily as needed for allergies or rhinitis.    [provider]  furosemide (LASIX) 40 MG tablet Take 40 mg by mouth daily as needed for edema.    [provider]  HUMIRA PEN 40 MG/0.4ML PNKT RX#2 INJECT 40MG SUBCUTANEOUSLY EVERY OTHER WEEK 12/22/21   Rehman, Mechele Dawley, MD  mesalamine (PENTASA) 500 MG CR capsule Take 1 capsule (500 mg total) by mouth 4 (four) times daily. 11/13/21   Rehman, Mechele Dawley, MD  metroNIDAZOLE (FLAGYL) 500 MG tablet Take 1 tablet (500 mg total) by mouth 2 (two) times daily for 5 days. 12/22/21 12/27/21  Jennye Boroughs, MD  omeprazole (PRILOSEC) 40 MG capsule Take 40 mg by mouth daily before breakfast.    [provider]  ondansetron (ZOFRAN-ODT) 8 MG disintegrating tablet TAKE 1 TABLET (8 MG TOTAL) BY MOUTH 2 (TWO) TIMES DAILY AS NEEDED. 11/23/21   Carlan, Chelsea L, NP  potassium chloride (MICRO-K) 10 MEQ CR capsule Take 10 mEq by mouth 2 (two) times daily.     [provider]  predniSONE (DELTASONE) 10 MG tablet Take 4 tablets (40 mg total) by mouth daily with breakfast for 5 days, THEN 3 tablets (30 mg total) daily with breakfast for 5 days, THEN 2 tablets (20 mg total) daily with breakfast for 5 days, THEN 1  tablet (10 mg total) daily with breakfast for 5 days. 12/22/21 01/11/22  Jennye Boroughs, MD  promethazine (PHENERGAN) 25 MG tablet Take one bid prn Patient taking differently: Take 25 mg by mouth every 6 (six) hours as needed for nausea or vomiting. Take one bid prn 09/23/21   Rogene Houston, MD  rivaroxaban (XARELTO) 20 MG TABS tablet Take 1 tablet (20 mg total) by mouth daily with supper. 11/12/21   Rehman, Mechele Dawley, MD  valsartan-hydrochlorothiazide (DIOVAN-HCT) 160-12.5 MG per tablet Take 1 tablet by mouth daily.     [provider]    Physical Exam: Vitals:   12/24/21 1430 12/24/21 1500 12/24/21 1530 12/24/21 1545  BP: 134/75 127/79 Marland Kitchen)  144/68   Pulse:  72 68 86  Resp: 19 18 16  (!) 24  Temp:      TempSrc:      SpO2:  92% 93% 97%  Weight:      Height:        Constitutional: NAD, calm, comfortable Vitals:   12/24/21 1430 12/24/21 1500 12/24/21 1530 12/24/21 1545  BP: 134/75 127/79 (!) 144/68   Pulse:  72 68 86  Resp: 19 18 16  (!) 24  Temp:      TempSrc:      SpO2:  92% 93% 97%  Weight:      Height:       Eyes: PERRL, lids and conjunctivae normal ENMT: Mucous membranes are moist.   Neck: normal, supple, no masses, no thyromegaly Respiratory: clear to auscultation bilaterally, no wheezing, no crackles. Normal respiratory effort. No accessory muscle use.  Cardiovascular: Regular rate and rhythm, no murmurs / rubs / gallops. No extremity edema.  Lower extremities warm Abdomen: Abdomen full, soft, no tenderness, no masses palpated. No hepatosplenomegaly. Bowel sounds positive.  Musculoskeletal: no clubbing / cyanosis. No joint deformity upper and lower extremities.  Skin: no rashes, lesions, ulcers. No induration Neurologic: No apparent cranial abnormality, moving extremities spontaneously. Psychiatric: Normal judgment and insight. Alert and oriented x 3. Normal mood.   Labs on Admission: I have personally reviewed following labs and imaging studies  CBC: Recent  Labs  Lab 12/21/21 0035 12/22/21 0420 12/24/21 1351  WBC 12.6* 16.0* 8.9  NEUTROABS 11.0* 15.1* 7.4  HGB 15.1* 13.2 12.7  HCT 41.7 36.5 37.2  MCV 106.1* 109.9* 108.8*  PLT 292 258 630   Basic Metabolic Panel: Recent Labs  Lab 12/21/21 0035 12/21/21 1240 12/22/21 0420 12/24/21 1351  NA 137  --  136 140  K 2.6* 4.2 3.7 3.2*  CL 105  --  107 108  CO2 22  --  22 25  GLUCOSE 133*  --  148* 102*  BUN 23  --  21 16  CREATININE 0.80  --  0.73 0.85  CALCIUM 7.8*  --  7.7* 8.4*  MG 1.2* 1.8 1.6* 1.6*   GFR: Estimated Creatinine Clearance: 81.7 mL/min (by C-G formula based on SCr of 0.85 mg/dL). Liver Function Tests: Recent Labs  Lab 12/21/21 0035 12/22/21 0420 12/24/21 1351  AST 21 14* 57*  ALT 15 14 34  ALKPHOS 58 49 54  BILITOT 1.3* 0.4 0.7  PROT 6.1* 5.5* 5.7*  ALBUMIN 3.2* 2.9* 3.1*    Urine analysis:    Component Value Date/Time   COLORURINE YELLOW 12/24/2021 1421   APPEARANCEUR CLEAR 12/24/2021 1421   LABSPEC 1.008 12/24/2021 1421   PHURINE 5.0 12/24/2021 1421   GLUCOSEU NEGATIVE 12/24/2021 1421   HGBUR SMALL (A) 12/24/2021 1421   BILIRUBINUR NEGATIVE 12/24/2021 1421   KETONESUR NEGATIVE 12/24/2021 1421   PROTEINUR NEGATIVE 12/24/2021 1421   UROBILINOGEN 0.2 12/27/2014 0950   NITRITE NEGATIVE 12/24/2021 1421   LEUKOCYTESUR NEGATIVE 12/24/2021 1421    Radiological Exams on Admission: CT ABDOMEN PELVIS WO CONTRAST  Result Date: 12/24/2021 CLINICAL DATA:  Acute, non localized lower abdominal pain. History of Crohn's disease with bowel resection. Recently diagnosed colitis. Constipation and nausea. Contrast allergy. EXAM: CT ABDOMEN AND PELVIS WITHOUT CONTRAST TECHNIQUE: Multidetector CT imaging of the abdomen and pelvis was performed following the standard protocol without IV contrast. RADIATION DOSE REDUCTION: This exam was performed according to the departmental dose-optimization program which includes automated exposure control, adjustment of the mA and/or  kV according to patient size and/or use of iterative reconstruction technique. COMPARISON:  12/21/2021 FINDINGS: Lower chest: Mild bilateral dependent atelectasis. Stable right lower lobe subpleural linear and somewhat rounded atelectasis/scarring. Hepatobiliary: The gallbladder is less distended and somewhat higher in density, suggesting sludge. Unremarkable liver. 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 Spleen: Normal in size without focal abnormality. Adrenals/Urinary Tract: Adrenal glands are unremarkable. Kidneys are normal, without renal calculi, focal lesion, or hydronephrosis. Bladder is unremarkable. Stomach/Bowel: Unremarkable stomach and small bowel. A right colon suture line is again demonstrated without visualization of the appendix. There is also a distal sigmoid colon anastomosis. Mildly progressive dilatation of the right and transverse colon, containing gas and fluid. Vascular/Lymphatic: Atheromatous arterial calcifications without aneurysm. No enlarged lymph nodes. Reproductive: Status post hysterectomy. No adnexal masses. Other: No significant change in a midline surgical scar containing a small oval fluid collection and adjacent soft tissue stranding in the subcutaneous fat. Musculoskeletal: Mild lumbar and lower thoracic spine degenerative changes. IMPRESSION: 1. Mildly progressive proximal transverse colon ileus no findings to suggest obstruction. 2. Possible sludge in the gallbladder. Electronically Signed   By: Claudie Revering M.D.   On: 12/24/2021 15:13    EKG: None  Assessment/Plan Principal Problem:   Ileus (HCC) Active Problems:   HTN (hypertension)   Crohn's colitis ([place])   Ileus with Crohn's colitis-CT shows mildly progressive proximal transverse colon ileus, no findings to suggest obstruction.  Recent hospitalization for Crohn's colitis, treated with and discharged on ciprofloxacin and  metronidazole and steroids.  History of right hemicolectomy with sigmoidectomy.  Also with Dr. Laural Golden. -N.p.o.  -GI to see tomorrow -Continue simethicone x 4 doses -IV Dilaudid 18m as needed - 1 L bolus given , continue D 5 N/s + 40kcl 75cc/hr x 15hrs -As needed Phenergan -No vomiting, NG tube held for now -Hold antibiotics, Humira, steroids for now.  Electrolyte abnormalities-hypokalemia 3.2, hypomagnesemia 1.6. - Replete  Hypertension-stable. -Hold valsartan HCTZ for now -As needed hydralazine  DVT -Hold Xarelto while n.p.o.  DVT prophylaxis: heparin Code Status: Full Family Communication:  None at bedside Disposition Plan:  ~ 1 -2 days Consults called: GI Admission status: Obs tele   EBethena RoysMD [facility]  12/24/2021, 8:09 PM

## 2021-12-24 NOTE — ED Notes (Signed)
Instructed pt to lay on right side.

## 2021-12-24 NOTE — ED Triage Notes (Signed)
States she was discharged from the hospital 2 days ago and advised she needed to return by Dr Laural Golden, states he colon is not working

## 2021-12-24 NOTE — Progress Notes (Signed)
Presenting complaint;  Abdominal pain  Database and subjective:  Patient is 61 year old Caucasian female with history of small and large bowel Crohn's disease status post ileus sick ectomy in November 2005 at [facility], status post low anterior resection for diverticulitis in January 2016 who has been maintained on Humira and oral mesalamine.  She underwent colonoscopy in November 2022 which revealed active disease involving neoterminal ileum and noncritical anastomotic stricture.  She had focal mucosal edema at ascending colon.  Biopsy revealed hyperplastic changes and sessile serrated features.  Biopsy from distal sigmoid colon revealed focal active colitis. Last year she was treated for C. difficile colitis.  C. difficile antigen and toxin test were negative in October 2022.  Patient states she was in usual state of health until 1 week ago when she felt that she had constipated.  She did experience abdominal cramping and nausea and after waiting for 3 days she took 1 fleets enema early morning 4 days ago or on 12/20/2021.  She only passed some liquid.  She therefore took 2 Dulcolax tablets and within 2 hours of it she began to have severe cramping and vomited multiple times.  She says she had very high eating and vomiting vomitus was mainly bilious.  Following this episode she did notice scant amount of fresh blood per rectum. She reported to emergency room on the evening of 12/20/2021. Lab data revealed WBC of 12.6 with H&H of 15.1 and 41.7.  MCV was 106.1. Chemistry panel was pertinent for serum potassium of 2.6 glucose of 133 BUN of 23 and creatinine of 0.80.  Serum calcium was 7.8 and albumin was 3.2.  Bilirubin was 1.3.  Serum lactic acid was within normal limits.  Serum magnesium was also low. CT abdomen and pelvis was obtained without contrast and revealed mildly dilated fluid-filled loops of colon.  There is slight haziness in the region of descending colon and splenic flexure.  There was  no dilation to the small bowel.  Evidence of colorectal and prior small bowel surgery. Patient was admitted to hospitalist service.  She was treated with IV fluids KCl and given magnesium sulfate.  She was also begun on IV antibiotics.  GI consultation was obtained.  She was seen by Ms. Vicente Males, NP and Dr. Hurshel Keys.  Her CRP was mildly elevated.  Stool studies were recommended if she had diarrhea. By the morning of 12/22/2021 she was feeling much better.  She was therefore discharged on Cipro and Flagyl. Patient contacted me yesterday to let me know that she was not feeling any better.  She complained of nausea severe intermittent abdominal pain which started on the left side of the abdomen and spread laterally.  She has not had a bowel movement since she left the hospital.  She also has not passed flatus in 2 days.  She took her marijuana yesterday.  She did have her marijuana drug and antibody level drawn prior to that but these results are pending. She has not experienced fever or chills.  She describes her pain to be severe and intractable.  She only has noted scant amount of fresh blood per rectum but no frank bleeding. Her weight has gone up by 5 pounds since her office visit of 10/07/2019.  Current Medications: Outpatient Encounter Medications as of 12/24/2021  Medication Sig   Adalimumab (HUMIRA) 40 MG/0.4ML PSKT Inject 40 mg into the skin every 14 (fourteen) days. Patient will start the maintenance Tuesday February 22,2022   albuterol (VENTOLIN HFA) 108 (90  Base) MCG/ACT inhaler Inhale 2 puffs into the lungs every 6 (six) hours as needed for wheezing or shortness of breath.   ciprofloxacin (CIPRO) 500 MG tablet Take 1 tablet (500 mg total) by mouth 2 (two) times daily for 5 days.   colestipol (COLESTID) 1 g tablet TAKE 2 TABS ONCE DAILY X 1 WEEK, MAY INCREASE TO 2 TABS TWICE DAILY IF NEEDED.-DO NOT TAKE WITHIN 2 HRS OF OTHER MEDS   fluticasone (FLONASE) 50 MCG/ACT nasal spray Place 2 sprays  into both nostrils daily as needed for allergies or rhinitis.   furosemide (LASIX) 40 MG tablet Take 40 mg by mouth daily as needed for edema.   HUMIRA PEN 40 MG/0.4ML PNKT RX#2 INJECT 40MG SUBCUTANEOUSLY EVERY OTHER WEEK   mesalamine (PENTASA) 500 MG CR capsule Take 1 capsule (500 mg total) by mouth 4 (four) times daily.   metroNIDAZOLE (FLAGYL) 500 MG tablet Take 1 tablet (500 mg total) by mouth 2 (two) times daily for 5 days.   omeprazole (PRILOSEC) 40 MG capsule Take 40 mg by mouth daily before breakfast.   ondansetron (ZOFRAN-ODT) 8 MG disintegrating tablet TAKE 1 TABLET (8 MG TOTAL) BY MOUTH 2 (TWO) TIMES DAILY AS NEEDED.   potassium chloride (MICRO-K) 10 MEQ CR capsule Take 10 mEq by mouth 2 (two) times daily.    predniSONE (DELTASONE) 10 MG tablet Take 4 tablets (40 mg total) by mouth daily with breakfast for 5 days, THEN 3 tablets (30 mg total) daily with breakfast for 5 days, THEN 2 tablets (20 mg total) daily with breakfast for 5 days, THEN 1 tablet (10 mg total) daily with breakfast for 5 days.   promethazine (PHENERGAN) 25 MG tablet Take one bid prn (Patient taking differently: Take 25 mg by mouth every 6 (six) hours as needed for nausea or vomiting. Take one bid prn)   rivaroxaban (XARELTO) 20 MG TABS tablet Take 1 tablet (20 mg total) by mouth daily with supper.   valsartan-hydrochlorothiazide (DIOVAN-HCT) 160-12.5 MG per tablet Take 1 tablet by mouth daily.    [DISCONTINUED] Cholecalciferol (VITAMIN D3) 50 MCG (2000 UT) TABS TAKE 1 TABLET BY MOUTH EVERY DAY (Patient not taking: Reported on 11/02/2021)   No facility-administered encounter medications on file as of 12/24/2021.     Objective: Blood pressure 117/80, pulse 73, temperature 99.6 F (37.6 C), temperature source Oral, height 5' 7"  (1.702 m), weight 220 lb 6.4 oz (100 kg). Patient appears to be very uncomfortable sitting in a chair.  She appears to be in pain intermittently. Conjunctiva is pink. Sclera is  nonicteric Oropharyngeal mucosa is normal. No neck masses or thyromegaly noted. Cardiac exam with regular rhythm normal S1 and S2. No murmur or gallop noted. Lungs are clear to auscultation. Abdomen is distended.  Bowel sounds are hypoactive.  Percussion note is very tympanitic across upper abdomen.  Palpation reveals no tenderness or guarding.  No organomegaly or masses. Rectal examination deferred.  Trace to 1+ pitting edema around ankles. No LE edema or clubbing noted.  Labs/studies Results:   CBC Latest Ref Rng & Units 12/22/2021 12/21/2021 10/26/2021  WBC 4.0 - 10.5 K/uL 16.0(H) 12.6(H) 6.4  Hemoglobin 12.0 - 15.0 g/dL 13.2 15.1(H) 14.6  Hematocrit 36.0 - 46.0 % 36.5 41.7 41.2  Platelets 150 - 400 K/uL 258 292 234    CMP Latest Ref Rng & Units 12/22/2021 12/21/2021 12/21/2021  Glucose 70 - 99 mg/dL 148(H) - 133(H)  BUN 8 - 23 mg/dL 21 - 23  Creatinine 0.44 - 1.00  mg/dL 0.73 - 0.80  Sodium 135 - 145 mmol/L 136 - 137  Potassium 3.5 - 5.1 mmol/L 3.7 4.2 2.6(LL)  Chloride 98 - 111 mmol/L 107 - 105  CO2 22 - 32 mmol/L 22 - 22  Calcium 8.9 - 10.3 mg/dL 7.7(L) - 7.8(L)  Total Protein 6.5 - 8.1 g/dL 5.5(L) - 6.1(L)  Total Bilirubin 0.3 - 1.2 mg/dL 0.4 - 1.3(H)  Alkaline Phos 38 - 126 U/L 49 - 58  AST 15 - 41 U/L 14(L) - 21  ALT 0 - 44 U/L 14 - 15    Hepatic Function Latest Ref Rng & Units 12/22/2021 12/21/2021 10/26/2021  Total Protein 6.5 - 8.1 g/dL 5.5(L) 6.1(L) 6.2  Albumin 3.5 - 5.0 g/dL 2.9(L) 3.2(L) -  AST 15 - 41 U/L 14(L) 21 33  ALT 0 - 44 U/L 14 15 17   Alk Phosphatase 38 - 126 U/L 49 58 -  Total Bilirubin 0.3 - 1.2 mg/dL 0.4 1.3(H) 0.7  Bilirubin, Direct 0.0 - 0.2 mg/dL - - -    Lab Results  Component Value Date   CRP 1.7 (H) 12/21/2021      Assessment:  #1.  Abdominal pain associate with nausea and no flatus for 2 days in a patient with known history of ileal and Crohn's disease who was briefly hospitalized earlier this week after suffering intractable nausea vomiting  and abdominal pain after she ingested Dulcolax tablets.  CT revealed mildly dilated fluid-filled colon but small bowel caliber was normal.  There was some wall thickening to the neoterminal ileum. Patient's presentation is concerning for colonic ileus.  She could have colitis.  She could also have colonic obstruction although although no stricture was identified on recent colonoscopy of November 2022. Given her symptoms she cannot be managed on an outpatient basis and therefore will be sent to emergency room for evaluation and possible hospitalization.    Plan:  I recommended the patient evaluated in the emergency room.  Outpatient evaluation is not appropriate given that she is having severe pain and exam suggest colonic ileus and/or obstruction. I contacted Dr. Gilford Raid to the emergency room and provided information pertaining to patient's chronic and acute illness. Will plan to see patient back in the office in 1 month.

## 2021-12-24 NOTE — ED Provider Notes (Addendum)
[facility] Provider Note   CSN: [id number] Arrival date & time: 12/24/21  1213     History  Chief Complaint  Patient presents with   Abdominal Pain    Elizabeth [name] is a 61 y.o. female.   Abdominal Pain Associated symptoms: constipation and nausea   Associated symptoms: no chest pain, no chills, no fever, no shortness of breath and no vomiting        Elizabeth [name] is a 61 y.o. female with past medical history significant for Crohn's disease, hypertension, GERD, IBS she has underwent an ileocecal resection in 2005.  she presents to the Emergency Department at the request of her gastroenterologist, Dr. Laural Golden.  Patient was seen in this emergency department and admitted to the hospital on 12/20/2021.  She has been treated with IV antibiotics during her hospital stay, she was discharged on Cipro and Flagyl without significant improvement of her symptoms.  She was discharged 2 days later and had a follow-up appointment with gastroenterology today.  Since her hospital discharge, she notes having waxing and waning pain of her lower abdomen, nausea, and has not had a bowel movement since her hospital discharge.  No flatus x2  days.  She denies fever, chills, vomiting chest pain or shortness of breath.  She is anticoagulated on Xarelto due to diagnosis of prior DVT    Home Medications Prior to Admission medications   Medication Sig Start Date End Date Taking? Authorizing Provider  Adalimumab (HUMIRA) 40 MG/0.4ML PSKT Inject 40 mg into the skin every 14 (fourteen) days. Patient will start the maintenance Tuesday February 22,2022 07/14/21   Rogene Houston, MD  albuterol (VENTOLIN HFA) 108 (90 Base) MCG/ACT inhaler Inhale 2 puffs into the lungs every 6 (six) hours as needed for wheezing or shortness of breath. 11/09/19   [provider]  ciprofloxacin (CIPRO) 500 MG tablet Take 1 tablet (500 mg total) by mouth 2 (two) times daily for 5 days. 12/22/21 12/27/21  Jennye Boroughs, MD  colestipol (COLESTID) 1 g tablet TAKE 2 TABS ONCE DAILY X 1 WEEK, MAY INCREASE TO 2 TABS TWICE DAILY IF NEEDED.-DO NOT TAKE WITHIN 2 HRS OF OTHER MEDS 12/22/21   Rehman, Mechele Dawley, MD  fluticasone (FLONASE) 50 MCG/ACT nasal spray Place 2 sprays into both nostrils daily as needed for allergies or rhinitis.    [provider]  furosemide (LASIX) 40 MG tablet Take 40 mg by mouth daily as needed for edema.    [provider]  HUMIRA PEN 40 MG/0.4ML PNKT RX#2 INJECT 40MG SUBCUTANEOUSLY EVERY OTHER WEEK 12/22/21   Rehman, Mechele Dawley, MD  mesalamine (PENTASA) 500 MG CR capsule Take 1 capsule (500 mg total) by mouth 4 (four) times daily. 11/13/21   Rehman, Mechele Dawley, MD  metroNIDAZOLE (FLAGYL) 500 MG tablet Take 1 tablet (500 mg total) by mouth 2 (two) times daily for 5 days. 12/22/21 12/27/21  Jennye Boroughs, MD  omeprazole (PRILOSEC) 40 MG capsule Take 40 mg by mouth daily before breakfast.    [provider]  ondansetron (ZOFRAN-ODT) 8 MG disintegrating tablet TAKE 1 TABLET (8 MG TOTAL) BY MOUTH 2 (TWO) TIMES DAILY AS NEEDED. 11/23/21   Carlan, Chelsea L, NP  potassium chloride (MICRO-K) 10 MEQ CR capsule Take 10 mEq by mouth 2 (two) times daily.     [provider]  predniSONE (DELTASONE) 10 MG tablet Take 4 tablets (40 mg total) by mouth daily with breakfast for 5 days, THEN 3 tablets (30  mg total) daily with breakfast for 5 days, THEN 2 tablets (20 mg total) daily with breakfast for 5 days, THEN 1 tablet (10 mg total) daily with breakfast for 5 days. 12/22/21 01/11/22  Jennye Boroughs, MD  promethazine (PHENERGAN) 25 MG tablet Take one bid prn Patient taking differently: Take 25 mg by mouth every 6 (six) hours as needed for nausea or vomiting. Take one bid prn 09/23/21   Rogene Houston, MD  rivaroxaban (XARELTO) 20 MG TABS tablet Take 1 tablet (20 mg total) by mouth daily with supper. 11/12/21   Rehman, Mechele Dawley, MD  valsartan-hydrochlorothiazide (DIOVAN-HCT)  160-12.5 MG per tablet Take 1 tablet by mouth daily.     [provider]      Allergies    Contrast media [iodinated contrast media] and Penicillins    Review of Systems   Review of Systems  Constitutional:  Negative for chills and fever.  Respiratory:  Negative for shortness of breath.   Cardiovascular:  Negative for chest pain.  Gastrointestinal:  Positive for abdominal pain, constipation and nausea. Negative for vomiting.  All other systems reviewed and are negative.  Physical Exam Updated Vital Signs BP (!) 144/68    Pulse 68    Temp 98 F (36.7 C) (Oral)    Resp 16    Ht 5' 7"  (1.702 m)    Wt 96.2 kg    SpO2 93%    BMI 33.20 kg/m  Physical Exam Vitals and nursing note reviewed.  Constitutional:      General: She is not in acute distress.    Appearance: She is well-developed. She is not ill-appearing.  Cardiovascular:     Rate and Rhythm: Normal rate and regular rhythm.     Pulses: Normal pulses.  Pulmonary:     Effort: Pulmonary effort is normal.  Abdominal:     General: There is distension.     Palpations: Abdomen is soft.     Tenderness: There is abdominal tenderness.     Comments: Diffuse tenderness of the lower abdomen.  Mild guarding is present.  Hypoactive bowel sounds present.  Musculoskeletal:        General: Normal range of motion.  Skin:    General: Skin is warm.     Capillary Refill: Capillary refill takes less than 2 seconds.     Findings: No rash.  Neurological:     General: No focal deficit present.     Mental Status: She is alert.     Sensory: No sensory deficit.     Motor: No weakness.    ED Results / Procedures / Treatments   Labs (all labs ordered are listed, but only abnormal results are displayed) Labs Reviewed  CBC WITH DIFFERENTIAL/PLATELET - Abnormal; Notable for the following components:      Result Value   RBC 3.42 (*)    MCV 108.8 (*)    MCH 37.1 (*)    Abs Immature Granulocytes 0.27 (*)    All other components within  normal limits  COMPREHENSIVE METABOLIC PANEL - Abnormal; Notable for the following components:   Potassium 3.2 (*)    Glucose, Bld 102 (*)    Calcium 8.4 (*)    Total Protein 5.7 (*)    Albumin 3.1 (*)    AST 57 (*)    All other components within normal limits  URINALYSIS, ROUTINE W REFLEX MICROSCOPIC - Abnormal; Notable for the following components:   Hgb urine dipstick SMALL (*)    Bacteria, UA RARE (*)  All other components within normal limits  MAGNESIUM - Abnormal; Notable for the following components:   Magnesium 1.6 (*)    All other components within normal limits  RESP PANEL BY RT-PCR (FLU A&B, COVID) ARPGX2    EKG None  Radiology CT ABDOMEN PELVIS WO CONTRAST  Result Date: 12/24/2021 CLINICAL DATA:  Acute, non localized lower abdominal pain. History of Crohn's disease with bowel resection. Recently diagnosed colitis. Constipation and nausea. Contrast allergy. EXAM: CT ABDOMEN AND PELVIS WITHOUT CONTRAST TECHNIQUE: Multidetector CT imaging of the abdomen and pelvis was performed following the standard protocol without IV contrast. RADIATION DOSE REDUCTION: This exam was performed according to the departmental dose-optimization program which includes automated exposure control, adjustment of the mA and/or kV according to patient size and/or use of iterative reconstruction technique. COMPARISON:  12/21/2021 FINDINGS: Lower chest: Mild bilateral dependent atelectasis. Stable right lower lobe subpleural linear and somewhat rounded atelectasis/scarring. Hepatobiliary: The gallbladder is less distended and somewhat higher in density, suggesting sludge. Unremarkable liver. 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 Spleen: Normal in size without focal abnormality. Adrenals/Urinary Tract: Adrenal glands are unremarkable. Kidneys are normal, without renal calculi, focal lesion, or hydronephrosis. Bladder  is unremarkable. Stomach/Bowel: Unremarkable stomach and small bowel. A right colon suture line is again demonstrated without visualization of the appendix. There is also a distal sigmoid colon anastomosis. Mildly progressive dilatation of the right and transverse colon, containing gas and fluid. Vascular/Lymphatic: Atheromatous arterial calcifications without aneurysm. No enlarged lymph nodes. Reproductive: Status post hysterectomy. No adnexal masses. Other: No significant change in a midline surgical scar containing a small oval fluid collection and adjacent soft tissue stranding in the subcutaneous fat. Musculoskeletal: Mild lumbar and lower thoracic spine degenerative changes. IMPRESSION: 1. Mildly progressive proximal transverse colon ileus no findings to suggest obstruction. 2. Possible sludge in the gallbladder. Electronically Signed   By: Claudie Revering M.D.   On: 12/24/2021 15:13    Procedures Procedures    Medications Ordered in ED Medications  magnesium sulfate IVPB 2 g 50 mL (has no administration in time range)  HYDROmorphone (DILAUDID) injection 0.5 mg (has no administration in time range)  simethicone (MYLICON) chewable tablet 80 mg (has no administration in time range)  sodium chloride 0.9 % bolus 1,000 mL (0 mLs Intravenous Stopped 12/24/21 1522)  ondansetron (ZOFRAN) injection 4 mg (4 mg Intravenous Given 12/24/21 1359)  HYDROmorphone (DILAUDID) injection 1 mg (1 mg Intravenous Given 12/24/21 1401)  potassium chloride SA (KLOR-CON M) CR tablet 40 mEq (40 mEq Oral Given 12/24/21 1540)    ED Course/ Medical Decision Making/ A&P                          Medical Decision Making  This patient presents to the ED for concern of lower abdominal pain and history of Crohn's disease this involves an extensive number of treatment options, and is a complaint that carries with it a high risk of complications and morbidity.  The differential diagnosis includes ileus, acute diverticulitis, sedation  of Crohn's, intestinal abscess and bowel obstruction   Co morbidities that complicate the patient evaluation  History of Crohn's, anticoagulable state, prior ileocecal resection 2005   Additional history obtained:  Additional history obtained from her gastroenterologist, Dr. Dereck Leep   Lab Tests:  I Ordered, and personally interpreted labs.  The pertinent results include: No leukocytosis, chemistries show mild hypokalemia with potassium of 3.2.  AST  slightly elevated a 57, remaining LFTs unremarkable, magnesium low at 1.6, urinalysis without evidence of infection  Imaging Studies ordered:  I ordered imaging studies including CT abdomen pelvis I independently visualized and interpreted imaging which showed colonic ileus without obstruction I agree with the radiologist interpretation   Cardiac Monitoring:  The patient was maintained on a cardiac monitor.  I personally viewed and interpreted the cardiac monitored which showed an underlying rhythm of: sinus rhythm   Medicines ordered and prescription drug management:  I ordered medication including antiemetic and pain medication Reevaluation of the patient after these medicines showed that the patient pain is improved although she  required additional dosing of pain medication    Consultations Obtained:  I requested consultation with Dr. Dereck Leep,  and discussed lab and imaging findings as well as pertinent plan - they recommend: Hospital admission with patient to remain n.p.o. except for oral medications ordered.  He recommends Mylicon 80 mg x 4 doses.  Have patient rotate in the bed and lying on right side.  Ask that hospitalist admit and he will see patient in consultation tomorrow     Reevaluation:  After the interventions noted above, I reevaluated the patient and found that they have abdominal pain had temporarily improved, but returned, and required additional dosing of pain medication   Social Determinants of  Health:  Cigarette smoker   Dispostion:  After consideration of the diagnostic results and the patients response to treatment, I feel that the patent would benefit from hospital admission given her history of Crohn's disease, prior ileocecal resection and current colonic ileus  Discussed findings with [facility] hospitalist, Dr. Arlyce Dice who agrees to admit.       Final Clinical Impression(s) / ED Diagnoses Final diagnoses:  Ileus [facility])    Rx / [place] Orders ED Discharge Orders     None         Kem Parkinson, PA-C 12/24/21 1655    Kem Parkinson, PA-C 12/24/21 1941    Isla Pence, MD 12/26/21 [phone]

## 2021-12-25 DIAGNOSIS — Z20822 Contact with and (suspected) exposure to covid-19: Secondary | ICD-10-CM | POA: Diagnosis present

## 2021-12-25 DIAGNOSIS — K50119 Crohn's disease of large intestine with unspecified complications: Secondary | ICD-10-CM | POA: Diagnosis not present

## 2021-12-25 DIAGNOSIS — E876 Hypokalemia: Secondary | ICD-10-CM | POA: Diagnosis present

## 2021-12-25 DIAGNOSIS — Z6833 Body mass index (BMI) 33.0-33.9, adult: Secondary | ICD-10-CM | POA: Diagnosis not present

## 2021-12-25 DIAGNOSIS — Z86718 Personal history of other venous thrombosis and embolism: Secondary | ICD-10-CM | POA: Diagnosis not present

## 2021-12-25 DIAGNOSIS — Z9049 Acquired absence of other specified parts of digestive tract: Secondary | ICD-10-CM | POA: Diagnosis not present

## 2021-12-25 DIAGNOSIS — Z880 Allergy status to penicillin: Secondary | ICD-10-CM | POA: Diagnosis not present

## 2021-12-25 DIAGNOSIS — K567 Ileus, unspecified: Secondary | ICD-10-CM | POA: Diagnosis present

## 2021-12-25 DIAGNOSIS — E669 Obesity, unspecified: Secondary | ICD-10-CM | POA: Diagnosis present

## 2021-12-25 DIAGNOSIS — Z7901 Long term (current) use of anticoagulants: Secondary | ICD-10-CM | POA: Diagnosis not present

## 2021-12-25 DIAGNOSIS — Z79620 Long term (current) use of immunosuppressive biologic: Secondary | ICD-10-CM | POA: Diagnosis not present

## 2021-12-25 DIAGNOSIS — K219 Gastro-esophageal reflux disease without esophagitis: Secondary | ICD-10-CM | POA: Diagnosis present

## 2021-12-25 DIAGNOSIS — F17210 Nicotine dependence, cigarettes, uncomplicated: Secondary | ICD-10-CM | POA: Diagnosis present

## 2021-12-25 DIAGNOSIS — Z91041 Radiographic dye allergy status: Secondary | ICD-10-CM | POA: Diagnosis not present

## 2021-12-25 DIAGNOSIS — Z79899 Other long term (current) drug therapy: Secondary | ICD-10-CM | POA: Diagnosis not present

## 2021-12-25 DIAGNOSIS — I10 Essential (primary) hypertension: Secondary | ICD-10-CM | POA: Diagnosis present

## 2021-12-25 DIAGNOSIS — K5080 Crohn's disease of both small and large intestine without complications: Secondary | ICD-10-CM | POA: Diagnosis present

## 2021-12-25 LAB — BASIC METABOLIC PANEL
Anion gap: 6 (ref 5–15)
BUN: 15 mg/dL (ref 8–23)
CO2: 26 mmol/L (ref 22–32)
Calcium: 8.2 mg/dL — ABNORMAL LOW (ref 8.9–10.3)
Chloride: 109 mmol/L (ref 98–111)
Creatinine, Ser: 0.82 mg/dL (ref 0.44–1.00)
GFR, Estimated: >60 mL/min (ref >60)
Glucose, Bld: 96 mg/dL (ref 70–99)
Potassium: 4.2 mmol/L (ref 3.5–5.1)
Sodium: 141 mmol/L (ref 135–145)

## 2021-12-25 LAB — CBC
HCT: 38.7 % (ref 36.0–46.0)
Hemoglobin: 13 g/dL (ref 12.0–15.0)
MCH: 38.2 pg — ABNORMAL HIGH (ref 26.0–34.0)
MCHC: 33.6 g/dL (ref 30.0–36.0)
MCV: 113.8 fL — ABNORMAL HIGH (ref 80.0–100.0)
Platelets: 270 10*3/uL (ref 150–400)
RBC: 3.4 MIL/uL — ABNORMAL LOW (ref 3.87–5.11)
RDW: 14.8 % (ref 11.5–15.5)
WBC: 9.3 10*3/uL (ref 4.0–10.5)
nRBC: 0 % (ref 0.0–0.2)

## 2021-12-25 LAB — MAGNESIUM: Magnesium: 2.1 mg/dL (ref 1.7–2.4)

## 2021-12-25 MED ORDER — POTASSIUM CHLORIDE CRYS ER 10 MEQ PO TBCR
10.0000 meq | EXTENDED_RELEASE_TABLET | Freq: Two times a day (BID) | ORAL | Status: DC
  Administered 2021-12-25 – 2021-12-26 (×4): 10 meq via ORAL
  Filled 2021-12-25 (×5): qty 1

## 2021-12-25 MED ORDER — PANTOPRAZOLE SODIUM 40 MG PO TBEC
40.0000 mg | DELAYED_RELEASE_TABLET | Freq: Every day | ORAL | Status: DC
  Administered 2021-12-25 – 2021-12-26 (×2): 40 mg via ORAL
  Filled 2021-12-25 (×3): qty 1

## 2021-12-25 MED ORDER — PROMETHAZINE HCL 25 MG/ML IJ SOLN
INTRAMUSCULAR | Status: AC
  Filled 2021-12-25: qty 1

## 2021-12-25 MED ORDER — BISACODYL 10 MG RE SUPP
10.0000 mg | Freq: Once | RECTAL | Status: AC
  Administered 2021-12-25: 10 mg via RECTAL
  Filled 2021-12-25: qty 1

## 2021-12-25 MED ORDER — METHYLPREDNISOLONE SODIUM SUCC 40 MG IJ SOLR: 30.0000 mg | Freq: Every day | INTRAMUSCULAR | Status: DC

## 2021-12-25 MED ORDER — ENOXAPARIN SODIUM 100 MG/ML IJ SOSY
100.0000 mg | PREFILLED_SYRINGE | Freq: Two times a day (BID) | INTRAMUSCULAR | Status: DC
  Administered 2021-12-25 – 2021-12-26 (×4): 100 mg via SUBCUTANEOUS
  Filled 2021-12-25 (×5): qty 1

## 2021-12-25 MED ORDER — FLUTICASONE PROPIONATE 50 MCG/ACT NA SUSP: 2.0000 | Freq: Every day | NASAL | Status: DC | PRN

## 2021-12-25 NOTE — Progress Notes (Signed)
ANTICOAGULATION CONSULT NOTE - Initial Consult  Pharmacy Consult for lovenox Indication:  hx DVT  Allergies  Allergen Reactions   Contrast Media [Iodinated Contrast Media] Hives and Rash   Penicillins Hives and Rash             Patient Measurements: Height: 5' 7"  (170.2 cm) Weight: 99.8 kg (220 lb) IBW/kg (Calculated) : 61.6 Heparin Dosing Weight:   Vital Signs: Temp: 98.5 F (36.9 C) (01/13 0535) Temp Source: Oral (01/13 0218) BP: 146/81 (01/13 0535) Pulse Rate: 57 (01/13 0535)  Labs: Recent Labs    12/24/21 1351 12/25/21 0433  HGB 12.7 13.0  HCT 37.2 38.7  PLT 245 270  CREATININE 0.85 0.82    Estimated Creatinine Clearance: 86.4 mL/min (by C-G formula based on SCr of 0.82 mg/dL).   Medical History: Past Medical History:  Diagnosis Date   Bronchitis    C. difficile diarrhea    Cough 01/28/2014   upper airway cough syndrome   Crohn's disease ([place])    Diverticulitis    DVT 06/2020   history of   Elevated transaminase level    GERD (gastroesophageal reflux disease)    Hypertension    Obesity    Pneumonia 12/2013   Tobacco abuse     Assessment: Pharmacy consulted to dose lovenox in patient with history of DVT.  Patient is on Xarelto prior ot admission with last dose given 1/11 1730.    Goal of Therapy:   Monitor platelets by anticoagulation protocol: Yes   Plan:  Lovenox 100 mg subq every 12 hours. Monitor H&H and s/s of bleeding.  Margot Ables, PharmD Clinical Pharmacist 12/25/2021 7:54 AM

## 2021-12-25 NOTE — Progress Notes (Signed)
°  Transition of Care (TOC) Screening Note   Patient Details  Name: JEFFIE WIDDOWSON Date of Birth: [birth year]   Transition of Care [facility]) CM/SW Contact:    Iona Beard, [place] Phone Number: 12/25/2021, 10:20 AM    Transition of Care Department [facility] has reviewed patient and no TOC needs have been identified at this time. We will continue to monitor patient advancement through interdisciplinary progression rounds. If new patient transition needs arise, please place a TOC consult.

## 2021-12-25 NOTE — Progress Notes (Signed)
Patient alert and oriented. Patient was upset this morning due to not knowing plan of care. MD addressed patient's concerns at bedside. Patient was medicated with PRN medications for pain and nausea. Patient was changed from NPO to clear diet and patient is tolerating liquids well, No other complaints at this time.

## 2021-12-25 NOTE — Progress Notes (Addendum)
Subjective: Continues to have intermittent cramping abdominal pain. Admits to mild nausea but feels this is secondary to not eating. No vomiting. Hasn't had a BM and isn't passing flatus. Reports she has been up walking around the room.   Objective: Vital signs in last 24 hours: Temp:  [97.6 F (36.4 C)-99.6 F (37.6 C)] 98.5 F (36.9 C) (01/13 0535) Pulse Rate:  [48-86] 57 (01/13 0535) Resp:  [15-24] 18 (01/12 2025) BP: (117-154)/(68-87) 146/81 (01/13 0535) SpO2:  [92 %-98 %] 94 % (01/13 0535) Weight:  [96.2 kg-100 kg] 99.8 kg (01/12 1700) Last BM Date: 12/24/21 General:   Alert and oriented, pleasant, NAD. Head:  Normocephalic and atraumatic. Eyes:  No icterus, sclera clear. Conjuctiva pink.  Abdomen:  Hypoactive bowel sounds present, high pitched across the upper abdomen. Abdomen appears mildly distended and is tympanic across the upper abdomen to percussion with increased firmness. Lower abdomen is more soft. No appreciable TTP. No rebound or guarding. No masses appreciated. Extremities:  With trace edema. Neurologic:  Alert and  oriented x4;  grossly normal neurologically. Psych:  Normal mood and affect.  Intake/Output from previous day: 01/12 0701 - 01/13 0700 In: 1017 [I.V.:350.5; IV Piggyback:1096.5] Out: -  Intake/Output this shift: No intake/output data recorded.  Lab Results: Recent Labs    12/24/21 1351 12/25/21 0433  WBC 8.9 9.3  HGB 12.7 13.0  HCT 37.2 38.7  PLT 245 270   BMET Recent Labs    12/24/21 1351 12/25/21 0433  NA 140 141  K 3.2* 4.2  CL 108 109  CO2 25 26  GLUCOSE 102* 96  BUN 16 15  CREATININE 0.85 0.82  CALCIUM 8.4* 8.2*   LFT Recent Labs    12/24/21 1351  PROT 5.7*  ALBUMIN 3.1*  AST 57*  ALT 34  ALKPHOS 54  BILITOT 0.7   Studies/Results: CT ABDOMEN PELVIS WO CONTRAST  Result Date: 12/24/2021 CLINICAL DATA:  Acute, non localized lower abdominal pain. History of Crohn's disease with bowel resection. Recently diagnosed  colitis. Constipation and nausea. Contrast allergy. EXAM: CT ABDOMEN AND PELVIS WITHOUT CONTRAST TECHNIQUE: Multidetector CT imaging of the abdomen and pelvis was performed following the standard protocol without IV contrast. RADIATION DOSE REDUCTION: This exam was performed according to the departmental dose-optimization program which includes automated exposure control, adjustment of the mA and/or kV according to patient size and/or use of iterative reconstruction technique. COMPARISON:  12/21/2021 FINDINGS: Lower chest: Mild bilateral dependent atelectasis. Stable right lower lobe subpleural linear and somewhat rounded atelectasis/scarring. Hepatobiliary: The gallbladder is less distended and somewhat higher in density, suggesting sludge. Unremarkable liver. 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 Spleen: Normal in size without focal abnormality. Adrenals/Urinary Tract: Adrenal glands are unremarkable. Kidneys are normal, without renal calculi, focal lesion, or hydronephrosis. Bladder is unremarkable. Stomach/Bowel: Unremarkable stomach and small bowel. A right colon suture line is again demonstrated without visualization of the appendix. There is also a distal sigmoid colon anastomosis. Mildly progressive dilatation of the right and transverse colon, containing gas and fluid. Vascular/Lymphatic: Atheromatous arterial calcifications without aneurysm. No enlarged lymph nodes. Reproductive: Status post hysterectomy. No adnexal masses. Other: No significant change in a midline surgical scar containing a small oval fluid collection and adjacent soft tissue stranding in the subcutaneous fat. Musculoskeletal: Mild lumbar and lower thoracic spine degenerative changes. IMPRESSION: 1. Mildly progressive proximal transverse colon ileus no findings to suggest obstruction. 2. Possible sludge in the gallbladder. Electronically  Signed    By: Claudie Revering M.D.   On: 12/24/2021 15:13    Assessment: 61 y.o. year old female with history of small and large bowel Crohn's disease who is status post right hemicolectomy (November 2005 at [facility]) and history of sigmoid diverticulitis status post sigmoid colon resection in January 2016 at [facility], on Humira and Pentasa,  recently admitted (discharged on 1/10) with colitis vs ileus on CT. She presented with  acute abdominal pain LLQ and radiating across lower abdomen, constipation, associated chills but no fever, and vomiting. She received IV steroids, empiric antibiotics, and suppository with clinical improvement and was discharged home to complete her course of steroids and antibiotics. She had follow-up in the office on 1/12 and wasn't feeling any better, complained of nausea, severe intermittent abdominal pain on the left side and spreading laterally, obstipation, and not passing flatus. CT in the ED yesterday with mildly progressive proximal transverse colon ileus no findings to suggest obstruction. Also with mild hypokalemia and hypomagnesemia, now corrected.   Clinically, patient continues with ileus.  She has not passing any gas, has not had a bowel movement, and continues with intermittent nausea without vomiting.  She is currently receiving clear liquids as tolerated and request to continue this.  Etiology of her presentation with ileus is not entirely clear. 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  Recent CRP during last hospitalization on 1/9 was mildly elevated at 1.7.  Query whether underlying inflammation from Crohn's disease may be contributing.  We will plan to resume her steroid taper, give bisacodyl suppository today, and continue to treat supportively.  She had Humira trough antibody levels drawn during her last hospitalization  which are still pending.  We will need to follow-up on this.  Plan: Okay to have sips of clears as tolerated. Resume steroid taper. Will start IV methylprednisolone 30 mg daily with plans to decrease by 10 mg every 5 days.  Okay to transition to p.o. route once tolerating a diet. Bisacodyl 10 mg suppository today. Continue simethicone. Encourage ambulation and frequent turning in hospital bed. Continue analgesics antiemetics as needed per hospitalist.   LOS: 0 days    12/25/2021, 10:47 AM   Aliene Altes, PA-C [facility]

## 2021-12-25 NOTE — Progress Notes (Signed)
PROGRESS NOTE    Elizabeth [name]  IEP:[id number] DOB: Mar 08, 1961 DOA: 12/24/2021 PCP: Caryl Bis, MD   Brief Narrative:   Elizabeth [name] is a 61 y.o. female with medical history significant for Crohn's, hypertension.  Patient presented to the ED with complaints of diffuse abdominal cramping that started since she was discharged from the hospital. Patient was recently hospitalized 1/9-1/10 for suspected Crohn's colitis.  She has been readmitted with findings of ileus, but no obstruction on CT scan.  GI consultation pending.  Assessment & Plan:   Principal Problem:   Ileus ([place]) Active Problems:   HTN (hypertension)   Crohn's colitis ([place])   Ileus with Crohn's colitis -Started on clear liquid diet -Simethicone per GI recommendations -Dilaudid as needed -DC IV fluid -Appreciate GI recommendations -Encouraged ambulation  Hypertension-stable -Stop IV fluid -As needed hydralazine -Hold valsartan/HCTZ for now  DVT -Full dose Lovenox and hold Xarelto for now  Obesity -Lifestyle changes outpatient   DVT prophylaxis: Full dose Lovenox Code Status: Full Family Communication: None at bedside Disposition Plan:  Status is: Observation  The patient will require care spanning > 2 midnights and should be moved to inpatient because: IV medications.  Consultants:  GI  Procedures:  See below  Antimicrobials:  None   Subjective: Patient seen and evaluated today with ongoing abdominal cramping pain.  She denies any nausea or vomiting and is eager to try and have something to eat.  Objective: Vitals:   12/24/21 1700 12/24/21 2025 12/25/21 0218 12/25/21 0535  BP: (!) 144/78 133/79 (!) 154/87 (!) 146/81  Pulse: 64 (!) 50 (!) 48 (!) 57  Resp: 18 18    Temp:  98 F (36.7 C) 97.8 F (36.6 C) 98.5 F (36.9 C)  TempSrc:  Oral Oral   SpO2: 98% 96% 95% 94%  Weight: 99.8 kg     Height: 5' 7"  (1.702 m)       Intake/Output Summary (Last 24 hours) at 12/25/2021 1205 Last data  filed at 12/25/2021 0900 Gross per 24 hour  Intake 1447 ml  Output --  Net 1447 ml   Filed Weights   12/24/21 1317 12/24/21 1700  Weight: 96.2 kg 99.8 kg    Examination:  General exam: Appears calm and comfortable  Respiratory system: Clear to auscultation. Respiratory effort normal. Cardiovascular system: S1 & S2 heard, RRR.  Gastrointestinal system: Abdomen is soft Central nervous system: Alert and awake Extremities: No edema Skin: No significant lesions noted Psychiatry: Flat affect.    Data Reviewed: I have personally reviewed following labs and imaging studies  CBC: Recent Labs  Lab 12/21/21 0035 12/22/21 0420 12/24/21 1351 12/25/21 0433  WBC 12.6* 16.0* 8.9 9.3  NEUTROABS 11.0* 15.1* 7.4  --   HGB 15.1* 13.2 12.7 13.0  HCT 41.7 36.5 37.2 38.7  MCV 106.1* 109.9* 108.8* 113.8*  PLT 292 258 245 660   Basic Metabolic Panel: Recent Labs  Lab 12/21/21 0035 12/21/21 1240 12/22/21 0420 12/24/21 1351 12/25/21 0433  NA 137  --  136 140 141  K 2.6* 4.2 3.7 3.2* 4.2  CL 105  --  107 108 109  CO2 22  --  22 25 26   GLUCOSE 133*  --  148* 102* 96  BUN 23  --  21 16 15   CREATININE 0.80  --  0.73 0.85 0.82  CALCIUM 7.8*  --  7.7* 8.4* 8.2*  MG 1.2* 1.8 1.6* 1.6* 2.1   GFR: Estimated Creatinine Clearance: 86.4 mL/min (by C-G formula  based on SCr of 0.82 mg/dL). Liver Function Tests: Recent Labs  Lab 12/21/21 0035 12/22/21 0420 12/24/21 1351  AST 21 14* 57*  ALT 15 14 34  ALKPHOS 58 49 54  BILITOT 1.3* 0.4 0.7  PROT 6.1* 5.5* 5.7*  ALBUMIN 3.2* 2.9* 3.1*   No results for input(s): LIPASE, AMYLASE in the last 168 hours. No results for input(s): AMMONIA in the last 168 hours. Coagulation Profile: No results for input(s): INR, PROTIME in the last 168 hours. Cardiac Enzymes: No results for input(s): CKTOTAL, CKMB, CKMBINDEX, TROPONINI in the last 168 hours. BNP (last 3 results) No results for input(s): PROBNP in the last 8760 hours. HbA1C: No results  for input(s): HGBA1C in the last 72 hours. CBG: No results for input(s): GLUCAP in the last 168 hours. Lipid Profile: No results for input(s): CHOL, HDL, LDLCALC, TRIG, CHOLHDL, LDLDIRECT in the last 72 hours. Thyroid Function Tests: No results for input(s): TSH, T4TOTAL, FREET4, T3FREE, THYROIDAB in the last 72 hours. Anemia Panel: No results for input(s): VITAMINB12, FOLATE, FERRITIN, TIBC, IRON, RETICCTPCT in the last 72 hours. Sepsis Labs: Recent Labs  Lab 12/21/21 0035 12/21/21 0309  LATICACIDVEN 1.4 1.2    Recent Results (from the past 240 hour(s))  Resp Panel by RT-PCR (Flu A&B, Covid) Nasopharyngeal Swab     Status: None   Collection Time: 12/21/21  5:35 AM   Specimen: Nasopharyngeal Swab; Nasopharyngeal(NP) swabs in vial transport medium  Result Value Ref Range Status   SARS Coronavirus 2 by RT PCR NEGATIVE NEGATIVE Final    Comment: (NOTE) SARS-CoV-2 target nucleic acids are NOT DETECTED.  The SARS-CoV-2 RNA is generally detectable in upper respiratory specimens during the acute phase of infection. The lowest concentration of SARS-CoV-2 viral copies this assay can detect is 138 copies/mL. A negative result does not preclude SARS-Cov-2 infection and should not be used as the sole basis for treatment or other patient management decisions. A negative result may occur with  improper specimen collection/handling, submission of specimen other than nasopharyngeal swab, presence of viral mutation(s) within the areas targeted by this assay, and inadequate number of viral copies(<138 copies/mL). A negative result must be combined with clinical observations, patient history, and epidemiological information. The expected result is Negative.  Fact Sheet for Patients:  EntrepreneurPulse.com.au  Fact Sheet for Healthcare Providers:  IncredibleEmployment.be  This test is no t yet approved or cleared by the [place] FDA and  has been  authorized for detection and/or diagnosis of SARS-CoV-2 by FDA under an Emergency Use Authorization (EUA). This EUA will remain  in effect (meaning this test can be used) for the duration of the COVID-19 declaration under Section 564(b)(1) of the Act, 21 U.S.C.section 360bbb-3(b)(1), unless the authorization is terminated  or revoked sooner.       Influenza A by PCR NEGATIVE NEGATIVE Final   Influenza B by PCR NEGATIVE NEGATIVE Final    Comment: (NOTE) The Xpert Xpress SARS-CoV-2/FLU/RSV plus assay is intended as an aid in the diagnosis of influenza from Nasopharyngeal swab specimens and should not be used as a sole basis for treatment. Nasal washings and aspirates are unacceptable for Xpert Xpress SARS-CoV-2/FLU/RSV testing.  Fact Sheet for Patients: EntrepreneurPulse.com.au  Fact Sheet for Healthcare Providers: IncredibleEmployment.be  This test is not yet approved or cleared by the [place] FDA and has been authorized for detection and/or diagnosis of SARS-CoV-2 by FDA under an Emergency Use Authorization (EUA). This EUA will remain in effect (meaning this test can be used) for  the duration of the COVID-19 declaration under Section 564(b)(1) of the Act, 21 U.S.C. section 360bbb-3(b)(1), unless the authorization is terminated or revoked.  Performed at [facility], [address], [place], [place] [address]   MRSA Next Gen by PCR, Nasal     Status: None   Collection Time: 12/21/21  8:34 AM   Specimen: Nasal Mucosa; Nasal Swab  Result Value Ref Range Status   MRSA by PCR Next Gen NOT DETECTED NOT DETECTED Final    Comment: (NOTE) The GeneXpert MRSA Assay (FDA approved for NASAL specimens only), is one component of a comprehensive MRSA colonization surveillance program. It is not intended to diagnose MRSA infection nor to guide or monitor treatment for MRSA infections. Test performance is not FDA approved in patients less than 61  years old. Performed at [facility], [address] [place], [place] [address]   Resp Panel by RT-PCR (Flu A&B, Covid) Nasopharyngeal Swab     Status: None   Collection Time: 12/24/21  3:56 PM   Specimen: Nasopharyngeal Swab; Nasopharyngeal(NP) swabs in vial transport medium  Result Value Ref Range Status   SARS Coronavirus 2 by RT PCR NEGATIVE NEGATIVE Final    Comment: (NOTE) SARS-CoV-2 target nucleic acids are NOT DETECTED.  The SARS-CoV-2 RNA is generally detectable in upper respiratory specimens during the acute phase of infection. The lowest concentration of SARS-CoV-2 viral copies this assay can detect is 138 copies/mL. A negative result does not preclude SARS-Cov-2 infection and should not be used as the sole basis for treatment or other patient management decisions. A negative result may occur with  improper specimen collection/handling, submission of specimen other than nasopharyngeal swab, presence of viral mutation(s) within the areas targeted by this assay, and inadequate number of viral copies(<138 copies/mL). A negative result must be combined with clinical observations, patient history, and epidemiological information. The expected result is Negative.  Fact Sheet for Patients:  EntrepreneurPulse.com.au  Fact Sheet for Healthcare Providers:  IncredibleEmployment.be  This test is no t yet approved or cleared by the [place] FDA and  has been authorized for detection and/or diagnosis of SARS-CoV-2 by FDA under an Emergency Use Authorization (EUA). This EUA will remain  in effect (meaning this test can be used) for the duration of the COVID-19 declaration under Section 564(b)(1) of the Act, 21 U.S.C.section 360bbb-3(b)(1), unless the authorization is terminated  or revoked sooner.       Influenza A by PCR NEGATIVE NEGATIVE Final   Influenza B by PCR NEGATIVE NEGATIVE Final    Comment: (NOTE) The Xpert Xpress  SARS-CoV-2/FLU/RSV plus assay is intended as an aid in the diagnosis of influenza from Nasopharyngeal swab specimens and should not be used as a sole basis for treatment. Nasal washings and aspirates are unacceptable for Xpert Xpress SARS-CoV-2/FLU/RSV testing.  Fact Sheet for Patients: EntrepreneurPulse.com.au  Fact Sheet for Healthcare Providers: IncredibleEmployment.be  This test is not yet approved or cleared by the [place] FDA and has been authorized for detection and/or diagnosis of SARS-CoV-2 by FDA under an Emergency Use Authorization (EUA). This EUA will remain in effect (meaning this test can be used) for the duration of the COVID-19 declaration under Section 564(b)(1) of the Act, 21 U.S.C. section 360bbb-3(b)(1), unless the authorization is terminated or revoked.  Performed at [facility], [address] [place], [place] [address]          Radiology Studies: CT ABDOMEN PELVIS WO CONTRAST  Result Date: 12/24/2021 CLINICAL DATA:  Acute, non localized lower  abdominal pain. History of Crohn's disease with bowel resection. Recently diagnosed colitis. Constipation and nausea. Contrast allergy. EXAM: CT ABDOMEN AND PELVIS WITHOUT CONTRAST TECHNIQUE: Multidetector CT imaging of the abdomen and pelvis was performed following the standard protocol without IV contrast. RADIATION DOSE REDUCTION: This exam was performed according to the departmental dose-optimization program which includes automated exposure control, adjustment of the mA and/or kV according to patient size and/or use of iterative reconstruction technique. COMPARISON:  12/21/2021 FINDINGS: Lower chest: Mild bilateral dependent atelectasis. Stable right lower lobe subpleural linear and somewhat rounded atelectasis/scarring. Hepatobiliary: The gallbladder is less distended and somewhat higher in density, suggesting sludge. Unremarkable liver. 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 Spleen: Normal in size without focal abnormality. Adrenals/Urinary Tract: Adrenal glands are unremarkable. Kidneys are normal, without renal calculi, focal lesion, or hydronephrosis. Bladder is unremarkable. Stomach/Bowel: Unremarkable stomach and small bowel. A right colon suture line is again demonstrated without visualization of the appendix. There is also a distal sigmoid colon anastomosis. Mildly progressive dilatation of the right and transverse colon, containing gas and fluid. Vascular/Lymphatic: Atheromatous arterial calcifications without aneurysm. No enlarged lymph nodes. Reproductive: Status post hysterectomy. No adnexal masses. Other: No significant change in a midline surgical scar containing a small oval fluid collection and adjacent soft tissue stranding in the subcutaneous fat. Musculoskeletal: Mild lumbar and lower thoracic spine degenerative changes. IMPRESSION: 1. Mildly progressive proximal transverse colon ileus no findings to suggest obstruction. 2. Possible sludge in the gallbladder. Electronically Signed   By: Claudie Revering M.D.   On: 12/24/2021 15:13        Scheduled Meds:  enoxaparin (LOVENOX) injection  100 mg Subcutaneous Q12H   pantoprazole  40 mg Oral Daily   potassium chloride  10 mEq Oral BID   Continuous Infusions:  promethazine (PHENERGAN) injection (IM or IVPB) 12.5 mg (12/25/21 0916)     LOS: 0 days    Time spent: 35 minutes    Ivannah Zody Darleen Crocker, DO [facility]  If 7PM-7AM, please contact night-coverage [email or web address] 12/25/2021, 12:05 PM

## 2021-12-26 DIAGNOSIS — K567 Ileus, unspecified: Secondary | ICD-10-CM | POA: Diagnosis not present

## 2021-12-26 LAB — COMPREHENSIVE METABOLIC PANEL
ALT: 40 U/L (ref 0–44)
AST: 52 U/L — ABNORMAL HIGH (ref 15–41)
Albumin: 2.9 g/dL — ABNORMAL LOW (ref 3.5–5.0)
Alkaline Phosphatase: 51 U/L (ref 38–126)
Anion gap: 8 (ref 5–15)
BUN: 11 mg/dL (ref 8–23)
CO2: 24 mmol/L (ref 22–32)
Calcium: 8.3 mg/dL — ABNORMAL LOW (ref 8.9–10.3)
Chloride: 108 mmol/L (ref 98–111)
Creatinine, Ser: 0.88 mg/dL (ref 0.44–1.00)
GFR, Estimated: >60 mL/min (ref >60)
Glucose, Bld: 80 mg/dL (ref 70–99)
Potassium: 3.6 mmol/L (ref 3.5–5.1)
Sodium: 140 mmol/L (ref 135–145)
Total Bilirubin: 0.8 mg/dL (ref 0.3–1.2)
Total Protein: 5.5 g/dL — ABNORMAL LOW (ref 6.5–8.1)

## 2021-12-26 LAB — CBC
HCT: 37.6 % (ref 36.0–46.0)
Hemoglobin: 12.9 g/dL (ref 12.0–15.0)
MCH: 39 pg — ABNORMAL HIGH (ref 26.0–34.0)
MCHC: 34.3 g/dL (ref 30.0–36.0)
MCV: 113.6 fL — ABNORMAL HIGH (ref 80.0–100.0)
Platelets: 247 10*3/uL (ref 150–400)
RBC: 3.31 MIL/uL — ABNORMAL LOW (ref 3.87–5.11)
RDW: 14.4 % (ref 11.5–15.5)
WBC: 6.8 10*3/uL (ref 4.0–10.5)
nRBC: 0 % (ref 0.0–0.2)

## 2021-12-26 LAB — MAGNESIUM: Magnesium: 1.8 mg/dL (ref 1.7–2.4)

## 2021-12-26 MED ORDER — METHYLPREDNISOLONE SODIUM SUCC 40 MG IJ SOLR
30.0000 mg | Freq: Every day | INTRAMUSCULAR | Status: DC
  Administered 2021-12-26 – 2021-12-27 (×2): 30 mg via INTRAVENOUS
  Filled 2021-12-26 (×2): qty 1

## 2021-12-26 MED ORDER — PROMETHAZINE HCL 12.5 MG PO TABS
12.5000 mg | ORAL_TABLET | Freq: Four times a day (QID) | ORAL | Status: DC | PRN
  Administered 2021-12-26 – 2021-12-27 (×4): 12.5 mg via ORAL
  Filled 2021-12-26 (×4): qty 1

## 2021-12-26 NOTE — Progress Notes (Signed)
PROGRESS NOTE    Elizabeth [name]  VEH:[id number] DOB: 04-18-1961 DOA: 12/24/2021 PCP: Caryl Bis, MD   Brief Narrative:   Elizabeth [name] is a 61 y.o. female with medical history significant for Crohn's, hypertension.  Patient presented to the ED with complaints of diffuse abdominal cramping that started since she was discharged from the hospital. Patient was recently hospitalized 1/9-1/10 for suspected Crohn's colitis.  She has been readmitted with findings of ileus, but no obstruction on CT scan.  GI consultation appreciated and patient has been resumed on steroid taper.  Diet being advanced.  Assessment & Plan:   Principal Problem:   Ileus ([place]) Active Problems:   HTN (hypertension)   Crohn's colitis ([place])   Ileus with Crohn's colitis -Advance to soft diet -Simethicone per GI recommendations -Dilaudid as needed -Appreciate GI recommendations with initiation of steroid taper 1/14 -Encouraged ambulation   Hypertension-stable -As needed hydralazine -Hold valsartan/HCTZ for now   DVT -Full dose Lovenox and hold Xarelto for now   Obesity -Lifestyle changes outpatient     DVT prophylaxis: Full dose Lovenox Code Status: Full Family Communication: None at bedside Disposition Plan:  Status is: Inpatient  Remains inpatient appropriate because: Need for close monitoring    Consultants:  GI   Procedures:  See below   Antimicrobials:  None  Subjective: Patient seen and evaluated today with improvements in abdominal pain noted, she has been ambulating in the room.  She is feeling hungry and diet has been advanced to soft.  Objective: Vitals:   12/25/21 0535 12/25/21 1435 12/25/21 2100 12/26/21 0500  BP: (!) 146/81 (!) 141/83 135/74 (!) 148/79  Pulse: (!) 57 (!) 49 (!) 55 73  Resp:  19 16 16   Temp: 98.5 F (36.9 C) 97.6 F (36.4 C) 97.6 F (36.4 C) 98.2 F (36.8 C)  TempSrc:  Oral Oral Oral  SpO2: 94% 99% 97% 95%  Weight:      Height:         Intake/Output Summary (Last 24 hours) at 12/26/2021 1001 Last data filed at 12/26/2021 0800 Gross per 24 hour  Intake 960 ml  Output --  Net 960 ml   Filed Weights   12/24/21 1317 12/24/21 1700  Weight: 96.2 kg 99.8 kg    Examination:  General exam: Appears calm and comfortable  Respiratory system: Clear to auscultation. Respiratory effort normal. Cardiovascular system: S1 & S2 heard, RRR.  Gastrointestinal system: Abdomen is soft Central nervous system: Alert and awake Extremities: No edema Skin: No significant lesions noted Psychiatry: Flat affect.    Data Reviewed: I have personally reviewed following labs and imaging studies  CBC: Recent Labs  Lab 12/21/21 0035 12/22/21 0420 12/24/21 1351 12/25/21 0433 12/26/21 0405  WBC 12.6* 16.0* 8.9 9.3 6.8  NEUTROABS 11.0* 15.1* 7.4  --   --   HGB 15.1* 13.2 12.7 13.0 12.9  HCT 41.7 36.5 37.2 38.7 37.6  MCV 106.1* 109.9* 108.8* 113.8* 113.6*  PLT 292 258 245 270 836   Basic Metabolic Panel: Recent Labs  Lab 12/21/21 0035 12/21/21 1240 12/22/21 0420 12/24/21 1351 12/25/21 0433 12/26/21 0405  NA 137  --  136 140 141 140  K 2.6* 4.2 3.7 3.2* 4.2 3.6  CL 105  --  107 108 109 108  CO2 22  --  22 25 26 24   GLUCOSE 133*  --  148* 102* 96 80  BUN 23  --  21 16 15 11   CREATININE 0.80  --  0.73  0.85 0.82 0.88  CALCIUM 7.8*  --  7.7* 8.4* 8.2* 8.3*  MG 1.2* 1.8 1.6* 1.6* 2.1 1.8   GFR: Estimated Creatinine Clearance: 80.5 mL/min (by C-G formula based on SCr of 0.88 mg/dL). Liver Function Tests: Recent Labs  Lab 12/21/21 0035 12/22/21 0420 12/24/21 1351 12/26/21 0405  AST 21 14* 57* 52*  ALT 15 14 34 40  ALKPHOS 58 49 54 51  BILITOT 1.3* 0.4 0.7 0.8  PROT 6.1* 5.5* 5.7* 5.5*  ALBUMIN 3.2* 2.9* 3.1* 2.9*   No results for input(s): LIPASE, AMYLASE in the last 168 hours. No results for input(s): AMMONIA in the last 168 hours. Coagulation Profile: No results for input(s): INR, PROTIME in the last 168  hours. Cardiac Enzymes: No results for input(s): CKTOTAL, CKMB, CKMBINDEX, TROPONINI in the last 168 hours. BNP (last 3 results) No results for input(s): PROBNP in the last 8760 hours. HbA1C: No results for input(s): HGBA1C in the last 72 hours. CBG: No results for input(s): GLUCAP in the last 168 hours. Lipid Profile: No results for input(s): CHOL, HDL, LDLCALC, TRIG, CHOLHDL, LDLDIRECT in the last 72 hours. Thyroid Function Tests: No results for input(s): TSH, T4TOTAL, FREET4, T3FREE, THYROIDAB in the last 72 hours. Anemia Panel: No results for input(s): VITAMINB12, FOLATE, FERRITIN, TIBC, IRON, RETICCTPCT in the last 72 hours. Sepsis Labs: Recent Labs  Lab 12/21/21 0035 12/21/21 0309  LATICACIDVEN 1.4 1.2    Recent Results (from the past 240 hour(s))  Resp Panel by RT-PCR (Flu A&B, Covid) Nasopharyngeal Swab     Status: None   Collection Time: 12/21/21  5:35 AM   Specimen: Nasopharyngeal Swab; Nasopharyngeal(NP) swabs in vial transport medium  Result Value Ref Range Status   SARS Coronavirus 2 by RT PCR NEGATIVE NEGATIVE Final    Comment: (NOTE) SARS-CoV-2 target nucleic acids are NOT DETECTED.  The SARS-CoV-2 RNA is generally detectable in upper respiratory specimens during the acute phase of infection. The lowest concentration of SARS-CoV-2 viral copies this assay can detect is 138 copies/mL. A negative result does not preclude SARS-Cov-2 infection and should not be used as the sole basis for treatment or other patient management decisions. A negative result may occur with  improper specimen collection/handling, submission of specimen other than nasopharyngeal swab, presence of viral mutation(s) within the areas targeted by this assay, and inadequate number of viral copies(<138 copies/mL). A negative result must be combined with clinical observations, patient history, and epidemiological information. The expected result is Negative.  Fact Sheet for Patients:   EntrepreneurPulse.com.au  Fact Sheet for Healthcare Providers:  IncredibleEmployment.be  This test is no t yet approved or cleared by the [place] FDA and  has been authorized for detection and/or diagnosis of SARS-CoV-2 by FDA under an Emergency Use Authorization (EUA). This EUA will remain  in effect (meaning this test can be used) for the duration of the COVID-19 declaration under Section 564(b)(1) of the Act, 21 U.S.C.section 360bbb-3(b)(1), unless the authorization is terminated  or revoked sooner.       Influenza A by PCR NEGATIVE NEGATIVE Final   Influenza B by PCR NEGATIVE NEGATIVE Final    Comment: (NOTE) The Xpert Xpress SARS-CoV-2/FLU/RSV plus assay is intended as an aid in the diagnosis of influenza from Nasopharyngeal swab specimens and should not be used as a sole basis for treatment. Nasal washings and aspirates are unacceptable for Xpert Xpress SARS-CoV-2/FLU/RSV testing.  Fact Sheet for Patients: EntrepreneurPulse.com.au  Fact Sheet for Healthcare Providers: IncredibleEmployment.be  This test is not  yet approved or cleared by the [place] and has been authorized for detection and/or diagnosis of SARS-CoV-2 by FDA under an Emergency Use Authorization (EUA). This EUA will remain in effect (meaning this test can be used) for the duration of the COVID-19 declaration under Section 564(b)(1) of the Act, 21 U.S.C. section 360bbb-3(b)(1), unless the authorization is terminated or revoked.  Performed at [facility], [address], [place], [place] [address]   MRSA Next Gen by PCR, Nasal     Status: None   Collection Time: 12/21/21  8:34 AM   Specimen: Nasal Mucosa; Nasal Swab  Result Value Ref Range Status   MRSA by PCR Next Gen NOT DETECTED NOT DETECTED Final    Comment: (NOTE) The GeneXpert MRSA Assay (FDA approved for NASAL specimens only), is one component of a  comprehensive MRSA colonization surveillance program. It is not intended to diagnose MRSA infection nor to guide or monitor treatment for MRSA infections. Test performance is not FDA approved in patients less than 44 years old. Performed at [facility], [address], [place], [place] [address]   Resp Panel by RT-PCR (Flu A&B, Covid) Nasopharyngeal Swab     Status: None   Collection Time: 12/24/21  3:56 PM   Specimen: Nasopharyngeal Swab; Nasopharyngeal(NP) swabs in vial transport medium  Result Value Ref Range Status   SARS Coronavirus 2 by RT PCR NEGATIVE NEGATIVE Final    Comment: (NOTE) SARS-CoV-2 target nucleic acids are NOT DETECTED.  The SARS-CoV-2 RNA is generally detectable in upper respiratory specimens during the acute phase of infection. The lowest concentration of SARS-CoV-2 viral copies this assay can detect is 138 copies/mL. A negative result does not preclude SARS-Cov-2 infection and should not be used as the sole basis for treatment or other patient management decisions. A negative result may occur with  improper specimen collection/handling, submission of specimen other than nasopharyngeal swab, presence of viral mutation(s) within the areas targeted by this assay, and inadequate number of viral copies(<138 copies/mL). A negative result must be combined with clinical observations, patient history, and epidemiological information. The expected result is Negative.  Fact Sheet for Patients:  EntrepreneurPulse.com.au  Fact Sheet for Healthcare Providers:  IncredibleEmployment.be  This test is no t yet approved or cleared by the [place] FDA and  has been authorized for detection and/or diagnosis of SARS-CoV-2 by FDA under an Emergency Use Authorization (EUA). This EUA will remain  in effect (meaning this test can be used) for the duration of the COVID-19 declaration under Section 564(b)(1) of the Act, 21 U.S.C.section  360bbb-3(b)(1), unless the authorization is terminated  or revoked sooner.       Influenza A by PCR NEGATIVE NEGATIVE Final   Influenza B by PCR NEGATIVE NEGATIVE Final    Comment: (NOTE) The Xpert Xpress SARS-CoV-2/FLU/RSV plus assay is intended as an aid in the diagnosis of influenza from Nasopharyngeal swab specimens and should not be used as a sole basis for treatment. Nasal washings and aspirates are unacceptable for Xpert Xpress SARS-CoV-2/FLU/RSV testing.  Fact Sheet for Patients: EntrepreneurPulse.com.au  Fact Sheet for Healthcare Providers: IncredibleEmployment.be  This test is not yet approved or cleared by the [place] FDA and has been authorized for detection and/or diagnosis of SARS-CoV-2 by FDA under an Emergency Use Authorization (EUA). This EUA will remain in effect (meaning this test can be used) for the duration of the COVID-19 declaration under Section 564(b)(1) of the Act, 21 U.S.C. section 360bbb-3(b)(1), unless the authorization is terminated or  revoked.  Performed at [facility], [address], [place], [place] [address]          Radiology Studies: CT ABDOMEN PELVIS WO CONTRAST  Result Date: 12/24/2021 CLINICAL DATA:  Acute, non localized lower abdominal pain. History of Crohn's disease with bowel resection. Recently diagnosed colitis. Constipation and nausea. Contrast allergy. EXAM: CT ABDOMEN AND PELVIS WITHOUT CONTRAST TECHNIQUE: Multidetector CT imaging of the abdomen and pelvis was performed following the standard protocol without IV contrast. RADIATION DOSE REDUCTION: This exam was performed according to the departmental dose-optimization program which includes automated exposure control, adjustment of the mA and/or kV according to patient size and/or use of iterative reconstruction technique. COMPARISON:  12/21/2021 FINDINGS: Lower chest: Mild bilateral dependent atelectasis. Stable right lower lobe  subpleural linear and somewhat rounded atelectasis/scarring. Hepatobiliary: The gallbladder is less distended and somewhat higher in density, suggesting sludge. Unremarkable liver. 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 Spleen: Normal in size without focal abnormality. Adrenals/Urinary Tract: Adrenal glands are unremarkable. Kidneys are normal, without renal calculi, focal lesion, or hydronephrosis. Bladder is unremarkable. Stomach/Bowel: Unremarkable stomach and small bowel. A right colon suture line is again demonstrated without visualization of the appendix. There is also a distal sigmoid colon anastomosis. Mildly progressive dilatation of the right and transverse colon, containing gas and fluid. Vascular/Lymphatic: Atheromatous arterial calcifications without aneurysm. No enlarged lymph nodes. Reproductive: Status post hysterectomy. No adnexal masses. Other: No significant change in a midline surgical scar containing a small oval fluid collection and adjacent soft tissue stranding in the subcutaneous fat. Musculoskeletal: Mild lumbar and lower thoracic spine degenerative changes. IMPRESSION: 1. Mildly progressive proximal transverse colon ileus no findings to suggest obstruction. 2. Possible sludge in the gallbladder. Electronically Signed   By: Claudie Revering M.D.   On: 12/24/2021 15:13        Scheduled Meds:  enoxaparin (LOVENOX) injection  100 mg Subcutaneous Q12H   methylPREDNISolone (SOLU-MEDROL) injection  30 mg Intravenous Daily   pantoprazole  40 mg Oral Daily   potassium chloride  10 mEq Oral BID   Continuous Infusions:  promethazine (PHENERGAN) injection (IM or IVPB) 12.5 mg (12/26/21 0451)     LOS: 1 day    Time spent: 35 minutes    Suzannah Bettes Darleen Crocker, DO [facility]  If 7PM-7AM, please contact night-coverage [email or web address] 12/26/2021, 10:01 AM

## 2021-12-26 NOTE — Progress Notes (Signed)
When administering IV pain medication, patient complained that it was burning and painful. Iv site slightly red and tender to touch. Patient request that IV be taken out and not replaced. MD contacted for ok to leave out IV and to switch IV pain meds to PO.

## 2021-12-26 NOTE — Progress Notes (Signed)
Subjective: Patient states she feels better today.  She did receive suppository yesterday with a few small bowel movements which she states was just suppository material.  Wants to try soft diet today.  Abdominal pain continues intermittently.  Objective: Vital signs in last 24 hours: Temp:  [97.6 F (36.4 C)-98.2 F (36.8 C)] 98.2 F (36.8 C) (01/14 0500) Pulse Rate:  [49-73] 73 (01/14 0500) Resp:  [16-19] 16 (01/14 0500) BP: (135-148)/(74-83) 148/79 (01/14 0500) SpO2:  [95 %-99 %] 95 % (01/14 0500) Last BM Date: 12/24/21 General:   Alert and oriented, pleasant Head:  Normocephalic and atraumatic. Eyes:  No icterus, sclera clear. Conjuctiva pink.  Abdomen:  Bowel sounds present, soft, non-tender, non-distended. No HSM or hernias noted. No rebound or guarding. No masses appreciated  Msk:  Symmetrical without gross deformities. Normal posture. Extremities:  Without clubbing or edema. Neurologic:  Alert and  oriented x4;  grossly normal neurologically. Skin:  Warm and dry, intact without significant lesions.  Cervical Nodes:  No significant cervical adenopathy. Psych:  Alert and cooperative. Normal mood and affect.  Intake/Output from previous day: 01/13 0701 - 01/14 0700 In: 720 [P.O.:720] Out: -  Intake/Output this shift: Total I/O In: 240 [P.O.:240] Out: -   Lab Results: Recent Labs    12/24/21 1351 12/25/21 0433 12/26/21 0405  WBC 8.9 9.3 6.8  HGB 12.7 13.0 12.9  HCT 37.2 38.7 37.6  PLT 245 270 247   BMET Recent Labs    12/24/21 1351 12/25/21 0433 12/26/21 0405  NA 140 141 140  K 3.2* 4.2 3.6  CL 108 109 108  CO2 25 26 24   GLUCOSE 102* 96 80  BUN 16 15 11   CREATININE 0.85 0.82 0.88  CALCIUM 8.4* 8.2* 8.3*   LFT Recent Labs    12/24/21 1351 12/26/21 0405  PROT 5.7* 5.5*  ALBUMIN 3.1* 2.9*  AST 57* 52*  ALT 34 40  ALKPHOS 54 51  BILITOT 0.7 0.8   PT/INR No results for input(s): LABPROT, INR in the last 72 hours. Hepatitis Panel No results for  input(s): HEPBSAG, HCVAB, HEPAIGM, HEPBIGM in the last 72 hours.   Studies/Results: CT ABDOMEN PELVIS WO CONTRAST  Result Date: 12/24/2021 CLINICAL DATA:  Acute, non localized lower abdominal pain. History of Crohn's disease with bowel resection. Recently diagnosed colitis. Constipation and nausea. Contrast allergy. EXAM: CT ABDOMEN AND PELVIS WITHOUT CONTRAST TECHNIQUE: Multidetector CT imaging of the abdomen and pelvis was performed following the standard protocol without IV contrast. RADIATION DOSE REDUCTION: This exam was performed according to the departmental dose-optimization program which includes automated exposure control, adjustment of the mA and/or kV according to patient size and/or use of iterative reconstruction technique. COMPARISON:  12/21/2021 FINDINGS: Lower chest: Mild bilateral dependent atelectasis. Stable right lower lobe subpleural linear and somewhat rounded atelectasis/scarring. Hepatobiliary: The gallbladder is less distended and somewhat higher in density, suggesting sludge. Unremarkable liver. 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 Spleen: Normal in size without focal abnormality. Adrenals/Urinary Tract: Adrenal glands are unremarkable. Kidneys are normal, without renal calculi, focal lesion, or hydronephrosis. Bladder is unremarkable. Stomach/Bowel: Unremarkable stomach and small bowel. A right colon suture line is again demonstrated without visualization of the appendix. There is also a distal sigmoid colon anastomosis. Mildly progressive dilatation of the right and transverse colon, containing gas and fluid. Vascular/Lymphatic: Atheromatous arterial calcifications without aneurysm. No enlarged lymph nodes. Reproductive: Status post hysterectomy. No adnexal masses. Other: No significant change in  a midline surgical scar containing a small oval fluid collection and adjacent soft tissue  stranding in the subcutaneous fat. Musculoskeletal: Mild lumbar and lower thoracic spine degenerative changes. IMPRESSION: 1. Mildly progressive proximal transverse colon ileus no findings to suggest obstruction. 2. Possible sludge in the gallbladder. Electronically Signed   By: Claudie Revering M.D.   On: 12/24/2021 15:13    Assessment: *Colonic ileus *Crohn's colitis-improved on steroids *Abdominal pain  Plan: Patient notes clinical improvement today.  Being advanced to soft diet.  Continue supportive measures.  Continue simethicone.  Continue on steroid taper for recent Crohn's colitis flare.  Encourage ambulation and frequent turning in bed.  Recommended she use analgesics sparingly as this will delay her ileus from resolving.  To continue to follow.  Elon Alas. Abbey Chatters, D.O. Gastroenterology and Hepatology [facility]   LOS: 1 day    12/26/2021, 11:13 AM

## 2021-12-26 NOTE — Progress Notes (Signed)
Patient has been up ambulating in room. Patient still continues to complain about pain and nausea. Patient reports not bowel movement even with suppository yesterday afternoon. Patient states that she is tolerating liquids well and thinks she can tolerate soft diet that was ordered. Patient voices no concerns at this time, will continue to monitor.

## 2021-12-26 NOTE — Progress Notes (Signed)
Has requested pain medication for abd pain and abdomen is distented.  Nausea but no vomiting.  Several requests for gingerale or spirte. Has had several bms that are just small white globs, possibly leftover suppository. Hat put in toilet for future bm to save and show MD.

## 2021-12-27 DIAGNOSIS — K567 Ileus, unspecified: Secondary | ICD-10-CM | POA: Diagnosis not present

## 2021-12-27 LAB — CBC
HCT: 36.5 % (ref 36.0–46.0)
Hemoglobin: 12.6 g/dL (ref 12.0–15.0)
MCH: 38.5 pg — ABNORMAL HIGH (ref 26.0–34.0)
MCHC: 34.5 g/dL (ref 30.0–36.0)
MCV: 111.6 fL — ABNORMAL HIGH (ref 80.0–100.0)
Platelets: 256 10*3/uL (ref 150–400)
RBC: 3.27 MIL/uL — ABNORMAL LOW (ref 3.87–5.11)
RDW: 14.1 % (ref 11.5–15.5)
WBC: 10.3 10*3/uL (ref 4.0–10.5)
nRBC: 0 % (ref 0.0–0.2)

## 2021-12-27 LAB — COMPREHENSIVE METABOLIC PANEL
ALT: 60 U/L — ABNORMAL HIGH (ref 0–44)
AST: 53 U/L — ABNORMAL HIGH (ref 15–41)
Albumin: 3 g/dL — ABNORMAL LOW (ref 3.5–5.0)
Alkaline Phosphatase: 65 U/L (ref 38–126)
Anion gap: 6 (ref 5–15)
BUN: 12 mg/dL (ref 8–23)
CO2: 27 mmol/L (ref 22–32)
Calcium: 8.7 mg/dL — ABNORMAL LOW (ref 8.9–10.3)
Chloride: 105 mmol/L (ref 98–111)
Creatinine, Ser: 0.86 mg/dL (ref 0.44–1.00)
GFR, Estimated: >60 mL/min (ref >60)
Glucose, Bld: 111 mg/dL — ABNORMAL HIGH (ref 70–99)
Potassium: 4.3 mmol/L (ref 3.5–5.1)
Sodium: 138 mmol/L (ref 135–145)
Total Bilirubin: 0.7 mg/dL (ref 0.3–1.2)
Total Protein: 5.7 g/dL — ABNORMAL LOW (ref 6.5–8.1)

## 2021-12-27 LAB — MAGNESIUM: Magnesium: 1.9 mg/dL (ref 1.7–2.4)

## 2021-12-27 MED ORDER — OXYCODONE HCL 5 MG PO TABS: 5.0000 mg | ORAL_TABLET | Freq: Three times a day (TID) | ORAL | 0 refills | Status: AC | PRN

## 2021-12-27 NOTE — Discharge Summary (Signed)
Physician Discharge Summary  Elizabeth [name]:[id number] DOB: May 31, 1961 DOA: 12/24/2021  PCP: Caryl Bis, MD  Admit date: 12/24/2021  Discharge date: 12/27/2021  Admitted From:Home  Disposition:  Home  Recommendations for Outpatient Follow-up:  Follow up with PCP in 1-2 weeks Follow-up with GI which will be scheduled Continue steroid taper as prior Continue other home medications as prior  Home Health:None  Equipment/Devices:None  Discharge Condition:Stable  CODE STATUS: Full  Diet recommendation: Soft/Low-fiber  Brief/Interim Summary: Elizabeth [name] is a 61 y.o. female with medical history significant for Crohn's, hypertension.  Patient presented to the ED with complaints of diffuse abdominal cramping that started since she was discharged from the hospital. Patient was recently hospitalized 1/9-1/10 for suspected Crohn's colitis.  She has been readmitted with findings of ileus, but no obstruction on CT scan.  GI had seen and evaluated patient and had recommended slow dietary advancement as well as ambulation and IV Solu-Medrol.  She is overall doing much better with no further pain complaints and is having bowel movements and tolerating diet.  She is in stable condition for discharge today to resume her steroid taper as prior.  No other acute concerns or events noted throughout the course of this admission.  Discharge Diagnoses:  Principal Problem:   Ileus ([place]) Active Problems:   HTN (hypertension)   Crohn's colitis ([place])  Principal discharge diagnosis: Ileus in the setting of Crohn's colitis.  Discharge Instructions  Discharge Instructions     Diet - low sodium heart healthy   Complete by: As directed    Increase activity slowly   Complete by: As directed       Allergies as of 12/27/2021       Reactions   Contrast Media [iodinated Contrast Media] Hives, Rash   Penicillins Hives, Rash                Medication List     STOP taking these medications     ciprofloxacin 500 MG tablet Commonly known as: Cipro   metroNIDAZOLE 500 MG tablet Commonly known as: Flagyl       TAKE these medications    albuterol 108 (90 Base) MCG/ACT inhaler Commonly known as: VENTOLIN HFA Inhale 2 puffs into the lungs every 6 (six) hours as needed for wheezing or shortness of breath.   colestipol 1 g tablet Commonly known as: COLESTID TAKE 2 TABS ONCE DAILY X 1 WEEK, MAY INCREASE TO 2 TABS TWICE DAILY IF NEEDED.-DO NOT TAKE WITHIN 2 HRS OF OTHER MEDS   fluticasone 50 MCG/ACT nasal spray Commonly known as: FLONASE Place 2 sprays into both nostrils daily as needed for allergies or rhinitis.   furosemide 40 MG tablet Commonly known as: LASIX Take 40 mg by mouth daily as needed for edema.   Humira 40 MG/0.4ML Pskt Generic drug: Adalimumab Inject 40 mg into the skin every 14 (fourteen) days. Patient will start the maintenance Tuesday February 22,2022 What changed: Another medication with the same name was removed. Continue taking this medication, and follow the directions you see here.   mesalamine 500 MG CR capsule Commonly known as: Pentasa Take 1 capsule (500 mg total) by mouth 4 (four) times daily.   omeprazole 40 MG capsule Commonly known as: PRILOSEC Take 40 mg by mouth daily before breakfast.   ondansetron 8 MG disintegrating tablet Commonly known as: ZOFRAN-ODT TAKE 1 TABLET (8 MG TOTAL) BY MOUTH 2 (TWO) TIMES DAILY AS NEEDED.   oxyCODONE 5 MG immediate release tablet Commonly  known as: Roxicodone Take 1 tablet (5 mg total) by mouth every 8 (eight) hours as needed for up to 7 days for breakthrough pain or severe pain.   potassium chloride 10 MEQ CR capsule Commonly known as: MICRO-K Take 10 mEq by mouth 2 (two) times daily.   predniSONE 10 MG tablet Commonly known as: DELTASONE Take 4 tablets (40 mg total) by mouth daily with breakfast for 5 days, THEN 3 tablets (30 mg total) daily with breakfast for 5 days, THEN 2 tablets (20 mg  total) daily with breakfast for 5 days, THEN 1 tablet (10 mg total) daily with breakfast for 5 days. Start taking on: December 22, 2021   promethazine 25 MG tablet Commonly known as: PHENERGAN Take one bid prn What changed:  how much to take how to take this when to take this reasons to take this   rivaroxaban 20 MG Tabs tablet Commonly known as: XARELTO Take 1 tablet (20 mg total) by mouth daily with supper.   valsartan-hydrochlorothiazide 160-12.5 MG tablet Commonly known as: DIOVAN-HCT Take 1 tablet by mouth daily.        Follow-up Information     Caryl Bis, MD. Schedule an appointment as soon as possible for a visit in 1 week(s).   Specialty: Family Medicine Contact information: [place] [phone]         Eloise Harman, DO. Go to.   Specialty: Gastroenterology Contact information: [place] [address] [phone]                Allergies  Allergen Reactions   Contrast Media [Iodinated Contrast Media] Hives and Rash   Penicillins Hives and Rash             Consultations: GI   Procedures/Studies: CT ABDOMEN PELVIS WO CONTRAST  Result Date: 12/24/2021 CLINICAL DATA:  Acute, non localized lower abdominal pain. History of Crohn's disease with bowel resection. Recently diagnosed colitis. Constipation and nausea. Contrast allergy. EXAM: CT ABDOMEN AND PELVIS WITHOUT CONTRAST TECHNIQUE: Multidetector CT imaging of the abdomen and pelvis was performed following the standard protocol without IV contrast. RADIATION DOSE REDUCTION: This exam was performed according to the departmental dose-optimization program which includes automated exposure control, adjustment of the mA and/or kV according to patient size and/or use of iterative reconstruction technique. COMPARISON:  12/21/2021 FINDINGS: Lower chest: Mild bilateral dependent atelectasis. Stable right lower lobe subpleural linear and somewhat rounded  atelectasis/scarring. Hepatobiliary: The gallbladder is less distended and somewhat higher in density, suggesting sludge. Unremarkable liver. 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 Spleen: Normal in size without focal abnormality. Adrenals/Urinary Tract: Adrenal glands are unremarkable. Kidneys are normal, without renal calculi, focal lesion, or hydronephrosis. Bladder is unremarkable. Stomach/Bowel: Unremarkable stomach and small bowel. A right colon suture line is again demonstrated without visualization of the appendix. There is also a distal sigmoid colon anastomosis. Mildly progressive dilatation of the right and transverse colon, containing gas and fluid. Vascular/Lymphatic: Atheromatous arterial calcifications without aneurysm. No enlarged lymph nodes. Reproductive: Status post hysterectomy. No adnexal masses. Other: No significant change in a midline surgical scar containing a small oval fluid collection and adjacent soft tissue stranding in the subcutaneous fat. Musculoskeletal: Mild lumbar and lower thoracic spine degenerative changes. IMPRESSION: 1. Mildly progressive proximal transverse colon ileus no findings to suggest obstruction. 2. Possible sludge in the gallbladder. Electronically Signed   By: Percell Locus.D.  On: 12/24/2021 15:13   CT ABDOMEN PELVIS WO CONTRAST  Result Date: 12/21/2021 CLINICAL DATA:  Crohn's exacerbation EXAM: CT ABDOMEN AND PELVIS WITHOUT CONTRAST TECHNIQUE: Multidetector CT imaging of the abdomen and pelvis was performed following the standard protocol without IV contrast. COMPARISON:  06/05/2020 FINDINGS: Lower chest: Dependent atelectasis in the lower lobes. No effusions. Hepatobiliary: No focal hepatic abnormality. Gallbladder unremarkable. Pancreas: No focal abnormality or ductal dilatation. Spleen: No focal abnormality.  Normal size. Adrenals/Urinary Tract: No adrenal  abnormality. No focal renal abnormality. No stones or hydronephrosis. Urinary bladder is unremarkable. Stomach/Bowel: Postoperative changes in the sigmoid colon and cecum. Colon is moderately distended and fluid-filled. Mild haziness adjacent to the splenic flexure and descending colon. Stomach and small bowel decompressed, grossly unremarkable. Vascular/Lymphatic: Aortic atherosclerosis. No evidence of aneurysm or adenopathy. Reproductive: Prior hysterectomy.  No adnexal masses. Other: No free fluid or free air. Musculoskeletal: No acute bony abnormality. IMPRESSION: Mildly dilated, fluid-filled colon. Slight haziness/inflammation adjacent to the splenic flexure and descending colon. Findings could reflect ileus or possible early left colonic colitis. No evidence of bowel obstruction. Aortic atherosclerosis. Dependent bibasilar atelectasis. Electronically Signed   By: Rolm Baptise M.D.   On: 12/21/2021 02:42     Discharge Exam: Vitals:   12/26/21 2119 12/27/21 0509  BP: (!) 141/106 (!) 165/82  Pulse: 72 (!) 58  Resp: 17 20  Temp: 98.1 F (36.7 C) 98.1 F (36.7 C)  SpO2: 97% 100%   Vitals:   12/26/21 0500 12/26/21 1351 12/26/21 2119 12/27/21 0509  BP: (!) 148/79 121/80 (!) 141/106 (!) 165/82  Pulse: 73 92 72 (!) 58  Resp: 16 20 17 20   Temp: 98.2 F (36.8 C) 97.7 F (36.5 C) 98.1 F (36.7 C) 98.1 F (36.7 C)  TempSrc: Oral Oral Oral Oral  SpO2: 95% 95% 97% 100%  Weight:      Height:        General: Pt is alert, awake, not in acute distress Cardiovascular: RRR, S1/S2 +, no rubs, no gallops Respiratory: CTA bilaterally, no wheezing, no rhonchi Abdominal: Soft, NT, ND, bowel sounds + Extremities: no edema, no cyanosis    The results of significant diagnostics from this hospitalization (including imaging, microbiology, ancillary and laboratory) are listed below for reference.     Microbiology: Recent Results (from the past 240 hour(s))  Resp Panel by RT-PCR (Flu A&B, Covid)  Nasopharyngeal Swab     Status: None   Collection Time: 12/21/21  5:35 AM   Specimen: Nasopharyngeal Swab; Nasopharyngeal(NP) swabs in vial transport medium  Result Value Ref Range Status   SARS Coronavirus 2 by RT PCR NEGATIVE NEGATIVE Final    Comment: (NOTE) SARS-CoV-2 target nucleic acids are NOT DETECTED.  The SARS-CoV-2 RNA is generally detectable in upper respiratory specimens during the acute phase of infection. The lowest concentration of SARS-CoV-2 viral copies this assay can detect is 138 copies/mL. A negative result does not preclude SARS-Cov-2 infection and should not be used as the sole basis for treatment or other patient management decisions. A negative result may occur with  improper specimen collection/handling, submission of specimen other than nasopharyngeal swab, presence of viral mutation(s) within the areas targeted by this assay, and inadequate number of viral copies(<138 copies/mL). A negative result must be combined with clinical observations, patient history, and epidemiological information. The expected result is Negative.  Fact Sheet for Patients:  EntrepreneurPulse.com.au  Fact Sheet for Healthcare Providers:  IncredibleEmployment.be  This test is no t yet approved or cleared by the [place]  States FDA and  has been authorized for detection and/or diagnosis of SARS-CoV-2 by FDA under an Emergency Use Authorization (EUA). This EUA will remain  in effect (meaning this test can be used) for the duration of the COVID-19 declaration under Section 564(b)(1) of the Act, 21 U.S.C.section 360bbb-3(b)(1), unless the authorization is terminated  or revoked sooner.       Influenza A by PCR NEGATIVE NEGATIVE Final   Influenza B by PCR NEGATIVE NEGATIVE Final    Comment: (NOTE) The Xpert Xpress SARS-CoV-2/FLU/RSV plus assay is intended as an aid in the diagnosis of influenza from Nasopharyngeal swab specimens and should not be  used as a sole basis for treatment. Nasal washings and aspirates are unacceptable for Xpert Xpress SARS-CoV-2/FLU/RSV testing.  Fact Sheet for Patients: EntrepreneurPulse.com.au  Fact Sheet for Healthcare Providers: IncredibleEmployment.be  This test is not yet approved or cleared by the [place] FDA and has been authorized for detection and/or diagnosis of SARS-CoV-2 by FDA under an Emergency Use Authorization (EUA). This EUA will remain in effect (meaning this test can be used) for the duration of the COVID-19 declaration under Section 564(b)(1) of the Act, 21 U.S.C. section 360bbb-3(b)(1), unless the authorization is terminated or revoked.  Performed at [facility], [address], [place], [place] [address]   MRSA Next Gen by PCR, Nasal     Status: None   Collection Time: 12/21/21  8:34 AM   Specimen: Nasal Mucosa; Nasal Swab  Result Value Ref Range Status   MRSA by PCR Next Gen NOT DETECTED NOT DETECTED Final    Comment: (NOTE) The GeneXpert MRSA Assay (FDA approved for NASAL specimens only), is one component of a comprehensive MRSA colonization surveillance program. It is not intended to diagnose MRSA infection nor to guide or monitor treatment for MRSA infections. Test performance is not FDA approved in patients less than 10 years old. Performed at [facility], [address] [place], [place] [address]   Resp Panel by RT-PCR (Flu A&B, Covid) Nasopharyngeal Swab     Status: None   Collection Time: 12/24/21  3:56 PM   Specimen: Nasopharyngeal Swab; Nasopharyngeal(NP) swabs in vial transport medium  Result Value Ref Range Status   SARS Coronavirus 2 by RT PCR NEGATIVE NEGATIVE Final    Comment: (NOTE) SARS-CoV-2 target nucleic acids are NOT DETECTED.  The SARS-CoV-2 RNA is generally detectable in upper respiratory specimens during the acute phase of infection. The lowest concentration of SARS-CoV-2 viral copies this assay can  detect is 138 copies/mL. A negative result does not preclude SARS-Cov-2 infection and should not be used as the sole basis for treatment or other patient management decisions. A negative result may occur with  improper specimen collection/handling, submission of specimen other than nasopharyngeal swab, presence of viral mutation(s) within the areas targeted by this assay, and inadequate number of viral copies(<138 copies/mL). A negative result must be combined with clinical observations, patient history, and epidemiological information. The expected result is Negative.  Fact Sheet for Patients:  EntrepreneurPulse.com.au  Fact Sheet for Healthcare Providers:  IncredibleEmployment.be  This test is no t yet approved or cleared by the [place] FDA and  has been authorized for detection and/or diagnosis of SARS-CoV-2 by FDA under an Emergency Use Authorization (EUA). This EUA will remain  in effect (meaning this test can be used) for the duration of the COVID-19 declaration under Section 564(b)(1) of the Act, 21 U.S.C.section 360bbb-3(b)(1), unless the authorization is terminated  or revoked sooner.  Influenza A by PCR NEGATIVE NEGATIVE Final   Influenza B by PCR NEGATIVE NEGATIVE Final    Comment: (NOTE) The Xpert Xpress SARS-CoV-2/FLU/RSV plus assay is intended as an aid in the diagnosis of influenza from Nasopharyngeal swab specimens and should not be used as a sole basis for treatment. Nasal washings and aspirates are unacceptable for Xpert Xpress SARS-CoV-2/FLU/RSV testing.  Fact Sheet for Patients: EntrepreneurPulse.com.au  Fact Sheet for Healthcare Providers: IncredibleEmployment.be  This test is not yet approved or cleared by the [place] FDA and has been authorized for detection and/or diagnosis of SARS-CoV-2 by FDA under an Emergency Use Authorization (EUA). This EUA will remain in  effect (meaning this test can be used) for the duration of the COVID-19 declaration under Section 564(b)(1) of the Act, 21 U.S.C. section 360bbb-3(b)(1), unless the authorization is terminated or revoked.  Performed at [facility], [address] [place], [place] [address]      Labs: BNP (last 3 results) No results for input(s): BNP in the last 8760 hours. Basic Metabolic Panel: Recent Labs  Lab 12/22/21 0420 12/24/21 1351 12/25/21 0433 12/26/21 0405 12/27/21 0449  NA 136 140 141 140 138  K 3.7 3.2* 4.2 3.6 4.3  CL 107 108 109 108 105  CO2 22 25 26 24 27   GLUCOSE 148* 102* 96 80 111*  BUN 21 16 15 11 12   CREATININE 0.73 0.85 0.82 0.88 0.86  CALCIUM 7.7* 8.4* 8.2* 8.3* 8.7*  MG 1.6* 1.6* 2.1 1.8 1.9   Liver Function Tests: Recent Labs  Lab 12/21/21 0035 12/22/21 0420 12/24/21 1351 12/26/21 0405 12/27/21 0449  AST 21 14* 57* 52* 53*  ALT 15 14 34 40 60*  ALKPHOS 58 49 54 51 65  BILITOT 1.3* 0.4 0.7 0.8 0.7  PROT 6.1* 5.5* 5.7* 5.5* 5.7*  ALBUMIN 3.2* 2.9* 3.1* 2.9* 3.0*   No results for input(s): LIPASE, AMYLASE in the last 168 hours. No results for input(s): AMMONIA in the last 168 hours. CBC: Recent Labs  Lab 12/21/21 0035 12/22/21 0420 12/24/21 1351 12/25/21 0433 12/26/21 0405 12/27/21 0449  WBC 12.6* 16.0* 8.9 9.3 6.8 10.3  NEUTROABS 11.0* 15.1* 7.4  --   --   --   HGB 15.1* 13.2 12.7 13.0 12.9 12.6  HCT 41.7 36.5 37.2 38.7 37.6 36.5  MCV 106.1* 109.9* 108.8* 113.8* 113.6* 111.6*  PLT 292 258 245 270 247 256   Cardiac Enzymes: No results for input(s): CKTOTAL, CKMB, CKMBINDEX, TROPONINI in the last 168 hours. BNP: Invalid input(s): POCBNP CBG: No results for input(s): GLUCAP in the last 168 hours. D-Dimer No results for input(s): DDIMER in the last 72 hours. Hgb A1c No results for input(s): HGBA1C in the last 72 hours. Lipid Profile No results for input(s): CHOL, HDL, LDLCALC, TRIG, CHOLHDL, LDLDIRECT in the last 72 hours. Thyroid function  studies No results for input(s): TSH, T4TOTAL, T3FREE, THYROIDAB in the last 72 hours.  Invalid input(s): FREET3 Anemia work up No results for input(s): VITAMINB12, FOLATE, FERRITIN, TIBC, IRON, RETICCTPCT in the last 72 hours. Urinalysis    Component Value Date/Time   COLORURINE YELLOW 12/24/2021 1421   APPEARANCEUR CLEAR 12/24/2021 1421   LABSPEC 1.008 12/24/2021 1421   PHURINE 5.0 12/24/2021 1421   GLUCOSEU NEGATIVE 12/24/2021 1421   HGBUR SMALL (A) 12/24/2021 1421   BILIRUBINUR NEGATIVE 12/24/2021 1421   KETONESUR NEGATIVE 12/24/2021 1421   PROTEINUR NEGATIVE 12/24/2021 1421   UROBILINOGEN 0.2 12/27/2014 0950   NITRITE NEGATIVE 12/24/2021 1421   LEUKOCYTESUR  NEGATIVE 12/24/2021 1421   Sepsis Labs Invalid input(s): PROCALCITONIN,  WBC,  LACTICIDVEN Microbiology Recent Results (from the past 240 hour(s))  Resp Panel by RT-PCR (Flu A&B, Covid) Nasopharyngeal Swab     Status: None   Collection Time: 12/21/21  5:35 AM   Specimen: Nasopharyngeal Swab; Nasopharyngeal(NP) swabs in vial transport medium  Result Value Ref Range Status   SARS Coronavirus 2 by RT PCR NEGATIVE NEGATIVE Final    Comment: (NOTE) SARS-CoV-2 target nucleic acids are NOT DETECTED.  The SARS-CoV-2 RNA is generally detectable in upper respiratory specimens during the acute phase of infection. The lowest concentration of SARS-CoV-2 viral copies this assay can detect is 138 copies/mL. A negative result does not preclude SARS-Cov-2 infection and should not be used as the sole basis for treatment or other patient management decisions. A negative result may occur with  improper specimen collection/handling, submission of specimen other than nasopharyngeal swab, presence of viral mutation(s) within the areas targeted by this assay, and inadequate number of viral copies(<138 copies/mL). A negative result must be combined with clinical observations, patient history, and epidemiological information. The expected  result is Negative.  Fact Sheet for Patients:  EntrepreneurPulse.com.au  Fact Sheet for Healthcare Providers:  IncredibleEmployment.be  This test is no t yet approved or cleared by the [place] FDA and  has been authorized for detection and/or diagnosis of SARS-CoV-2 by FDA under an Emergency Use Authorization (EUA). This EUA will remain  in effect (meaning this test can be used) for the duration of the COVID-19 declaration under Section 564(b)(1) of the Act, 21 U.S.C.section 360bbb-3(b)(1), unless the authorization is terminated  or revoked sooner.       Influenza A by PCR NEGATIVE NEGATIVE Final   Influenza B by PCR NEGATIVE NEGATIVE Final    Comment: (NOTE) The Xpert Xpress SARS-CoV-2/FLU/RSV plus assay is intended as an aid in the diagnosis of influenza from Nasopharyngeal swab specimens and should not be used as a sole basis for treatment. Nasal washings and aspirates are unacceptable for Xpert Xpress SARS-CoV-2/FLU/RSV testing.  Fact Sheet for Patients: EntrepreneurPulse.com.au  Fact Sheet for Healthcare Providers: IncredibleEmployment.be  This test is not yet approved or cleared by the [place] FDA and has been authorized for detection and/or diagnosis of SARS-CoV-2 by FDA under an Emergency Use Authorization (EUA). This EUA will remain in effect (meaning this test can be used) for the duration of the COVID-19 declaration under Section 564(b)(1) of the Act, 21 U.S.C. section 360bbb-3(b)(1), unless the authorization is terminated or revoked.  Performed at [facility], [address] [place], [place] [address]   MRSA Next Gen by PCR, Nasal     Status: None   Collection Time: 12/21/21  8:34 AM   Specimen: Nasal Mucosa; Nasal Swab  Result Value Ref Range Status   MRSA by PCR Next Gen NOT DETECTED NOT DETECTED Final    Comment: (NOTE) The GeneXpert MRSA Assay (FDA approved for NASAL  specimens only), is one component of a comprehensive MRSA colonization surveillance program. It is not intended to diagnose MRSA infection nor to guide or monitor treatment for MRSA infections. Test performance is not FDA approved in patients less than 16 years old. Performed at [facility], [address], [place], [place] [address]   Resp Panel by RT-PCR (Flu A&B, Covid) Nasopharyngeal Swab     Status: None   Collection Time: 12/24/21  3:56 PM   Specimen: Nasopharyngeal Swab; Nasopharyngeal(NP) swabs in vial transport medium  Result Value Ref Range  Status   SARS Coronavirus 2 by RT PCR NEGATIVE NEGATIVE Final    Comment: (NOTE) SARS-CoV-2 target nucleic acids are NOT DETECTED.  The SARS-CoV-2 RNA is generally detectable in upper respiratory specimens during the acute phase of infection. The lowest concentration of SARS-CoV-2 viral copies this assay can detect is 138 copies/mL. A negative result does not preclude SARS-Cov-2 infection and should not be used as the sole basis for treatment or other patient management decisions. A negative result may occur with  improper specimen collection/handling, submission of specimen other than nasopharyngeal swab, presence of viral mutation(s) within the areas targeted by this assay, and inadequate number of viral copies(<138 copies/mL). A negative result must be combined with clinical observations, patient history, and epidemiological information. The expected result is Negative.  Fact Sheet for Patients:  EntrepreneurPulse.com.au  Fact Sheet for Healthcare Providers:  IncredibleEmployment.be  This test is no t yet approved or cleared by the [place] FDA and  has been authorized for detection and/or diagnosis of SARS-CoV-2 by FDA under an Emergency Use Authorization (EUA). This EUA will remain  in effect (meaning this test can be used) for the duration of the COVID-19 declaration under Section  564(b)(1) of the Act, 21 U.S.C.section 360bbb-3(b)(1), unless the authorization is terminated  or revoked sooner.       Influenza A by PCR NEGATIVE NEGATIVE Final   Influenza B by PCR NEGATIVE NEGATIVE Final    Comment: (NOTE) The Xpert Xpress SARS-CoV-2/FLU/RSV plus assay is intended as an aid in the diagnosis of influenza from Nasopharyngeal swab specimens and should not be used as a sole basis for treatment. Nasal washings and aspirates are unacceptable for Xpert Xpress SARS-CoV-2/FLU/RSV testing.  Fact Sheet for Patients: EntrepreneurPulse.com.au  Fact Sheet for Healthcare Providers: IncredibleEmployment.be  This test is not yet approved or cleared by the [place] FDA and has been authorized for detection and/or diagnosis of SARS-CoV-2 by FDA under an Emergency Use Authorization (EUA). This EUA will remain in effect (meaning this test can be used) for the duration of the COVID-19 declaration under Section 564(b)(1) of the Act, 21 U.S.C. section 360bbb-3(b)(1), unless the authorization is terminated or revoked.  Performed at [facility], [address], [place], [place] [address]      Time coordinating discharge: 35 minutes  SIGNED:   Rodena Goldmann, DO [facility] 12/27/2021, 10:13 AM  If 7PM-7AM, please contact night-coverage [email or web address]

## 2021-12-27 NOTE — Progress Notes (Signed)
Subjective: Patient notes feeling much better today.  No longer having abdominal pain.  Had multiple bowel movements.  Tolerating soft diet without problems.  Wishes to go home.  Objective: Vital signs in last 24 hours: Temp:  [97.7 F (36.5 C)-98.1 F (36.7 C)] 98.1 F (36.7 C) (01/15 0509) Pulse Rate:  [58-92] 58 (01/15 0509) Resp:  [17-20] 20 (01/15 0509) BP: (121-165)/(80-106) 165/82 (01/15 0509) SpO2:  [95 %-100 %] 100 % (01/15 0509) Last BM Date: 12/24/21 General:   Alert and oriented, pleasant Head:  Normocephalic and atraumatic. Eyes:  No icterus, sclera clear. Conjuctiva pink.  Abdomen:  Bowel sounds present, soft, non-tender, non-distended. No HSM or hernias noted. No rebound or guarding. No masses appreciated  Msk:  Symmetrical without gross deformities. Normal posture. Extremities:  Without clubbing or edema. Neurologic:  Alert and  oriented x4;  grossly normal neurologically. Skin:  Warm and dry, intact without significant lesions.  Cervical Nodes:  No significant cervical adenopathy. Psych:  Alert and cooperative. Normal mood and affect.  Intake/Output from previous day: 01/14 0701 - 01/15 0700 In: 720 [P.O.:720] Out: -  Intake/Output this shift: Total I/O In: 240 [P.O.:240] Out: -   Lab Results: Recent Labs    12/25/21 0433 12/26/21 0405 12/27/21 0449  WBC 9.3 6.8 10.3  HGB 13.0 12.9 12.6  HCT 38.7 37.6 36.5  PLT 270 247 256    BMET Recent Labs    12/25/21 0433 12/26/21 0405 12/27/21 0449  NA 141 140 138  K 4.2 3.6 4.3  CL 109 108 105  CO2 26 24 27   GLUCOSE 96 80 111*  BUN 15 11 12   CREATININE 0.82 0.88 0.86  CALCIUM 8.2* 8.3* 8.7*    LFT Recent Labs    12/24/21 1351 12/26/21 0405 12/27/21 0449  PROT 5.7* 5.5* 5.7*  ALBUMIN 3.1* 2.9* 3.0*  AST 57* 52* 53*  ALT 34 40 60*  ALKPHOS 54 51 65  BILITOT 0.7 0.8 0.7    PT/INR No results for input(s): LABPROT, INR in the last 72 hours. Hepatitis Panel No results for input(s):  HEPBSAG, HCVAB, HEPAIGM, HEPBIGM in the last 72 hours.   Studies/Results: No results found.  Assessment: *Colonic ileus *Crohn's colitis-improved on steroids *Abdominal pain  Plan: Patient notes clinical improvement today.  Okay to discharge from GI standpoint.  Continue simethicone.  Continue on steroid taper for recent Crohn's colitis flare.  Patient already has this medication at home.  Encourage ambulation and frequent turning in bed.  She has outpatient follow-up with her primary gastroenterologist Dr. Laural Golden already set up..  We will need to follow-up on her Humira levels and antibodies.  Elizabeth [name]. Elizabeth [name], D.O. Gastroenterology and Hepatology [facility]   LOS: 2 days    12/27/2021, 11:10 AM

## 2021-12-28 LAB — ADALIMUMAB LEVEL AND ANTIBODY
Adalimumab Drug Level: 1.2 ug/mL
Anti-Adalimumab Antibody: <25 ng/mL

## 2021-12-29 ENCOUNTER — Telehealth (INDEPENDENT_AMBULATORY_CARE_PROVIDER_SITE_OTHER): Payer: Self-pay | Admitting: *Deleted

## 2021-12-29 ENCOUNTER — Telehealth (INDEPENDENT_AMBULATORY_CARE_PROVIDER_SITE_OTHER): Payer: Self-pay | Admitting: Internal Medicine

## 2021-12-29 NOTE — Telephone Encounter (Signed)
See note from today ( 12/29/21)

## 2021-12-29 NOTE — Telephone Encounter (Signed)
Pt returning call to nurse. Please call [phone]

## 2021-12-29 NOTE — Telephone Encounter (Signed)
Pt called to give update. She is still having a little pain, not much, just weak, wanted to know what she can take for constipation because she does not want to go back to hospital.   [phone]  Left message to return call to get more information.

## 2021-12-29 NOTE — Telephone Encounter (Signed)
Pt states she does not eat much because she is scared to eat much, had BM yesterday and it was soft. Wants to know what is safe to take if she has constipation. Still having pain about one every 30 mins. No more vomiting.

## 2021-12-30 NOTE — Telephone Encounter (Signed)
Per dr Laural Golden take plain stool softner colace 278m one qd and may take miralax one half scoop prn. Called and discussed with pt. Pt verbalized understanding.

## 2022-01-07 ENCOUNTER — Other Ambulatory Visit (INDEPENDENT_AMBULATORY_CARE_PROVIDER_SITE_OTHER): Payer: Self-pay | Admitting: *Deleted

## 2022-01-07 MED ORDER — HUMIRA (2 SYRINGE) 40 MG/0.4ML ~~LOC~~ PSKT: 40.0000 mg | PREFILLED_SYRINGE | SUBCUTANEOUS | 5 refills | Status: DC

## 2022-01-07 NOTE — Progress Notes (Signed)
Pt called for refill on humira to [facility]. Fax number [phone]. [place] with one month plus 5 refills per dr Laural Golden. Refills sent. Pt was notified.

## 2022-01-13 ENCOUNTER — Telehealth (INDEPENDENT_AMBULATORY_CARE_PROVIDER_SITE_OTHER): Payer: Self-pay | Admitting: *Deleted

## 2022-01-13 NOTE — Telephone Encounter (Signed)
Fax from [employer] [facility].humira pen 89m/0.4ml pen injector kit is approved 01/11/22 -01/11/23.

## 2022-01-13 NOTE — Telephone Encounter (Signed)
Patient called and notified med approved and approval letter faxed to pt's pharm.

## 2022-01-14 ENCOUNTER — Other Ambulatory Visit (INDEPENDENT_AMBULATORY_CARE_PROVIDER_SITE_OTHER): Payer: Self-pay | Admitting: *Deleted

## 2022-01-14 MED ORDER — HUMIRA (2 PEN) 40 MG/0.4ML ~~LOC~~ AJKT: AUTO-INJECTOR | SUBCUTANEOUS | 5 refills | Status: DC

## 2022-01-19 ENCOUNTER — Ambulatory Visit (INDEPENDENT_AMBULATORY_CARE_PROVIDER_SITE_OTHER): Payer: 59 | Admitting: Internal Medicine

## 2022-01-21 ENCOUNTER — Other Ambulatory Visit: Payer: Self-pay

## 2022-01-21 ENCOUNTER — Encounter (INDEPENDENT_AMBULATORY_CARE_PROVIDER_SITE_OTHER): Payer: Self-pay | Admitting: Internal Medicine

## 2022-01-21 ENCOUNTER — Ambulatory Visit (INDEPENDENT_AMBULATORY_CARE_PROVIDER_SITE_OTHER): Payer: 59 | Admitting: Internal Medicine

## 2022-01-21 VITALS — BP 142/80 | Temp 99.1°F | Ht 67.0 in | Wt 211.4 lb

## 2022-01-21 DIAGNOSIS — R7989 Other specified abnormal findings of blood chemistry: Secondary | ICD-10-CM | POA: Diagnosis not present

## 2022-01-21 DIAGNOSIS — E559 Vitamin D deficiency, unspecified: Secondary | ICD-10-CM | POA: Insufficient documentation

## 2022-01-21 DIAGNOSIS — K219 Gastro-esophageal reflux disease without esophagitis: Secondary | ICD-10-CM

## 2022-01-21 DIAGNOSIS — K50018 Crohn's disease of small intestine with other complication: Secondary | ICD-10-CM

## 2022-01-21 NOTE — Patient Instructions (Signed)
Physician will call with results of blood work and stool test when completed. Will request hemorrhoid dose to be changed to once a week when blood work completed.

## 2022-01-21 NOTE — Progress Notes (Addendum)
Presenting complaint;  Follow-up for Crohn's disease.  Database and subjective:  Patient is 61 year old Caucasian female who has history of small and large bowel Crohn's disease status post ileocecectomy November 2005 at [facility] and status post resection of sigmoid colon for diverticulitis in January 2016 who is been maintained on Humira/vedolizumab.  Last colonoscopy was in November 2022 revealing active disease involving neoterminal ileum and noncritical anastomotic stricture.  There was edema to mucosa involving the descending colon.  Biopsy revealed hyperplastic changes as well as sessile serrated polyp.  Biopsy from sigmoid colon revealed focal active colitis.  Patient was advised to go back on oral mesalamine.  Last month she developed constipation and took 2 tablets of Dulcolax and developed severe cramping with nausea and vomiting.  She was briefly hospitalized at [facility] starting on 12/20/2021.  She was discharged 2 days later on Cipro and metronidazole.  Patient was seen in the office on 12/24/2021 as she was still having abdominal pain with distention and nausea.  She is stated she had not had a bowel movement for 2 days and also had not passed flatus.  She was noted to be in pain.  She was therefore referred to emergency room and hospitalized.  She was felt to have colonic ileus.  CT did not reveal any changes suggest obstruction.  She was treated with IV fluids and IV steroids.  Her ileus improved and she was able to go home 3 days later on 12/27/2021.  She was discharged on prednisone with a tapering dose.  She finished last week.  Patient states she is back at baseline.  She usually has 4-5 bowel movements after she eats breakfast.  Stools are mushy to loose.  No bleeding or melena.  She states if she does not eat breakfast then she will have bowel movements after she eats lunch.  She denies nausea vomiting or abdominal pain.  She feels puffy around her eyes.  She says she has not  taken her blood pressure medicine because when she left the hospital her blood pressure was low. She says her appetite is very good.  However she is watching what she eats.  She has lost 11 pounds since her last visit.  She feels heartburn is well controlled with therapy.  She had a mero/adalimumab drug and antibody level 2 days prior to her scheduled dose.  Results are listed below.  Current Medications: Outpatient Encounter Medications as of 01/21/2022  Medication Sig   Adalimumab (HUMIRA PEN) 40 MG/0.4ML PNKT Inject 21m into skin every 14 days   albuterol (VENTOLIN HFA) 108 (90 Base) MCG/ACT inhaler Inhale 2 puffs into the lungs every 6 (six) hours as needed for wheezing or shortness of breath.   fluticasone (FLONASE) 50 MCG/ACT nasal spray Place 2 sprays into both nostrils daily as needed for allergies or rhinitis.   furosemide (LASIX) 40 MG tablet Take 40 mg by mouth daily as needed for edema.   mesalamine (PENTASA) 500 MG CR capsule Take 1 capsule (500 mg total) by mouth 4 (four) times daily.   omeprazole (PRILOSEC) 40 MG capsule Take 40 mg by mouth daily before breakfast.   ondansetron (ZOFRAN-ODT) 8 MG disintegrating tablet TAKE 1 TABLET (8 MG TOTAL) BY MOUTH 2 (TWO) TIMES DAILY AS NEEDED.   potassium chloride (MICRO-K) 10 MEQ CR capsule Take 10 mEq by mouth 2 (two) times daily.    promethazine (PHENERGAN) 25 MG tablet Take one bid prn (Patient taking differently: Take 25 mg by mouth every  6 (six) hours as needed for nausea or vomiting. Take one bid prn)   rivaroxaban (XARELTO) 20 MG TABS tablet Take 1 tablet (20 mg total) by mouth daily with supper.   valsartan-hydrochlorothiazide (DIOVAN-HCT) 160-12.5 MG per tablet Take 1 tablet by mouth daily.    [DISCONTINUED] colestipol (COLESTID) 1 g tablet TAKE 2 TABS ONCE DAILY X 1 WEEK, MAY INCREASE TO 2 TABS TWICE DAILY IF NEEDED.-DO NOT TAKE WITHIN 2 HRS OF OTHER MEDS   No facility-administered encounter medications on file as of 01/21/2022.    Vitamin D 2000 units by mouth daily.  Objective: Blood pressure (!) 142/80, temperature 99.1 F (37.3 C), height _0  (1.702 m), weight 211 lb 6.4 oz (95.9 kg). Patient is alert and in no acute distress. Lower eyelids puffy. Conjunctiva is pink. Sclera is nonicteric Oropharyngeal mucosa is normal. No neck masses or thyromegaly noted. Cardiac exam with regular rhythm normal S1 and S2. No murmur or gallop noted. Lungs are clear to auscultation. Abdomen is full.  She has midline scar extending above and below the umbilicus.  Bowel sounds are normal.  On palpation abdomen is soft and nontender with organomegaly or masses. 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 Nonpitting pretibial edema.  Labs/studies Results:   CBC Latest Ref Rng & Units 12/27/2021 12/26/2021 12/25/2021  WBC 4.0 - 10.5 K/uL 10.3 6.8 9.3  Hemoglobin 12.0 - 15.0 g/dL 12.6 12.9 13.0  Hematocrit 36.0 - 46.0 % 36.5 37.6 38.7  Platelets 150 - 400 K/uL 256 247 270    CMP Latest Ref Rng & Units 12/27/2021 12/26/2021 12/25/2021  Glucose 70 - 99 mg/dL 111(H) 80 96  BUN 8 - 23 mg/dL _1 Creatinine 0.44 - 1.00 mg/dL 0.86 0.88 0.82  Sodium 135 - 145 mmol/L 138 140 141  Potassium 3.5 - 5.1 mmol/L 4.3 3.6 4.2  Chloride 98 - 111 mmol/L 105 108 109  CO2 22 - 32 mmol/L _2 Calcium 8.9 - 10.3 mg/dL 8.7(L) 8.3(L) 8.2(L)  Total Protein 6.5 - 8.1 g/dL 5.7(L) 5.5(L) -  Total Bilirubin 0.3 - 1.2 mg/dL 0.7 0.8 -  Alkaline Phos 38 - 126 U/L 65 51 -  AST 15 - 41 U/L 53(H) 52(H) -  ALT 0 - 44 U/L 60(H) 40 -    Hepatic Function Latest Ref Rng & Units 12/27/2021 12/26/2021 12/24/2021  Total Protein 6.5 - 8.1 g/dL 5.7(L) 5.5(L) 5.7(L)  Albumin 3.5 - 5.0 g/dL 3.0(L) 2.9(L) 3.1(L)  AST 15 - 41 U/L 53(H) 52(H) 57(H)  ALT 0 - 44 U/L 60(H) 40 34  Alk Phosphatase 38 - 126 U/L 65 51 54  Total Bilirubin 0.3 - 1.2 mg/dL 0.7 0.8 0.7  Bilirubin, Direct 0.0 - 0.2 mg/dL - - -    Lab Results  Component Value Date   CRP  1.7 (H) 12/21/2021   Serum magnesium was 1.2 on 12/21/2021 Serum magnesium was 1.6 on 12/24/2021 Serum magnesium 1.9 on 12/27/2021.  Adalimumab trough level 1.2 and antibody less than 25 performed on 12/21/2021.  CT abdomen and pelvis from 12/24/2021 reviewed. Dilation to proximal colon distal to ileocolonic anastomosis.  Small bowel not dilated.   Assessment:  #1.  Crohn's disease predominantly involving neoterminal ileum.  Patient presently is on vedolizumab and oral mesalamine.  Vedolizumab trough level was very low.  Since she is still having loose stools she may benefit from higher dose of vedolizumab.  Patient has received hepatitis B vaccination and she was documented  to have positive hepatitis B surface antibody.  Therefore she does not need to be tested antigen in future.  #2.  History of hypomagnesemia.  Will check serum magnesium.  #3.  History of vitamin D deficiency.  Last level was 14 months ago.  She has been on oral supplement.  Well-documented vitamin D2 level is normal at the weight she may need higher dose.  #4.  Abnormal LFTs felt to be due to fatty liver.  #5.  GERD.  She is doing well with therapy.  At some point would consider dropping PPI dose.  Plan:  Patient will go to the lab for the following CBC with differential, comprehensive chemistry panel,  CRP, serum magnesium and QuantiFERON-TB gold plus. Fecal calprotectin. Vitamin D2 level. Once the studies are reviewed would send request for insurance to change Humira/adalimumab dose to 40 mg every week. She will undergo colonoscopy at some point this year since biopsy from descending colon suggested sessile serrated polyp. Office visit in 2 months.

## 2022-01-27 LAB — CBC WITH DIFFERENTIAL/PLATELET
Absolute Monocytes: 305 cells/uL (ref 200–950)
Basophils Absolute: 50 cells/uL (ref 0–200)
Basophils Relative: 0.7 %
Eosinophils Absolute: 57 cells/uL (ref 15–500)
Eosinophils Relative: 0.8 %
HCT: 40.4 % (ref 35.0–45.0)
Hemoglobin: 14.4 g/dL (ref 11.7–15.5)
Lymphs Abs: 2016 cells/uL (ref 850–3900)
MCH: 37.1 pg — ABNORMAL HIGH (ref 27.0–33.0)
MCHC: 35.6 g/dL (ref 32.0–36.0)
MCV: 104.1 fL — ABNORMAL HIGH (ref 80.0–100.0)
MPV: 10.7 fL (ref 7.5–12.5)
Monocytes Relative: 4.3 %
Neutro Abs: 4672 cells/uL (ref 1500–7800)
Neutrophils Relative %: 65.8 %
Platelets: 188 10*3/uL (ref 140–400)
RBC: 3.88 10*6/uL (ref 3.80–5.10)
RDW: 12.9 % (ref 11.0–15.0)
Total Lymphocyte: 28.4 %
WBC: 7.1 10*3/uL (ref 3.8–10.8)

## 2022-01-27 LAB — COMPREHENSIVE METABOLIC PANEL
AG Ratio: 1.8 (calc) (ref 1.0–2.5)
ALT: 24 U/L (ref 6–29)
AST: 21 U/L (ref 10–35)
Albumin: 3.9 g/dL (ref 3.6–5.1)
Alkaline phosphatase (APISO): 59 U/L (ref 37–153)
BUN: 18 mg/dL (ref 7–25)
CO2: 21 mmol/L (ref 20–32)
Calcium: 8.5 mg/dL — ABNORMAL LOW (ref 8.6–10.4)
Chloride: 108 mmol/L (ref 98–110)
Creat: 0.91 mg/dL (ref 0.50–1.05)
Globulin: 2.2 g/dL (calc) (ref 1.9–3.7)
Glucose, Bld: 98 mg/dL (ref 65–99)
Potassium: 3.7 mmol/L (ref 3.5–5.3)
Sodium: 140 mmol/L (ref 135–146)
Total Bilirubin: 0.7 mg/dL (ref 0.2–1.2)
Total Protein: 6.1 g/dL (ref 6.1–8.1)

## 2022-01-27 LAB — QUANTIFERON-TB GOLD PLUS
Mitogen-NIL: >10 IU/mL
NIL: 0.07 IU/mL
QuantiFERON-TB Gold Plus: NEGATIVE
TB1-NIL: <0 IU/mL
TB2-NIL: <0 IU/mL

## 2022-01-27 LAB — MAGNESIUM: Magnesium: 1.1 mg/dL — ABNORMAL LOW (ref 1.5–2.5)

## 2022-01-27 LAB — C-REACTIVE PROTEIN: CRP: 5.8 mg/L (ref <8.0)

## 2022-01-27 LAB — VITAMIN D 25 HYDROXY (VIT D DEFICIENCY, FRACTURES): Vit D, 25-Hydroxy: 25 ng/mL — ABNORMAL LOW (ref 30–100)

## 2022-01-27 LAB — CALPROTECTIN: Calprotectin: 331 mcg/g — ABNORMAL HIGH

## 2022-02-03 NOTE — Telephone Encounter (Signed)
error 

## 2022-03-10 ENCOUNTER — Other Ambulatory Visit (INDEPENDENT_AMBULATORY_CARE_PROVIDER_SITE_OTHER): Payer: Self-pay | Admitting: *Deleted

## 2022-03-10 DIAGNOSIS — E559 Vitamin D deficiency, unspecified: Secondary | ICD-10-CM

## 2022-03-16 ENCOUNTER — Ambulatory Visit (INDEPENDENT_AMBULATORY_CARE_PROVIDER_SITE_OTHER): Payer: 59 | Admitting: Orthopaedic Surgery

## 2022-03-16 ENCOUNTER — Encounter: Payer: Self-pay | Admitting: Orthopaedic Surgery

## 2022-03-16 ENCOUNTER — Ambulatory Visit (INDEPENDENT_AMBULATORY_CARE_PROVIDER_SITE_OTHER): Payer: 59 | Admitting: Internal Medicine

## 2022-03-16 ENCOUNTER — Encounter (INDEPENDENT_AMBULATORY_CARE_PROVIDER_SITE_OTHER): Payer: Self-pay | Admitting: Internal Medicine

## 2022-03-16 VITALS — BP 117/85 | HR 87 | Ht 67.0 in | Wt 210.0 lb

## 2022-03-16 VITALS — BP 100/52 | HR 110 | Temp 98.0°F | Ht 67.0 in | Wt 210.0 lb

## 2022-03-16 DIAGNOSIS — M7062 Trochanteric bursitis, left hip: Secondary | ICD-10-CM

## 2022-03-16 DIAGNOSIS — R7989 Other specified abnormal findings of blood chemistry: Secondary | ICD-10-CM

## 2022-03-16 DIAGNOSIS — E559 Vitamin D deficiency, unspecified: Secondary | ICD-10-CM

## 2022-03-16 DIAGNOSIS — K50819 Crohn's disease of both small and large intestine with unspecified complications: Secondary | ICD-10-CM

## 2022-03-16 MED ORDER — HUMIRA (2 PEN) 40 MG/0.4ML ~~LOC~~ AJKT: 40.0000 mg | AUTO-INJECTOR | SUBCUTANEOUS | 5 refills | Status: DC

## 2022-03-16 MED ORDER — HYDROCODONE-ACETAMINOPHEN 5-325 MG PO TABS: ORAL_TABLET | ORAL | 0 refills | Status: DC

## 2022-03-16 NOTE — Progress Notes (Signed)
She has recurrence of left hip bursitis over the last few weeks.  She is not improving.  She requests injection which helped in the past. ? ?PROCEDURE NOTE: ? ?The patient request injection, verbal consent was obtained. ? ?The left trochanteric area of the hip was prepped appropriately after time out was performed.  ? ?Sterile technique was observed and injection of 1 cc of DepoMedrol 40 mg with several cc's of plain xylocaine. Anesthesia was provided by ethyl chloride and a 20-gauge needle was used to inject the hip area. The injection was tolerated well. ? ?A band aid dressing was applied. ? ?The patient was advised to apply ice later today and tomorrow to the injection sight as needed. ? ?Encounter Diagnosis  ?Name Primary?  ? Trochanteric bursitis, left hip Yes  ? ?Return in two weeks. ? ?I have reviewed the [place] web site prior to prescribing narcotic medicine for this patient. ? ?Call if any problem. ? ?Precautions discussed. ? ?Electronically Signed ?Sanjuana Kava, MD ?4/4/202310:52 AM ? ?

## 2022-03-16 NOTE — Progress Notes (Signed)
Presenting complaint; ? ?Follow-up for Crohn's disease. ? ?Database and subjective: ? ?Patient is 60-year-old Caucasian female who is here for scheduled visit.  She was last seen on 01/21/2022.  She has a history of small and large bowel Crohn's disease.  She has had ileocecectomy November 2005 at [place].  In January 2016 she had an sigmoid colon resection for diverticulitis. ?Her last colonoscopy was in November 2022 revealing active disease involving neoterminal ileum noncritical anastomotic stricture as well as active disease in sigmoid colon.  Biopsy from 1 inflamed area revealed hyperplastic changes and sessile serrated polyp. ?She has been maintained on adalimumab and oral mesalamine. ?She also has a history of mildly elevated transaminases felt to be secondary to fatty liver.  Transaminases were normal in 01/22/2022 when AST was 21 and ALT of 24. ?She also has a history of GERD and IBS. ? ?Patient remains with at least 3-4 stools per day.  She never has formed stool.  Some of her stools are soft but mostly loose.  She denies melena or rectal bleeding but she did pass mucus this morning.  She feels bloated.  She also complains of firmness in supraumbilical area where she had herniorrhaphy.  She just wants to make sure it is not abnormal.   ?She had severe cramping 3 days last week.  She is also having left hip pain secondary to intertrochanteric bursitis.  She was prescribed prednisone by Dr. Keeling.  She believes prednisone helped with cramping.  She does not take any NSAIDs. ?She remains with good appetite despite her symptoms.  She is interested in treatment options of to help her lose weight.  She will discuss this with Dr. Terry Daniel. ?Patient states heartburn is well controlled with therapy. ?Patient states she takes furosemide occasionally.  She has not taken in least a month.  ?She is using ondansetron once in a while.  Last dose was about a month ago. ? ? ?Current Medications: ?Outpatient  Encounter Medications as of 03/16/2022  ?Medication Sig  ? Adalimumab (HUMIRA PEN) 40 MG/0.4ML PNKT Inject 40mg into skin every 14 days  ? albuterol (VENTOLIN HFA) 108 (90 Base) MCG/ACT inhaler Inhale 2 puffs into the lungs every 6 (six) hours as needed for wheezing or shortness of breath.  ? fluticasone (FLONASE) 50 MCG/ACT nasal spray Place 2 sprays into both nostrils daily as needed for allergies or rhinitis.  ? furosemide (LASIX) 40 MG tablet Take 40 mg by mouth daily as needed for edema.  ? HYDROcodone-acetaminophen (NORCO/VICODIN) 5-325 MG tablet One tablet every four hours for pain.  ? mesalamine (PENTASA) 500 MG CR capsule Take 1 capsule (500 mg total) by mouth 4 (four) times daily. (Patient taking differently: Take 1,000 mg by mouth 4 (four) times daily. 2 qid)  ? omeprazole (PRILOSEC) 40 MG capsule Take 40 mg by mouth daily before breakfast.  ? ondansetron (ZOFRAN-ODT) 8 MG disintegrating tablet TAKE 1 TABLET (8 MG TOTAL) BY MOUTH 2 (TWO) TIMES DAILY AS NEEDED.  ? potassium chloride (MICRO-K) 10 MEQ CR capsule Take 10 mEq by mouth 2 (two) times daily.   ? promethazine (PHENERGAN) 25 MG tablet Take one bid prn (Patient taking differently: Take 25 mg by mouth every 6 (six) hours as needed for nausea or vomiting. Take one bid prn)  ? rivaroxaban (XARELTO) 20 MG TABS tablet Take 1 tablet (20 mg total) by mouth daily with supper.  ? valsartan-hydrochlorothiazide (DIOVAN-HCT) 160-12.5 MG per tablet Take 1 tablet by mouth daily.   ? ?No   facility-administered encounter medications on file as of 03/16/2022.  ? ? ? ?Objective: ?Blood pressure (!) 100/52, pulse (!) 110, temperature 98 ?F (36.7 ?C), temperature source Oral, height 5' 7" (1.702 m), weight 210 lb (95.3 kg). ?Patient is alert and in no acute distress. ?Conjunctiva is pink. Sclera is nonicteric ?Oropharyngeal mucosa is normal. ?No neck masses or thyromegaly noted. ?Cardiac exam with regular rhythm normal S1 and S2. No murmur or gallop noted. ?Lungs are clear  to auscultation. ?Abdomen is full.  She has low midline scar.  She also has scar above umbilicus which is firm but not tender.  Bowel sounds hyperactive.  On palpation abdomen is soft without tenderness organomegaly or masses. ?No LE edema or clubbing noted. ? ?Labs/studies Results: ? ? ? ?  Latest Ref Rng & Units 01/22/2022  ? 11:02 AM 12/27/2021  ?  4:49 AM 12/26/2021  ?  4:05 AM  ?CBC  ?WBC 3.8 - 10.8 Thousand/uL 7.1   10.3   6.8    ?Hemoglobin 11.7 - 15.5 g/dL 14.4   12.6   12.9    ?Hematocrit 35.0 - 45.0 % 40.4   36.5   37.6    ?Platelets 140 - 400 Thousand/uL 188   256   247    ?  ? ?  Latest Ref Rng & Units 01/22/2022  ? 11:02 AM 12/27/2021  ?  4:49 AM 12/26/2021  ?  4:05 AM  ?CMP  ?Glucose 65 - 99 mg/dL 98   111   80    ?BUN 7 - 25 mg/dL 18   12   11    ?Creatinine 0.50 - 1.05 mg/dL 0.91   0.86   0.88    ?Sodium 135 - 146 mmol/L 140   138   140    ?Potassium 3.5 - 5.3 mmol/L 3.7   4.3   3.6    ?Chloride 98 - 110 mmol/L 108   105   108    ?CO2 20 - 32 mmol/L 21   27   24    ?Calcium 8.6 - 10.4 mg/dL 8.5   8.7   8.3    ?Total Protein 6.1 - 8.1 g/dL 6.1   5.7   5.5    ?Total Bilirubin 0.2 - 1.2 mg/dL 0.7   0.7   0.8    ?Alkaline Phos 38 - 126 U/L  65   51    ?AST 10 - 35 U/L 21   53   52    ?ALT 6 - 29 U/L 24   60   40    ?  ? ?  Latest Ref Rng & Units 01/22/2022  ? 11:02 AM 12/27/2021  ?  4:49 AM 12/26/2021  ?  4:05 AM  ?Hepatic Function  ?Total Protein 6.1 - 8.1 g/dL 6.1   5.7   5.5    ?Albumin 3.5 - 5.0 g/dL  3.0   2.9    ?AST 10 - 35 U/L 21   53   52    ?ALT 6 - 29 U/L 24   60   40    ?Alk Phosphatase 38 - 126 U/L  65   51    ?Total Bilirubin 0.2 - 1.2 mg/dL 0.7   0.7   0.8    ?  ?Lab Results  ?Component Value Date  ? CRP 5.8 01/22/2022  ?  ?Other lab data from 01/22/2022 ? ?Fecal calprotectin 331. ?Vitamin D2 level 25 ?Point of front TB Gold plus negative. ? ?  Adalimumab drug level 1.2 on 12/21/2021 ?Antiadalimumab antibody less than 25. ? ? ?Assessment: ? ?#1.  Crohn's disease involving neoterminal ileum and sigmoid  colon.  She is on adalimumab and oral mesalamine and still with diarrhea.  Fecal calprotectin has gone up.  She also had active disease on colonoscopy of November 2022.  Addition of oral mesalamine to anti-TNF has not made any difference.  She had had low adalimumab drug level and did not have antibody.  Therefore would recommend increasing adalimumab to every week. ? ?#2.  History of vitamin D deficiency.  She is due for repeat labs to document deficiency has corrected. ? ?#3.  History of elevated transaminases secondary to fatty liver.  Transaminases 7 weeks ago were normal.  We will continue to monitor. ? ?#4.  GERD.  Symptoms well controlled with therapy. ? ?#5.  Biopsy from inflamed mucosa in descending colon revealed hyperplastic changes along with sessile serrated polyp in November 2022.  She will need colonoscopy sometime this year when she is deemed to be in remission. ? ? ?Plan: ? ?Patient will go to the lab for the following. ?Metabolic 7, vitamin D2 level, serum magnesium. ?Fecal calprotectin. ?Send request to patient's insurance company to increase Humira/adalimumab dose to 40 mg SQ every week. ?Office visit in 10 weeks. ? ? ? ?  ?

## 2022-03-16 NOTE — Patient Instructions (Signed)
Physician will call with results of blood and stool test. ?

## 2022-03-17 ENCOUNTER — Other Ambulatory Visit (INDEPENDENT_AMBULATORY_CARE_PROVIDER_SITE_OTHER): Payer: Self-pay | Admitting: *Deleted

## 2022-03-22 ENCOUNTER — Telehealth (INDEPENDENT_AMBULATORY_CARE_PROVIDER_SITE_OTHER): Payer: Self-pay | Admitting: *Deleted

## 2022-03-22 NOTE — Telephone Encounter (Signed)
I called pharmacy and it did show it could be processed tomorrow. I called and let pt know what pharmacy told me and to call me back if any problems getting med. She verbalized understanding.  ?

## 2022-03-22 NOTE — Telephone Encounter (Signed)
Dr. Laural Golden changed Humira dose from every 14 days to every 7 days at visit last week. PA done through cover my meds and letter from [employer] states med is approved from 03/22/22 - 1/30-24. Pt called and notified and letter of approval faxed to pharmacy fax number [phone].  ?

## 2022-03-23 ENCOUNTER — Other Ambulatory Visit (INDEPENDENT_AMBULATORY_CARE_PROVIDER_SITE_OTHER): Payer: Self-pay | Admitting: *Deleted

## 2022-03-23 ENCOUNTER — Telehealth (INDEPENDENT_AMBULATORY_CARE_PROVIDER_SITE_OTHER): Payer: Self-pay | Admitting: *Deleted

## 2022-03-23 MED ORDER — PREDNISONE 10 MG PO TABS: ORAL_TABLET | ORAL | 0 refills | Status: DC

## 2022-03-23 NOTE — Telephone Encounter (Signed)
Per dr Laural Golden - pt needs to do lab work and stool test before starting prednisone then can do prednisone 70m for 7 days, then 258mfor 7 days, then 1043mor 7 days, then 5mg66mr 7 days then stop. Start humira injection weekly. Called and discussed with pt and she verbalized understanding. States she will try to do labs tomorrow. [facility]  ?

## 2022-03-23 NOTE — Telephone Encounter (Signed)
Pt states last 3 mornings she wakes around 3 am and and strains to go to bathroom and then gets sick and vomits  and then feels better. Last BM this morning. Eats about 6:30 at night. Wonders if she needs prednisone. Worried she is getting sick again last she did last time. Last dose of humira was on the 3rd. She was approved for once a week humira but she has not taken it this week yet.  ? ?[phone] ?

## 2022-03-25 ENCOUNTER — Ambulatory Visit (INDEPENDENT_AMBULATORY_CARE_PROVIDER_SITE_OTHER): Payer: 59 | Admitting: Internal Medicine

## 2022-03-26 ENCOUNTER — Other Ambulatory Visit (INDEPENDENT_AMBULATORY_CARE_PROVIDER_SITE_OTHER): Payer: Self-pay | Admitting: *Deleted

## 2022-03-26 ENCOUNTER — Other Ambulatory Visit (INDEPENDENT_AMBULATORY_CARE_PROVIDER_SITE_OTHER): Payer: Self-pay | Admitting: Internal Medicine

## 2022-03-26 DIAGNOSIS — R790 Abnormal level of blood mineral: Secondary | ICD-10-CM

## 2022-03-26 MED ORDER — MAGNESIUM OXIDE 250 MG PO TABS: 250.0000 mg | ORAL_TABLET | Freq: Two times a day (BID) | ORAL | 2 refills | Status: DC

## 2022-03-29 ENCOUNTER — Telehealth (INDEPENDENT_AMBULATORY_CARE_PROVIDER_SITE_OTHER): Payer: Self-pay | Admitting: *Deleted

## 2022-03-29 NOTE — Telephone Encounter (Signed)
Fax from [employer] request for humira pen 4 per 28 days is unable to review this request for PA as the medication has been previously approved. No further action is needed at this time. Refill is payable on or after 04/02/22. Letter put in paper chart. I called patient and she told me she was able to get med from pharmacy.  ?

## 2022-03-30 ENCOUNTER — Ambulatory Visit (INDEPENDENT_AMBULATORY_CARE_PROVIDER_SITE_OTHER): Payer: 59 | Admitting: Orthopaedic Surgery

## 2022-03-30 ENCOUNTER — Encounter: Payer: Self-pay | Admitting: Orthopaedic Surgery

## 2022-03-30 DIAGNOSIS — M7062 Trochanteric bursitis, left hip: Secondary | ICD-10-CM | POA: Diagnosis not present

## 2022-03-30 MED ORDER — HYDROCODONE-ACETAMINOPHEN 5-325 MG PO TABS: ORAL_TABLET | ORAL | 0 refills | Status: DC

## 2022-03-30 NOTE — Progress Notes (Signed)
PROCEDURE NOTE: ? ?The patient request injection, verbal consent was obtained. ? ?The left trochanteric area of the hip was prepped appropriately after time out was performed.  ? ?Sterile technique was observed and injection of 1 cc of DepoMedrol 40 mg with several cc's of plain xylocaine. Anesthesia was provided by ethyl chloride and a 20-gauge needle was used to inject the hip area. The injection was tolerated well. ? ?A band aid dressing was applied. ? ?The patient was advised to apply ice later today and tomorrow to the injection sight as needed. ? ?Encounter Diagnosis  ?Name Primary?  ? Trochanteric bursitis, left hip Yes  ? ?Return in three weeks. ? ?I have reviewed the [place] web site prior to prescribing narcotic medicine for this patient. ? ?Call if any problem. ? ?Precautions discussed. ? ?Electronically Signed ?Sanjuana Kava, MD ?4/18/202310:00 AM ? ?

## 2022-04-01 LAB — BASIC METABOLIC PANEL
BUN: 22 mg/dL (ref 7–25)
CO2: 23 mmol/L (ref 20–32)
Calcium: 8.8 mg/dL (ref 8.6–10.4)
Chloride: 105 mmol/L (ref 98–110)
Creat: 0.99 mg/dL (ref 0.50–1.05)
Glucose, Bld: 96 mg/dL (ref 65–99)
Potassium: 3.6 mmol/L (ref 3.5–5.3)
Sodium: 139 mmol/L (ref 135–146)

## 2022-04-01 LAB — MAGNESIUM: Magnesium: 1.3 mg/dL — ABNORMAL LOW (ref 1.5–2.5)

## 2022-04-01 LAB — CALPROTECTIN: Calprotectin: 352 mcg/g — ABNORMAL HIGH

## 2022-04-01 LAB — VITAMIN D 25 HYDROXY (VIT D DEFICIENCY, FRACTURES): Vit D, 25-Hydroxy: 41 ng/mL (ref 30–100)

## 2022-04-02 ENCOUNTER — Telehealth (INDEPENDENT_AMBULATORY_CARE_PROVIDER_SITE_OTHER): Payer: Self-pay | Admitting: *Deleted

## 2022-04-02 ENCOUNTER — Other Ambulatory Visit (INDEPENDENT_AMBULATORY_CARE_PROVIDER_SITE_OTHER): Payer: Self-pay | Admitting: *Deleted

## 2022-04-02 DIAGNOSIS — R790 Abnormal level of blood mineral: Secondary | ICD-10-CM

## 2022-04-04 ENCOUNTER — Other Ambulatory Visit (INDEPENDENT_AMBULATORY_CARE_PROVIDER_SITE_OTHER): Payer: Self-pay | Admitting: Internal Medicine

## 2022-04-05 NOTE — Telephone Encounter (Signed)
03/16/22 last seen by Dr. Laural Golden. ?

## 2022-04-07 ENCOUNTER — Ambulatory Visit (INDEPENDENT_AMBULATORY_CARE_PROVIDER_SITE_OTHER): Payer: 59 | Admitting: Orthopaedic Surgery

## 2022-04-07 ENCOUNTER — Ambulatory Visit (INDEPENDENT_AMBULATORY_CARE_PROVIDER_SITE_OTHER): Payer: 59

## 2022-04-07 ENCOUNTER — Encounter: Payer: Self-pay | Admitting: Orthopaedic Surgery

## 2022-04-07 DIAGNOSIS — M7061 Trochanteric bursitis, right hip: Secondary | ICD-10-CM | POA: Diagnosis not present

## 2022-04-07 DIAGNOSIS — M25551 Pain in right hip: Secondary | ICD-10-CM

## 2022-04-07 MED ORDER — HYDROCODONE-ACETAMINOPHEN 10-325 MG PO TABS
ORAL_TABLET | ORAL | 0 refills | Status: DC
Start: 2022-04-07 — End: 2022-05-27

## 2022-04-07 NOTE — Progress Notes (Signed)
My right hip popped and hurts. ? ?She has pain of the right hip laterally.  It began two days ago.  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 ? ?She has pain of the right hip but full ROM.  Pain is laterally.  She limps.  She is in pain.   ? ?NV intact. ROM of hip on right is full.  No redness.  Very tender over the trochanteric bursa. ? ?X-rays were done of the right hip, reported separately. ? ?Encounter Diagnoses  ?Name Primary?  ? Acute right hip pain Yes  ? Trochanteric bursitis, right hip   ? ?PROCEDURE NOTE: ? ?The patient request injection, verbal consent was obtained. ? ?The right trochanteric area of the hip was prepped appropriately after time out was performed.  ? ?Sterile technique was observed and injection of 1 cc of DepoMedrol 93m with several cc's of plain xylocaine. Anesthesia was provided by ethyl chloride and a 20-gauge needle was used to inject the hip area. The injection was tolerated well. ? ?A band aid dressing was applied. ? ?The patient was advised to apply ice later today and tomorrow to the injection sight as needed. ? ?[place] to prescribing narcotic medicine for this patient. ? ?Return in two weeks. ? ?Call if any problem. ? ?Precautions discussed. ?Electronically Signed ?WSanjuana Kava MD ?4/26/202310:22 AM ? ? ?

## 2022-04-20 ENCOUNTER — Ambulatory Visit: Payer: 59 | Admitting: Orthopaedic Surgery

## 2022-04-20 ENCOUNTER — Telehealth (INDEPENDENT_AMBULATORY_CARE_PROVIDER_SITE_OTHER): Payer: Self-pay | Admitting: *Deleted

## 2022-04-20 ENCOUNTER — Encounter: Payer: Self-pay | Admitting: Orthopaedic Surgery

## 2022-04-20 VITALS — BP 128/88 | HR 72 | Ht 67.0 in | Wt 218.0 lb

## 2022-04-20 DIAGNOSIS — M7061 Trochanteric bursitis, right hip: Secondary | ICD-10-CM

## 2022-04-20 DIAGNOSIS — M7062 Trochanteric bursitis, left hip: Secondary | ICD-10-CM | POA: Diagnosis not present

## 2022-04-20 NOTE — Telephone Encounter (Signed)
Patient called and states Dr. Quillian Quince did bw for magnessium level and states it was normal and asking if she needs to continue taking magnessium 250 mg one bid.  ?[phone].  ?

## 2022-04-20 NOTE — Progress Notes (Signed)
I am better. ? ?Her hips are not that tender today.  She is walking well.  She is sleeping well. ? ?Hips have only slight tenderness over the trochanteric area laterally. ROM is full and gait is normal.  NV intact. ? ?Encounter Diagnoses  ?Name Primary?  ? Trochanteric bursitis, right hip Yes  ? Trochanteric bursitis, left hip   ? ?I will see as needed. ? ?Call if any problem. ? ?Precautions discussed. ? ?Electronically Signed ?Sanjuana Kava, MD ?5/9/202310:20 AM ? ?

## 2022-04-21 ENCOUNTER — Ambulatory Visit: Payer: 59 | Admitting: Orthopaedic Surgery

## 2022-04-21 NOTE — Telephone Encounter (Signed)
Left message to return call 

## 2022-04-21 NOTE — Telephone Encounter (Signed)
Per dr Laural Golden should continue to take at least one magnessium daily.  ?Left message to return call.  ?

## 2022-04-22 NOTE — Telephone Encounter (Signed)
Patient was notified.

## 2022-04-26 ENCOUNTER — Telehealth (INDEPENDENT_AMBULATORY_CARE_PROVIDER_SITE_OTHER): Payer: Self-pay | Admitting: *Deleted

## 2022-04-26 NOTE — Telephone Encounter (Signed)
Pt states last 3 nights she gets stomach cramps, screaming in pain because she could not have BM, then vomits, then will have a loose BM, then has chills, finished prednisone last week and last 3 days feel like she has no energy and can't put one foot in front of the other. Has pulled about 14 ticks off of her recently. States she will call pcp to see if he will test her for lymes disease. On humira once weekly. This happens about the same time every night between 1 -2 am. Last between 1 -5 hours. Has a stool every day.  ? ?[phone] ?

## 2022-04-27 NOTE — Telephone Encounter (Signed)
Elizabeth [name] Per Dr. Laural Golden - schedule MR enterography. Patient is aware dr Laural Golden wants this test and states any day is good.  ?

## 2022-04-28 ENCOUNTER — Other Ambulatory Visit (INDEPENDENT_AMBULATORY_CARE_PROVIDER_SITE_OTHER): Payer: Self-pay

## 2022-04-28 ENCOUNTER — Telehealth (INDEPENDENT_AMBULATORY_CARE_PROVIDER_SITE_OTHER): Payer: Self-pay

## 2022-04-28 ENCOUNTER — Encounter (INDEPENDENT_AMBULATORY_CARE_PROVIDER_SITE_OTHER): Payer: Self-pay

## 2022-04-28 DIAGNOSIS — R109 Unspecified abdominal pain: Secondary | ICD-10-CM

## 2022-04-28 DIAGNOSIS — R112 Nausea with vomiting, unspecified: Secondary | ICD-10-CM

## 2022-04-28 DIAGNOSIS — K50819 Crohn's disease of both small and large intestine with unspecified complications: Secondary | ICD-10-CM

## 2022-04-28 MED ORDER — DIPHENHYDRAMINE HCL 50 MG PO TABS: 50.0000 mg | ORAL_TABLET | Freq: Every evening | ORAL | 0 refills | Status: DC | PRN

## 2022-04-28 MED ORDER — PREDNISONE 50 MG PO TABS: ORAL_TABLET | ORAL | 0 refills | Status: DC

## 2022-04-28 NOTE — Telephone Encounter (Signed)
Rim Thatch Ann Seldon Barrell, CMA  ?

## 2022-04-28 NOTE — Telephone Encounter (Signed)
Elizabeth [name] Ann Harbour Nordmeyer, CMA  ?

## 2022-04-30 ENCOUNTER — Other Ambulatory Visit (INDEPENDENT_AMBULATORY_CARE_PROVIDER_SITE_OTHER): Payer: Self-pay | Admitting: *Deleted

## 2022-04-30 DIAGNOSIS — K50819 Crohn's disease of both small and large intestine with unspecified complications: Secondary | ICD-10-CM

## 2022-05-08 ENCOUNTER — Other Ambulatory Visit (INDEPENDENT_AMBULATORY_CARE_PROVIDER_SITE_OTHER): Payer: Self-pay | Admitting: Internal Medicine

## 2022-05-11 NOTE — Telephone Encounter (Signed)
03/16/2022 last seen

## 2022-05-11 NOTE — Telephone Encounter (Signed)
05/27/2022 next appointment.

## 2022-05-14 ENCOUNTER — Other Ambulatory Visit (INDEPENDENT_AMBULATORY_CARE_PROVIDER_SITE_OTHER): Payer: Self-pay | Admitting: Internal Medicine

## 2022-05-14 NOTE — Telephone Encounter (Signed)
Left message to return call to see why patient is requesting prednisone.

## 2022-05-14 NOTE — Telephone Encounter (Signed)
Last seen 03/16/2022, next appointment 05/27/2022.

## 2022-05-17 ENCOUNTER — Ambulatory Visit (HOSPITAL_COMMUNITY)
Admission: RE | Admit: 2022-05-17 | Discharge: 2022-05-17 | Disposition: A | Discharge status: Home / Self Care | Payer: 59 | Source: Ambulatory Visit | Attending: Internal Medicine | Admitting: Internal Medicine

## 2022-05-17 DIAGNOSIS — R112 Nausea with vomiting, unspecified: Secondary | ICD-10-CM | POA: Insufficient documentation

## 2022-05-17 DIAGNOSIS — K50819 Crohn's disease of both small and large intestine with unspecified complications: Secondary | ICD-10-CM

## 2022-05-17 DIAGNOSIS — R109 Unspecified abdominal pain: Secondary | ICD-10-CM

## 2022-05-17 MED ORDER — GLUCAGON HCL RDNA (DIAGNOSTIC) 1 MG IJ SOLR: 1.0000 mg | Freq: Once | INTRAMUSCULAR | Status: DC

## 2022-05-17 MED ORDER — GLUCAGON HCL RDNA (DIAGNOSTIC) 1 MG IJ SOLR
INTRAMUSCULAR | Status: AC
Start: 2022-05-17 — End: 2022-05-17
  Administered 2022-05-17: 1 mg via INTRAVENOUS
  Filled 2022-05-17: qty 1

## 2022-05-17 MED ORDER — GADOBUTROL 1 MMOL/ML IV SOLN
7.0000 mL | Freq: Once | INTRAVENOUS | Status: AC | PRN
  Administered 2022-05-17: 7 mL via INTRAVENOUS

## 2022-05-18 NOTE — Progress Notes (Signed)
CARDIOLOGY CONSULT NOTE       Patient ID: Elizabeth [name] MRN: [id number] DOB/AGE: 14-Aug-1961 61 y.o.  Admit date: (Not on file) Referring Physician: Quillian Quince Primary Physician: Caryl Bis, MD Primary Cardiologist: New Reason for Consultation: Edema/HTN  Active Problems:   * No active hospital problems. *   HPI:  61 y.o. referred by Dr Quillian Quince for edema and HTN.  History of obesity, GERD, DVT, smoking and Crohn's with diverticulitis and prior iliocecal resection No acute findings on x ray Abdominal pain since January CT 12/24/21 with transverse colonic ileus Dr Laural Golden has Rx her colitis with prednisone 05/14/22 She is also on Humira   Has right hip pain and received depomedrol injection with Dr Luna Glasgow 04/07/22   She has had lower extremity venous duplex 2020 with superficial phlebitis on right greater saphenous vein  With f/u 07/08/20 superficial subcutaneous edema on left Been on Xarelto chronically   BP Rx with Diovan HCT and she has had lasix 40 mg in past   Patient requested cardiac evaluation for labile BP, edema and dyspnea   Lab review Hct 42.7 LDL 140 TSH 2.65 BUN 21 Cr 0.94 K 4.8 Albumin 4  She smokes and refuses statin Rx BP in primary office 126/82 mmHg 04/15/22  Prescribed Wegovy for weight loss   She says she smokes only 1-2/day but smells of cigarettes on entering room Her weight and edema are worse on prednisone with increased appetite as well  Stress taking care of her 15 yo mother and husband with prostate cancer   ROS All other systems reviewed and negative except as noted above  Past Medical History:  Diagnosis Date   Bronchitis    C. difficile diarrhea    Cough 01/28/2014   upper airway cough syndrome   Crohn's disease ([place])    Diverticulitis    DVT 06/2020   history of   Elevated transaminase level    GERD (gastroesophageal reflux disease)    Hypertension    Obesity    Pneumonia 12/2013   Tobacco abuse     Family History  Problem Relation Age  of Onset   Healthy Mother    Lung cancer Father        smoked   Allergic Disorder Brother    Healthy Daughter     Social History   Socioeconomic History   Marital status: Married    Spouse name: Not on file   Number of children: Not on file   Years of education: Not on file   Highest education level: Not on file  Occupational History   Occupation: Wound Treatment Nurse    Employer: [employer]  Tobacco Use   Smoking status: Every Day    Packs/day: 0.25    Years: 40.00    Pack years: 10.00    Types: Cigarettes    Passive exposure: Current   Smokeless tobacco: Never   Tobacco comments:    increase smoking , very nervous  Vaping Use   Vaping Use: Never used  Substance and Sexual Activity   Alcohol use: Not Currently    Alcohol/week: 0.0 standard drinks    Comment: occ   Drug use: No   Sexual activity: Yes  Other Topics Concern   Not on file  Social History Narrative   Not on file   Social Determinants of Health   Financial Resource Strain: Not on file  Food Insecurity: Not on file  Transportation Needs: Not on file  Physical Activity: Not on file  Stress: Not on file  Social Connections: Not on file  Intimate Partner Violence: Not on file    Past Surgical History:  Procedure Laterality Date   ABDOMINAL HYSTERECTOMY  12/13/1978   APPENDECTOMY     BIOPSY  11/11/2021   Procedure: BIOPSY;  Surgeon: Rogene Houston, MD;  [facility];  Service: Endoscopy;;  bx of polyp and small bowel   COLON SURGERY     COLONOSCOPY     COLONOSCOPY N/A 08/16/2014   Procedure: COLONOSCOPY;  Surgeon: Rogene Houston, MD;  [facility];  Service: Endoscopy;  Laterality: N/A;  1200-rescheduled to 9/4 @ 10:45 Ann notified pt   COLONOSCOPY WITH PROPOFOL N/A 11/11/2021   Procedure: COLONOSCOPY WITH PROPOFOL;  Surgeon: Rogene Houston, MD;  [facility];  Service: Endoscopy;  Laterality: N/A;  2:05   ESOPHAGOGASTRODUODENOSCOPY  06/16/2012    Procedure: ESOPHAGOGASTRODUODENOSCOPY (EGD);  Surgeon: Rogene Houston, MD;  [facility];  Service: Endoscopy;  Laterality: N/A;  10:30 AM   hemocolectomy  12/14/2003   right   Illeocecal Resection  2005   INCISIONAL HERNIA REPAIR N/A 08/24/2021   Procedure: HERNIA REPAIR INCISIONAL WITH MESH;  Surgeon: Aviva Signs, MD;  Location: [facility];  Service: General;  Laterality: N/A;   LAPAROSCOPIC SIGMOID COLECTOMY N/A 01/07/2015   Procedure: LOW ANTERIOR COLON RESECTION;  Surgeon: Fanny Skates, MD;  Location: [place];  Service: General;  Laterality: N/A;   SHOULDER ARTHROSCOPY WITH LABRAL REPAIR Right 01/20/2016   Procedure: SHOULDER ARTHROSCOPY WITH LABRAL REPAIR;  Surgeon: Meredith Pel, MD;  Location: [place];  Service: Orthopedics;  Laterality: Right;      Current Outpatient Medications:    Adalimumab (HUMIRA PEN) 40 MG/0.4ML PNKT, Inject 40 mg into the skin once a week. Inject 56m into skin every 7 days, Disp: 4 each, Rfl: 5   albuterol (VENTOLIN HFA) 108 (90 Base) MCG/ACT inhaler, Inhale 2 puffs into the lungs every 6 (six) hours as needed for wheezing or shortness of breath., Disp: , Rfl:    [employer] MAGNESIUM OXIDE 250 MG TABS, TAKE 1 TABLET BY MOUTH 2 TIMES DAILY, Disp: 180 tablet, Rfl: 0   furosemide (LASIX) 40 MG tablet, Take 40 mg by mouth daily as needed for edema., Disp: , Rfl:    HYDROcodone-acetaminophen (NORCO) 10-325 MG tablet, One-half tablet every four hours as needed for pain., Disp: 10 tablet, Rfl: 0   mesalamine (PENTASA) 500 MG CR capsule, Take 1 capsule (500 mg total) by mouth 4 (four) times daily. (Patient taking differently: Take 1,000 mg by mouth 4 (four) times daily. 2 qid), Disp: 120 capsule, Rfl: 5   omeprazole (PRILOSEC) 40 MG capsule, Take 40 mg by mouth daily before breakfast., Disp: , Rfl:    ondansetron (ZOFRAN-ODT) 8 MG disintegrating tablet, TAKE 1 TABLET (8 MG TOTAL) BY MOUTH 2 (TWO) TIMES DAILY AS NEEDED., Disp: 18 tablet, Rfl: 3   potassium  chloride (MICRO-K) 10 MEQ CR capsule, Take 10 mEq by mouth 2 (two) times daily. , Disp: , Rfl:    promethazine (PHENERGAN) 25 MG tablet, Take one bid prn (Patient taking differently: Take 25 mg by mouth every 6 (six) hours as needed for nausea or vomiting. Take one bid prn), Disp: 20 tablet, Rfl: 0   rivaroxaban (XARELTO) 20 MG TABS tablet, Take 1 tablet (20 mg total) by mouth daily with supper., Disp: 30 tablet, Rfl:    valsartan-hydrochlorothiazide (DIOVAN-HCT) 160-12.5 MG per tablet, Take 1 tablet by mouth  daily. , Disp: , Rfl:    diphenhydrAMINE (BENADRYL) 50 MG tablet, Take 1 tablet (50 mg total) by mouth at bedtime as needed for itching (take 1 capsule 1 hour prior to scan). (Patient not taking: Reported on 05/19/2022), Disp: 1 tablet, Rfl: 0   fluticasone (FLONASE) 50 MCG/ACT nasal spray, Place 2 sprays into both nostrils daily as needed for allergies or rhinitis. (Patient not taking: Reported on 05/19/2022), Disp: , Rfl:    predniSONE (DELTASONE) 10 MG tablet, TAKE 3 TABS DAILY X 7 DAYS, 2 DAILY FOR 7 DAYS, 1 DAILY FOR 7 DAYS, 1/2 TAB DAILY X 7 DAYS (Patient not taking: Reported on 05/19/2022), Disp: 46 tablet, Rfl: 0   predniSONE (DELTASONE) 50 MG tablet, Take one tablet 13 hours prior take 1 tablet 7 hours prior take 1 tablet 1 hour prior (Patient not taking: Reported on 05/19/2022), Disp: 3 tablet, Rfl: 0    Physical Exam: Ht 5' 7"  (1.702 m)   Wt 220 lb (99.8 kg)   BMI 34.46 kg/m    Affect appropriate Overweight female  HEENT: normal Neck supple with no adenopathy JVP normal no bruits no thyromegaly Lungs clear with no wheezing and good diaphragmatic motion Heart:  S1/S2 no murmur, no rub, gallop or click PMI normal Abdomen: benighn, BS positve, no tenderness, no AAA no bruit.  No HSM or HJR Distal pulses intact with no bruits Plus one  edema Neuro non-focal Skin warm and dry No muscular weakness   Labs:   Lab Results  Component Value Date   WBC 7.1 01/22/2022   HGB 14.4  01/22/2022   HCT 40.4 01/22/2022   MCV 104.1 (H) 01/22/2022   PLT 188 01/22/2022   No results for input(s): NA, K, CL, CO2, BUN, CREATININE, CALCIUM, PROT, BILITOT, ALKPHOS, ALT, AST, GLUCOSE in the last 168 hours.  Invalid input(s): LABALBU No results found for: CKTOTAL, CKMB, CKMBINDEX, TROPONINI No results found for: CHOL No results found for: HDL No results found for: LDLCALC No results found for: TRIG No results found for: CHOLHDL No results found for: LDLDIRECT    Radiology: MR ENTERO ABDOMEN W WO CONTRAST  Result Date: 05/18/2022 CLINICAL DATA:  Crohn disease with nausea vomiting.  Abdominal pain. EXAM: MR ABDOMEN AND PELVIS WITHOUT AND WITH CONTRAST (MR ENTEROGRAPHY) TECHNIQUE: Multiplanar, multisequence MRI of the abdomen and pelvis was performed both before and during bolus administration of intravenous contrast. Negative oral contrast VoLumen was given. CONTRAST:  46m GADAVIST GADOBUTROL 1 MMOL/ML IV SOLN COMPARISON:  Abdomen/pelvis CT 12/24/2021 FINDINGS: COMBINED FINDINGS FOR BOTH MR ABDOMEN AND PELVIS Lower chest: Unremarkable. Hepatobiliary: No suspicious focal abnormality within the liver parenchyma. There is no evidence for gallstones, gallbladder wall thickening, or pericholecystic fluid. No intrahepatic or extrahepatic biliary dilation. Pancreas: No focal mass lesion. No dilatation of the main duct. No intraparenchymal cyst. No peripancreatic edema. Spleen:  No splenomegaly. No focal mass lesion. Adrenals/Urinary Tract: No adrenal nodule or mass. Kidneys unremarkable. No evidence for hydroureter. The urinary bladder appears normal for the degree of distention. Stomach/Bowel: Stomach is unremarkable. No gastric wall thickening. No evidence of outlet obstruction. Duodenum is normally positioned as is the ligament of Treitz. Duodenal diverticulum noted. No abnormal mucosal or transmural hyperenhancement in the duodenum. Jejunal loops are unremarkable. Patient is status post  ileoceectomy. Neo terminal ileum is nondilated. There is mild mucosal hyperenhancement in the neo terminal ileum (postcontrast axial images 91-109/16). No substantial perienteric edema or inflammation in this region. Scattered areas of mucosal hyperenhancement are seen in  the colon including proximal transverse segment (48/16) distal transverse segment (50/16) and notably in the distal descending colon where there is apparent increased enhancing soft tissue in the mucosal/submucosal region, borderline for transmural hyperenhancement (see image 104/16). These changes are just proximal to the redundant/patulous rectosigmoid anastomosis. No substantial pericolonic edema/inflammation in this region. Vascular/Lymphatic: No abdominal aortic aneurysm. No abdominal lymphadenopathy. No pelvic lymphadenopathy. Reproductive: Hysterectomy.  There is no adnexal mass. Other:  No intraperitoneal free fluid. Musculoskeletal: No focal suspicious marrow enhancement within the visualized bony anatomy. There is a tiny right paraumbilical hernia containing only fat, better visualized on CT scan from 12/24/2021. IMPRESSION: 1. Mucosal hyperenhancement in the neo terminal ileum, compatible with active inflammation. No substantial perienteric edema or inflammation in this region. No findings to suggest fistula or abscess. There is no dilatation of neo terminal ileum proximal to the area of mucosal hyperenhancement to suggest inflammatory stricture. 2. Scattered areas of mucosal hyperenhancement in the colon including the proximal transverse segment, distal transverse segment, and distal descending colon. Abnormal enhancement is most prominent in the distal descending colon with questionable transmural hyperenhancement just proximal to the redundant/patulous rectosigmoid anastomosis. Imaging findings are compatible with active inflammation. No colonic dilatation proximal to this region to suggest inflammatory stricture. Electronically  Signed   By: Misty Stanley M.D.   On: 05/18/2022 16:26   MR ENTERO PELVIS W WO CONTRAST  Result Date: 05/18/2022 CLINICAL DATA:  Crohn disease with nausea vomiting.  Abdominal pain. EXAM: MR ABDOMEN AND PELVIS WITHOUT AND WITH CONTRAST (MR ENTEROGRAPHY) TECHNIQUE: Multiplanar, multisequence MRI of the abdomen and pelvis was performed both before and during bolus administration of intravenous contrast. Negative oral contrast VoLumen was given. CONTRAST:  97m GADAVIST GADOBUTROL 1 MMOL/ML IV SOLN COMPARISON:  Abdomen/pelvis CT 12/24/2021 FINDINGS: COMBINED FINDINGS FOR BOTH MR ABDOMEN AND PELVIS Lower chest: Unremarkable. Hepatobiliary: No suspicious focal abnormality within the liver parenchyma. There is no evidence for gallstones, gallbladder wall thickening, or pericholecystic fluid. No intrahepatic or extrahepatic biliary dilation. Pancreas: No focal mass lesion. No dilatation of the main duct. No intraparenchymal cyst. No peripancreatic edema. Spleen:  No splenomegaly. No focal mass lesion. Adrenals/Urinary Tract: No adrenal nodule or mass. Kidneys unremarkable. No evidence for hydroureter. The urinary bladder appears normal for the degree of distention. Stomach/Bowel: Stomach is unremarkable. No gastric wall thickening. No evidence of outlet obstruction. Duodenum is normally positioned as is the ligament of Treitz. Duodenal diverticulum noted. No abnormal mucosal or transmural hyperenhancement in the duodenum. Jejunal loops are unremarkable. Patient is status post ileoceectomy. Neo terminal ileum is nondilated. There is mild mucosal hyperenhancement in the neo terminal ileum (postcontrast axial images 91-109/16). No substantial perienteric edema or inflammation in this region. Scattered areas of mucosal hyperenhancement are seen in the colon including proximal transverse segment (48/16) distal transverse segment (50/16) and notably in the distal descending colon where there is apparent increased enhancing  soft tissue in the mucosal/submucosal region, borderline for transmural hyperenhancement (see image 104/16). These changes are just proximal to the redundant/patulous rectosigmoid anastomosis. No substantial pericolonic edema/inflammation in this region. Vascular/Lymphatic: No abdominal aortic aneurysm. No abdominal lymphadenopathy. No pelvic lymphadenopathy. Reproductive: Hysterectomy.  There is no adnexal mass. Other:  No intraperitoneal free fluid. Musculoskeletal: No focal suspicious marrow enhancement within the visualized bony anatomy. There is a tiny right paraumbilical hernia containing only fat, better visualized on CT scan from 12/24/2021. IMPRESSION: 1. Mucosal hyperenhancement in the neo terminal ileum, compatible with active inflammation. No substantial perienteric edema or inflammation in this  region. No findings to suggest fistula or abscess. There is no dilatation of neo terminal ileum proximal to the area of mucosal hyperenhancement to suggest inflammatory stricture. 2. Scattered areas of mucosal hyperenhancement in the colon including the proximal transverse segment, distal transverse segment, and distal descending colon. Abnormal enhancement is most prominent in the distal descending colon with questionable transmural hyperenhancement just proximal to the redundant/patulous rectosigmoid anastomosis. Imaging findings are compatible with active inflammation. No colonic dilatation proximal to this region to suggest inflammatory stricture. Electronically Signed   By: Misty Stanley M.D.   On: 05/18/2022 16:26    EKG: SR rate 88 ICRBBB low voltage    ASSESSMENT AND PLAN:   HTN:  related to obesity and steroid use has been on ARB/diuretic and encouraged her to take this with PRN lasix Her home BP readings are fine  Edema:  peripheral related to previous phlebitis, steroid use and obesity F/U echo to assess RV/LV EF r/o pulmonary HTN    HLD:  refused statin discussed utility of calcium score to  risk stratify for CAD Crohn's post surgical ilieocolectomy with Humira, Pentasa  and prednisone use CT with ileus MRI pending  DVT:  on xarelto f/u primary labs ok  6.  Smoking:  counseled on cessation < 10 minutes lung cancer CT   Calcium score Echo Lung cancer CT  F/U PRN pending results   Signed: Jenkins Rouge 05/19/2022, 9:04 AM

## 2022-05-19 ENCOUNTER — Ambulatory Visit: Payer: 59 | Admitting: Cardiovascular Disease

## 2022-05-19 ENCOUNTER — Encounter: Payer: Self-pay | Admitting: Cardiovascular Disease

## 2022-05-19 ENCOUNTER — Other Ambulatory Visit (INDEPENDENT_AMBULATORY_CARE_PROVIDER_SITE_OTHER): Payer: Self-pay

## 2022-05-19 ENCOUNTER — Other Ambulatory Visit (INDEPENDENT_AMBULATORY_CARE_PROVIDER_SITE_OTHER): Payer: Self-pay | Admitting: *Deleted

## 2022-05-19 ENCOUNTER — Other Ambulatory Visit: Payer: Self-pay | Admitting: *Deleted

## 2022-05-19 VITALS — BP 130/80 | HR 80 | Ht 67.0 in | Wt 220.0 lb

## 2022-05-19 DIAGNOSIS — I10 Essential (primary) hypertension: Secondary | ICD-10-CM

## 2022-05-19 DIAGNOSIS — F17200 Nicotine dependence, unspecified, uncomplicated: Secondary | ICD-10-CM | POA: Diagnosis not present

## 2022-05-19 DIAGNOSIS — K50118 Crohn's disease of large intestine with other complication: Secondary | ICD-10-CM

## 2022-05-19 DIAGNOSIS — R0600 Dyspnea, unspecified: Secondary | ICD-10-CM | POA: Diagnosis not present

## 2022-05-19 DIAGNOSIS — R609 Edema, unspecified: Secondary | ICD-10-CM

## 2022-05-19 DIAGNOSIS — R600 Localized edema: Secondary | ICD-10-CM

## 2022-05-19 NOTE — Patient Instructions (Signed)
Medication Instructions:  Your physician recommends that you continue on your current medications as directed. Please refer to the Current Medication list given to you today.  *If you need a refill on your cardiac medications before your next appointment, please call your pharmacy*   Lab Work: NONE   If you have labs (blood work) drawn today and your tests are completely normal, you will receive your results only by: [place] [facility] (if you have MyChart) OR A paper copy in the mail If you have any lab test that is abnormal or we need to change your treatment, we will call you to review the results.   Testing/Procedures: Your physician has requested that you have an echocardiogram. Echocardiography is a painless test that uses sound waves to create images of your heart. It provides your doctor with information about the size and shape of your heart and how well your heart's chambers and valves are working. This procedure takes approximately one hour. There are no restrictions for this procedure.  Lung cancer Screening  Calcium Score    Follow-Up: At [facility], you and your health needs are our priority.  As part of our continuing mission to provide you with exceptional heart care, we have created designated Provider Care Teams.  These Care Teams include your primary Cardiologist (physician) and Advanced Practice Providers (APPs -  Physician Assistants and Nurse Practitioners) who all work together to provide you with the care you need, when you need it.  We recommend signing up for the patient portal called "MyChart".  Sign up information is provided on this After Visit Summary.  MyChart is used to connect with patients for Virtual Visits (Telemedicine).  Patients are able to view lab/test results, encounter notes, upcoming appointments, etc.  Non-urgent messages can be sent to your provider as well.   To learn more about what you can do with MyChart, go to [email or web address].     Your next appointment:    As Needed   The format for your next appointment:   In Person  Provider:   Jenkins Rouge, MD    Other Instructions Thank you for choosing [facility]!    Important Information About Sugar

## 2022-05-21 ENCOUNTER — Telehealth: Payer: Self-pay | Admitting: Pharmacy Technician

## 2022-05-21 NOTE — Telephone Encounter (Signed)
Auth Submission: pending Payer: friday Medication & CPT/J Code(s) submitted: skyrizi [phone] Route of submission (phone, fax, portal): PHONE: [phone] FAX; [phone] Auth type: Buy/Bill Units/visits requested:600MG X 3 DOSES Reference number:  Approval from:  to

## 2022-05-22 ENCOUNTER — Other Ambulatory Visit (INDEPENDENT_AMBULATORY_CARE_PROVIDER_SITE_OTHER): Payer: Self-pay | Admitting: Internal Medicine

## 2022-05-22 MED ORDER — CIPROFLOXACIN HCL 500 MG PO TABS: 500.0000 mg | ORAL_TABLET | Freq: Two times a day (BID) | ORAL | 0 refills | Status: DC

## 2022-05-22 MED ORDER — METRONIDAZOLE 500 MG PO TABS: 500.0000 mg | ORAL_TABLET | Freq: Three times a day (TID) | ORAL | 0 refills | Status: DC

## 2022-05-22 NOTE — Telephone Encounter (Signed)
I reviewed MR enterography with Dr. Barnie Alderman yesterday afternoon. Not sure if fluid collection is small abscess.  There is no haziness or inflammatory changes surrounding this fluid collection. Since patient is still having pain will start on Cipro and metronidazole. She has office visit next week.

## 2022-05-24 ENCOUNTER — Encounter: Payer: Self-pay | Admitting: Internal Medicine

## 2022-05-24 NOTE — Telephone Encounter (Addendum)
Dr. Laural Golden, Orson Ape has been approved.   Auth Submission: APPROVED Payer: FRIDAY Medication & CPT/J Code(s) submitted: CUNMMGY Y8835 Route of submission (phone, fax, portal): PHONE: [phone] Auth type: Buy/Bill Units/visits requested: 3 DOSES Reference number: [id number] Approval from: 05/21/22 to 08/21/22   [facility] co-pay card: APPROVED DEBIT CARD: [id number] EXP: 6/28 CVV: 336 ID: [id number] GR: [id number] BIN: [id number] PCN: [id number]  PHONE: [phone]  For debit card info: [phone]

## 2022-05-27 ENCOUNTER — Encounter (INDEPENDENT_AMBULATORY_CARE_PROVIDER_SITE_OTHER): Payer: Self-pay | Admitting: Internal Medicine

## 2022-05-27 ENCOUNTER — Encounter: Payer: Self-pay | Admitting: Orthopaedic Surgery

## 2022-05-27 ENCOUNTER — Other Ambulatory Visit (HOSPITAL_COMMUNITY): Payer: 59

## 2022-05-27 ENCOUNTER — Ambulatory Visit (INDEPENDENT_AMBULATORY_CARE_PROVIDER_SITE_OTHER): Payer: 59 | Admitting: Internal Medicine

## 2022-05-27 ENCOUNTER — Ambulatory Visit (INDEPENDENT_AMBULATORY_CARE_PROVIDER_SITE_OTHER): Payer: 59 | Admitting: Orthopaedic Surgery

## 2022-05-27 VITALS — Ht 67.0 in | Wt 220.0 lb

## 2022-05-27 VITALS — BP 123/81 | HR 111 | Temp 97.7°F | Ht 67.0 in | Wt 218.7 lb

## 2022-05-27 DIAGNOSIS — K50818 Crohn's disease of both small and large intestine with other complication: Secondary | ICD-10-CM | POA: Diagnosis not present

## 2022-05-27 DIAGNOSIS — M7061 Trochanteric bursitis, right hip: Secondary | ICD-10-CM

## 2022-05-27 DIAGNOSIS — M7062 Trochanteric bursitis, left hip: Secondary | ICD-10-CM | POA: Diagnosis not present

## 2022-05-27 MED ORDER — HYDROCODONE-ACETAMINOPHEN 10-325 MG PO TABS: ORAL_TABLET | ORAL | 0 refills | Status: DC

## 2022-05-27 NOTE — Patient Instructions (Signed)
Stay on prednisone 40 mg daily under CT scan done on Monday after which she was in start dropping dose per schedule which is dropped dose by 5 mg every week. Physician or office will call with results of CT when completed.

## 2022-05-27 NOTE — Progress Notes (Signed)
PROCEDURE NOTE:  The patient request injection, verbal consent was obtained.  The right trochanteric area of the hip was prepped appropriately after time out was performed.   Sterile technique was observed and injection of 1 cc of Celestone 6 mg with several cc's of plain xylocaine. Anesthesia was provided by ethyl chloride and a 20-gauge needle was used to inject the hip area. The injection was tolerated well.  A band aid dressing was applied.  The patient was advised to apply ice later today and tomorrow to the injection sight as needed.  PROCEDURE NOTE:  The patient request injection, verbal consent was obtained.  The left trochanteric area of the hip was prepped appropriately after time out was performed.   Sterile technique was observed and injection of 1 cc of Celestone 6 mg with several cc's of plain xylocaine. Anesthesia was provided by ethyl chloride and a 20-gauge needle was used to inject the hip area. The injection was tolerated well.  A band aid dressing was applied.  The patient was advised to apply ice later today and tomorrow to the injection sight as needed.  Encounter Diagnoses  Name Primary?   Trochanteric bursitis, right hip Yes   Trochanteric bursitis, left hip    I have reviewed the [place] web site prior to prescribing narcotic medicine for this patient.  Return as needed.  Call if any problem.  Precautions discussed.  Electronically Signed Sanjuana Kava, MD 6/15/202310:36 AM

## 2022-05-27 NOTE — Progress Notes (Signed)
Presenting complaint;  Follow-up for Crohn disease.  Database and subjective:  Patient is 61 year old Caucasian female who is here for scheduled visit.  She was last seen in the office on 03/16/2022. She was not doing well while on Humira/adalimumab at a dose of 40 mg every [redacted] weeks along with oral mesalamine.  Fecal calprotectin and colonoscopy.  Biologic dose was changed to 40 mg every week which she has been for 8 weeks.  Prednisone dose was tapered. Patient called our office 1 month ago complaining of lower abdominal cramps and severe pain as well as emesis and loose bowel movement.  Patient's symptoms persisted.  Therefore she was advised to go back on prednisone and plans were made for her to have MR enterography.  She noted some improvement in her pain and cramping with prednisone. MRI was performed on 05/18/2022 and reveals mucosal hyperenhancement to neoterminal ileum consistent with active disease.  There was no evidence of small bowel dilation.  She also had scattered areas of mucosal hyperenhancement in the colon and proximal transverse colon segment, distal transverse colon segment as well as distal descending colon.  This study also revealed an area with transmural hyperenhancement proximal to rectosigmoid anastomosis. I reviewed the results with patient over the weekend she was still having pain in severe cramping.  I was concerned that this may be a small abscess and she was therefore begun on Cipro and metronidazole on 05/22/2022. In the meantime I did review study with Dr. Misty Stanley who felt this could be small abscess but typical changes surrounding this area were not seen. Patient is also advised to keep a stool diary and stay away from foods that trigger her symptoms. Patient states she is feeling lot better.  She has had no pain or cramping for the last 3 days.  She has quit drinking coffee and drinks as well as spicy foods.  She also has cut back on calorie intake as she is trying to  lose weight.  Review of food diary reveals limited food intake.  For example on June 13 her breakfast included half a piece of toast and water gravy and lunch was 1 piece of pizza at supper she had baked chicken leg green beans tomatoes and cucumbers.  On June 10 she had half a biscuit or gravy and lunch she had a plain crackers and supper included fresh salmon, asparagus and 2 gingerales. She says she is not going to smoke anymore. She is not having 2 soft formed stools daily.  She denies melena or rectal bleeding.  She also has not had fever or chills.  She has been experiencing bilateral hip pain due to bursitis and she had shots by Dr. Luna Glasgow today to each side and noticed improvement.  Brief GI history  She has had Crohn disease of small and large bowel in 2003. She had ileocecectomy at [facility] in 2005. She has taken 6-MP in the past but it was discontinued due to bump in her transaminases. She also has history of recurrent sigmoid diverticulitis and she had sigmoid colon resection in January 2016. I had asked Dr. Fanny Skates to also evaluate for small bowel and he felt she was in remission. Last colonoscopy was in November 2022 revealing active disease involving neoterminal ileum with noncritical stricture.  Biopsy from inflamed mucosa at descending colon revealed hyperplastic changes and raise the possibility of sessile serrated polyp. She has chronic GERD. She also has IBS.    Current Medications: Outpatient Encounter Medications  as of 05/27/2022  Medication Sig   Adalimumab (HUMIRA PEN) 40 MG/0.4ML PNKT Inject 40 mg into the skin once a week. Inject 59m into skin every 7 days   albuterol (VENTOLIN HFA) 108 (90 Base) MCG/ACT inhaler Inhale 2 puffs into the lungs every 6 (six) hours as needed for wheezing or shortness of breath.   Ascorbic Acid (VITAMIN C) 1000 MG tablet Take 1,000 mg by mouth daily.   ciprofloxacin (CIPRO) 500 MG tablet Take 1 tablet (500 mg total) by mouth  2 (two) times daily.   ERGOCALCIFEROL PO Take by mouth. 2,000 units per day.   fluticasone (FLONASE) 50 MCG/ACT nasal spray Place 2 sprays into both nostrils daily as needed for allergies or rhinitis.   furosemide (LASIX) 40 MG tablet Take 40 mg by mouth daily as needed for edema.   HYDROcodone-acetaminophen (NORCO) 10-325 MG tablet One-half tablet every four hours as needed for pain.   mesalamine (PENTASA) 500 MG CR capsule Take 1 capsule (500 mg total) by mouth 4 (four) times daily. (Patient taking differently: Take 1,000 mg by mouth 4 (four) times daily. 2 qid)   metroNIDAZOLE (FLAGYL) 500 MG tablet Take 1 tablet (500 mg total) by mouth 3 (three) times daily.   omeprazole (PRILOSEC) 40 MG capsule Take 40 mg by mouth daily before breakfast.   ondansetron (ZOFRAN-ODT) 8 MG disintegrating tablet TAKE 1 TABLET (8 MG TOTAL) BY MOUTH 2 (TWO) TIMES DAILY AS NEEDED.   potassium chloride (MICRO-K) 10 MEQ CR capsule Take 10 mEq by mouth 2 (two) times daily.    predniSONE (DELTASONE) 10 MG tablet Take 20 mg by mouth daily with breakfast.   promethazine (PHENERGAN) 25 MG tablet Take one bid prn (Patient taking differently: Take 25 mg by mouth every 6 (six) hours as needed for nausea or vomiting. Take one bid prn)   Risankizumab-rzaa (SKYRIZI IV) Inject into the vein. [facility]   rivaroxaban (XARELTO) 20 MG TABS tablet Take 1 tablet (20 mg total) by mouth daily with supper.   valsartan-hydrochlorothiazide (DIOVAN-HCT) 160-12.5 MG per tablet Take 1 tablet by mouth daily.    Zinc Sulfate (ZINC-220 PO) Take by mouth daily at 6 (six) AM.   [DISCONTINUED] predniSONE (DELTASONE) 50 MG tablet Take one tablet 13 hours prior take 1 tablet 7 hours prior take 1 tablet 1 hour prior   [DISCONTINUED] [facility] MAGNESIUM OXIDE 250 MG TABS TAKE 1 TABLET BY MOUTH 2 TIMES DAILY   [DISCONTINUED] diphenhydrAMINE (BENADRYL) 50 MG tablet Take 1 tablet (50 mg total) by mouth at  bedtime as needed for itching (take 1 capsule 1 hour prior to scan). (Patient not taking: Reported on 05/19/2022)   [DISCONTINUED] predniSONE (DELTASONE) 10 MG tablet TAKE 3 TABS DAILY X 7 DAYS, 2 DAILY FOR 7 DAYS, 1 DAILY FOR 7 DAYS, 1/2 TAB DAILY X 7 DAYS (Patient not taking: Reported on 05/19/2022)   No facility-administered encounter medications on file as of 05/27/2022.     Objective: Blood pressure 123/81, pulse (!) 111, temperature 97.7 F (36.5 C), temperature source Oral, height _0  (1.702 m), weight 218 lb 11.2 oz (99.2 kg). Patient is alert and in no acute distress. Conjunctiva is pink. Sclera is nonicteric Oropharyngeal mucosa is normal. No neck masses or thyromegaly noted. Cardiac exam with regular rhythm normal S1 and S2. No murmur or gallop noted. Lungs are clear to auscultation. Abdomen is full.  She has lower midline scar.  Bowel sounds are normal.  On palpation abdomen is soft and nontender with organomegaly or masses. No LE edema or clubbing noted. 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  Labs/studies Results:      Latest Ref Rng & Units 01/22/2022   11:02 AM 12/27/2021    4:49 AM 12/26/2021    4:05 AM  CBC  WBC 3.8 - 10.8 Thousand/uL 7.1  10.3  6.8   Hemoglobin 11.7 - 15.5 g/dL 14.4  12.6  12.9   Hematocrit 35.0 - 45.0 % 40.4  36.5  37.6   Platelets 140 - 400 Thousand/uL 188  256  247        Latest Ref Rng & Units 03/24/2022    9:56 AM 01/22/2022   11:02 AM 12/27/2021    4:49 AM  CMP  Glucose 65 - 99 mg/dL 96  98  111   BUN 7 - 25 mg/dL _0 Creatinine 0.50 - 1.05 mg/dL 0.99  0.91  0.86   Sodium 135 - 146 mmol/L 139  140  138   Potassium 3.5 - 5.3 mmol/L 3.6  3.7  4.3   Chloride 98 - 110 mmol/L 105  108  105   CO2 20 - 32 mmol/L _1 Calcium 8.6 - 10.4 mg/dL 8.8  8.5  8.7   Total Protein 6.1 - 8.1 g/dL  6.1  5.7   Total Bilirubin 0.2 - 1.2 mg/dL  0.7  0.7   Alkaline Phos 38 - 126 U/L   65   AST 10 - 35 U/L  21  53   ALT 6 - 29  U/L  24  60        Latest Ref Rng & Units 01/22/2022   11:02 AM 12/27/2021    4:49 AM 12/26/2021    4:05 AM  Hepatic Function  Total Protein 6.1 - 8.1 g/dL 6.1  5.7  5.5   Albumin 3.5 - 5.0 g/dL  3.0  2.9   AST 10 - 35 U/L 21  53  52   ALT 6 - 29 U/L 24  60  40   Alk Phosphatase 38 - 126 U/L  65  51   Total Bilirubin 0.2 - 1.2 mg/dL 0.7  0.7  0.8     Lab Results  Component Value Date   CRP 5.8 01/22/2022      Assessment:  #1.  Crohn's disease.  Patient has not responded to weekly Humira/adalimumab which she has taken for 8 or 9 weeks.  MR enterography last week revealed active disease in neoterminal ileum which is also documented on colonoscopy of November 2022.  MR enterography also reveals segmental areas in the colon with hyper enhancement.  Therefore Humira/adalimumab is being discontinued and she is being switched to Skyrizi/risankizumab.  She will receive IV infusion next week, followed by dose at 4 weeks and then 8 weeks and thereafter she will be on maintenance dose of 360 mg SQ every 12 and then every 8 weeks. She will need colonoscopy at some point this year when she is deemed to be in remission.  #2.  Focal area of hyperenhancement involving medial wall of descending colon proximal to colonic anastomosis concerning for small abscess as her pain has resolved since she has been on antibiotics.  Follow-up study is indicated to confirm that this collection is stable and or has resolved.  #3.  GERD.  She is doing well with therapy.  #4.  Obesity.  BMI is over 34.  She is fully aware of the need for to lose weight.  She already has quit drinking sodas and has cut back on calorie intake.  Plan:  CT abdomen and pelvis with contrast.  She has a history of rash secondary to iodine dye but she has not had any side effects with prednisone and Benadryl.  She is already on prednisone.  She will take Benadryl before the studies done. She will start dropping prednisone dose day after the  CT is done by 5 mg every week. Once again patient congratulated and encouraged on her decision to quit cigarette smoking which would help management of her Crohn's disease. Office visit with Dr. Jenetta Downer in 2 months.

## 2022-05-31 ENCOUNTER — Other Ambulatory Visit (INDEPENDENT_AMBULATORY_CARE_PROVIDER_SITE_OTHER): Payer: Self-pay

## 2022-05-31 ENCOUNTER — Telehealth (INDEPENDENT_AMBULATORY_CARE_PROVIDER_SITE_OTHER): Payer: Self-pay

## 2022-05-31 DIAGNOSIS — Z01812 Encounter for preprocedural laboratory examination: Secondary | ICD-10-CM

## 2022-05-31 DIAGNOSIS — K50818 Crohn's disease of both small and large intestine with other complication: Secondary | ICD-10-CM

## 2022-05-31 IMAGING — CT CT ABD-PELV W/O CM
2 of 4 series · 16 of 46 positions shown, 18 images · non-contrast
Comparison: 06/05/2020

CLINICAL DATA: Crohn's exacerbation

EXAM:
CT ABDOMEN AND PELVIS WITHOUT CONTRAST
TECHNIQUE: Multidetector CT imaging of the abdomen and pelvis was performed
following the standard protocol without IV contrast.

[Series 2: axial st · axial · 0.91mm/px · z∈[+829,+1339]mm · 13 of 114 slices shown, 15 images]
[im 6/114  soft-tissue]
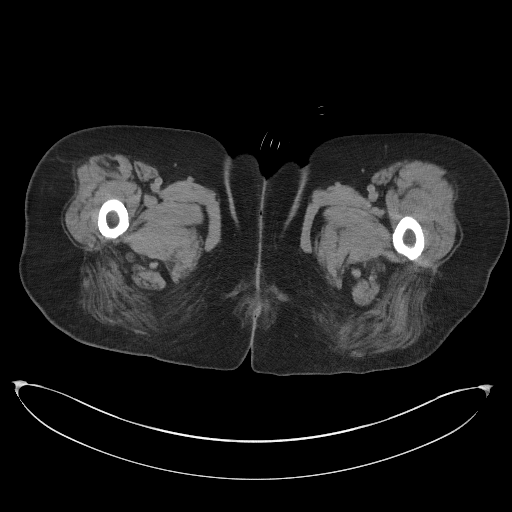
[im 6/114  bone]
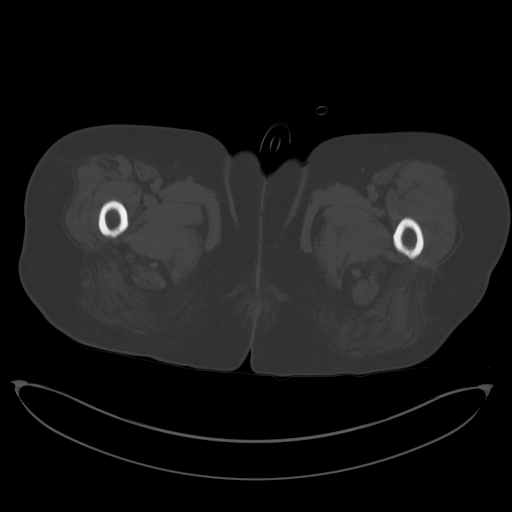
[im 18/114  soft-tissue]
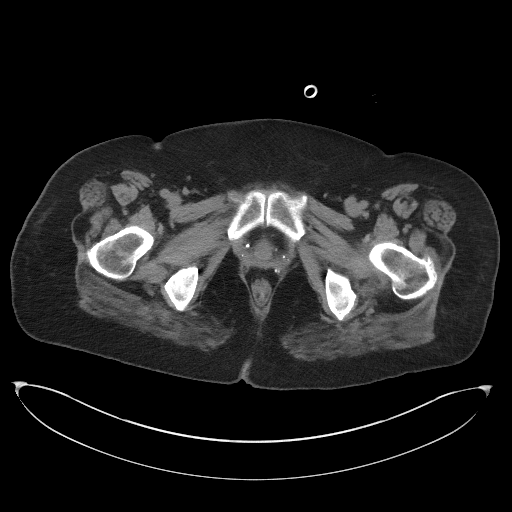
[im 24/114  soft-tissue]
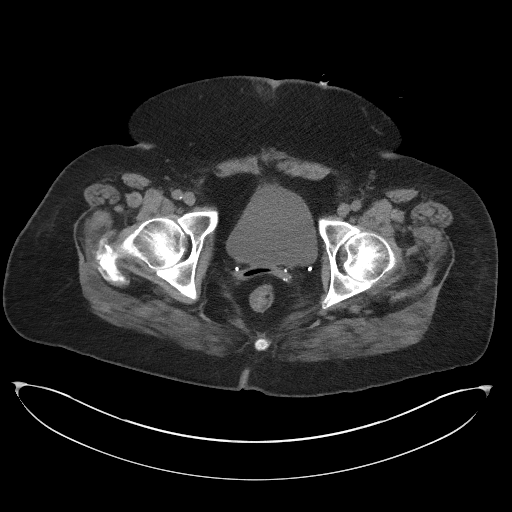
[im 30/114  soft-tissue]
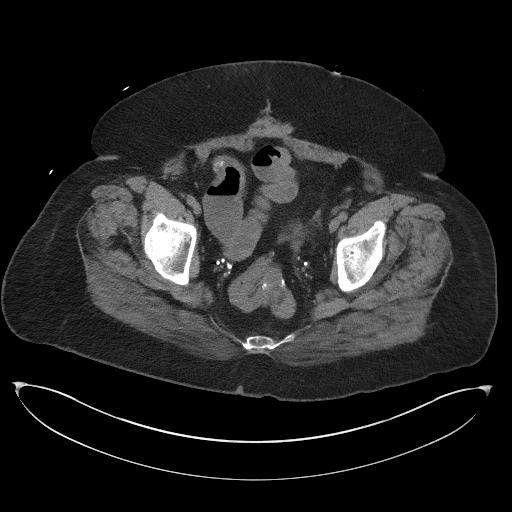
[im 42/114  soft-tissue]
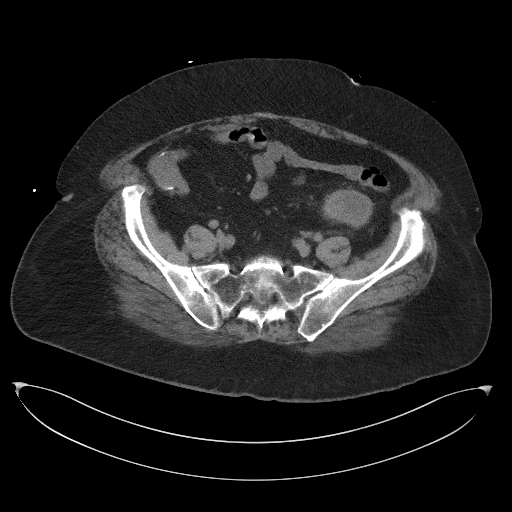
[im 48/114  soft-tissue]
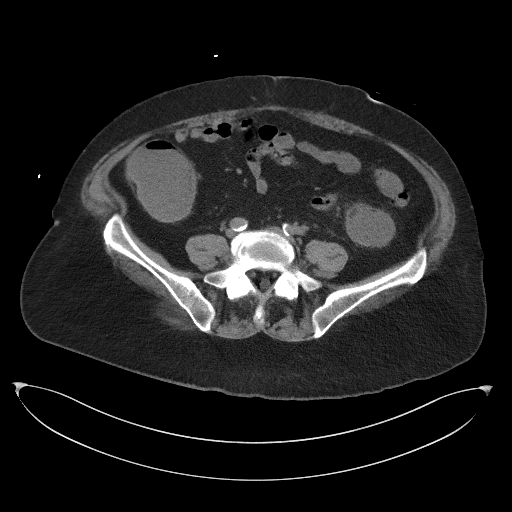
[im 60/114  soft-tissue]
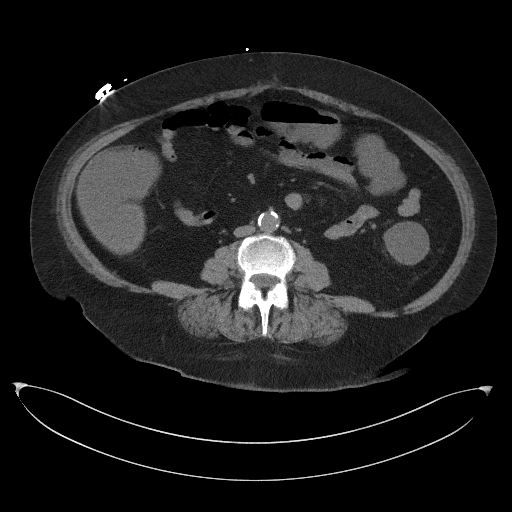
[im 66/114  soft-tissue]
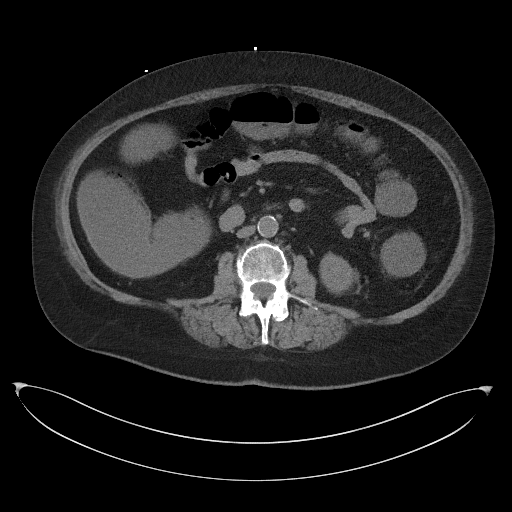
[im 72/114  soft-tissue]
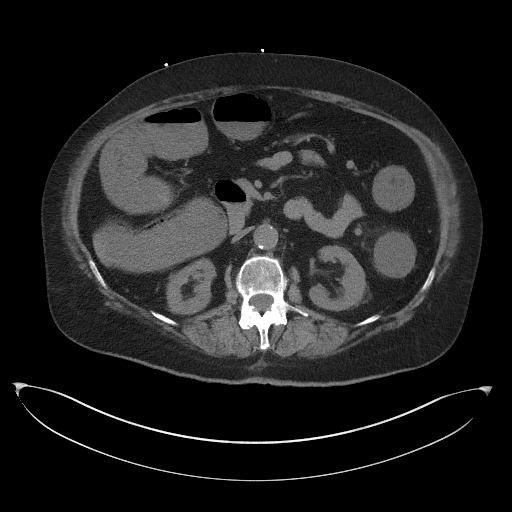
[im 72/114  bone]
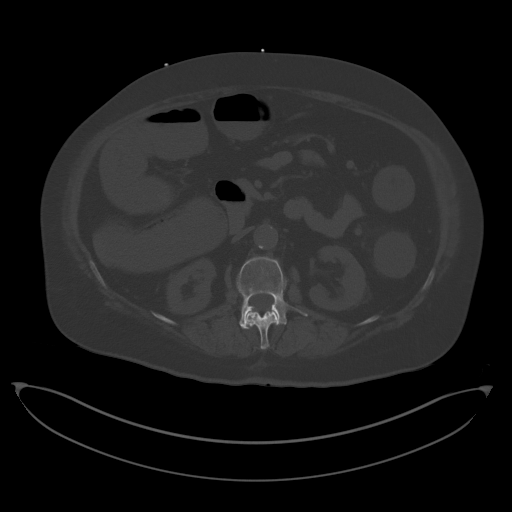
[im 84/114  soft-tissue]
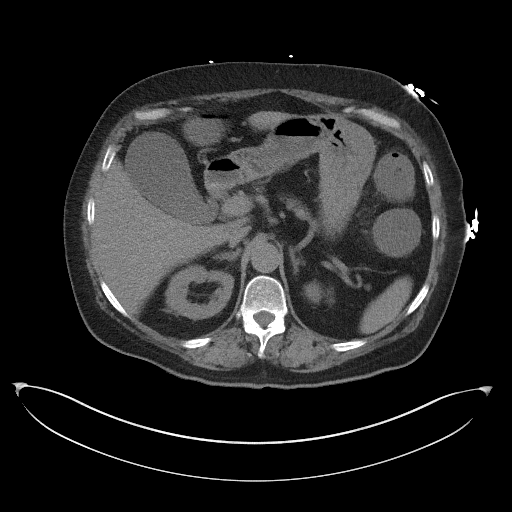
[im 90/114  soft-tissue]
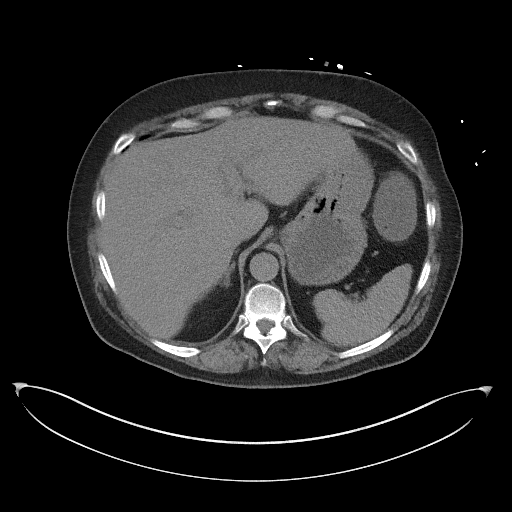
[im 96/114  soft-tissue]
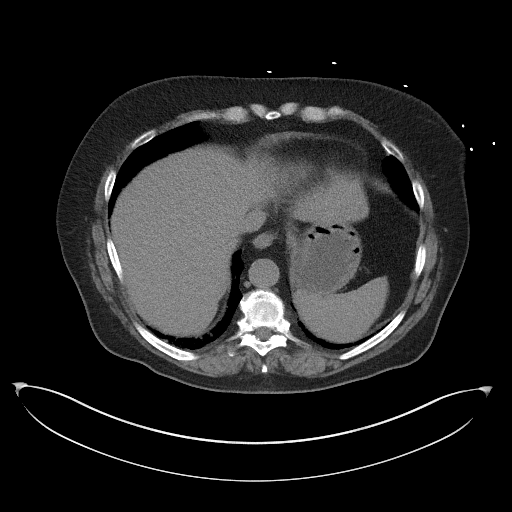
[im 108/114  soft-tissue]
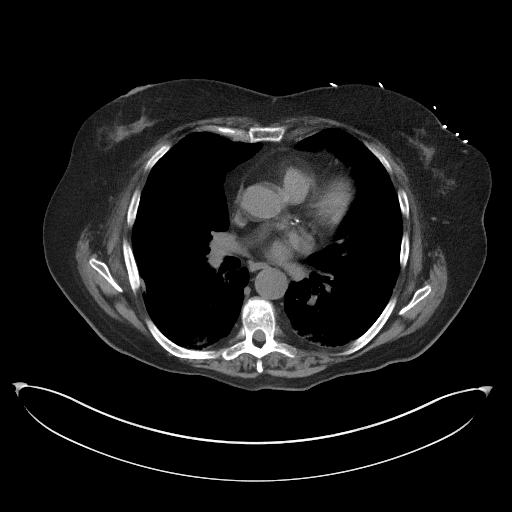

[Series 5: coronal st · coronal · 0.99mm/px · 3 of 136 slices shown]
[im 46/136  soft-tissue]
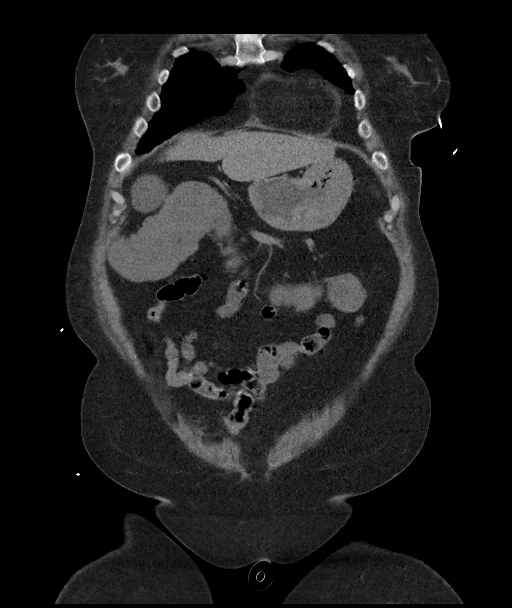
[im 61/136  soft-tissue]
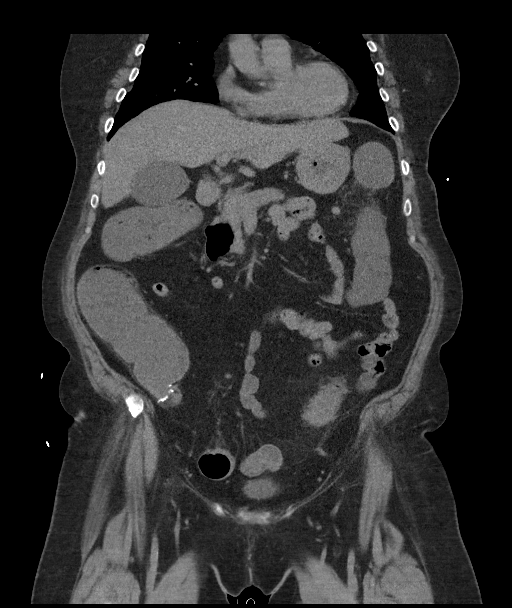
[im 76/136  soft-tissue]
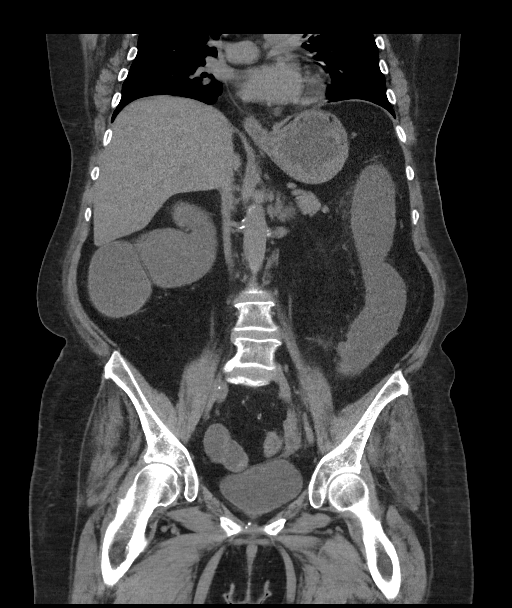

[16 of 46 positions shown; findings below may reference images not displayed]

FINDINGS: Lower chest: Dependent atelectasis in the lower lobes. No effusions.

Hepatobiliary: No focal hepatic abnormality. Gallbladder
unremarkable.

Pancreas: No focal abnormality or ductal dilatation.

Spleen: No focal abnormality.  Normal size.

Adrenals/Urinary Tract: No adrenal abnormality. No focal renal
abnormality. No stones or hydronephrosis. Urinary bladder is
unremarkable.

Stomach/Bowel: Postoperative changes in the sigmoid colon and cecum.
Colon is moderately distended and fluid-filled. Mild haziness
adjacent to the splenic flexure and descending colon. Stomach and
small bowel decompressed, grossly unremarkable.

Vascular/Lymphatic: Aortic atherosclerosis. No evidence of aneurysm
or adenopathy.

Reproductive: Prior hysterectomy.  No adnexal masses.

Other: No free fluid or free air.

Musculoskeletal: No acute bony abnormality.
IMPRESSION: Mildly dilated, fluid-filled colon. Slight haziness/inflammation
adjacent to the splenic flexure and descending colon. Findings could
reflect ileus or possible early left colonic colitis. No evidence of
bowel obstruction.

Aortic atherosclerosis.

Dependent bibasilar atelectasis.

## 2022-05-31 MED ORDER — DIPHENHYDRAMINE HCL 50 MG PO TABS
50.0000 mg | ORAL_TABLET | Freq: Every evening | ORAL | 0 refills | Status: DC | PRN
Start: 2022-05-31 — End: 2022-07-29

## 2022-05-31 MED ORDER — PREDNISONE 50 MG PO TABS: ORAL_TABLET | ORAL | 0 refills | Status: DC

## 2022-05-31 NOTE — Telephone Encounter (Signed)
Elizabeth [name], CMA  ?

## 2022-05-31 NOTE — Telephone Encounter (Signed)
I have Elizabeth [name] Scheduled for a CT abd/pel w/cm on Friday 06/04/22 at [facility] and she has refused it she doesn't want to go and [facility] is scheduled out till end of July into August

## 2022-05-31 NOTE — Telephone Encounter (Signed)
Elizabeth [name] got rescheduled for this Wednesday at [facility] and she told me patient was aware.

## 2022-06-02 ENCOUNTER — Ambulatory Visit (HOSPITAL_COMMUNITY)
Admission: RE | Admit: 2022-06-02 | Discharge: 2022-06-02 | Disposition: A | Discharge status: Home / Self Care | Payer: 59 | Source: Ambulatory Visit | Attending: Gastroenterology | Admitting: Gastroenterology

## 2022-06-02 ENCOUNTER — Other Ambulatory Visit (INDEPENDENT_AMBULATORY_CARE_PROVIDER_SITE_OTHER): Payer: Self-pay | Admitting: Gastroenterology

## 2022-06-02 DIAGNOSIS — K50818 Crohn's disease of both small and large intestine with other complication: Secondary | ICD-10-CM | POA: Insufficient documentation

## 2022-06-03 ENCOUNTER — Ambulatory Visit (INDEPENDENT_AMBULATORY_CARE_PROVIDER_SITE_OTHER): Payer: 59

## 2022-06-03 VITALS — BP 108/61 | HR 71 | Temp 99.0°F | Resp 18 | Ht 67.0 in | Wt 218.6 lb

## 2022-06-03 DIAGNOSIS — R109 Unspecified abdominal pain: Secondary | ICD-10-CM

## 2022-06-03 DIAGNOSIS — K649 Unspecified hemorrhoids: Secondary | ICD-10-CM

## 2022-06-03 DIAGNOSIS — K5792 Diverticulitis of intestine, part unspecified, without perforation or abscess without bleeding: Secondary | ICD-10-CM

## 2022-06-03 DIAGNOSIS — E559 Vitamin D deficiency, unspecified: Secondary | ICD-10-CM

## 2022-06-03 DIAGNOSIS — E876 Hypokalemia: Secondary | ICD-10-CM

## 2022-06-03 DIAGNOSIS — A0472 Enterocolitis due to Clostridium difficile, not specified as recurrent: Secondary | ICD-10-CM

## 2022-06-03 DIAGNOSIS — K50819 Crohn's disease of both small and large intestine with unspecified complications: Secondary | ICD-10-CM

## 2022-06-03 DIAGNOSIS — R058 Other specified cough: Secondary | ICD-10-CM

## 2022-06-03 DIAGNOSIS — K50018 Crohn's disease of small intestine with other complication: Secondary | ICD-10-CM

## 2022-06-03 DIAGNOSIS — K219 Gastro-esophageal reflux disease without esophagitis: Secondary | ICD-10-CM

## 2022-06-03 DIAGNOSIS — K5732 Diverticulitis of large intestine without perforation or abscess without bleeding: Secondary | ICD-10-CM

## 2022-06-03 DIAGNOSIS — R197 Diarrhea, unspecified: Secondary | ICD-10-CM

## 2022-06-03 DIAGNOSIS — R1013 Epigastric pain: Secondary | ICD-10-CM

## 2022-06-03 DIAGNOSIS — K5000 Crohn's disease of small intestine without complications: Secondary | ICD-10-CM

## 2022-06-03 DIAGNOSIS — K567 Ileus, unspecified: Secondary | ICD-10-CM

## 2022-06-03 DIAGNOSIS — R1032 Left lower quadrant pain: Secondary | ICD-10-CM

## 2022-06-03 DIAGNOSIS — Z720 Tobacco use: Secondary | ICD-10-CM

## 2022-06-03 DIAGNOSIS — R7989 Other specified abnormal findings of blood chemistry: Secondary | ICD-10-CM

## 2022-06-03 DIAGNOSIS — M62830 Muscle spasm of back: Secondary | ICD-10-CM

## 2022-06-03 DIAGNOSIS — K6289 Other specified diseases of anus and rectum: Secondary | ICD-10-CM

## 2022-06-03 DIAGNOSIS — R5383 Other fatigue: Secondary | ICD-10-CM

## 2022-06-03 DIAGNOSIS — K50818 Crohn's disease of both small and large intestine with other complication: Secondary | ICD-10-CM

## 2022-06-03 DIAGNOSIS — R110 Nausea: Secondary | ICD-10-CM

## 2022-06-03 DIAGNOSIS — F5101 Primary insomnia: Secondary | ICD-10-CM

## 2022-06-03 DIAGNOSIS — K432 Incisional hernia without obstruction or gangrene: Secondary | ICD-10-CM

## 2022-06-03 DIAGNOSIS — R1031 Right lower quadrant pain: Secondary | ICD-10-CM

## 2022-06-03 DIAGNOSIS — K50118 Crohn's disease of large intestine with other complication: Secondary | ICD-10-CM

## 2022-06-03 MED ORDER — RISANKIZUMAB-RZAA 600 MG/10ML IV SOLN
600.0000 mg | Freq: Once | INTRAVENOUS | Status: AC
  Administered 2022-06-03: 600 mg via INTRAVENOUS
  Filled 2022-06-03: qty 10

## 2022-06-03 NOTE — Progress Notes (Cosign Needed)
Diagnosis: Crohn's Disease  Provider:  Marshell Garfinkel, MD  Procedure: Infusion  IV Type: Peripheral, IV Location: L Hand  Skyrizi , Dose: 629m  Infusion Start Time: 17290 Infusion Stop Time: 12111 Post Infusion IV Care: Observation period completed and Peripheral IV Discontinued  Discharge: Condition: Good, Destination: Home . AVS provided to patient.   Performed by:  LAdelina Mings LPN

## 2022-06-04 ENCOUNTER — Other Ambulatory Visit (HOSPITAL_BASED_OUTPATIENT_CLINIC_OR_DEPARTMENT_OTHER): Payer: 59

## 2022-06-05 ENCOUNTER — Other Ambulatory Visit (INDEPENDENT_AMBULATORY_CARE_PROVIDER_SITE_OTHER): Payer: Self-pay | Admitting: Internal Medicine

## 2022-06-05 MED ORDER — POLYETHYLENE GLYCOL 3350 17 GM/SCOOP PO POWD: 8.5000 g | Freq: Every day | ORAL | 0 refills | Status: DC

## 2022-06-05 MED ORDER — CIPROFLOXACIN HCL 500 MG PO TABS: 500.0000 mg | ORAL_TABLET | Freq: Two times a day (BID) | ORAL | 0 refills | Status: DC

## 2022-06-05 MED ORDER — METRONIDAZOLE 500 MG PO TABS
500.0000 mg | ORAL_TABLET | Freq: Two times a day (BID) | ORAL | 0 refills | Status: DC
Start: 2022-06-05 — End: 2022-07-29

## 2022-06-05 MED ORDER — METRONIDAZOLE 500 MG PO TABS
500.0000 mg | ORAL_TABLET | Freq: Three times a day (TID) | ORAL | 0 refills | Status: DC
Start: 2022-06-05 — End: 2022-06-05

## 2022-06-22 ENCOUNTER — Ambulatory Visit (INDEPENDENT_AMBULATORY_CARE_PROVIDER_SITE_OTHER): Payer: 59 | Admitting: Orthopaedic Surgery

## 2022-06-22 ENCOUNTER — Encounter: Payer: Self-pay | Admitting: Orthopaedic Surgery

## 2022-06-22 DIAGNOSIS — M7061 Trochanteric bursitis, right hip: Secondary | ICD-10-CM | POA: Diagnosis not present

## 2022-06-22 DIAGNOSIS — M7062 Trochanteric bursitis, left hip: Secondary | ICD-10-CM

## 2022-06-22 MED ORDER — HYDROCODONE-ACETAMINOPHEN 10-325 MG PO TABS: ORAL_TABLET | ORAL | 0 refills | Status: DC

## 2022-06-22 NOTE — Progress Notes (Signed)
PROCEDURE NOTE:  The patient request injection, verbal consent was obtained.  The right trochanteric area of the hip was prepped appropriately after time out was performed.   Sterile technique was observed and injection of 1 cc of Celestone 6 mg with several cc's of plain xylocaine. Anesthesia was provided by ethyl chloride and a 20-gauge needle was used to inject the hip area. The injection was tolerated well.  A band aid dressing was applied.  The patient was advised to apply ice later today and tomorrow to the injection sight as needed.  PROCEDURE NOTE:  The patient request injection, verbal consent was obtained.  The left trochanteric area of the hip was prepped appropriately after time out was performed.   Sterile technique was observed and injection of 1 cc of Celestone 6 mg with several cc's of plain xylocaine. Anesthesia was provided by ethyl chloride and a 20-gauge needle was used to inject the hip area. The injection was tolerated well.  A band aid dressing was applied.  The patient was advised to apply ice later today and tomorrow to the injection sight as needed.  Encounter Diagnoses  Name Primary?   Trochanteric bursitis, right hip Yes   Trochanteric bursitis, left hip    I have reviewed the [place] web site prior to prescribing narcotic medicine for this patient.  Return in one month.  Call if any problem.  Precautions discussed.  Electronically Signed Sanjuana Kava, MD 7/11/20239:24 AM

## 2022-06-24 ENCOUNTER — Ambulatory Visit: Payer: 59 | Admitting: Cardiology

## 2022-06-25 ENCOUNTER — Ambulatory Visit (HOSPITAL_COMMUNITY)
Admission: RE | Admit: 2022-06-25 | Discharge: 2022-06-25 | Disposition: A | Discharge status: Home / Self Care | Payer: 59 | Source: Ambulatory Visit | Attending: Cardiovascular Disease | Admitting: Cardiovascular Disease

## 2022-06-25 ENCOUNTER — Ambulatory Visit (HOSPITAL_BASED_OUTPATIENT_CLINIC_OR_DEPARTMENT_OTHER)
Admission: RE | Admit: 2022-06-25 | Discharge: 2022-06-25 | Disposition: A | Discharge status: Home / Self Care | Payer: 59 | Source: Ambulatory Visit | Attending: Cardiovascular Disease | Admitting: Cardiovascular Disease

## 2022-06-25 DIAGNOSIS — R0600 Dyspnea, unspecified: Secondary | ICD-10-CM | POA: Diagnosis present

## 2022-06-25 DIAGNOSIS — R0609 Other forms of dyspnea: Secondary | ICD-10-CM

## 2022-06-25 DIAGNOSIS — F17200 Nicotine dependence, unspecified, uncomplicated: Secondary | ICD-10-CM | POA: Insufficient documentation

## 2022-06-25 LAB — ECHOCARDIOGRAM COMPLETE
AR max vel: 2.25 cm2
AV Area VTI: 2.34 cm2
AV Area mean vel: 2.07 cm2
AV Mean grad: 5 mmHg
AV Peak grad: 9.1 mmHg
Ao pk vel: 1.51 m/s
Area-P 1/2: 3.34 cm2
Calc EF: 64.5 %
MV VTI: 2.83 cm2
S' Lateral: 2.4 cm
Single Plane A2C EF: 67.8 %
Single Plane A4C EF: 63.7 %

## 2022-06-25 NOTE — Progress Notes (Signed)
*  PRELIMINARY RESULTS* Echocardiogram 2D Echocardiogram has been performed.  Elizabeth [name] 06/25/2022, 2:06 PM

## 2022-06-29 ENCOUNTER — Ambulatory Visit (HOSPITAL_COMMUNITY)
Admission: RE | Admit: 2022-06-29 | Discharge: 2022-06-29 | Disposition: A | Discharge status: Home / Self Care | Payer: 59 | Source: Ambulatory Visit | Attending: Cardiovascular Disease | Admitting: Cardiovascular Disease

## 2022-06-29 ENCOUNTER — Telehealth: Payer: Self-pay | Admitting: Cardiovascular Disease

## 2022-06-29 DIAGNOSIS — R0600 Dyspnea, unspecified: Secondary | ICD-10-CM | POA: Insufficient documentation

## 2022-06-29 NOTE — Telephone Encounter (Signed)
-----   Message from Michaelyn Barter, RN sent at 06/25/2022  4:02 PM EDT -----  ----- Message ----- From: Josue Hector, MD Sent: 06/25/2022   3:01 PM EDT To: Michaelyn Barter, RN  Normal echo with good EF and no significant valvular heart disease

## 2022-06-29 NOTE — Telephone Encounter (Signed)
Patient notified and verbalized understanding. 

## 2022-06-29 NOTE — Telephone Encounter (Signed)
-----   Message from Michaelyn Barter, RN sent at 06/29/2022 11:19 AM EDT -----  ----- Message ----- From: Josue Hector, MD Sent: 06/28/2022  11:33 AM EDT To: Michaelyn Barter, RN  No lung cancer good

## 2022-06-29 NOTE — Telephone Encounter (Signed)
Patient is returning RN's call regarding Echo results. Please advise.

## 2022-06-30 ENCOUNTER — Telehealth: Payer: Self-pay

## 2022-06-30 MED ORDER — EZETIMIBE 10 MG PO TABS: 10.0000 mg | ORAL_TABLET | Freq: Every day | ORAL | 3 refills | Status: DC

## 2022-06-30 NOTE — Telephone Encounter (Signed)
-----   Message from Josue Hector, MD sent at 06/29/2022  5:14 PM EDT ----- Calcium score above average for age and smoker Should be on statin but she refuses If she is willing To try non statin can start Zetia

## 2022-06-30 NOTE — Telephone Encounter (Signed)
Spoke to patient who voiced understanding of results. Patient is willing to try Zetia 10 mg tablets. Prescription for medication sent to [facility] [place] per pt's request.

## 2022-07-01 ENCOUNTER — Ambulatory Visit (INDEPENDENT_AMBULATORY_CARE_PROVIDER_SITE_OTHER): Payer: 59

## 2022-07-01 VITALS — BP 96/67 | HR 94 | Temp 99.0°F | Resp 18 | Ht 67.0 in | Wt 209.6 lb

## 2022-07-01 DIAGNOSIS — K6289 Other specified diseases of anus and rectum: Secondary | ICD-10-CM

## 2022-07-01 DIAGNOSIS — Z720 Tobacco use: Secondary | ICD-10-CM

## 2022-07-01 DIAGNOSIS — K5000 Crohn's disease of small intestine without complications: Secondary | ICD-10-CM | POA: Diagnosis not present

## 2022-07-01 DIAGNOSIS — R7989 Other specified abnormal findings of blood chemistry: Secondary | ICD-10-CM

## 2022-07-01 DIAGNOSIS — R197 Diarrhea, unspecified: Secondary | ICD-10-CM

## 2022-07-01 DIAGNOSIS — R109 Unspecified abdominal pain: Secondary | ICD-10-CM

## 2022-07-01 DIAGNOSIS — K432 Incisional hernia without obstruction or gangrene: Secondary | ICD-10-CM

## 2022-07-01 DIAGNOSIS — K50118 Crohn's disease of large intestine with other complication: Secondary | ICD-10-CM | POA: Diagnosis not present

## 2022-07-01 DIAGNOSIS — R1031 Right lower quadrant pain: Secondary | ICD-10-CM

## 2022-07-01 DIAGNOSIS — K567 Ileus, unspecified: Secondary | ICD-10-CM | POA: Diagnosis not present

## 2022-07-01 DIAGNOSIS — K5792 Diverticulitis of intestine, part unspecified, without perforation or abscess without bleeding: Secondary | ICD-10-CM

## 2022-07-01 DIAGNOSIS — E559 Vitamin D deficiency, unspecified: Secondary | ICD-10-CM

## 2022-07-01 DIAGNOSIS — K50819 Crohn's disease of both small and large intestine with unspecified complications: Secondary | ICD-10-CM

## 2022-07-01 DIAGNOSIS — K219 Gastro-esophageal reflux disease without esophagitis: Secondary | ICD-10-CM

## 2022-07-01 DIAGNOSIS — R5383 Other fatigue: Secondary | ICD-10-CM

## 2022-07-01 DIAGNOSIS — A0472 Enterocolitis due to Clostridium difficile, not specified as recurrent: Secondary | ICD-10-CM

## 2022-07-01 DIAGNOSIS — R110 Nausea: Secondary | ICD-10-CM

## 2022-07-01 DIAGNOSIS — K50018 Crohn's disease of small intestine with other complication: Secondary | ICD-10-CM

## 2022-07-01 DIAGNOSIS — M62830 Muscle spasm of back: Secondary | ICD-10-CM

## 2022-07-01 DIAGNOSIS — F5101 Primary insomnia: Secondary | ICD-10-CM

## 2022-07-01 DIAGNOSIS — K649 Unspecified hemorrhoids: Secondary | ICD-10-CM

## 2022-07-01 DIAGNOSIS — R058 Other specified cough: Secondary | ICD-10-CM

## 2022-07-01 DIAGNOSIS — R1032 Left lower quadrant pain: Secondary | ICD-10-CM

## 2022-07-01 DIAGNOSIS — K5732 Diverticulitis of large intestine without perforation or abscess without bleeding: Secondary | ICD-10-CM

## 2022-07-01 DIAGNOSIS — E876 Hypokalemia: Secondary | ICD-10-CM

## 2022-07-01 DIAGNOSIS — R1013 Epigastric pain: Secondary | ICD-10-CM

## 2022-07-01 MED ORDER — RISANKIZUMAB-RZAA 600 MG/10ML IV SOLN
600.0000 mg | Freq: Once | INTRAVENOUS | Status: AC
  Administered 2022-07-01: 600 mg via INTRAVENOUS
  Filled 2022-07-01: qty 10

## 2022-07-01 NOTE — Patient Instructions (Signed)
[id number]  

## 2022-07-01 NOTE — Progress Notes (Signed)
Diagnosis: Crohn's Disease  Provider:  Marshell Garfinkel, MD  Procedure: Infusion  IV Type: Peripheral, IV Location: L Hand  Skyrizi, Dose: 600  Infusion Start Time: 5916  Infusion Stop Time: 1450  Post Infusion IV Care: Peripheral IV Discontinued  Discharge: Condition: Good, Destination: Home . AVS provided to patient.   Performed by:  Cleophus Molt, RN

## 2022-07-06 ENCOUNTER — Ambulatory Visit: Payer: 59 | Admitting: Orthopaedic Surgery

## 2022-07-06 ENCOUNTER — Encounter: Payer: Self-pay | Admitting: Orthopaedic Surgery

## 2022-07-06 VITALS — BP 95/64 | HR 107 | Ht 67.0 in | Wt 209.0 lb

## 2022-07-06 DIAGNOSIS — M7061 Trochanteric bursitis, right hip: Secondary | ICD-10-CM

## 2022-07-06 DIAGNOSIS — M7062 Trochanteric bursitis, left hip: Secondary | ICD-10-CM | POA: Diagnosis not present

## 2022-07-06 NOTE — Progress Notes (Signed)
My hips are hurting again.  Two weeks ago I injected both hip trochanteric areas.  She then had less pain and overdid it.  She is hurting again now.  She has no new trauma.  I told her it is too early to inject again.  I will set her up for PT in [place].  She is to use ice/heat and NSAIDs and rubs.  Both hips are tender over trochanteric areas.  ROM is good but tender.  NV intact.  Encounter Diagnoses  Name Primary?   Trochanteric bursitis, right hip Yes   Trochanteric bursitis, left hip    Return in two weeks.  Call if any problem.  Precautions discussed.  Electronically Signed Sanjuana Kava, MD 7/25/20233:56 PM

## 2022-07-12 ENCOUNTER — Ambulatory Visit: Payer: 59 | Attending: Orthopaedic Surgery | Admitting: Physical Therapy

## 2022-07-12 ENCOUNTER — Other Ambulatory Visit: Payer: Self-pay

## 2022-07-12 ENCOUNTER — Encounter: Payer: Self-pay | Admitting: Physical Therapy

## 2022-07-12 DIAGNOSIS — M6281 Muscle weakness (generalized): Secondary | ICD-10-CM | POA: Diagnosis present

## 2022-07-12 DIAGNOSIS — M7062 Trochanteric bursitis, left hip: Secondary | ICD-10-CM | POA: Insufficient documentation

## 2022-07-12 DIAGNOSIS — M25552 Pain in left hip: Secondary | ICD-10-CM | POA: Insufficient documentation

## 2022-07-12 DIAGNOSIS — M7061 Trochanteric bursitis, right hip: Secondary | ICD-10-CM | POA: Diagnosis not present

## 2022-07-12 DIAGNOSIS — M25551 Pain in right hip: Secondary | ICD-10-CM | POA: Insufficient documentation

## 2022-07-12 NOTE — Therapy (Signed)
OUTPATIENT PHYSICAL THERAPY LOWER EXTREMITY EVALUATION   Patient Name: Elizabeth [name] MRN: [id number] DOB:08-31-1961, 61 y.o., female Today's Date: 07/12/2022   PT End of Session - 07/12/22 1116     Visit Number 1    Number of Visits 12    Date for PT Re-Evaluation 08/23/22    Authorization Type FOTO AT LEAST EVERY 5TH VISIT.  PROGRESS NOTE AT 10TH VISIT.  KX MODIFIER AFTER 15 VISITS.    PT Start Time [phone]    PT Stop Time 1028    PT Time Calculation (min) 45 min    Activity Tolerance Patient tolerated treatment well    Behavior During Therapy [facility] for tasks assessed/performed             Past Medical History:  Diagnosis Date   Bronchitis    C. difficile diarrhea    Cough 01/28/2014   upper airway cough syndrome   Crohn's disease ([place])    Diverticulitis    DVT 06/2020   history of   Elevated transaminase level    GERD (gastroesophageal reflux disease)    Hypertension    Obesity    Pneumonia 12/2013   Tobacco abuse    Past Surgical History:  Procedure Laterality Date   ABDOMINAL HYSTERECTOMY  12/13/1978   APPENDECTOMY     BIOPSY  11/11/2021   Procedure: BIOPSY;  Surgeon: Rogene Houston, MD;  [facility];  Service: Endoscopy;;  bx of polyp and small bowel   COLON SURGERY     COLONOSCOPY     COLONOSCOPY N/A 08/16/2014   Procedure: COLONOSCOPY;  Surgeon: Rogene Houston, MD;  [facility];  Service: Endoscopy;  Laterality: N/A;  1200-rescheduled to 9/4 @ 10:45 Ann notified pt   COLONOSCOPY WITH PROPOFOL N/A 11/11/2021   Procedure: COLONOSCOPY WITH PROPOFOL;  Surgeon: Rogene Houston, MD;  [facility];  Service: Endoscopy;  Laterality: N/A;  2:05   ESOPHAGOGASTRODUODENOSCOPY  06/16/2012   Procedure: ESOPHAGOGASTRODUODENOSCOPY (EGD);  Surgeon: Rogene Houston, MD;  [facility];  Service: Endoscopy;  Laterality: N/A;  10:30 AM   hemocolectomy  12/14/2003   right   Illeocecal Resection  2005   INCISIONAL HERNIA REPAIR  N/A 08/24/2021   Procedure: HERNIA REPAIR INCISIONAL WITH MESH;  Surgeon: Aviva Signs, MD;  [facility]: [facility];  Service: General;  Laterality: N/A;   LAPAROSCOPIC SIGMOID COLECTOMY N/A 01/07/2015   Procedure: LOW ANTERIOR COLON RESECTION;  Surgeon: Fanny Skates, MD;  Location: [place];  Service: General;  Laterality: N/A;   SHOULDER ARTHROSCOPY WITH LABRAL REPAIR Right 01/20/2016   Procedure: SHOULDER ARTHROSCOPY WITH LABRAL REPAIR;  Surgeon: Meredith Pel, MD;  Location: [place];  Service: Orthopedics;  Laterality: Right;   Patient Active Problem List   Diagnosis Date Noted   Vitamin D deficiency 01/21/2022   Ileus ([place]) 12/24/2021   Crohn's colitis ([place]) 12/21/2021   Hypomagnesemia 12/21/2021   LLQ abdominal pain    Abdominal pain 10/06/2021   Incisional hernia, without obstruction or gangrene    Abnormal LFTs 07/14/2021   Abdominal wall pain 03/31/2021   Crohn's disease of both small and large intestine ([place]) 12/02/2020   Diarrhea 09/02/2020   C. difficile diarrhea 08/07/2020   Back spasm 05/20/2020   Rectal pain 11/20/2019   Hemorrhoids 11/20/2019   Crohn's disease ([place]) 04/19/2019   Acute bronchitis 11/29/2017   Primary insomnia 04/08/2017   Strain of back 04/08/2017   Diverticulitis large intestine 01/07/2015   Diverticulitis 07/03/2014  Hypokalemia 07/03/2014   Tobacco abuse    Obesity    Upper airway cough syndrome 01/28/2014   IBS (irritable bowel syndrome) 07/10/2013   Acute bilateral lower abdominal pain 03/26/2013   Tiredness 03/26/2013   Sigmoid diverticulitis 09/12/2012   Epigastric pain 05/22/2012   Nausea without vomiting 01/28/2012   Crohn's disease of ileum ([place]) 01/10/2012   GERD (gastroesophageal reflux disease) 01/10/2012   HTN (hypertension) 01/10/2012   REFERRING PROVIDER: Sanjuana Kava MD  REFERRING DIAG: Bilateral trochanteric bursitis  THERAPY DIAG:  Pain in right hip  Pain in left hip  Muscle weakness (generalized)  Rationale for  Evaluation and Treatment Rehabilitation  ONSET DATE: 12/2021.  SUBJECTIVE:   SUBJECTIVE STATEMENT: Te patient presents to the clinic with bilateral hip pain that has been ongoing since January of this year.  She had an injection a couple of weeks ago that was quite helpful but overdid it and she is flared-up again.  She presented to the clinic today with a high pain-level. Increased activity increases her pain.    PERTINENT HISTORY: HTN, right shoulder surgery.  PAIN:  Are you having pain? Yes: NPRS scale: 8/10 Pain location: Bilateral hips. Pain description: Ache, throb, sharp. Aggravating factors: Increased activity, walking, attempting to sleep on sides. Relieving factors: Cortisone shot.  PRECAUTIONS: None  WEIGHT BEARING RESTRICTIONS No  FALLS:  Has patient fallen in last 6 months? No  LIVING ENVIRONMENT:          House/apartment  Has following equipment at home: None  OCCUPATION: Disabled.  PLOF: Independent with basic ADLs  PATIENT GOALS Get out of pain.   OBJECTIVE:   PATIENT SURVEYS:  FOTO Complete.  PALPATION:  Very tender to palpation even to light palpation over bilateral greater trochanters.  LOWER EXTREMITY ROM:  Normal bilateral hip range of motion.  LOWER EXTREMITY MMT:  Difficult to accurately access hip strength due to high pain-level. GAIT: Very antalgic, Trendelenburg-type gait cycle.  TODAY'S TREATMENT: Pre-mod e'stim to bilateral hips x 20 minutes at 80-150 Hz.   ASSESSMENT:  CLINICAL IMPRESSION: The patient presents to OPPT with bilateral hip pain.  She is very tender to palpation over both greater trochanters.  Her strength could not be accurately assessed to her high pain-level.  Range of motion is motion.  Her gait is very antalgic and she exhibits a Trendelenburg-type gait pattern.  Her functional mobility is impaired.   Patient will benefit from skilled physical therapy intervention to address pain and deficits.   OBJECTIVE  IMPAIRMENTS Abnormal gait, decreased activity tolerance, difficulty walking, and pain.   ACTIVITY LIMITATIONS standing and sleeping  PARTICIPATION LIMITATIONS: cleaning, laundry, and yard work  [employer] POTENTIAL: Good  CLINICAL DECISION MAKING: Stable/uncomplicated  EVALUATION COMPLEXITY: Low   GOALS:   LONG TERM GOALS: Target date: 08/23/2022   Ind with HEP. Baseline:  Goal status: INITIAL  2.  Sleep 6 hours undisturbed. Baseline:  Goal status: INITIAL  3.  Perform ADL's with pain not > 3/10. Baseline:  Goal status: INITIAL  4.  Walk with a normal gait pattern. Baseline:  Goal status: INITIAL  PLAN: PT FREQUENCY: 2x/week  PT DURATION: 6 weeks  PLANNED INTERVENTIONS: Therapeutic exercises, Therapeutic activity, Gait training, Patient/Family education, Self Care, Dry Needling, Electrical stimulation, Cryotherapy, Moist heat, and Ultrasound  PLAN FOR NEXT SESSION: Modalities and STW/M as needed.  There ex to bilateral hips.   Joud Pettinato, [place], PT 07/12/2022, 11:17 AM

## 2022-07-15 ENCOUNTER — Other Ambulatory Visit (INDEPENDENT_AMBULATORY_CARE_PROVIDER_SITE_OTHER): Payer: Self-pay | Admitting: Internal Medicine

## 2022-07-15 ENCOUNTER — Telehealth (INDEPENDENT_AMBULATORY_CARE_PROVIDER_SITE_OTHER): Payer: Self-pay | Admitting: *Deleted

## 2022-07-15 ENCOUNTER — Ambulatory Visit: Payer: 59 | Attending: Orthopaedic Surgery

## 2022-07-15 DIAGNOSIS — M6281 Muscle weakness (generalized): Secondary | ICD-10-CM | POA: Diagnosis present

## 2022-07-15 DIAGNOSIS — M25552 Pain in left hip: Secondary | ICD-10-CM | POA: Diagnosis present

## 2022-07-15 DIAGNOSIS — M25551 Pain in right hip: Secondary | ICD-10-CM | POA: Diagnosis present

## 2022-07-15 NOTE — Telephone Encounter (Signed)
Any antibiotic could cause recurrent C. Diff. She should have that buttock area checked by her PCP or a medical provider

## 2022-07-15 NOTE — Telephone Encounter (Signed)
Patient called and states she noticed a boil on her buttock today. Asked what antibiotic she should be on with her history of c diff. I advised her to call her primary care doctor and let pcp know of her history of c diff. She states she will call today.

## 2022-07-15 NOTE — Therapy (Signed)
OUTPATIENT PHYSICAL THERAPY LOWER EXTREMITY TREATMENT   Patient Name: Elizabeth [name] MRN: [id number] DOB:Oct 08, 1961, 61 y.o., female Today's Date: 07/15/2022   PT End of Session - 07/15/22 0951     Visit Number 2    Number of Visits 12    Date for PT Re-Evaluation 08/23/22    Authorization Type FOTO AT LEAST EVERY 5TH VISIT.  PROGRESS NOTE AT 10TH VISIT.  KX MODIFIER AFTER 15 VISITS.    PT Start Time 0945    Activity Tolerance Patient tolerated treatment well    Behavior During Therapy [facility] for tasks assessed/performed             Past Medical History:  Diagnosis Date   Bronchitis    C. difficile diarrhea    Cough 01/28/2014   upper airway cough syndrome   Crohn's disease ([place])    Diverticulitis    DVT 06/2020   history of   Elevated transaminase level    GERD (gastroesophageal reflux disease)    Hypertension    Obesity    Pneumonia 12/2013   Tobacco abuse    Past Surgical History:  Procedure Laterality Date   ABDOMINAL HYSTERECTOMY  12/13/1978   APPENDECTOMY     BIOPSY  11/11/2021   Procedure: BIOPSY;  Surgeon: Rogene Houston, MD;  [facility];  Service: Endoscopy;;  bx of polyp and small bowel   COLON SURGERY     COLONOSCOPY     COLONOSCOPY N/A 08/16/2014   Procedure: COLONOSCOPY;  Surgeon: Rogene Houston, MD;  [facility];  Service: Endoscopy;  Laterality: N/A;  1200-rescheduled to 9/4 @ 10:45 Ann notified pt   COLONOSCOPY WITH PROPOFOL N/A 11/11/2021   Procedure: COLONOSCOPY WITH PROPOFOL;  Surgeon: Rogene Houston, MD;  [facility];  Service: Endoscopy;  Laterality: N/A;  2:05   ESOPHAGOGASTRODUODENOSCOPY  06/16/2012   Procedure: ESOPHAGOGASTRODUODENOSCOPY (EGD);  Surgeon: Rogene Houston, MD;  [facility];  Service: Endoscopy;  Laterality: N/A;  10:30 AM   hemocolectomy  12/14/2003   right   Illeocecal Resection  2005   INCISIONAL HERNIA REPAIR N/A 08/24/2021   Procedure: HERNIA REPAIR INCISIONAL WITH  MESH;  Surgeon: Aviva Signs, MD;  [facility];  Service: General;  Laterality: N/A;   LAPAROSCOPIC SIGMOID COLECTOMY N/A 01/07/2015   Procedure: LOW ANTERIOR COLON RESECTION;  Surgeon: Fanny Skates, MD;  Location: [place];  Service: General;  Laterality: N/A;   SHOULDER ARTHROSCOPY WITH LABRAL REPAIR Right 01/20/2016   Procedure: SHOULDER ARTHROSCOPY WITH LABRAL REPAIR;  Surgeon: Meredith Pel, MD;  Location: [place];  Service: Orthopedics;  Laterality: Right;   Patient Active Problem List   Diagnosis Date Noted   Vitamin D deficiency 01/21/2022   Ileus ([place]) 12/24/2021   Crohn's colitis ([place]) 12/21/2021   Hypomagnesemia 12/21/2021   LLQ abdominal pain    Abdominal pain 10/06/2021   Incisional hernia, without obstruction or gangrene    Abnormal LFTs 07/14/2021   Abdominal wall pain 03/31/2021   Crohn's disease of both small and large intestine ([place]) 12/02/2020   Diarrhea 09/02/2020   C. difficile diarrhea 08/07/2020   Back spasm 05/20/2020   Rectal pain 11/20/2019   Hemorrhoids 11/20/2019   Crohn's disease ([place]) 04/19/2019   Acute bronchitis 11/29/2017   Primary insomnia 04/08/2017   Strain of back 04/08/2017   Diverticulitis large intestine 01/07/2015   Diverticulitis 07/03/2014   Hypokalemia 07/03/2014   Tobacco abuse    Obesity    Upper  airway cough syndrome 01/28/2014   IBS (irritable bowel syndrome) 07/10/2013   Acute bilateral lower abdominal pain 03/26/2013   Tiredness 03/26/2013   Sigmoid diverticulitis 09/12/2012   Epigastric pain 05/22/2012   Nausea without vomiting 01/28/2012   Crohn's disease of ileum ([place]) 01/10/2012   GERD (gastroesophageal reflux disease) 01/10/2012   HTN (hypertension) 01/10/2012   REFERRING PROVIDER: Sanjuana Kava MD  REFERRING DIAG: Bilateral trochanteric bursitis  THERAPY DIAG:  Pain in right hip  Pain in left hip  Muscle weakness (generalized)  Rationale for Evaluation and Treatment Rehabilitation  ONSET DATE:  12/2021.  SUBJECTIVE:   SUBJECTIVE STATEMENT: Pt reports increased pain today  PERTINENT HISTORY: HTN, right shoulder surgery.  PAIN:  Are you having pain? Yes: NPRS scale: 10/10 Pain location: Bilateral hips. Pain description: Ache, throb, sharp. Aggravating factors: Increased activity, walking, attempting to sleep on sides. Relieving factors: Cortisone shot.  PRECAUTIONS: None  WEIGHT BEARING RESTRICTIONS No  FALLS:  Has patient fallen in last 6 months? No  LIVING ENVIRONMENT:          House/apartment  Has following equipment at home: None  OCCUPATION: Disabled.  PLOF: Independent with basic ADLs  PATIENT GOALS Get out of pain.   OBJECTIVE:   PATIENT SURVEYS:  FOTO Complete.  PALPATION:  Very tender to palpation even to light palpation over bilateral greater trochanters.  LOWER EXTREMITY ROM:  Normal bilateral hip range of motion.  LOWER EXTREMITY MMT:  Difficult to accurately access hip strength due to high pain-level. GAIT: Very antalgic, Trendelenburg-type gait cycle.  TODAY'S TREATMENT:  Modalities  Date:  Unattended Estim: Hip, Pre- mod80-150 Hz, 20 mins, Pain Ultrasound: Hip, 1.5 w/cm2, 16 mins, Pain Hot Pack: Hip, 20 mins, Pain   ASSESSMENT:  CLINICAL IMPRESSION: Pt arrives for today's treatment session reporting 10/10 B hip pain.  Pt unable to tolerate stretches or STW due to pain.  Pt with difficulty rolling and maneuvering on treatment table.  Ultrasound performed to B hips to decrease pain and inflammation.  Pre-mod estim and Mh applied to bil hips to decrease pain and tone.  Pt declined wedge under BLE when positioned in supine.  Normal responses to all modalites noted upon completion.  Pt reported 9/10 B hip pain at completion of today's treatment session.    OBJECTIVE IMPAIRMENTS Abnormal gait, decreased activity tolerance, difficulty walking, and pain.   ACTIVITY LIMITATIONS standing and sleeping  PARTICIPATION LIMITATIONS:  cleaning, laundry, and yard work  [employer] POTENTIAL: Good  CLINICAL DECISION MAKING: Stable/uncomplicated  EVALUATION COMPLEXITY: Low   GOALS:   LONG TERM GOALS: Target date: 08/26/2022   Ind with HEP. Baseline:  Goal status: INITIAL  2.  Sleep 6 hours undisturbed. Baseline:  Goal status: INITIAL  3.  Perform ADL's with pain not > 3/10. Baseline:  Goal status: INITIAL  4.  Walk with a normal gait pattern. Baseline:  Goal status: INITIAL  PLAN: PT FREQUENCY: 2x/week  PT DURATION: 6 weeks  PLANNED INTERVENTIONS: Therapeutic exercises, Therapeutic activity, Gait training, Patient/Family education, Self Care, Dry Needling, Electrical stimulation, Cryotherapy, Moist heat, and Ultrasound  PLAN FOR NEXT SESSION: Modalities and STW/M as needed.  There ex to bilateral hips.   Kathrynn Ducking, PTA 07/15/2022, 9:52 AM

## 2022-07-16 NOTE — Telephone Encounter (Signed)
Patient told me she has appt today with pcp.

## 2022-07-20 ENCOUNTER — Ambulatory Visit: Payer: 59 | Admitting: Orthopaedic Surgery

## 2022-07-21 ENCOUNTER — Telehealth: Payer: Self-pay | Admitting: Pharmacy Technician

## 2022-07-21 NOTE — Telephone Encounter (Signed)
[employer] is going away as of 08/13/22. Will defer maintenance PA until 08/13/22, for patient's new insurance plan.

## 2022-07-21 NOTE — Telephone Encounter (Signed)
Patient will receive final induction dose of skyrizi 07/29/22.  Patient will need PA for Skyrizi on body injector maintenance dosing  (pharmacy benefit) Forwarding message to Dolores Lory to initiate PA process.

## 2022-07-26 ENCOUNTER — Other Ambulatory Visit (INDEPENDENT_AMBULATORY_CARE_PROVIDER_SITE_OTHER): Payer: Self-pay | Admitting: Internal Medicine

## 2022-07-26 NOTE — Telephone Encounter (Signed)
05/27/2022 last seen

## 2022-07-29 ENCOUNTER — Ambulatory Visit (INDEPENDENT_AMBULATORY_CARE_PROVIDER_SITE_OTHER): Payer: 59 | Admitting: Gastroenterology

## 2022-07-29 ENCOUNTER — Ambulatory Visit: Payer: 59

## 2022-07-29 ENCOUNTER — Encounter (INDEPENDENT_AMBULATORY_CARE_PROVIDER_SITE_OTHER): Payer: Self-pay | Admitting: Gastroenterology

## 2022-07-29 VITALS — BP 116/66 | HR 86 | Temp 98.9°F | Ht 67.0 in | Wt 209.5 lb

## 2022-07-29 DIAGNOSIS — K50818 Crohn's disease of both small and large intestine with other complication: Secondary | ICD-10-CM

## 2022-07-29 DIAGNOSIS — K50114 Crohn's disease of large intestine with abscess: Secondary | ICD-10-CM | POA: Insufficient documentation

## 2022-07-29 MED ORDER — METRONIDAZOLE 500 MG PO TABS: 500.0000 mg | ORAL_TABLET | Freq: Three times a day (TID) | ORAL | 0 refills | Status: AC

## 2022-07-29 NOTE — Patient Instructions (Addendum)
Patient was counseled regarding the importance of stopping cigarette smoking. The patient was informed about the long term effects of smoking, progression of current disease. Schedule colonoscopy in mid November [place] - next dose tomorrow then SQ injections in 4 weeks and then every 8 weeks Stop Bactrim and start Flagyl 500 mg every 8 hours for 3 weeks Perform blood workup

## 2022-07-29 NOTE — Progress Notes (Signed)
[name], M.D. Gastroenterology & Hepatology [facility] [address] [place], [place] [address]  Primary Care Physician: Caryl Bis, MD [place] [id number]  I will communicate my assessment and recommendations to the referring MD via EMR.  Problems: Ileocolonic Crohn's disease disease status post ileocecectomy and with perianal disease Recent Crohn's related colonic abscess Perianal abscess  History of Present Illness: Elizabeth [name] is a 61 y.o. female with past medical history of ileocolonic c Crohn's disease status post ileocecectomy and partial colectomy, DVT on Xarelto, recurrent C. Diff, hypertension, GERD, obesity, who presents for follow up of Crohn's disease.  The patient was last seen on 05/27/2022. At that time, the patient was ordered to undergo a CT scan.  Most recent abdominal imaging was performed on 06/02/2022, she underwent a CT of the abdomen and pelvis with IV contrast which showed presence of wall thickening in the neoterminal ileum and questionable thickening of the sigmoid colon.She was advised to start prednisone taper. She believes she finished her last course of prednisone possibly a month ago, she had not started the [place] at that time.  The patient reports that 6 days she was started on Bactrim for a "boil in her buttock". She states that since then she presented some  nausea. She believes there was an abscess that started draining on its own.  Reports that the size is smaller than the initial lesion.  She states that she has 1-2 Bms per day which have normal consistency. The patient denies having any nausea, vomiting, fever, chills, hematochezia, melena, hematemesis, abdominal distention, abdominal pain, diarrhea, jaundice, pruritus. Has recently lost some weight as she has been off prednisone.  Regarding her Crohn's disease, patient has previously failed management with Humira every week.   Has previously been on 6-MP.  Patient is currently on [place] management for her Crohn's disease.  She has already started the IV induction and will be done with the IV induction tomorrow.  She will then start 360 mg 4 weeks later and then every 8-week dosing subcutaneously. She was on Pentasa   Patient reports that she is still smoking - smokes half a pack every 3-4 days.  Last colonoscopy was in November 2022 revealing active disease involving neoterminal ileum with noncritical stricture.  Biopsy from inflamed mucosa at descending colon revealed hyperplastic changes and raised the possibility of sessile serrated polyp.  Past Medical History: Past Medical History:  Diagnosis Date   Bronchitis    C. difficile diarrhea    Cough 01/28/2014   upper airway cough syndrome   Crohn's disease ([place])    Diverticulitis    DVT 06/2020   history of   Elevated transaminase level    GERD (gastroesophageal reflux disease)    Hypertension    Obesity    Pneumonia 12/2013   Tobacco abuse     Past Surgical History: Past Surgical History:  Procedure Laterality Date   ABDOMINAL HYSTERECTOMY  12/13/1978   APPENDECTOMY     BIOPSY  11/11/2021   Procedure: BIOPSY;  Surgeon: Rogene Houston, MD;  [facility];  Service: Endoscopy;;  bx of polyp and small bowel   COLON SURGERY     COLONOSCOPY     COLONOSCOPY N/A 08/16/2014   Procedure: COLONOSCOPY;  Surgeon: Rogene Houston, MD;  [facility];  Service: Endoscopy;  Laterality: N/A;  1200-rescheduled to 9/4 @ 10:45 Ann notified pt   COLONOSCOPY WITH PROPOFOL N/A  11/11/2021   Procedure: COLONOSCOPY WITH PROPOFOL;  Surgeon: Rogene Houston, MD;  [facility];  Service: Endoscopy;  Laterality: N/A;  2:05   ESOPHAGOGASTRODUODENOSCOPY  06/16/2012   Procedure: ESOPHAGOGASTRODUODENOSCOPY (EGD);  Surgeon: Rogene Houston, MD;  [facility];  Service: Endoscopy;  Laterality: N/A;  10:30 AM   hemocolectomy  12/14/2003    right   Illeocecal Resection  2005   INCISIONAL HERNIA REPAIR N/A 08/24/2021   Procedure: HERNIA REPAIR INCISIONAL WITH MESH;  Surgeon: Aviva Signs, MD;  Location: [facility];  Service: General;  Laterality: N/A;   LAPAROSCOPIC SIGMOID COLECTOMY N/A 01/07/2015   Procedure: LOW ANTERIOR COLON RESECTION;  Surgeon: Fanny Skates, MD;  Location: [facility];  Service: General;  Laterality: N/A;   SHOULDER ARTHROSCOPY WITH LABRAL REPAIR Right 01/20/2016   Procedure: SHOULDER ARTHROSCOPY WITH LABRAL REPAIR;  Surgeon: Meredith Pel, MD;  Location: [facility];  Service: Orthopedics;  Laterality: Right;    Family History: Family History  Problem Relation Age of Onset   Healthy Mother    Lung cancer Father        smoked   Allergic Disorder Brother    Healthy Daughter     Social History: Social History   Tobacco Use  Smoking Status Every Day   Packs/day: 0.25   Years: 40.00   Total pack years: 10.00   Types: Cigarettes   Passive exposure: Current  Smokeless Tobacco Never  Tobacco Comments   increase smoking , very nervous   Social History   Substance and Sexual Activity  Alcohol Use Not Currently   Alcohol/week: 0.0 standard drinks of alcohol   Comment: occ   Social History   Substance and Sexual Activity  Drug Use No    Allergies: Allergies  Allergen Reactions   Contrast Media [Iodinated Contrast Media] Hives and Rash   Penicillins Hives and Rash             Medications: Current Outpatient Medications  Medication Sig Dispense Refill   albuterol (VENTOLIN HFA) 108 (90 Base) MCG/ACT inhaler Inhale 2 puffs into the lungs every 6 (six) hours as needed for wheezing or shortness of breath.     Ascorbic Acid (VITAMIN C) 1000 MG tablet Take 1,000 mg by mouth daily.     ERGOCALCIFEROL PO Take by mouth. 2,000 units per day.     fluticasone (FLONASE) 50 MCG/ACT nasal spray Place 2 sprays into both nostrils daily as needed for allergies or rhinitis.     HYDROcodone-acetaminophen  (NORCO) 10-325 MG tablet One-half tablet every four hours as needed for pain. 10 tablet 0   omeprazole (PRILOSEC) 40 MG capsule Take 40 mg by mouth daily before breakfast.     ondansetron (ZOFRAN-ODT) 8 MG disintegrating tablet TAKE 1 TABLET (8 MG TOTAL) BY MOUTH 2 (TWO) TIMES DAILY AS NEEDED. 18 tablet 3   potassium chloride (MICRO-K) 10 MEQ CR capsule Take 10 mEq by mouth 2 (two) times daily.      promethazine (PHENERGAN) 25 MG tablet TAKE 1 TABLET BY MOUTH TWICE A DAY AS NEEDED 20 tablet 0   Risankizumab-rzaa (SKYRIZI IV) Inject into the vein. To start 06/03/2022 At [facility] [address], [place].     rivaroxaban (XARELTO) 20 MG TABS tablet Take 1 tablet (20 mg total) by mouth daily with supper. 30 tablet    sulfamethoxazole-trimethoprim (BACTRIM DS) 800-160 MG tablet Take 2 tablets by mouth 2 (two) times daily.     Zinc Sulfate (ZINC-220 PO) Take  by mouth daily at 6 (six) AM.     ezetimibe (ZETIA) 10 MG tablet Take 1 tablet (10 mg total) by mouth daily. (Patient not taking: Reported on 07/29/2022) 90 tablet 3   mesalamine (PENTASA) 500 MG CR capsule Take 1 capsule (500 mg total) by mouth 4 (four) times daily. (Patient not taking: Reported on 07/29/2022) 120 capsule 5   valsartan-hydrochlorothiazide (DIOVAN-HCT) 160-12.5 MG per tablet Take 1 tablet by mouth daily.  (Patient not taking: Reported on 07/29/2022)     No current facility-administered medications for this visit.    Review of Systems: GENERAL: negative for malaise, night sweats HEENT: No changes in hearing or vision, no nose bleeds or other nasal problems. NECK: Negative for lumps, goiter, pain and significant neck swelling RESPIRATORY: Negative for cough, wheezing CARDIOVASCULAR: Negative for chest pain, leg swelling, palpitations, orthopnea GI: SEE HPI MUSCULOSKELETAL: Negative for joint pain or swelling, back pain, and muscle pain. SKIN: Negative for lesions, rash PSYCH: Negative for sleep disturbance, mood  disorder and recent psychosocial stressors. HEMATOLOGY Negative for prolonged bleeding, bruising easily, and swollen nodes. ENDOCRINE: Negative for cold or heat intolerance, polyuria, polydipsia and goiter. NEURO: negative for tremor, gait imbalance, syncope and seizures. The remainder of the review of systems is noncontributory.   Physical Exam: BP 116/66 (BP Location: Left Arm, Patient Position: Sitting, Cuff Size: Large)   Pulse 86   Temp 98.9 F (37.2 C) (Oral)   Ht 5' 7"  (1.702 m)   Wt 209 lb 8 oz (95 kg)   BMI 32.81 kg/m  GENERAL: The patient is AO x3, in no acute distress. Obese. HEENT: Head is normocephalic and atraumatic. EOMI are intact. Mouth is well hydrated and without lesions. NECK: Supple. No masses LUNGS: Clear to auscultation. No presence of rhonchi/wheezing/rales. Adequate chest expansion HEART: RRR, normal s1 and s2. ABDOMEN: Soft, nontender, no guarding, no peritoneal signs, and nondistended. BS +. No masses. RECTAL EXAM: had presence of R perianal abscess with pinpintarea with minimal drainage, this was not tender but was fluctuant. Chaperone: Gustavus Bryant, CMA EXTREMITIES: Without any cyanosis, clubbing, rash, lesions or edema. NEUROLOGIC: AOx3, no focal motor deficit. SKIN: no jaundice, no rashes  Imaging/Labs: as above  I personally reviewed and interpreted the available labs, imaging and endoscopic files.  Impression and Plan: Elizabeth [name] is a 61 y.o. female with past medical history of ileocolonic  Crohn's disease status post ileocecectomy and partial colectomy, DVT on Xarelto, recurrent C. Diff, hypertension, GERD, obesity, who presents for follow up of Crohn's disease.  The patient has a very complex history of Crohn's disease that has required surgical resections of her small bowel and colon.  She failed treatment with Humira, and was intolerant to immunomodulators such as azathioprine.  She recently developed colonic abscess for which she received  antibiotic course and the abscess had resolved on repeat presentation abdominal imaging.  Clinically, she recently developed perianal abscess which seems to be improving with antibiotic treatment but may require a longer antibiotic course.  We will start her on metronidazole (she will need to stop Bactrim for now) for 3 more weeks but if her perianal disease does not improve she may need to have a surgical evaluation for possible drainage and seton placement.  Clinically, she has responded to her course of steroids and will finish her induction with [employer].  She will need to continue with Skyrizi maintenance dosage subcutaneously thereafter.  We will evaluate in her follow-up appointment if she presents any significant improvement of  her disease, but we will also need to proceed with a repeat colonoscopy in mid November to assess her endoscopic response and evaluate again the area showing changes consistent with sessile serrated lesion (she may need to have methylene blue chromoendoscopy at that time).  I emphasized to the patient the importance of quitting cigarette use as this will lead to treatment failure regardless of the medication she is using.  We had a thorough discussion about the importance of not smoking any cigarettes to achieve the best outcome.  She stated that she will try to work with her PCP on ways to achieve this.  - Patient was counseled regarding the importance of stopping cigarette smoking. The patient was informed about the long term effects of smoking, progression of current disease. - Schedule colonoscopy in mid November - Continue Skyrizi - next dose tomorrow then 360 mg SQ injections in 4 weeks and then every 8 weeks - Stop Bactrim and start Flagyl 500 mg every 8 hours for 3 weeks - Check CBC, CMP, CRP, iron panel  All questions were answered.      Harvel Quale, MD Gastroenterology and Hepatology [facility]

## 2022-07-30 ENCOUNTER — Other Ambulatory Visit (HOSPITAL_COMMUNITY): Payer: Self-pay

## 2022-07-30 ENCOUNTER — Ambulatory Visit (INDEPENDENT_AMBULATORY_CARE_PROVIDER_SITE_OTHER): Payer: 59

## 2022-07-30 VITALS — BP 101/65 | HR 60 | Temp 98.7°F | Resp 18 | Ht 67.0 in | Wt 210.0 lb

## 2022-07-30 DIAGNOSIS — K5792 Diverticulitis of intestine, part unspecified, without perforation or abscess without bleeding: Secondary | ICD-10-CM

## 2022-07-30 DIAGNOSIS — M62830 Muscle spasm of back: Secondary | ICD-10-CM

## 2022-07-30 DIAGNOSIS — R197 Diarrhea, unspecified: Secondary | ICD-10-CM

## 2022-07-30 DIAGNOSIS — K50118 Crohn's disease of large intestine with other complication: Secondary | ICD-10-CM | POA: Diagnosis not present

## 2022-07-30 DIAGNOSIS — F5101 Primary insomnia: Secondary | ICD-10-CM

## 2022-07-30 DIAGNOSIS — R109 Unspecified abdominal pain: Secondary | ICD-10-CM

## 2022-07-30 DIAGNOSIS — K432 Incisional hernia without obstruction or gangrene: Secondary | ICD-10-CM

## 2022-07-30 DIAGNOSIS — K5000 Crohn's disease of small intestine without complications: Secondary | ICD-10-CM

## 2022-07-30 DIAGNOSIS — R1032 Left lower quadrant pain: Secondary | ICD-10-CM

## 2022-07-30 DIAGNOSIS — E876 Hypokalemia: Secondary | ICD-10-CM

## 2022-07-30 DIAGNOSIS — R058 Other specified cough: Secondary | ICD-10-CM

## 2022-07-30 DIAGNOSIS — R110 Nausea: Secondary | ICD-10-CM

## 2022-07-30 DIAGNOSIS — K567 Ileus, unspecified: Secondary | ICD-10-CM

## 2022-07-30 DIAGNOSIS — R1013 Epigastric pain: Secondary | ICD-10-CM

## 2022-07-30 DIAGNOSIS — R7989 Other specified abnormal findings of blood chemistry: Secondary | ICD-10-CM

## 2022-07-30 DIAGNOSIS — K50819 Crohn's disease of both small and large intestine with unspecified complications: Secondary | ICD-10-CM

## 2022-07-30 DIAGNOSIS — R5383 Other fatigue: Secondary | ICD-10-CM

## 2022-07-30 DIAGNOSIS — A0472 Enterocolitis due to Clostridium difficile, not specified as recurrent: Secondary | ICD-10-CM

## 2022-07-30 DIAGNOSIS — K649 Unspecified hemorrhoids: Secondary | ICD-10-CM

## 2022-07-30 DIAGNOSIS — K5732 Diverticulitis of large intestine without perforation or abscess without bleeding: Secondary | ICD-10-CM

## 2022-07-30 DIAGNOSIS — E559 Vitamin D deficiency, unspecified: Secondary | ICD-10-CM | POA: Diagnosis not present

## 2022-07-30 DIAGNOSIS — R1031 Right lower quadrant pain: Secondary | ICD-10-CM

## 2022-07-30 DIAGNOSIS — K50018 Crohn's disease of small intestine with other complication: Secondary | ICD-10-CM

## 2022-07-30 DIAGNOSIS — K219 Gastro-esophageal reflux disease without esophagitis: Secondary | ICD-10-CM

## 2022-07-30 DIAGNOSIS — K6289 Other specified diseases of anus and rectum: Secondary | ICD-10-CM

## 2022-07-30 DIAGNOSIS — Z720 Tobacco use: Secondary | ICD-10-CM

## 2022-07-30 MED ORDER — RISANKIZUMAB-RZAA 600 MG/10ML IV SOLN
600.0000 mg | Freq: Once | INTRAVENOUS | Status: AC
  Administered 2022-07-30: 600 mg via INTRAVENOUS
  Filled 2022-07-30: qty 10

## 2022-07-30 NOTE — Progress Notes (Signed)
Diagnosis: Crohn's Disease  Provider:  Marshell Garfinkel MD  Procedure: Infusion  IV Type: Peripheral, IV Location: R Antecubital  skyrizi, Dose: 610m  Infusion Start Time: 14503am  Infusion Stop Time: 18882pm  Post Infusion IV Care: Peripheral IV Discontinued  Discharge: Condition: Good, Destination: Home . AVS provided to patient.   Performed by:  LAdelina Mings LPN

## 2022-08-02 ENCOUNTER — Other Ambulatory Visit (HOSPITAL_COMMUNITY): Payer: Self-pay

## 2022-08-02 ENCOUNTER — Telehealth (INDEPENDENT_AMBULATORY_CARE_PROVIDER_SITE_OTHER): Payer: Self-pay

## 2022-08-02 ENCOUNTER — Other Ambulatory Visit (INDEPENDENT_AMBULATORY_CARE_PROVIDER_SITE_OTHER): Payer: Self-pay | Admitting: Gastroenterology

## 2022-08-02 ENCOUNTER — Telehealth (INDEPENDENT_AMBULATORY_CARE_PROVIDER_SITE_OTHER): Payer: Self-pay | Admitting: *Deleted

## 2022-08-02 DIAGNOSIS — K50818 Crohn's disease of both small and large intestine with other complication: Secondary | ICD-10-CM

## 2022-08-02 MED ORDER — SKYRIZI 360 MG/2.4ML ~~LOC~~ SOCT
360.0000 mg | SUBCUTANEOUS | 7 refills | Status: AC
  Filled 2022-08-02: qty 2.4, fill #0
  Filled 2022-08-09 – 2022-08-20 (×2): qty 2.4, 56d supply, fill #0
  Filled 2022-09-29: qty 2.4, 56d supply, fill #1

## 2022-08-02 NOTE — Telephone Encounter (Signed)
Patient is changing insurance end of this month and was to reach out to me once she had new insurance.

## 2022-08-02 NOTE — Telephone Encounter (Signed)
Pt left vm that she went to lab and due to her insurance changing September 1st - the lab told her they could not draw labs. She wants new order put in with her insurance and mailed to her to do once insurance takes effect 08/13/22.

## 2022-08-02 NOTE — Telephone Encounter (Signed)
Tresa Res, RPH  [place], [place], [place]; Beatriz Chancellor, CPhT; Baker, Monchell R, CPhT; Wynetta Fines, [place],   Ms. Axom is getting her 3rd dose of Skyrizi infusion today at [facility]. Can you please send a [place] for the on body injection to [place] so our pharmacy team there can work on the [place] for it.   Thanks,  [employer]

## 2022-08-02 NOTE — Telephone Encounter (Signed)
I talked with patient and she will bring insurance card by when she gets the new one and I will redo orders.

## 2022-08-02 NOTE — Telephone Encounter (Signed)
Medication sent to pharmacy - 360 mg every 8 weeks (first dose 4 weeks after last infusion)

## 2022-08-03 ENCOUNTER — Other Ambulatory Visit (HOSPITAL_COMMUNITY): Payer: Self-pay

## 2022-08-04 ENCOUNTER — Telehealth: Payer: Self-pay | Admitting: Pharmacy Technician

## 2022-08-04 NOTE — Telephone Encounter (Signed)
Patient Advocate Encounter  Received notification that prior authorization for [facility] is required.   PA NOT submitted due to patient changing insurance at the end of month. Will complete PA when new information is provided for 9.1.23.    Luciano Cutter, CPhT Patient Advocate Phone: [phone]

## 2022-08-04 NOTE — Telephone Encounter (Signed)
I spoke with patient yesterday and she told me she would bring card by as soon as she gets it and I will let you know. thanks

## 2022-08-04 NOTE — Telephone Encounter (Signed)
-----   Message from [place] sent at 07/30/2022 11:37 AM EDT ----- Regarding: e-rx fro skyrizi onbody injector Hi Crystal,  Ms. Axom is getting her 3rd dose of Skyrizi infusion today at [facility]. Can you please send a [place] for the on body injection to [place] so our pharmacy team there can work on the [place] for it.  Thanks, [employer]

## 2022-08-05 ENCOUNTER — Telehealth (INDEPENDENT_AMBULATORY_CARE_PROVIDER_SITE_OTHER): Payer: Self-pay | Admitting: *Deleted

## 2022-08-05 NOTE — Telephone Encounter (Signed)
Patient called and states she missed a call this morning and thinks it was about scheduling her colonoscopy. She told me she wanted to hold off on this right now because her husband has cancer and has to take him to treatments. She will call back when ready to schedule.

## 2022-08-05 NOTE — Telephone Encounter (Signed)
Thanks for the update

## 2022-08-09 ENCOUNTER — Encounter: Payer: Self-pay | Admitting: Internal Medicine

## 2022-08-09 ENCOUNTER — Other Ambulatory Visit (HOSPITAL_COMMUNITY): Payer: Self-pay

## 2022-08-10 ENCOUNTER — Other Ambulatory Visit (HOSPITAL_COMMUNITY): Payer: Self-pay

## 2022-08-12 ENCOUNTER — Encounter: Payer: Self-pay | Admitting: Orthopaedic Surgery

## 2022-08-12 ENCOUNTER — Other Ambulatory Visit (INDEPENDENT_AMBULATORY_CARE_PROVIDER_SITE_OTHER): Payer: 59

## 2022-08-12 ENCOUNTER — Ambulatory Visit (INDEPENDENT_AMBULATORY_CARE_PROVIDER_SITE_OTHER): Payer: 59 | Admitting: Orthopaedic Surgery

## 2022-08-12 DIAGNOSIS — M7062 Trochanteric bursitis, left hip: Secondary | ICD-10-CM | POA: Diagnosis not present

## 2022-08-12 DIAGNOSIS — M7061 Trochanteric bursitis, right hip: Secondary | ICD-10-CM

## 2022-08-12 MED ORDER — METHYLPREDNISOLONE ACETATE 40 MG/ML IJ SUSP
40.0000 mg | Freq: Once | INTRAMUSCULAR | Status: AC
  Administered 2022-08-12: 40 mg via INTRA_ARTICULAR

## 2022-08-12 MED ORDER — HYDROCODONE-ACETAMINOPHEN 10-325 MG PO TABS
ORAL_TABLET | ORAL | 0 refills | Status: DC
Start: 2022-08-12 — End: 2022-10-12

## 2022-08-12 NOTE — Telephone Encounter (Signed)
Yes that would be great I can add the information in the system to process tomorrow morning. [phone]

## 2022-08-12 NOTE — Progress Notes (Signed)
PROCEDURE NOTE:  The patient request injection, verbal consent was obtained.  The right trochanteric area of the hip was prepped appropriately after time out was performed.   Sterile technique was observed and injection of 1 cc of DepoMedrol 47m with several cc's of plain xylocaine. Anesthesia was provided by ethyl chloride and a 20-gauge needle was used to inject the hip area. The injection was tolerated well.  A band aid dressing was applied.  The patient was advised to apply ice later today and tomorrow to the injection sight as needed.  PROCEDURE NOTE:  The patient request injection, verbal consent was obtained.  The left trochanteric area of the hip was prepped appropriately after time out was performed.   Sterile technique was observed and injection of 1 cc of DepoMedrol 42mwith several cc's of plain xylocaine. Anesthesia was provided by ethyl chloride and a 20-gauge needle was used to inject the hip area. The injection was tolerated well.  A band aid dressing was applied.  The patient was advised to apply ice later today and tomorrow to the injection sight as needed.  Encounter Diagnoses  Name Primary?   Trochanteric bursitis, right hip Yes   Trochanteric bursitis, left hip    Return in one month.  Call if any problem.  Precautions discussed.  Electronically Signed WaSanjuana KavaMD 8/31/202310:06 AM

## 2022-08-12 NOTE — Telephone Encounter (Signed)
I have Elizabeth [name]'s new insurance card but not able to scan into system til 9/1 when it takes effect. The person that does our scanning is out of office til Tuesday. Would you like me to fax it to you so you can have it for tomorrow when it goes into effect.

## 2022-08-12 NOTE — Telephone Encounter (Signed)
I just faxed it to you. Please let me know if you do not receive it. Thank you so much.

## 2022-08-13 ENCOUNTER — Encounter: Payer: Self-pay | Admitting: Internal Medicine

## 2022-08-13 ENCOUNTER — Other Ambulatory Visit (HOSPITAL_COMMUNITY): Payer: Self-pay

## 2022-08-13 ENCOUNTER — Other Ambulatory Visit (INDEPENDENT_AMBULATORY_CARE_PROVIDER_SITE_OTHER): Payer: Self-pay | Admitting: *Deleted

## 2022-08-13 DIAGNOSIS — K50818 Crohn's disease of both small and large intestine with other complication: Secondary | ICD-10-CM

## 2022-08-13 DIAGNOSIS — R7989 Other specified abnormal findings of blood chemistry: Secondary | ICD-10-CM

## 2022-08-13 NOTE — Progress Notes (Signed)
See phone message  - pharmacy will work on authorization

## 2022-08-13 NOTE — Telephone Encounter (Signed)
She has received and completed the induction doses. Thanks for the f/u.

## 2022-08-13 NOTE — Telephone Encounter (Signed)
I let patient know [facility] is now requiring appts and she wants to go to [facility]. Lab orders changed so new insurance will show on order. Pt will go to [facility] on Tuesday and I let her know orders will be put in.

## 2022-08-13 NOTE — Telephone Encounter (Signed)
Good Morning Monchell, I was able to get our receptionist to add the new insurance this morning. [employer] is the new insurance.

## 2022-08-17 ENCOUNTER — Telehealth: Payer: Self-pay | Admitting: Pharmacy Technician

## 2022-08-17 ENCOUNTER — Other Ambulatory Visit (HOSPITAL_COMMUNITY): Payer: Self-pay

## 2022-08-17 NOTE — Telephone Encounter (Signed)
Patient Advocate Encounter  Received notification that prior authorization for SKYRIZI 360MG is required.   PA submitted on 9.5.23 Key [id number] Status is pending    Luciano Cutter, CPhT Patient Advocate Phone: [phone]

## 2022-08-17 NOTE — Telephone Encounter (Signed)
Spoke with Monchell and she will do PA.

## 2022-08-17 NOTE — Telephone Encounter (Signed)
Patient called this morning and reports she spoke with [facility] at [facility] and was told that I  need to do prior auth for skyziri injections. I wanted to make sure this was correct before I send to [employer] to get approval that you were not going to work on it since I was under impression you were going to work on this. Let me know if this has changed so I can get sent in today. Thanks

## 2022-08-17 NOTE — Telephone Encounter (Signed)
PA has been submitted and telephone encounter has been created.

## 2022-08-18 ENCOUNTER — Other Ambulatory Visit (HOSPITAL_COMMUNITY): Payer: Self-pay

## 2022-08-19 ENCOUNTER — Other Ambulatory Visit (HOSPITAL_COMMUNITY): Payer: Self-pay

## 2022-08-19 NOTE — Telephone Encounter (Signed)
Patient called back to check on status of PA. She is due for her injection on 9/15.  I let her know it was still pending and she also wanted to let me know she could not get nurse ambassador to come out to house to show her how to do injection. [place]. I let her know I would call skyrizi and see what could be done.

## 2022-08-19 NOTE — Telephone Encounter (Signed)
Patient was notified.

## 2022-08-19 NOTE — Telephone Encounter (Signed)
Called skyrizi and was told pt was not enrolled and was able to enroll her on the phone today. Was told a nurse ambassador could come out to show her in person and she should get a call from them within one business day.

## 2022-08-20 ENCOUNTER — Other Ambulatory Visit (HOSPITAL_COMMUNITY): Payer: Self-pay

## 2022-08-20 NOTE — Telephone Encounter (Signed)
I let patient know med was approved. Will [facility] be able to fill this for her?

## 2022-08-20 NOTE — Telephone Encounter (Signed)
Patient Advocate Encounter  Prior Authorization for [place] has been approved.    PA# --- Effective dates: 9.5.23 through 9.4.24  Kristopher Delk B. CPhT P: [phone] F: [phone]

## 2022-08-21 ENCOUNTER — Other Ambulatory Visit (HOSPITAL_COMMUNITY): Payer: Self-pay

## 2022-08-23 ENCOUNTER — Encounter: Payer: Self-pay | Admitting: Internal Medicine

## 2022-08-23 ENCOUNTER — Other Ambulatory Visit (HOSPITAL_COMMUNITY): Payer: Self-pay

## 2022-08-23 NOTE — Telephone Encounter (Signed)
Left patient a message to return call to let her know. [facility] will fill for her and also to check to see if nurse ambassador from skyrizi got in touch with her for injection training.

## 2022-08-24 ENCOUNTER — Other Ambulatory Visit (HOSPITAL_COMMUNITY): Payer: Self-pay

## 2022-08-25 ENCOUNTER — Telehealth (INDEPENDENT_AMBULATORY_CARE_PROVIDER_SITE_OTHER): Payer: Self-pay | Admitting: *Deleted

## 2022-08-25 ENCOUNTER — Other Ambulatory Visit (HOSPITAL_COMMUNITY): Payer: Self-pay

## 2022-08-25 NOTE — Telephone Encounter (Signed)
Patient has her skryrizi injection and she told me skryrizi nurse ambassator will teach her how to inject through video and will be on phone with her

## 2022-08-25 NOTE — Telephone Encounter (Signed)
Patient let me know she had labs ordered by Dr. Jenetta Downer done at pcp. Dr. Quillian Quince. I told her we did not receive a copy and I would call his office to get a copy. I called and could not get through. I faxed request to their office asking for copy to be sent over.   Fax number [phone] Phone number [phone]

## 2022-08-25 NOTE — Telephone Encounter (Signed)
Patient told me she has her injection and nurse ambassator has sent her videos to watch and will be on the phone with her for her first injection.

## 2022-08-27 ENCOUNTER — Telehealth (INDEPENDENT_AMBULATORY_CARE_PROVIDER_SITE_OTHER): Payer: Self-pay

## 2022-08-27 NOTE — Telephone Encounter (Signed)
Patient called and left a message on nurse line at [facility]. Per patient she wants an antibiotic for a place on her buttocks before she goes on vacation. I called the patient did not get an answer. I called and left a voicemail and asked that she please call the office back with more information.

## 2022-08-27 NOTE — Telephone Encounter (Signed)
Received patient labs, placed the paper copy on [place] desk for her review when she comes back on Monday 08/30/2022.

## 2022-08-30 NOTE — Telephone Encounter (Signed)
Left patient another message to return call

## 2022-08-30 NOTE — Telephone Encounter (Signed)
Patient returned call after office was closed on Friday. I tried to call her this morning to get more information and left a message to return call on her cell number.

## 2022-08-31 NOTE — Telephone Encounter (Signed)
Labs received were for b12, folate and reticulocyte. ( Not the labs Dr. Jenetta Downer ordered) I called back to [facility] and asked for cbc, cmet, tibc, ferritin and crp. Results are on Dr. Colman Cater desk for review.

## 2022-09-01 NOTE — Telephone Encounter (Signed)
Left message to return call 

## 2022-09-03 ENCOUNTER — Encounter: Payer: Self-pay | Admitting: Internal Medicine

## 2022-09-07 ENCOUNTER — Other Ambulatory Visit (HOSPITAL_COMMUNITY): Payer: Self-pay

## 2022-09-18 ENCOUNTER — Telehealth (INDEPENDENT_AMBULATORY_CARE_PROVIDER_SITE_OTHER): Payer: Self-pay | Admitting: Gastroenterology

## 2022-09-18 NOTE — Telephone Encounter (Signed)
I received the results of the most recent blood work-up performed on 08/18/2022 which showed magnesium of 1.6, iron studies with iron saturation 38%, iron 110, ferritin 145, CRP less than 1, CBC with white blood cell count 10.5, hemoglobin 14.5, platelets 295, CMP sodium 140, potassium 4.0, creatinine 0.83, albumin 4.0, alkaline phosphatase 55, total bili 0.6, ALT 9.0, AST 10.0, lipid panel with triglycerides 159 rest of lipid panel within normal limits, vitamin Y72 897 normal, folic acid 7.1 normal.  Hi Crystal,  Can you please call the patient and tell the patient the CBC, CMP, iron studies, CRP, vitamin V15 and folic acid were within normal limits?  She needs to schedule her colonoscopy in mid November as we have previously discussed, please ask her if she is open to schedule this.  Thanks,  [name], MD Gastroenterology and Hepatology [facility]

## 2022-09-23 ENCOUNTER — Telehealth (INDEPENDENT_AMBULATORY_CARE_PROVIDER_SITE_OTHER): Payer: Self-pay | Admitting: *Deleted

## 2022-09-23 NOTE — Telephone Encounter (Signed)
Patient dropped off [employer] assist forms for assistance with skyrizi. Forms are completed and  I have placed on Dr. Colman Cater desk for review and signature

## 2022-09-23 NOTE — Telephone Encounter (Signed)
Thanks will sign forms

## 2022-09-23 NOTE — Telephone Encounter (Signed)
Will hold til November 1st then will fax in.

## 2022-09-27 ENCOUNTER — Telehealth (INDEPENDENT_AMBULATORY_CARE_PROVIDER_SITE_OTHER): Payer: Self-pay

## 2022-09-27 ENCOUNTER — Other Ambulatory Visit (INDEPENDENT_AMBULATORY_CARE_PROVIDER_SITE_OTHER): Payer: Self-pay | Admitting: Gastroenterology

## 2022-09-27 DIAGNOSIS — K50114 Crohn's disease of large intestine with abscess: Secondary | ICD-10-CM

## 2022-09-27 MED ORDER — METRONIDAZOLE 500 MG PO TABS: 500.0000 mg | ORAL_TABLET | Freq: Three times a day (TID) | ORAL | 0 refills | Status: AC

## 2022-09-27 MED ORDER — CIPROFLOXACIN HCL 500 MG PO TABS: 500.0000 mg | ORAL_TABLET | Freq: Two times a day (BID) | ORAL | 0 refills | Status: AC

## 2022-09-27 NOTE — Telephone Encounter (Signed)
Patient called today stating her perianal abcess has came back over the weekend. She says it is very painful and has had some leakage from it. She happened to have some left over antibiotics from December last year and took those for the last two days. Patient states she took 500 mg cipro bid for the last two days and has ran out of it, also took Flagyl 500 mg Qid for the last two days. Patient would like to know if she can have more antibiotic called in and says at the last office visit you had mentioned surgery. Please advise.

## 2022-09-27 NOTE — Telephone Encounter (Signed)
Spoke to the patient. I'm concerned she may have recurrent abscess due to perianal Crohn's. Will send 3 week course of ciprofloxacin and Flagyl, but will need imaging and possible surgical evaluation. I advised if her symptoms are not better she may need to be seen in the ED for abscess drainage and possible seton placement.  Hi Darius Bump,   Can you please schedule a MR abdomen pelvis ASAP? Dx: Crohns disease, perianal abscess.  Thanks,  [name], MD Gastroenterology and Hepatology [facility]

## 2022-09-28 ENCOUNTER — Telehealth (INDEPENDENT_AMBULATORY_CARE_PROVIDER_SITE_OTHER): Payer: Self-pay

## 2022-09-28 ENCOUNTER — Other Ambulatory Visit (INDEPENDENT_AMBULATORY_CARE_PROVIDER_SITE_OTHER): Payer: Self-pay

## 2022-09-28 DIAGNOSIS — K50114 Crohn's disease of large intestine with abscess: Secondary | ICD-10-CM

## 2022-09-28 MED ORDER — DIPHENHYDRAMINE HCL 50 MG PO TABS: 50.0000 mg | ORAL_TABLET | Freq: Every evening | ORAL | 0 refills | Status: DC | PRN

## 2022-09-28 MED ORDER — METHYLPREDNISOLONE 32 MG PO TABS: 32.0000 mg | ORAL_TABLET | Freq: Every day | ORAL | 0 refills | Status: DC

## 2022-09-28 NOTE — Telephone Encounter (Signed)
Thanks

## 2022-09-28 NOTE — Telephone Encounter (Signed)
Elizabeth [name], CMA  ?

## 2022-09-29 ENCOUNTER — Other Ambulatory Visit (HOSPITAL_COMMUNITY): Payer: Self-pay

## 2022-09-30 ENCOUNTER — Other Ambulatory Visit (HOSPITAL_COMMUNITY): Payer: Self-pay

## 2022-10-05 ENCOUNTER — Other Ambulatory Visit (HOSPITAL_COMMUNITY): Payer: Self-pay

## 2022-10-06 ENCOUNTER — Other Ambulatory Visit (HOSPITAL_COMMUNITY): Payer: Self-pay

## 2022-10-07 ENCOUNTER — Ambulatory Visit: Payer: 59 | Admitting: Orthopaedic Surgery

## 2022-10-11 ENCOUNTER — Encounter: Payer: Self-pay | Admitting: Internal Medicine

## 2022-10-12 ENCOUNTER — Ambulatory Visit (INDEPENDENT_AMBULATORY_CARE_PROVIDER_SITE_OTHER): Payer: BC Managed Care – PPO | Admitting: Orthopaedic Surgery

## 2022-10-12 ENCOUNTER — Encounter: Payer: Self-pay | Admitting: Internal Medicine

## 2022-10-12 ENCOUNTER — Encounter: Payer: Self-pay | Admitting: Orthopaedic Surgery

## 2022-10-12 DIAGNOSIS — M7061 Trochanteric bursitis, right hip: Secondary | ICD-10-CM

## 2022-10-12 DIAGNOSIS — M7062 Trochanteric bursitis, left hip: Secondary | ICD-10-CM | POA: Diagnosis not present

## 2022-10-12 MED ORDER — METHYLPREDNISOLONE ACETATE 40 MG/ML IJ SUSP
40.0000 mg | Freq: Once | INTRAMUSCULAR | Status: AC
  Administered 2022-10-12: 40 mg via INTRA_ARTICULAR

## 2022-10-12 MED ORDER — HYDROCODONE-ACETAMINOPHEN 10-325 MG PO TABS: ORAL_TABLET | ORAL | 0 refills | Status: DC

## 2022-10-12 NOTE — Telephone Encounter (Signed)
Forms faxed today and patient is aware

## 2022-10-12 NOTE — Addendum Note (Signed)
Addended by: Obie Dredge A on: 10/12/2022 10:31 AM   Modules accepted: Orders

## 2022-10-12 NOTE — Progress Notes (Signed)
PROCEDURE NOTE:  The patient request injection, verbal consent was obtained.  The right trochanteric area of the hip was prepped appropriately after time out was performed.   Sterile technique was observed and injection of 1 cc of DepoMedrol 40 mg with several cc's of plain xylocaine. Anesthesia was provided by ethyl chloride and a 20-gauge needle was used to inject the hip area. The injection was tolerated well.  A band aid dressing was applied.  The patient was advised to apply ice later today and tomorrow to the injection sight as needed.  PROCEDURE NOTE:  The patient request injection, verbal consent was obtained.  The left trochanteric area of the hip was prepped appropriately after time out was performed.   Sterile technique was observed and injection of 1 cc of DepoMedrol 40 mg with several cc's of plain xylocaine. Anesthesia was provided by ethyl chloride and a 20-gauge needle was used to inject the hip area. The injection was tolerated well.  A band aid dressing was applied.  The patient was advised to apply ice later today and tomorrow to the injection sight as needed.  Encounter Diagnoses  Name Primary?   Trochanteric bursitis, right hip Yes   Trochanteric bursitis, left hip    I have reviewed the [place] web site prior to prescribing narcotic medicine for this patient.  Return in one month.  Set up PT for hips, ultrasound  Call if any problem.  Precautions discussed.  Electronically Signed Sanjuana Kava, MD 10/31/202310:13 AM

## 2022-10-13 NOTE — Telephone Encounter (Signed)
Received fax from [employer] that updated form needed to be sent in. The one sent was not the updated one. Forms filled out and faxed in today. Patient is aware.

## 2022-10-15 ENCOUNTER — Other Ambulatory Visit (HOSPITAL_COMMUNITY): Payer: Self-pay

## 2022-10-18 ENCOUNTER — Encounter: Payer: Self-pay | Admitting: Internal Medicine

## 2022-10-18 ENCOUNTER — Encounter (INDEPENDENT_AMBULATORY_CARE_PROVIDER_SITE_OTHER): Payer: Self-pay | Admitting: *Deleted

## 2022-10-18 NOTE — Telephone Encounter (Signed)
Fax from [place] assist. Orson Ape is approved for assistance through 12/13/23

## 2022-10-18 NOTE — Telephone Encounter (Signed)
Patient notified

## 2022-10-19 ENCOUNTER — Ambulatory Visit (HOSPITAL_COMMUNITY)
Admission: RE | Admit: 2022-10-19 | Discharge: 2022-10-19 | Disposition: A | Discharge status: Home / Self Care | Payer: PPO | Source: Ambulatory Visit | Attending: Gastroenterology | Admitting: Gastroenterology

## 2022-10-19 ENCOUNTER — Encounter: Payer: Self-pay | Admitting: Internal Medicine

## 2022-10-19 DIAGNOSIS — N83202 Unspecified ovarian cyst, left side: Secondary | ICD-10-CM | POA: Diagnosis not present

## 2022-10-19 DIAGNOSIS — K50114 Crohn's disease of large intestine with abscess: Secondary | ICD-10-CM | POA: Diagnosis not present

## 2022-10-19 DIAGNOSIS — N281 Cyst of kidney, acquired: Secondary | ICD-10-CM | POA: Diagnosis not present

## 2022-10-19 DIAGNOSIS — K449 Diaphragmatic hernia without obstruction or gangrene: Secondary | ICD-10-CM | POA: Diagnosis not present

## 2022-10-19 DIAGNOSIS — K5000 Crohn's disease of small intestine without complications: Secondary | ICD-10-CM | POA: Diagnosis not present

## 2022-10-19 DIAGNOSIS — K6389 Other specified diseases of intestine: Secondary | ICD-10-CM | POA: Diagnosis not present

## 2022-10-19 MED ORDER — GLUCAGON HCL RDNA (DIAGNOSTIC) 1 MG IJ SOLR
1.0000 mg | Freq: Once | INTRAMUSCULAR | Status: AC
  Administered 2022-10-19: 1 mg via INTRAVENOUS

## 2022-10-19 MED ORDER — GADOBUTROL 1 MMOL/ML IV SOLN
7.0000 mL | Freq: Once | INTRAVENOUS | Status: AC | PRN
  Administered 2022-10-19: 7 mL via INTRAVENOUS

## 2022-10-19 MED ORDER — GLUCAGON HCL RDNA (DIAGNOSTIC) 1 MG IJ SOLR
INTRAMUSCULAR | Status: AC
  Filled 2022-10-19: qty 1

## 2022-10-20 ENCOUNTER — Ambulatory Visit: Payer: PPO | Attending: Orthopaedic Surgery | Admitting: Physical Therapy

## 2022-10-20 ENCOUNTER — Other Ambulatory Visit: Payer: Self-pay

## 2022-10-20 ENCOUNTER — Encounter: Payer: Self-pay | Admitting: Physical Therapy

## 2022-10-20 DIAGNOSIS — M25551 Pain in right hip: Secondary | ICD-10-CM | POA: Insufficient documentation

## 2022-10-20 DIAGNOSIS — M6281 Muscle weakness (generalized): Secondary | ICD-10-CM | POA: Diagnosis not present

## 2022-10-20 DIAGNOSIS — M25552 Pain in left hip: Secondary | ICD-10-CM | POA: Insufficient documentation

## 2022-10-20 DIAGNOSIS — M7062 Trochanteric bursitis, left hip: Secondary | ICD-10-CM | POA: Diagnosis not present

## 2022-10-20 DIAGNOSIS — M7061 Trochanteric bursitis, right hip: Secondary | ICD-10-CM | POA: Diagnosis not present

## 2022-10-20 NOTE — Therapy (Signed)
OUTPATIENT PHYSICAL THERAPY LOWER EXTREMITY EVALUATION   Patient Name: Elizabeth [name] MRN: [id number] DOB:1961-10-24, 61 y.o., female Today's Date: 10/20/2022   PT End of Session - 10/20/22 1143     Visit Number 1    Number of Visits 8    Date for PT Re-Evaluation 11/17/22    Authorization Type FOTO AT LEAST EVERY 5TH VISIT.  PROGRESS NOTE AT 10TH VISIT.  KX MODIFIER AFTER 15 VISITS.    PT Start Time 1115    Activity Tolerance Patient tolerated treatment well    Behavior During Therapy [facility] for tasks assessed/performed             Past Medical History:  Diagnosis Date   Bronchitis    C. difficile diarrhea    Cough 01/28/2014   upper airway cough syndrome   Crohn's disease ([place])    Diverticulitis    DVT 06/2020   history of   Elevated transaminase level    GERD (gastroesophageal reflux disease)    Hypertension    Obesity    Pneumonia 12/2013   Tobacco abuse    Past Surgical History:  Procedure Laterality Date   ABDOMINAL HYSTERECTOMY  12/13/1978   APPENDECTOMY     BIOPSY  11/11/2021   Procedure: BIOPSY;  Surgeon: Rogene Houston, MD;  [facility];  Service: Endoscopy;;  bx of polyp and small bowel   COLON SURGERY     COLONOSCOPY     COLONOSCOPY N/A 08/16/2014   Procedure: COLONOSCOPY;  Surgeon: Rogene Houston, MD;  [facility];  Service: Endoscopy;  Laterality: N/A;  1200-rescheduled to 9/4 @ 10:45 Ann notified pt   COLONOSCOPY WITH PROPOFOL N/A 11/11/2021   Procedure: COLONOSCOPY WITH PROPOFOL;  Surgeon: Rogene Houston, MD;  [facility];  Service: Endoscopy;  Laterality: N/A;  2:05   ESOPHAGOGASTRODUODENOSCOPY  06/16/2012   Procedure: ESOPHAGOGASTRODUODENOSCOPY (EGD);  Surgeon: Rogene Houston, MD;  [facility];  Service: Endoscopy;  Laterality: N/A;  10:30 AM   hemocolectomy  12/14/2003   right   Illeocecal Resection  2005   INCISIONAL HERNIA REPAIR N/A 08/24/2021   Procedure: HERNIA REPAIR INCISIONAL WITH  MESH;  Surgeon: Aviva Signs, MD;  [facility];  Service: General;  Laterality: N/A;   LAPAROSCOPIC SIGMOID COLECTOMY N/A 01/07/2015   Procedure: LOW ANTERIOR COLON RESECTION;  Surgeon: Fanny Skates, MD;  Location: [place];  Service: General;  Laterality: N/A;   SHOULDER ARTHROSCOPY WITH LABRAL REPAIR Right 01/20/2016   Procedure: SHOULDER ARTHROSCOPY WITH LABRAL REPAIR;  Surgeon: Meredith Pel, MD;  Location: [place];  Service: Orthopedics;  Laterality: Right;   Patient Active Problem List   Diagnosis Date Noted   Perianal Crohn's disease, with abscess ([place]) 07/29/2022   Vitamin D deficiency 01/21/2022   Ileus ([place]) 12/24/2021   Crohn's colitis ([place]) 12/21/2021   Hypomagnesemia 12/21/2021   LLQ abdominal pain    Abdominal pain 10/06/2021   Incisional hernia, without obstruction or gangrene    Abnormal LFTs 07/14/2021   Abdominal wall pain 03/31/2021   Crohn's disease of both small and large intestine ([place]) 12/02/2020   Diarrhea 09/02/2020   C. difficile diarrhea 08/07/2020   Back spasm 05/20/2020   Rectal pain 11/20/2019   Hemorrhoids 11/20/2019   Crohn's disease ([place]) 04/19/2019   Acute bronchitis 11/29/2017   Primary insomnia 04/08/2017   Strain of back 04/08/2017   Diverticulitis large intestine 01/07/2015   Diverticulitis 07/03/2014   Hypokalemia 07/03/2014   Tobacco  abuse    Obesity    Upper airway cough syndrome 01/28/2014   IBS (irritable bowel syndrome) 07/10/2013   Acute bilateral lower abdominal pain 03/26/2013   Tiredness 03/26/2013   Sigmoid diverticulitis 09/12/2012   Epigastric pain 05/22/2012   Nausea without vomiting 01/28/2012   GERD (gastroesophageal reflux disease) 01/10/2012   HTN (hypertension) 01/10/2012   REFERRING PROVIDER: Sanjuana Kava MD  REFERRING DIAG: Bilateral trochanteric bursitis  THERAPY DIAG:  Pain in right hip  Pain in left hip  Rationale for Evaluation and Treatment Rehabilitation  ONSET DATE: 12/2021.  SUBJECTIVE:    SUBJECTIVE STATEMENT: The patient returns to the clinic having recently received bilateral hip injections with a very good response.  In fact, at rest she has no pain.  She states her left side is worse than the right and it is painful to sleep on the left side.  Overexertion increases her pain and she is trying to limit activities since her injections.  PERTINENT HISTORY: HTN, right shoulder surgery.  PAIN:  Are you having pain? No. PRECAUTIONS: None  WEIGHT BEARING RESTRICTIONS No  FALLS:  Has patient fallen in last 6 months? No  LIVING ENVIRONMENT:          House/apartment  Has following equipment at home: None  OCCUPATION: Disabled.  PLOF: Independent with basic ADLs  PATIENT GOALS Get out of pain.  Walk better and do more.   OBJECTIVE:   PATIENT SURVEYS:  FOTO Complete.  PALPATION:  Tender over patient's left TFL and greater trochanter.  Patient reporting no palpable pain over right hip today. LOWER EXTREMITY ROM:  Normal bilateral hip range of motion.  LOWER EXTREMITY MMT:  Patient able to to provide a 4+/5 bilateral hip abduction strength grade GAIT: Continued Trendelenburg-type gait cycle.  TODAY'S TREATMENT: IFC at 80-150 Hz on 40% scan to patient's left hip x 20 minutes.  Normal modality response following removal of modality.   ASSESSMENT:  CLINICAL IMPRESSION: The patient returns to [facility] with bilateral hip pain.  She had an injection on 10/12/22 that was very helpful.  Her CC today is left hip pain that increases with activity.  She is tender to palpation over her left TFL and greater trochanter. Bilateral hip abduction strength is 4+/5. She continues to exhibit a Trendelburg gait but is well aware of it and she is trying to normalize her gait pattern.   Patient will benefit from skilled physical therapy intervention to address pain and deficits.   OBJECTIVE IMPAIRMENTS Abnormal gait, decreased activity tolerance, difficulty walking, and pain.    ACTIVITY LIMITATIONS standing and sleeping  PARTICIPATION LIMITATIONS: cleaning, laundry, and yard work  [employer] POTENTIAL: Good  CLINICAL DECISION MAKING: Stable/uncomplicated  EVALUATION COMPLEXITY: Low   GOALS:   LONG TERM GOALS: Target date: 12/01/2022   Ind with HEP. Baseline:  Goal status: INITIAL  2.  Sleep 6 hours undisturbed. Baseline:  Goal status: INITIAL  3.  Perform ADL's with pain not > 3/10. Baseline:  Goal status: INITIAL  4.  Walk with a normal gait pattern. Baseline:  Goal status: INITIAL  PLAN: PT FREQUENCY: 2x/week  PT DURATION: 6 weeks  PLANNED INTERVENTIONS: [place], E'stim, STW/M, TE, dry needling.  PLAN FOR NEXT SESSION: Patient would like to avoid exercise at this point.  We did discuss instructing her in supine hip abduction with theraband for a HEP though.  Patient would like treatments focused on left hip.  [place], STW/M.  Araminta Zorn, [place], PT 10/20/2022, 11:44 AM

## 2022-10-22 ENCOUNTER — Ambulatory Visit: Payer: PPO | Admitting: *Deleted

## 2022-10-22 ENCOUNTER — Encounter: Payer: Self-pay | Admitting: *Deleted

## 2022-10-22 DIAGNOSIS — M25552 Pain in left hip: Secondary | ICD-10-CM

## 2022-10-22 DIAGNOSIS — M6281 Muscle weakness (generalized): Secondary | ICD-10-CM

## 2022-10-22 DIAGNOSIS — M25551 Pain in right hip: Secondary | ICD-10-CM

## 2022-10-22 NOTE — Therapy (Signed)
OUTPATIENT PHYSICAL THERAPY LOWER EXTREMITY EVALUATION   Patient Name: Elizabeth [name] MRN: [id number] DOB:Oct 12, 1961, 61 y.o., female Today's Date: 10/22/2022   PT End of Session - 10/22/22 1106     Visit Number 2    Number of Visits 8    Date for PT Re-Evaluation 11/17/22    Authorization Type FOTO AT LEAST EVERY 5TH VISIT.  PROGRESS NOTE AT 10TH VISIT.  KX MODIFIER AFTER 15 VISITS.    PT Start Time 1030    PT Stop Time 1114    PT Time Calculation (min) 44 min             Past Medical History:  Diagnosis Date   Bronchitis    C. difficile diarrhea    Cough 01/28/2014   upper airway cough syndrome   Crohn's disease ([place])    Diverticulitis    DVT 06/2020   history of   Elevated transaminase level    GERD (gastroesophageal reflux disease)    Hypertension    Obesity    Pneumonia 12/2013   Tobacco abuse    Past Surgical History:  Procedure Laterality Date   ABDOMINAL HYSTERECTOMY  12/13/1978   APPENDECTOMY     BIOPSY  11/11/2021   Procedure: BIOPSY;  Surgeon: Rogene Houston, MD;  [facility];  Service: Endoscopy;;  bx of polyp and small bowel   COLON SURGERY     COLONOSCOPY     COLONOSCOPY N/A 08/16/2014   Procedure: COLONOSCOPY;  Surgeon: Rogene Houston, MD;  [facility];  Service: Endoscopy;  Laterality: N/A;  1200-rescheduled to 9/4 @ 10:45 Ann notified pt   COLONOSCOPY WITH PROPOFOL N/A 11/11/2021   Procedure: COLONOSCOPY WITH PROPOFOL;  Surgeon: Rogene Houston, MD;  [facility];  Service: Endoscopy;  Laterality: N/A;  2:05   ESOPHAGOGASTRODUODENOSCOPY  06/16/2012   Procedure: ESOPHAGOGASTRODUODENOSCOPY (EGD);  Surgeon: Rogene Houston, MD;  [facility];  Service: Endoscopy;  Laterality: N/A;  10:30 AM   hemocolectomy  12/14/2003   right   Illeocecal Resection  2005   INCISIONAL HERNIA REPAIR N/A 08/24/2021   Procedure: HERNIA REPAIR INCISIONAL WITH MESH;  Surgeon: Aviva Signs, MD;  [facility];   Service: General;  Laterality: N/A;   LAPAROSCOPIC SIGMOID COLECTOMY N/A 01/07/2015   Procedure: LOW ANTERIOR COLON RESECTION;  Surgeon: Fanny Skates, MD;  Location: [place];  Service: General;  Laterality: N/A;   SHOULDER ARTHROSCOPY WITH LABRAL REPAIR Right 01/20/2016   Procedure: SHOULDER ARTHROSCOPY WITH LABRAL REPAIR;  Surgeon: Meredith Pel, MD;  Location: [place];  Service: Orthopedics;  Laterality: Right;   Patient Active Problem List   Diagnosis Date Noted   Perianal Crohn's disease, with abscess ([place]) 07/29/2022   Vitamin D deficiency 01/21/2022   Ileus ([place]) 12/24/2021   Crohn's colitis ([place]) 12/21/2021   Hypomagnesemia 12/21/2021   LLQ abdominal pain    Abdominal pain 10/06/2021   Incisional hernia, without obstruction or gangrene    Abnormal LFTs 07/14/2021   Abdominal wall pain 03/31/2021   Crohn's disease of both small and large intestine ([place]) 12/02/2020   Diarrhea 09/02/2020   C. difficile diarrhea 08/07/2020   Back spasm 05/20/2020   Rectal pain 11/20/2019   Hemorrhoids 11/20/2019   Crohn's disease ([place]) 04/19/2019   Acute bronchitis 11/29/2017   Primary insomnia 04/08/2017   Strain of back 04/08/2017   Diverticulitis large intestine 01/07/2015   Diverticulitis 07/03/2014   Hypokalemia 07/03/2014   Tobacco abuse  Obesity    Upper airway cough syndrome 01/28/2014   IBS (irritable bowel syndrome) 07/10/2013   Acute bilateral lower abdominal pain 03/26/2013   Tiredness 03/26/2013   Sigmoid diverticulitis 09/12/2012   Epigastric pain 05/22/2012   Nausea without vomiting 01/28/2012   GERD (gastroesophageal reflux disease) 01/10/2012   HTN (hypertension) 01/10/2012   REFERRING PROVIDER: Sanjuana Kava MD  REFERRING DIAG: Bilateral trochanteric bursitis  THERAPY DIAG:  Pain in right hip  Pain in left hip  Muscle weakness (generalized)  Rationale for Evaluation and Treatment Rehabilitation  ONSET DATE: 12/2021.  SUBJECTIVE:   SUBJECTIVE  STATEMENT: LT hip pain 2/10 still.   Need to leave by 11:15 today  PERTINENT HISTORY: HTN, right shoulder surgery.  PAIN:  Are you having pain? No. PRECAUTIONS: None  WEIGHT BEARING RESTRICTIONS No  FALLS:  Has patient fallen in last 6 months? No  LIVING ENVIRONMENT:          House/apartment  Has following equipment at home: None  OCCUPATION: Disabled.  PLOF: Independent with basic ADLs  PATIENT GOALS Get out of pain.  Walk better and do more.   OBJECTIVE:   PATIENT SURVEYS:  FOTO Complete.  PALPATION:  Tender over patient's left TFL and greater trochanter.  Patient reporting no palpable pain over right hip today. LOWER EXTREMITY ROM:  Normal bilateral hip range of motion.  LOWER EXTREMITY MMT:  Patient able to to provide a 4+/5 bilateral hip abduction strength grade GAIT: Continued Trendelenburg-type gait cycle.  TODAY'S TREATMENT:                   US/ combo x 12 mins LT hip 1.5w/cm2 with Pt hooklying               Manual:  STW to LT hip musculature and ITB with Pt hooklying x 8 mis                          FC at 80-150 Hz on 40% scan to patient's left hip x 15 minutes.  Normal modality response following removal of modality.   ASSESSMENT:  CLINICAL IMPRESSION:    Pt arrived today doing fairly well with LT hip 2/10, but reports very tender and how hard it is to  move her LT LE to get out of the car. Rx focused on LT hip with [place] combo, STW, and IFC and Pt did very well. Notable tenderness above LT hip during STW.       Pt OBJECTIVE IMPAIRMENTS Abnormal gait, decreased activity tolerance, difficulty walking, and pain.   ACTIVITY LIMITATIONS standing and sleeping  PARTICIPATION LIMITATIONS: cleaning, laundry, and yard work  [employer] POTENTIAL: Good  CLINICAL DECISION MAKING: Stable/uncomplicated  EVALUATION COMPLEXITY: Low   GOALS:   LONG TERM GOALS: Target date: 12/03/2022   Ind with HEP. Baseline:  Goal status: INITIAL  2.  Sleep 6 hours  undisturbed. Baseline:  Goal status: INITIAL  3.  Perform ADL's with pain not > 3/10. Baseline:  Goal status: INITIAL  4.  Walk with a normal gait pattern. Baseline:  Goal status: INITIAL  PLAN: PT FREQUENCY: 2x/week  PT DURATION: 6 weeks  PLANNED INTERVENTIONS: [place], E'stim, STW/M, TE, dry needling.  PLAN FOR NEXT SESSION: Patient would like to avoid exercise at this point.  We did discuss instructing her in supine hip abduction with theraband for a HEP though.  Patient would like treatments focused on left hip.  [place], STW/M.  Rashena Dowling,CHRIS, PTA 10/22/2022,  11:33 AM

## 2022-10-25 ENCOUNTER — Telehealth (INDEPENDENT_AMBULATORY_CARE_PROVIDER_SITE_OTHER): Payer: Self-pay | Admitting: *Deleted

## 2022-10-25 ENCOUNTER — Other Ambulatory Visit (INDEPENDENT_AMBULATORY_CARE_PROVIDER_SITE_OTHER): Payer: Self-pay | Admitting: Gastroenterology

## 2022-10-25 ENCOUNTER — Encounter: Payer: Self-pay | Admitting: Internal Medicine

## 2022-10-25 ENCOUNTER — Other Ambulatory Visit (INDEPENDENT_AMBULATORY_CARE_PROVIDER_SITE_OTHER): Payer: Self-pay | Admitting: *Deleted

## 2022-10-25 DIAGNOSIS — K50818 Crohn's disease of both small and large intestine with other complication: Secondary | ICD-10-CM

## 2022-10-25 DIAGNOSIS — K610 Anal abscess: Secondary | ICD-10-CM

## 2022-10-25 IMAGING — MR MR ^MR ENTEROGRAPHY W/WO
22 series · 48 of 48 positions shown · IV contrast (9 ml gadavist)
Comparison: Abdomen/pelvis CT 12/24/2021
COMPARISON: Abdomen/pelvis CT 12/24/2021

Addendum:
CLINICAL DATA: Crohn disease with nausea vomiting.  Abdominal pain.

EXAM:
MR ABDOMEN AND PELVIS WITHOUT AND WITH CONTRAST (MR ENTEROGRAPHY)
TECHNIQUE: Multiplanar, multisequence MRI of the abdomen and pelvis was
performed both before and during bolus administration of intravenous
contrast. Negative oral contrast VoLumen was given.
CONTRAST:  7mL GADAVIST GADOBUTROL 1 MMOL/ML IV SOLN

[Series 8: ax haste · axial · 6.0mm · 1.25mm/px · z∈[-290,+135]mm · 2 of 60 slices shown]
[im 1/60]
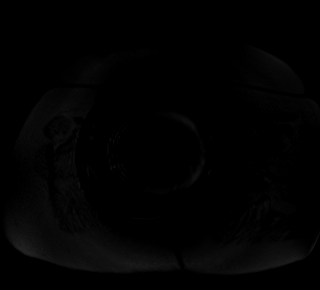
[im 60/60]
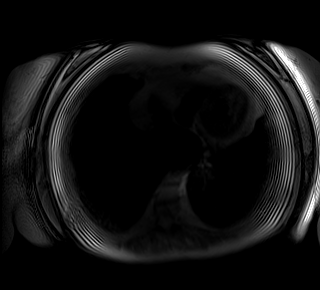

[Series 9: cor haste · coronal · 6.0mm · 1.38mm/px · 2 of 38 slices shown]
[im 1/38]
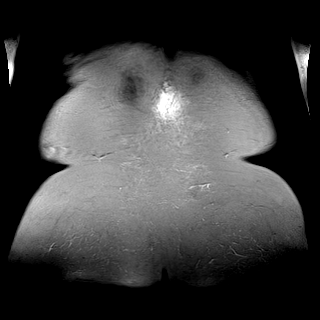
[im 38/38]
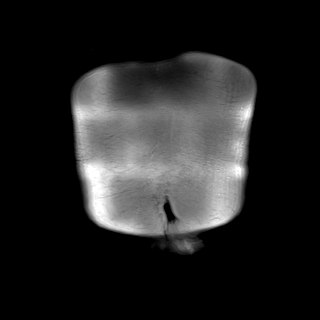

[Series 10: DWI · axial · 6.0mm · 1.42mm/px · z∈[-150,+138]mm · 2 of 42 slices shown (1 of 8)]
[im 1/42]
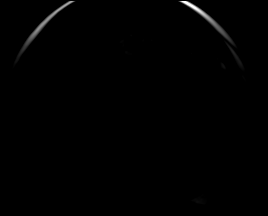
[im 42/42]
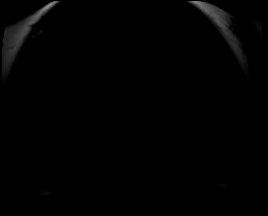

[Series 10: DWI · axial · 6.0mm · 1.42mm/px · z∈[-150,+138]mm · 2 of 42 slices shown (2 of 8)]
[im 1/42]
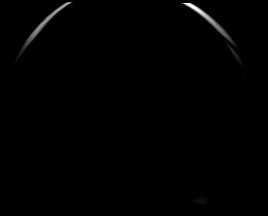
[im 42/42]
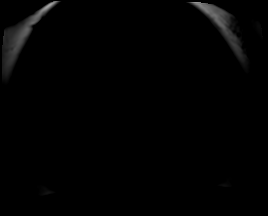

[Series 10: DWI · axial · 6.0mm · 1.42mm/px · 1 of 42 slices shown (3 of 8)]
[im 1/42]
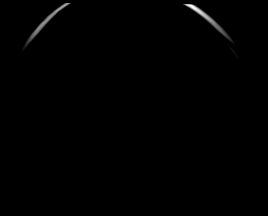

[Series 11: DWI · axial · 6.0mm · 1.42mm/px · 1 of 42 slices shown (4 of 8)]
[im 1/42]
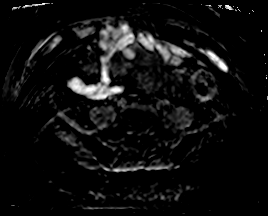

[Series 12: DWI · axial · 6.0mm · 1.49mm/px · 1 of 42 slices shown (5 of 8)]
[im 1/42]
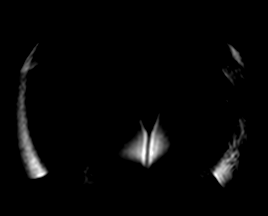

[Series 12: DWI · axial · 6.0mm · 1.49mm/px · 1 of 42 slices shown (6 of 8)]
[im 1/42]
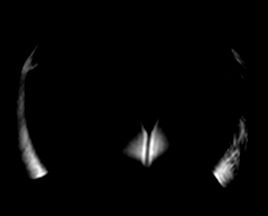

[Series 12: DWI · axial · 6.0mm · 1.49mm/px · 1 of 42 slices shown (7 of 8)]
[im 1/42]
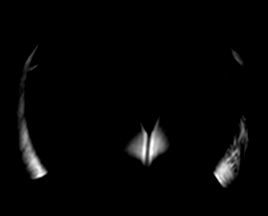

[Series 13: DWI · axial · 6.0mm · 1.49mm/px · 1 of 42 slices shown (8 of 8)]
[im 1/42]
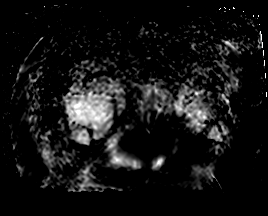

[Series 14: bSSFP · coronal · 6.0mm · 0.70mm/px · 1 of 45 slices shown]
[im 1/45]
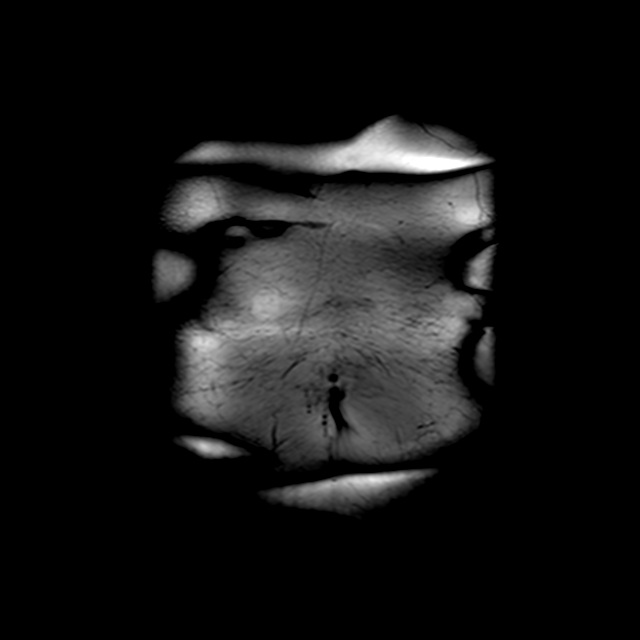

[Series 15: T1 dynamic · axial · 3.0mm · 1.72mm/px · z∈[-293,+136]mm · 3 of 144 slices shown (1 of 11)]
[im 1/144]
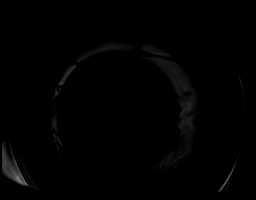
[im 72/144]
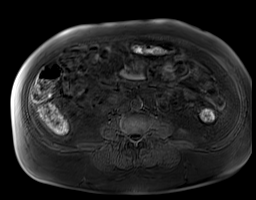
[im 144/144]
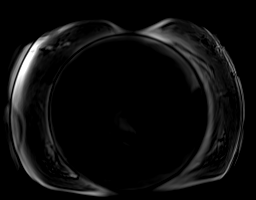

[Series 16: T1 dynamic · axial · 3.0mm · 1.72mm/px · z∈[-293,+136]mm · 3 of 144 slices shown (2 of 11)]
[im 1/144]
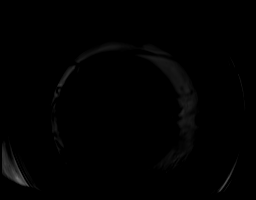
[im 72/144]
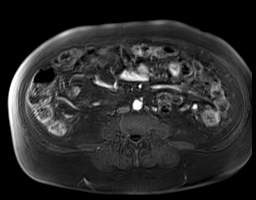
[im 144/144]
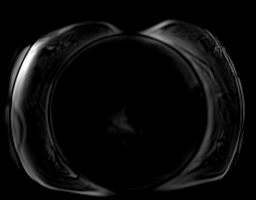

[Series 17: T1 dynamic · axial · 3.0mm · 1.72mm/px · z∈[-293,+136]mm · 3 of 143 slices shown (3 of 11)]
[im 1/143]
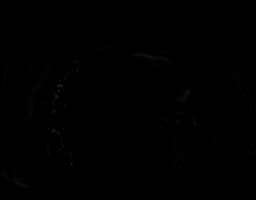
[im 72/143]
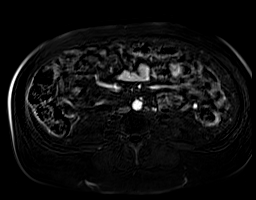
[im 143/143]
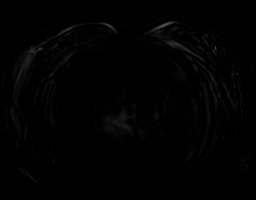

[Series 18: T1 dynamic · axial · 3.0mm · 1.72mm/px · z∈[-293,+136]mm · 3 of 144 slices shown (4 of 11)]
[im 1/144]
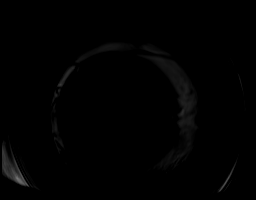
[im 72/144]
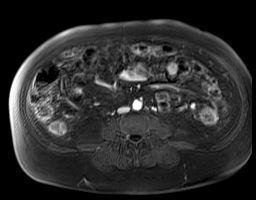
[im 144/144]
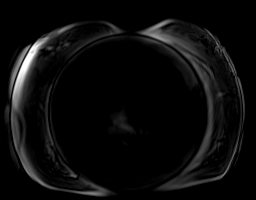

[Series 19: T1 dynamic · axial · 3.0mm · 1.72mm/px · z∈[-293,+136]mm · 3 of 144 slices shown (5 of 11)]
[im 1/144]
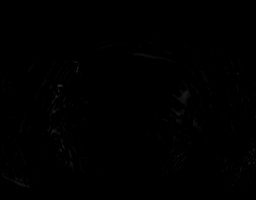
[im 72/144]
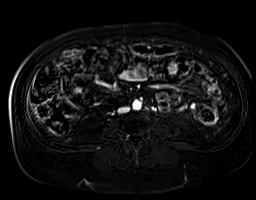
[im 144/144]
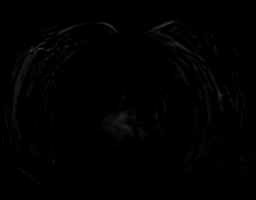

[Series 20: T1 dynamic · axial · 3.0mm · 1.72mm/px · z∈[-293,+136]mm · 3 of 144 slices shown (6 of 11)]
[im 1/144]
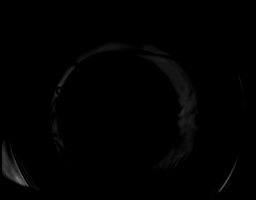
[im 72/144]
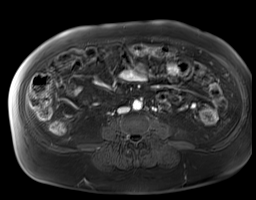
[im 144/144]
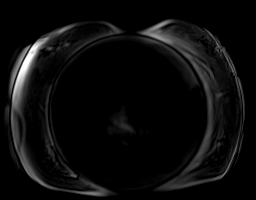

[Series 21: T1 dynamic · axial · 3.0mm · 1.72mm/px · z∈[-293,+136]mm · 3 of 144 slices shown (7 of 11)]
[im 1/144]
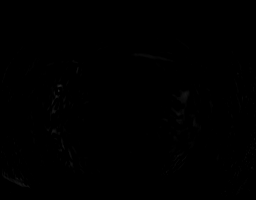
[im 72/144]
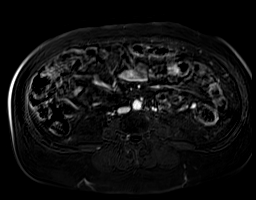
[im 144/144]
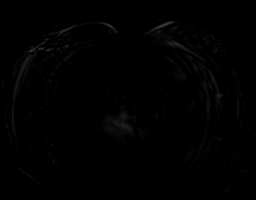

[Series 22: T1 dynamic · coronal · 1.6mm · 1.76mm/px · 3 of 144 slices shown (8 of 11)]
[im 1/144]
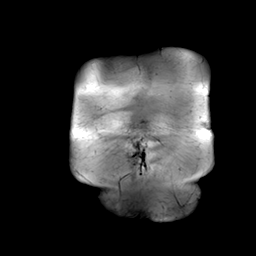
[im 72/144]
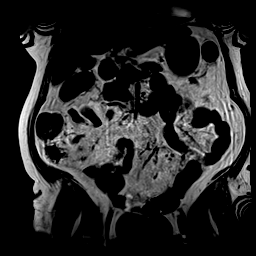
[im 144/144]
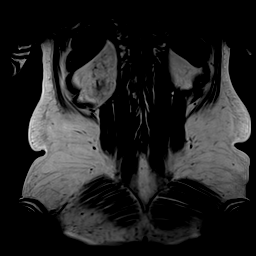

[Series 23: T1 dynamic · coronal · 1.6mm · 1.76mm/px · 3 of 144 slices shown (9 of 11)]
[im 1/144]
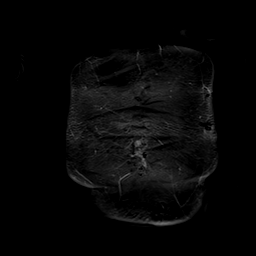
[im 72/144]
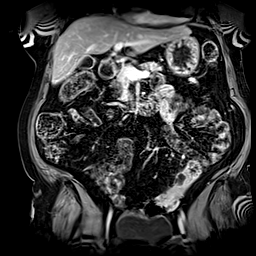
[im 144/144]
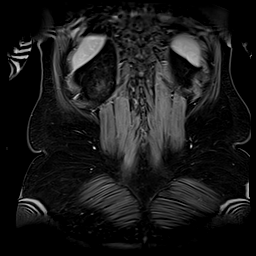

[Series 24: T1 dynamic · axial · 3.0mm · 1.72mm/px · z∈[-293,+136]mm · 3 of 144 slices shown (10 of 11)]
[im 1/144]
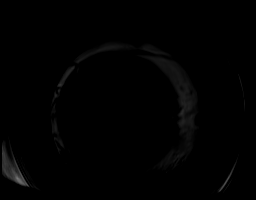
[im 72/144]
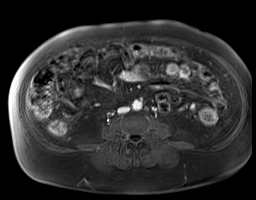
[im 144/144]
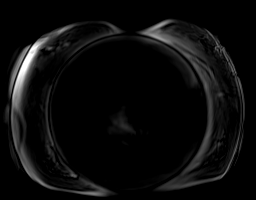

[Series 25: T1 dynamic · axial · 3.0mm · 1.72mm/px · z∈[-293,+136]mm · 3 of 144 slices shown (11 of 11)]
[im 1/144]
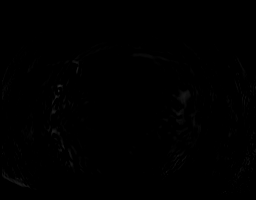
[im 72/144]
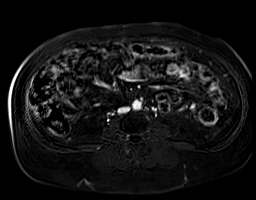
[im 144/144]
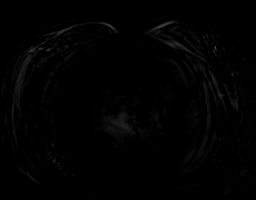

[48 of 48 positions shown; findings below may reference images not displayed]

FINDINGS: COMBINED FINDINGS FOR BOTH MR ABDOMEN AND PELVIS

Lower chest: Unremarkable.

Hepatobiliary: No suspicious focal abnormality within the liver
parenchyma. There is no evidence for gallstones, gallbladder wall
thickening, or pericholecystic fluid. No intrahepatic or
extrahepatic biliary dilation.

Pancreas: No focal mass lesion. No dilatation of the main duct. No
intraparenchymal cyst. No peripancreatic edema.

Spleen:  No splenomegaly. No focal mass lesion.

Adrenals/Urinary Tract: No adrenal nodule or mass. Kidneys
unremarkable. No evidence for hydroureter. The urinary bladder
appears normal for the degree of distention.

Stomach/Bowel: Stomach is unremarkable. No gastric wall thickening.
No evidence of outlet obstruction. Duodenum is normally positioned
as is the ligament of Treitz. Duodenal diverticulum noted. No
abnormal mucosal or transmural hyperenhancement in the duodenum.
Jejunal loops are unremarkable. Patient is status post ileoceectomy.
Neo terminal ileum is nondilated. There is mild mucosal
hyperenhancement in the neo terminal ileum (postcontrast axial
images 91-109/16). No substantial perienteric edema or inflammation
in this region.

Scattered areas of mucosal hyperenhancement are seen in the colon
including proximal transverse segment (48/16) distal transverse
segment (50/16) and notably in the distal descending colon where
there is apparent increased enhancing soft tissue in the
mucosal/submucosal region, borderline for transmural
hyperenhancement (see image 104/16). These changes are just proximal
to the redundant/patulous rectosigmoid anastomosis. No substantial
pericolonic edema/inflammation in this region.

Vascular/Lymphatic: No abdominal aortic aneurysm. No abdominal
lymphadenopathy. No pelvic lymphadenopathy.

Reproductive: Hysterectomy.  There is no adnexal mass.

Other:  No intraperitoneal free fluid.

Musculoskeletal: No focal suspicious marrow enhancement within the
visualized bony anatomy. There is a tiny right paraumbilical hernia
containing only fat, better visualized on CT scan from 12/24/2021.
IMPRESSION: 1. Mucosal hyperenhancement in the neo terminal ileum, compatible
with active inflammation. No substantial perienteric edema or
inflammation in this region. No findings to suggest fistula or
abscess. There is no dilatation of neo terminal ileum proximal to
the area of mucosal hyperenhancement to suggest inflammatory
stricture.
2. Scattered areas of mucosal hyperenhancement in the colon
including the proximal transverse segment, distal transverse
segment, and distal descending colon. Abnormal enhancement is most
prominent in the distal descending colon with questionable
transmural hyperenhancement just proximal to the redundant/patulous
rectosigmoid anastomosis. Imaging findings are compatible with
active inflammation. No colonic dilatation proximal to this region
to suggest inflammatory stricture.

ADDENDUM:
Dr. Kun just called me and asked if there could be a small
abscess along the left colon. There is indeed a 1.7 x 1.0 cm T2
hyperintense lesion along the proximal sigmoid colon (axial T2 haste
image 43 of series 8 and postcontrast coronal image 74/23). This is
immediately adjacent to the area of described apparent transmural
hyperenhancement in the region of the rectosigmoid anastomosis. This
was not present on the 11/11/2020 MRI. Although there is not a
substantial amount of edema or inflammation around this collection,
a small para colonic abscess is not excluded.

*** End of Addendum ***
FINDINGS: COMBINED FINDINGS FOR BOTH MR ABDOMEN AND PELVIS

Lower chest: Unremarkable.

Hepatobiliary: No suspicious focal abnormality within the liver
parenchyma. There is no evidence for gallstones, gallbladder wall
thickening, or pericholecystic fluid. No intrahepatic or
extrahepatic biliary dilation.

Pancreas: No focal mass lesion. No dilatation of the main duct. No
intraparenchymal cyst. No peripancreatic edema.

Spleen:  No splenomegaly. No focal mass lesion.

Adrenals/Urinary Tract: No adrenal nodule or mass. Kidneys
unremarkable. No evidence for hydroureter. The urinary bladder
appears normal for the degree of distention.

Stomach/Bowel: Stomach is unremarkable. No gastric wall thickening.
No evidence of outlet obstruction. Duodenum is normally positioned
as is the ligament of Treitz. Duodenal diverticulum noted. No
abnormal mucosal or transmural hyperenhancement in the duodenum.
Jejunal loops are unremarkable. Patient is status post ileoceectomy.
Neo terminal ileum is nondilated. There is mild mucosal
hyperenhancement in the neo terminal ileum (postcontrast axial
images 91-109/16). No substantial perienteric edema or inflammation
in this region.

Scattered areas of mucosal hyperenhancement are seen in the colon
including proximal transverse segment (48/16) distal transverse
segment (50/16) and notably in the distal descending colon where
there is apparent increased enhancing soft tissue in the
mucosal/submucosal region, borderline for transmural
hyperenhancement (see image 104/16). These changes are just proximal
to the redundant/patulous rectosigmoid anastomosis. No substantial
pericolonic edema/inflammation in this region.

Vascular/Lymphatic: No abdominal aortic aneurysm. No abdominal
lymphadenopathy. No pelvic lymphadenopathy.

Reproductive: Hysterectomy.  There is no adnexal mass.

Other:  No intraperitoneal free fluid.

Musculoskeletal: No focal suspicious marrow enhancement within the
visualized bony anatomy. There is a tiny right paraumbilical hernia
containing only fat, better visualized on CT scan from 12/24/2021.
IMPRESSION: 1. Mucosal hyperenhancement in the neo terminal ileum, compatible
with active inflammation. No substantial perienteric edema or
inflammation in this region. No findings to suggest fistula or
abscess. There is no dilatation of neo terminal ileum proximal to
the area of mucosal hyperenhancement to suggest inflammatory
stricture.
2. Scattered areas of mucosal hyperenhancement in the colon
including the proximal transverse segment, distal transverse
segment, and distal descending colon. Abnormal enhancement is most
prominent in the distal descending colon with questionable
transmural hyperenhancement just proximal to the redundant/patulous
rectosigmoid anastomosis. Imaging findings are compatible with
active inflammation. No colonic dilatation proximal to this region
to suggest inflammatory stricture.

## 2022-10-25 MED ORDER — HYDROCODONE-ACETAMINOPHEN 10-325 MG PO TABS: ORAL_TABLET | ORAL | 0 refills | Status: DC

## 2022-10-25 NOTE — Telephone Encounter (Signed)
Patient called and reports she finished flagyl and cipro last week for Crohn's related colonic abscess. She was seen in August for this and reports she took med for 21 days and abscess came back last week. She is asking for referral to Dr. Arnoldo Morale and antibiotic and something for pain. Reports she takes hydrocodone 10/325 that Dr. Luna Glasgow gives her for her bursitis in her hip and she rarely takes but has used the ones he gave her and asking for a refill due to the pain. I also discussed her recent results with her and she wants to wait til January to get colonoscopy.   [employer]  [phone]

## 2022-10-25 NOTE — Telephone Encounter (Signed)
Discussed with patient per Dr. Jenetta Downer - At this point she needs to have the abscess drained (antibiotics will not do too much until it is drained), will send the referral ASAP to Dr. Cam Hai office, I will send a refill for pain medication for  5 days.  If the abscess does get worse and she cannot wait to see Dr. Arnoldo Morale or any of his partners, then she will need to go to the ER to get it drained. Patient aware referral was place as urgent for  perianal abscess.

## 2022-10-25 NOTE — Telephone Encounter (Signed)
At this point she needs to have the abscess drained (antibiotics will not do too much until it is drained), will send the referral ASAP to Dr. Cam Hai office, I will send a refill for pain medication for  5 days.  If the abscess does get worse and she cannot wait to see Dr. Arnoldo Morale or any of his partners, then she will need to go to the ER to get it drained. Ann, can you please refer the patient to Dr. Cam Hai office ASAP? Dx: perianal abscess.  Thanks,   [name], MD Gastroenterology and Hepatology [facility]

## 2022-10-25 NOTE — Telephone Encounter (Signed)
Referral has been placed in [place] as Urgent for Dr Arnoldo Morale office, they will contact patient with apt

## 2022-10-26 ENCOUNTER — Telehealth: Payer: Self-pay | Admitting: *Deleted

## 2022-10-26 NOTE — Telephone Encounter (Signed)
   10/26/22  Elizabeth [name] April 03, 1961  What type of surgery is being performed? Colonoscopy   When is surgery scheduled? TBD in January  Clearance to hold xarelto x 48 hours  Name of physician performing surgery?  Dr. Rubye Oaks [facility] at [facility] Phone: [phone] Fax: [phone]  Anethesia type (none, local, MAC, general)? mac

## 2022-10-27 ENCOUNTER — Ambulatory Visit: Payer: PPO | Admitting: Physical Therapy

## 2022-10-27 DIAGNOSIS — M25551 Pain in right hip: Secondary | ICD-10-CM

## 2022-10-27 DIAGNOSIS — M6281 Muscle weakness (generalized): Secondary | ICD-10-CM

## 2022-10-27 DIAGNOSIS — M25552 Pain in left hip: Secondary | ICD-10-CM

## 2022-10-27 NOTE — Telephone Encounter (Signed)
Patient with diagnosis of hx of DVT on Xarelto for anticoagulation.    Procedure: Colonoscopy Date of procedure: TBD in January   CHA2DS2-VASc Score = 3   This indicates a 3.2% annual risk of stroke. The patient's score is based upon: CHF History: 0 HTN History: 1 Diabetes History: 0 Stroke History: 0 Vascular Disease History: 1 Age Score: 0 Gender Score: 1    CrCl 75 ml/min (SrCr 0.91 01/22/2022) Platelet count 188 (01/22/2022)  Per office protocol, patient can hold Xarelto for 2 days prior to procedure.   Patient will not need bridging with Lovenox (enoxaparin) around procedure.  **This guidance is not considered finalized until pre-operative APP has relayed final recommendations.**

## 2022-10-27 NOTE — Therapy (Signed)
OUTPATIENT PHYSICAL THERAPY LOWER EXTREMITY EVALUATION   Patient Name: Elizabeth [name] MRN: [id number] DOB:09/29/1961, 61 y.o., female Today's Date: 10/27/2022   PT End of Session - 10/27/22 0951     Visit Number 3    Number of Visits 8    Date for PT Re-Evaluation 11/17/22    Authorization Type FOTO AT LEAST EVERY 5TH VISIT.  PROGRESS NOTE AT 10TH VISIT.  KX MODIFIER AFTER 15 VISITS.    PT Start Time 0900    Activity Tolerance Patient tolerated treatment well    Behavior During Therapy [facility] for tasks assessed/performed             Past Medical History:  Diagnosis Date   Bronchitis    C. difficile diarrhea    Cough 01/28/2014   upper airway cough syndrome   Crohn's disease ([place])    Diverticulitis    DVT 06/2020   history of   Elevated transaminase level    GERD (gastroesophageal reflux disease)    Hypertension    Obesity    Pneumonia 12/2013   Tobacco abuse    Past Surgical History:  Procedure Laterality Date   ABDOMINAL HYSTERECTOMY  12/13/1978   APPENDECTOMY     BIOPSY  11/11/2021   Procedure: BIOPSY;  Surgeon: Rogene Houston, MD;  [facility];  Service: Endoscopy;;  bx of polyp and small bowel   COLON SURGERY     COLONOSCOPY     COLONOSCOPY N/A 08/16/2014   Procedure: COLONOSCOPY;  Surgeon: Rogene Houston, MD;  [facility];  Service: Endoscopy;  Laterality: N/A;  1200-rescheduled to 9/4 @ 10:45 Ann notified pt   COLONOSCOPY WITH PROPOFOL N/A 11/11/2021   Procedure: COLONOSCOPY WITH PROPOFOL;  Surgeon: Rogene Houston, MD;  [facility];  Service: Endoscopy;  Laterality: N/A;  2:05   ESOPHAGOGASTRODUODENOSCOPY  06/16/2012   Procedure: ESOPHAGOGASTRODUODENOSCOPY (EGD);  Surgeon: Rogene Houston, MD;  [facility];  Service: Endoscopy;  Laterality: N/A;  10:30 AM   hemocolectomy  12/14/2003   right   Illeocecal Resection  2005   INCISIONAL HERNIA REPAIR N/A 08/24/2021   Procedure: HERNIA REPAIR INCISIONAL WITH  MESH;  Surgeon: Aviva Signs, MD;  Location: [facility];  Service: General;  Laterality: N/A;   LAPAROSCOPIC SIGMOID COLECTOMY N/A 01/07/2015   Procedure: LOW ANTERIOR COLON RESECTION;  Surgeon: Fanny Skates, MD;  Location: [place];  Service: General;  Laterality: N/A;   SHOULDER ARTHROSCOPY WITH LABRAL REPAIR Right 01/20/2016   Procedure: SHOULDER ARTHROSCOPY WITH LABRAL REPAIR;  Surgeon: Meredith Pel, MD;  Location: [place];  Service: Orthopedics;  Laterality: Right;   Patient Active Problem List   Diagnosis Date Noted   Perianal Crohn's disease, with abscess ([place]) 07/29/2022   Vitamin D deficiency 01/21/2022   Ileus ([place]) 12/24/2021   Crohn's colitis ([place]) 12/21/2021   Hypomagnesemia 12/21/2021   LLQ abdominal pain    Abdominal pain 10/06/2021   Incisional hernia, without obstruction or gangrene    Abnormal LFTs 07/14/2021   Abdominal wall pain 03/31/2021   Crohn's disease of both small and large intestine ([place]) 12/02/2020   Diarrhea 09/02/2020   C. difficile diarrhea 08/07/2020   Back spasm 05/20/2020   Rectal pain 11/20/2019   Hemorrhoids 11/20/2019   Crohn's disease ([place]) 04/19/2019   Acute bronchitis 11/29/2017   Primary insomnia 04/08/2017   Strain of back 04/08/2017   Diverticulitis large intestine 01/07/2015   Diverticulitis 07/03/2014   Hypokalemia 07/03/2014   Tobacco  abuse    Obesity    Upper airway cough syndrome 01/28/2014   IBS (irritable bowel syndrome) 07/10/2013   Acute bilateral lower abdominal pain 03/26/2013   Tiredness 03/26/2013   Sigmoid diverticulitis 09/12/2012   Epigastric pain 05/22/2012   Nausea without vomiting 01/28/2012   GERD (gastroesophageal reflux disease) 01/10/2012   HTN (hypertension) 01/10/2012   REFERRING PROVIDER: Sanjuana Kava MD  REFERRING DIAG: Bilateral trochanteric bursitis  THERAPY DIAG:  Pain in right hip  Pain in left hip  Muscle weakness (generalized)  Rationale for Evaluation and Treatment  Rehabilitation  ONSET DATE: 12/2021.  SUBJECTIVE:   SUBJECTIVE STATEMENT: LT hip pain 2/10 still.    PATIENT GOALS Get out of pain.  Walk better and do more.   OBJECTIVE:    TODAY'S TREATMENT:                   [place] x 12 mins LT hip 1.5w/cm2 with Pt hooklying               Manual:  STW to LT hip musculature and ITB with Pt hooklying x 11 mins                          IFC at 80-150 Hz on 40% scan to patient's left hip x 15 minutes.  Normal modality response following removal of modality.   ASSESSMENT:  CLINICAL IMPRESSION:    Patient doing well today.  She was palpably tender over her left TFL/glut med area.  She was provided with a dry needling consent form.  She responded very well to treatment today with normal modality response following removal of modality.    GOALS:   LONG TERM GOALS: Target date: 12/08/2022   Ind with HEP. Baseline:  Goal status: INITIAL  2.  Sleep 6 hours undisturbed. Baseline:  Goal status: INITIAL  3.  Perform ADL's with pain not > 3/10. Baseline:  Goal status: INITIAL  4.  Walk with a normal gait pattern. Baseline:  Goal status: INITIAL  PLAN: PT FREQUENCY: 2x/week  PT DURATION: 6 weeks  PLANNED INTERVENTIONS: [place], E'stim, STW/M, TE, dry needling.  PLAN FOR NEXT SESSION: Patient would like to avoid exercise at this point.  We did discuss instructing her in supine hip abduction with theraband for a HEP though.  Patient would like treatments focused on left hip.  [place], STW/M.  Jaquel Glassburn, [place], PT 10/27/2022, 10:02 AM

## 2022-10-28 ENCOUNTER — Encounter: Payer: Self-pay | Admitting: General Surgery

## 2022-10-28 ENCOUNTER — Ambulatory Visit (INDEPENDENT_AMBULATORY_CARE_PROVIDER_SITE_OTHER): Payer: PPO | Admitting: General Surgery

## 2022-10-28 VITALS — BP 92/68 | HR 96 | Temp 98.2°F | Resp 16 | Ht 67.0 in | Wt 207.0 lb

## 2022-10-28 DIAGNOSIS — K610 Anal abscess: Secondary | ICD-10-CM

## 2022-10-28 NOTE — Telephone Encounter (Signed)
   Patient Name: Elizabeth [name]  DOB: 11/16/1961 MRN: [id number]  Primary Cardiologist: None  Clinical pharmacists have reviewed the patient's past medical history, labs, and current medications as part of preoperative protocol coverage. 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     Procedure: Colonoscopy Date of procedure: TBD in January     CHA2DS2-VASc Score = 3  This indicates a 3.2% annual risk of stroke. The patient's score is based upon: CHF History: 0 HTN History: 1 Diabetes History: 0 Stroke History: 0 Vascular Disease History: 1 Age Score: 0 Gender Score: 1     CrCl 75 ml/min (SrCr 0.91 01/22/2022) Platelet count 188 (01/22/2022)   Per office protocol, patient can hold Xarelto for 2 days prior to procedure.   Patient will not need bridging with Lovenox (enoxaparin) around procedure. Please resume Xarelto as soon as possible postprocedure, at the discretion of the surgeon.    I will route this recommendation to the requesting party via Epic fax function and remove from pre-op pool.  Please call with questions.  Lenna Sciara, NP 10/28/2022, 11:35 AM

## 2022-10-28 NOTE — Progress Notes (Signed)
Elizabeth [name]; [id number]; Dec 01, 1961   HPI Patient is a 61yo WF who was referred to my care by Dr. Jenetta Downer for evaluation and treatment of a perianal abscess.  Has had three episodes of swelling and drainage requiring antibiotic treatment.  Is not currently draining and only complains of swelling.  Never passes air or stool in wound.  Does have Crohn's disease which is currently well controlled with medication.  Is on Xarelto. Past Medical History:  Diagnosis Date   Bronchitis    C. difficile diarrhea    Cough 01/28/2014   upper airway cough syndrome   Crohn's disease ([place])    Diverticulitis    DVT 06/2020   history of   Elevated transaminase level    GERD (gastroesophageal reflux disease)    Hypertension    Obesity    Pneumonia 12/2013   Tobacco abuse     Past Surgical History:  Procedure Laterality Date   ABDOMINAL HYSTERECTOMY  12/13/1978   APPENDECTOMY     BIOPSY  11/11/2021   Procedure: BIOPSY;  Surgeon: Rogene Houston, MD;  [facility];  Service: Endoscopy;;  bx of polyp and small bowel   COLON SURGERY     COLONOSCOPY     COLONOSCOPY N/A 08/16/2014   Procedure: COLONOSCOPY;  Surgeon: Rogene Houston, MD;  [facility];  Service: Endoscopy;  Laterality: N/A;  1200-rescheduled to 9/4 @ 10:45 Ann notified pt   COLONOSCOPY WITH PROPOFOL N/A 11/11/2021   Procedure: COLONOSCOPY WITH PROPOFOL;  Surgeon: Rogene Houston, MD;  [facility];  Service: Endoscopy;  Laterality: N/A;  2:05   ESOPHAGOGASTRODUODENOSCOPY  06/16/2012   Procedure: ESOPHAGOGASTRODUODENOSCOPY (EGD);  Surgeon: Rogene Houston, MD;  [facility];  Service: Endoscopy;  Laterality: N/A;  10:30 AM   hemocolectomy  12/14/2003   right   Illeocecal Resection  2005   INCISIONAL HERNIA REPAIR N/A 08/24/2021   Procedure: HERNIA REPAIR INCISIONAL WITH MESH;  Surgeon: Aviva Signs, MD;  [facility];  Service: General;  Laterality: N/A;   LAPAROSCOPIC SIGMOID  COLECTOMY N/A 01/07/2015   Procedure: LOW ANTERIOR COLON RESECTION;  Surgeon: Fanny Skates, MD;  Location: [place];  Service: General;  Laterality: N/A;   SHOULDER ARTHROSCOPY WITH LABRAL REPAIR Right 01/20/2016   Procedure: SHOULDER ARTHROSCOPY WITH LABRAL REPAIR;  Surgeon: Meredith Pel, MD;  Location: [place];  Service: Orthopedics;  Laterality: Right;    Family History  Problem Relation Age of Onset   Healthy Mother    Lung cancer Father        smoked   Allergic Disorder Brother    Healthy Daughter     Current Outpatient Medications on File Prior to Visit  Medication Sig Dispense Refill   albuterol (VENTOLIN HFA) 108 (90 Base) MCG/ACT inhaler Inhale 2 puffs into the lungs every 6 (six) hours as needed for wheezing or shortness of breath.     Ascorbic Acid (VITAMIN C) 1000 MG tablet Take 1,000 mg by mouth every evening.     ERGOCALCIFEROL PO Take by mouth. 2,000 units per day.     HYDROcodone-acetaminophen (NORCO) 10-325 MG tablet One-half tablet every four hours as needed for pain. 10 tablet 0   methylPREDNISolone (MEDROL) 32 MG tablet Take 1 tablet (32 mg total) by mouth daily. (Patient not taking: Reported on 10/28/2022) 3 tablet 0   omeprazole (PRILOSEC) 40 MG capsule Take 40 mg by mouth daily before breakfast.     potassium chloride (MICRO-K) 10  MEQ CR capsule Take 10 mEq by mouth 2 (two) times daily.      promethazine (PHENERGAN) 25 MG tablet TAKE 1 TABLET BY MOUTH TWICE A DAY AS NEEDED 20 tablet 0   Risankizumab-rzaa (SKYRIZI) 360 MG/2.4ML SOCT Inject 360 mg into the skin every 8 (eight) weeks. 2.4 mL 7   rivaroxaban (XARELTO) 20 MG TABS tablet Take 1 tablet (20 mg total) by mouth daily with supper. 30 tablet    valsartan-hydrochlorothiazide (DIOVAN-HCT) 160-12.5 MG per tablet Take 1 tablet by mouth in the morning.     zinc sulfate 220 (50 Zn) MG capsule Take 1 capsule by mouth every evening.     No current facility-administered medications on file prior to visit.     Allergies  Allergen Reactions   Contrast Media [Iodinated Contrast Media] Hives and Rash   Penicillins Hives and Rash             Social History   Substance and Sexual Activity  Alcohol Use Not Currently   Alcohol/week: 0.0 standard drinks of alcohol   Comment: occ    Social History   Tobacco Use  Smoking Status Every Day   Packs/day: 0.25   Years: 40.00   Total pack years: 10.00   Types: Cigarettes   Passive exposure: Current  Smokeless Tobacco Never  Tobacco Comments   increase smoking , very nervous    Review of Systems  Constitutional: Negative.   HENT: Negative.    Eyes: Negative.   Respiratory: Negative.    Cardiovascular: Negative.   Gastrointestinal:  Positive for heartburn.  Genitourinary: Negative.   Musculoskeletal: Negative.   Skin: Negative.   Neurological: Negative.   Endo/Heme/Allergies:  Bruises/bleeds easily.  Psychiatric/Behavioral: Negative.      Objective   Vitals:   10/28/22 1118  BP: 92/68  Pulse: 96  Resp: 16  Temp: 98.2 F (36.8 C)  SpO2: 95%    Physical Exam Vitals reviewed.  Constitutional:      Appearance: Normal appearance. She is not ill-appearing.  HENT:     Head: Normocephalic and atraumatic.  Cardiovascular:     Rate and Rhythm: Normal rate and regular rhythm.     Heart sounds: Normal heart sounds. No murmur heard.    No friction rub. No gallop.  Pulmonary:     Effort: Pulmonary effort is normal. No respiratory distress.     Breath sounds: Normal breath sounds. No stridor. No wheezing, rhonchi or rales.  Chest:     Chest wall: No tenderness.  Genitourinary:    Comments: Palpable tunneling from just outside the anal verge posteriorly extending to the dentate in a straight line.  No open wound noted. Skin:    General: Skin is warm and dry.  Neurological:     Mental Status: She is alert and oriented to person, place, and time.    MRI report reviewed. Assessment  Resovling perianal abscess,  ?fistula Crohn's disease, On xeralto Plan  Patient is scheduled for incision and drainage of perianal abscess, possible fistulotomy on 11/10/22.  Should it resolve, patient will call to cancel. Risks and benefits of the procedure including bleeding, infection, and recurrence were fully explained to the patient who gives informed consent.

## 2022-10-29 ENCOUNTER — Ambulatory Visit: Payer: PPO | Admitting: *Deleted

## 2022-10-29 ENCOUNTER — Encounter: Payer: Self-pay | Admitting: *Deleted

## 2022-10-29 ENCOUNTER — Encounter (INDEPENDENT_AMBULATORY_CARE_PROVIDER_SITE_OTHER): Payer: Self-pay | Admitting: *Deleted

## 2022-10-29 DIAGNOSIS — M6281 Muscle weakness (generalized): Secondary | ICD-10-CM

## 2022-10-29 DIAGNOSIS — M25551 Pain in right hip: Secondary | ICD-10-CM | POA: Diagnosis not present

## 2022-10-29 DIAGNOSIS — M25552 Pain in left hip: Secondary | ICD-10-CM

## 2022-10-29 NOTE — Therapy (Signed)
OUTPATIENT PHYSICAL THERAPY LOWER EXTREMITY EVALUATION   Patient Name: Elizabeth [name] MRN: [id number] DOB:02-01-1961, 61 y.o., female Today's Date: 10/29/2022   PT End of Session - 10/29/22 0950     Visit Number 4    Number of Visits 8    Date for PT Re-Evaluation 11/17/22    Authorization Type FOTO AT LEAST EVERY 5TH VISIT.  PROGRESS NOTE AT 10TH VISIT.  KX MODIFIER AFTER 15 VISITS.    PT Start Time 0945    PT Stop Time 1036    PT Time Calculation (min) 51 min             Past Medical History:  Diagnosis Date   Bronchitis    C. difficile diarrhea    Cough 01/28/2014   upper airway cough syndrome   Crohn's disease ([place])    Diverticulitis    DVT 06/2020   history of   Elevated transaminase level    GERD (gastroesophageal reflux disease)    Hypertension    Obesity    Pneumonia 12/2013   Tobacco abuse    Past Surgical History:  Procedure Laterality Date   ABDOMINAL HYSTERECTOMY  12/13/1978   APPENDECTOMY     BIOPSY  11/11/2021   Procedure: BIOPSY;  Surgeon: Rogene Houston, MD;  [facility];  Service: Endoscopy;;  bx of polyp and small bowel   COLON SURGERY     COLONOSCOPY     COLONOSCOPY N/A 08/16/2014   Procedure: COLONOSCOPY;  Surgeon: Rogene Houston, MD;  [facility];  Service: Endoscopy;  Laterality: N/A;  1200-rescheduled to 9/4 @ 10:45 Ann notified pt   COLONOSCOPY WITH PROPOFOL N/A 11/11/2021   Procedure: COLONOSCOPY WITH PROPOFOL;  Surgeon: Rogene Houston, MD;  [facility];  Service: Endoscopy;  Laterality: N/A;  2:05   ESOPHAGOGASTRODUODENOSCOPY  06/16/2012   Procedure: ESOPHAGOGASTRODUODENOSCOPY (EGD);  Surgeon: Rogene Houston, MD;  [facility];  Service: Endoscopy;  Laterality: N/A;  10:30 AM   hemocolectomy  12/14/2003   right   Illeocecal Resection  2005   INCISIONAL HERNIA REPAIR N/A 08/24/2021   Procedure: HERNIA REPAIR INCISIONAL WITH MESH;  Surgeon: Aviva Signs, MD;  [facility];   Service: General;  Laterality: N/A;   LAPAROSCOPIC SIGMOID COLECTOMY N/A 01/07/2015   Procedure: LOW ANTERIOR COLON RESECTION;  Surgeon: Fanny Skates, MD;  Location: [place];  Service: General;  Laterality: N/A;   SHOULDER ARTHROSCOPY WITH LABRAL REPAIR Right 01/20/2016   Procedure: SHOULDER ARTHROSCOPY WITH LABRAL REPAIR;  Surgeon: Meredith Pel, MD;  Location: [place];  Service: Orthopedics;  Laterality: Right;   Patient Active Problem List   Diagnosis Date Noted   Perianal Crohn's disease, with abscess ([place]) 07/29/2022   Vitamin D deficiency 01/21/2022   Ileus ([place]) 12/24/2021   Crohn's colitis ([employer]) 12/21/2021   Hypomagnesemia 12/21/2021   LLQ abdominal pain    Abdominal pain 10/06/2021   Incisional hernia, without obstruction or gangrene    Abnormal LFTs 07/14/2021   Abdominal wall pain 03/31/2021   Crohn's disease of both small and large intestine ([place]) 12/02/2020   Diarrhea 09/02/2020   C. difficile diarrhea 08/07/2020   Back spasm 05/20/2020   Rectal pain 11/20/2019   Hemorrhoids 11/20/2019   Crohn's disease ([place]) 04/19/2019   Acute bronchitis 11/29/2017   Primary insomnia 04/08/2017   Strain of back 04/08/2017   Diverticulitis large intestine 01/07/2015   Diverticulitis 07/03/2014   Hypokalemia 07/03/2014   Tobacco abuse  Obesity    Upper airway cough syndrome 01/28/2014   IBS (irritable bowel syndrome) 07/10/2013   Acute bilateral lower abdominal pain 03/26/2013   Tiredness 03/26/2013   Sigmoid diverticulitis 09/12/2012   Epigastric pain 05/22/2012   Nausea without vomiting 01/28/2012   GERD (gastroesophageal reflux disease) 01/10/2012   HTN (hypertension) 01/10/2012   REFERRING PROVIDER: Sanjuana Kava MD  REFERRING DIAG: Bilateral trochanteric bursitis  THERAPY DIAG:  Pain in right hip  Pain in left hip  Muscle weakness (generalized)  Rationale for Evaluation and Treatment Rehabilitation  ONSET DATE: 12/2021.  SUBJECTIVE:   SUBJECTIVE  STATEMENT: LT hip pain 2/10 still.Pain when trying to lift LT LE and at night     PATIENT GOALS Get out of pain.  Walk better and do more.   OBJECTIVE:    TODAY'S TREATMENT:     10-29-22                [place] / Combo  x 14 mins LT hip 1.5w/cm2 with Pt hooklying                Manual:  STW to LT hip musculature and ITB with Pt hooklying x 10 mins                          IFC at 80-150 Hz on 40% scan to patient's left hip x 15 minutes with HMP  Normal modality response following removal of modality.   ASSESSMENT:  CLINICAL IMPRESSION:   Pt arrived today doing fairly well with less pain walking , but reports pain at night and when trying to lift her LT Leg to get dressed and in/out of the car. Pt did well with Rx , but had notable tenderness still around LT hip.Pt to try DN next visit    GOALS:   LONG TERM GOALS: Target date: 12/10/2022   Ind with HEP. Baseline:  Goal status: INITIAL  2.  Sleep 6 hours undisturbed. Baseline:  Goal status: INITIAL  3.  Perform ADL's with pain not > 3/10. Baseline:  Goal status: INITIAL  4.  Walk with a normal gait pattern. Baseline:  Goal status: INITIAL  PLAN: PT FREQUENCY: 2x/week  PT DURATION: 6 weeks  PLANNED INTERVENTIONS: [place], E'stim, STW/M, TE, dry needling.  PLAN FOR NEXT SESSION: Patient would like to avoid exercise at this point.  We did discuss instructing her in supine hip abduction with theraband for a HEP though.  Patient would like treatments focused on left hip.  [place], STW/M.  Jonia Oakey,CHRIS, PTA 10/29/2022, 12:56 PM

## 2022-10-29 NOTE — H&P (Signed)
Elizabeth [name]; [id number]; 1961-11-08   HPI Patient is a 61yo WF who was referred to my care by Dr. Jenetta Downer for evaluation and treatment of a perianal abscess.  Has had three episodes of swelling and drainage requiring antibiotic treatment.  Is not currently draining and only complains of swelling.  Never passes air or stool in wound.  Does have Crohn's disease which is currently well controlled with medication.  Is on Xarelto. Past Medical History:  Diagnosis Date   Bronchitis    C. difficile diarrhea    Cough 01/28/2014   upper airway cough syndrome   Crohn's disease ([place])    Diverticulitis    DVT 06/2020   history of   Elevated transaminase level    GERD (gastroesophageal reflux disease)    Hypertension    Obesity    Pneumonia 12/2013   Tobacco abuse     Past Surgical History:  Procedure Laterality Date   ABDOMINAL HYSTERECTOMY  12/13/1978   APPENDECTOMY     BIOPSY  11/11/2021   Procedure: BIOPSY;  Surgeon: Rogene Houston, MD;  [facility];  Service: Endoscopy;;  bx of polyp and small bowel   COLON SURGERY     COLONOSCOPY     COLONOSCOPY N/A 08/16/2014   Procedure: COLONOSCOPY;  Surgeon: Rogene Houston, MD;  [facility];  Service: Endoscopy;  Laterality: N/A;  1200-rescheduled to 9/4 @ 10:45 Ann notified pt   COLONOSCOPY WITH PROPOFOL N/A 11/11/2021   Procedure: COLONOSCOPY WITH PROPOFOL;  Surgeon: Rogene Houston, MD;  [facility];  Service: Endoscopy;  Laterality: N/A;  2:05   ESOPHAGOGASTRODUODENOSCOPY  06/16/2012   Procedure: ESOPHAGOGASTRODUODENOSCOPY (EGD);  Surgeon: Rogene Houston, MD;  [facility];  Service: Endoscopy;  Laterality: N/A;  10:30 AM   hemocolectomy  12/14/2003   right   Illeocecal Resection  2005   INCISIONAL HERNIA REPAIR N/A 08/24/2021   Procedure: HERNIA REPAIR INCISIONAL WITH MESH;  Surgeon: Aviva Signs, MD;  [facility];  Service: General;  Laterality: N/A;   LAPAROSCOPIC SIGMOID  COLECTOMY N/A 01/07/2015   Procedure: LOW ANTERIOR COLON RESECTION;  Surgeon: Fanny Skates, MD;  Location: [place];  Service: General;  Laterality: N/A;   SHOULDER ARTHROSCOPY WITH LABRAL REPAIR Right 01/20/2016   Procedure: SHOULDER ARTHROSCOPY WITH LABRAL REPAIR;  Surgeon: Meredith Pel, MD;  Location: [place];  Service: Orthopedics;  Laterality: Right;    Family History  Problem Relation Age of Onset   Healthy Mother    Lung cancer Father        smoked   Allergic Disorder Brother    Healthy Daughter     Current Outpatient Medications on File Prior to Visit  Medication Sig Dispense Refill   albuterol (VENTOLIN HFA) 108 (90 Base) MCG/ACT inhaler Inhale 2 puffs into the lungs every 6 (six) hours as needed for wheezing or shortness of breath.     Ascorbic Acid (VITAMIN C) 1000 MG tablet Take 1,000 mg by mouth every evening.     ERGOCALCIFEROL PO Take by mouth. 2,000 units per day.     HYDROcodone-acetaminophen (NORCO) 10-325 MG tablet One-half tablet every four hours as needed for pain. 10 tablet 0   methylPREDNISolone (MEDROL) 32 MG tablet Take 1 tablet (32 mg total) by mouth daily. (Patient not taking: Reported on 10/28/2022) 3 tablet 0   omeprazole (PRILOSEC) 40 MG capsule Take 40 mg by mouth daily before breakfast.     potassium chloride (MICRO-K) 10  MEQ CR capsule Take 10 mEq by mouth 2 (two) times daily.      promethazine (PHENERGAN) 25 MG tablet TAKE 1 TABLET BY MOUTH TWICE A DAY AS NEEDED 20 tablet 0   Risankizumab-rzaa (SKYRIZI) 360 MG/2.4ML SOCT Inject 360 mg into the skin every 8 (eight) weeks. 2.4 mL 7   rivaroxaban (XARELTO) 20 MG TABS tablet Take 1 tablet (20 mg total) by mouth daily with supper. 30 tablet    valsartan-hydrochlorothiazide (DIOVAN-HCT) 160-12.5 MG per tablet Take 1 tablet by mouth in the morning.     zinc sulfate 220 (50 Zn) MG capsule Take 1 capsule by mouth every evening.     No current facility-administered medications on file prior to visit.     Allergies  Allergen Reactions   Contrast Media [Iodinated Contrast Media] Hives and Rash   Penicillins Hives and Rash             Social History   Substance and Sexual Activity  Alcohol Use Not Currently   Alcohol/week: 0.0 standard drinks of alcohol   Comment: occ    Social History   Tobacco Use  Smoking Status Every Day   Packs/day: 0.25   Years: 40.00   Total pack years: 10.00   Types: Cigarettes   Passive exposure: Current  Smokeless Tobacco Never  Tobacco Comments   increase smoking , very nervous    Review of Systems  Constitutional: Negative.   HENT: Negative.    Eyes: Negative.   Respiratory: Negative.    Cardiovascular: Negative.   Gastrointestinal:  Positive for heartburn.  Genitourinary: Negative.   Musculoskeletal: Negative.   Skin: Negative.   Neurological: Negative.   Endo/Heme/Allergies:  Bruises/bleeds easily.  Psychiatric/Behavioral: Negative.      Objective   Vitals:   10/28/22 1118  BP: 92/68  Pulse: 96  Resp: 16  Temp: 98.2 F (36.8 C)  SpO2: 95%    Physical Exam Vitals reviewed.  Constitutional:      Appearance: Normal appearance. She is not ill-appearing.  HENT:     Head: Normocephalic and atraumatic.  Cardiovascular:     Rate and Rhythm: Normal rate and regular rhythm.     Heart sounds: Normal heart sounds. No murmur heard.    No friction rub. No gallop.  Pulmonary:     Effort: Pulmonary effort is normal. No respiratory distress.     Breath sounds: Normal breath sounds. No stridor. No wheezing, rhonchi or rales.  Chest:     Chest wall: No tenderness.  Genitourinary:    Comments: Palpable tunneling from just outside the anal verge posteriorly extending to the dentate in a straight line.  No open wound noted. Skin:    General: Skin is warm and dry.  Neurological:     Mental Status: She is alert and oriented to person, place, and time.    MRI report reviewed. Assessment  Resovling perianal abscess,  ?fistula Crohn's disease, On xeralto Plan  Patient is scheduled for incision and drainage of perianal abscess, possible fistulotomy on 11/10/22.  Should it resolve, patient will call to cancel. Risks and benefits of the procedure including bleeding, infection, and recurrence were fully explained to the patient who gives informed consent.  Hold Xeralto for two days.

## 2022-11-01 ENCOUNTER — Encounter: Payer: Self-pay | Admitting: Internal Medicine

## 2022-11-02 ENCOUNTER — Encounter: Payer: Self-pay | Admitting: *Deleted

## 2022-11-02 ENCOUNTER — Ambulatory Visit: Payer: PPO | Admitting: *Deleted

## 2022-11-02 DIAGNOSIS — M25552 Pain in left hip: Secondary | ICD-10-CM

## 2022-11-02 DIAGNOSIS — M25551 Pain in right hip: Secondary | ICD-10-CM | POA: Diagnosis not present

## 2022-11-02 DIAGNOSIS — M6281 Muscle weakness (generalized): Secondary | ICD-10-CM

## 2022-11-02 DIAGNOSIS — K50818 Crohn's disease of both small and large intestine with other complication: Secondary | ICD-10-CM

## 2022-11-02 MED ORDER — PEG 3350-KCL-NA BICARB-NACL 420 G PO SOLR: 4000.0000 mL | Freq: Once | ORAL | 0 refills | Status: AC

## 2022-11-02 NOTE — Patient Instructions (Addendum)
Elizabeth [name]  11/02/2022     @PREFPERIOPPHARMACY @   Your procedure is scheduled on 11/10/2022.  Report to [facility] at 10:30 A.M.  Call this number if you have problems the morning of surgery:  [phone]  If you experience any cold or flu symptoms such as cough, fever, chills, shortness of breath, etc. between now and your scheduled surgery, please notify us at the above number.   Remember:  Do not eat or drink after midnight.     Take these medicines the morning of surgery with A SIP OF WATER : Norco and Prilosec    Use your inhaler before you come and bring your rescue inhaler with you.    Do not take any diabetic medications the morning of the procedure.     Last Xarelto dose should be 11/07/2022    Do not wear jewelry, make-up or nail polish.  Do not wear lotions, powders, or perfumes, or deodorant.  Do not shave 48 hours prior to surgery.  Men may shave face and neck.  Do not bring valuables to the hospital.  [facility] is not responsible for any belongings or valuables.  Contacts, dentures or bridgework may not be worn into surgery.  Leave your suitcase in the car.  After surgery it may be brought to your room.  For patients admitted to the hospital, discharge time will be determined by your treatment team.  Patients discharged the day of surgery will not be allowed to drive home.   Name and phone number of your driver:   Family Special instructions:  N/A  Please read over the following fact sheets that you were given. Care and Recovery After Surgery  Anorectal Abscess An abscess is an infected area that contains a collection of pus. An anorectal abscess is an abscess that is near the opening of the anus or around the rectum. Without treatment, an anorectal abscess can become larger and cause other problems, such as a more serious body-wide infection or pain, especially during bowel movements. What are the causes? This condition is caused by plugged glands  or an infection in one of these areas: The anus. The area between the anus and the scrotum in males or between the anus and the vagina in females (perineum). What increases the risk? The following factors may make you more likely to develop this condition: Diabetes or inflammatory bowel disease. Having a body defense system (immune system) that is weak. Engaging in anal sex. Having a sexually transmitted infection (STI). Certain kinds of cancer, such as rectal carcinoma, leukemia, or lymphoma. What are the signs or symptoms? The main symptom of this condition is pain. The pain may be a throbbing pain that gets worse during bowel movements. Other symptoms include: Swelling and redness in the area of the abscess. The redness may go beyond the abscess and appear as a red streak on the skin. A visible, painful lump, or a lump that can be felt when touched. Bleeding or pus-like discharge from the area. Fever. General weakness. Constipation. Diarrhea. How is this diagnosed? This condition is diagnosed based on your medical history and a physical exam of the affected area. This may involve examining the rectal area with a gloved hand (digital rectal exam). Sometimes, the health care provider needs to look into the rectum using a probe, scope, or imaging test. For women, it may require a careful vaginal exam. How is this treated? Treatment for this condition may include: Incision and drainage surgery. This  involves making an incision over the abscess to drain the pus. Medicines, including antibiotic medicine, pain medicine, stool softeners, or laxatives. Follow these instructions at home: Medicines Take over-the-counter and prescription medicines only as told by your health care provider. If you were prescribed an antibiotic medicine, use it as told by your health care provider. Do not stop using the antibiotic even if you start to feel better. Do not drive or use heavy machinery while taking  prescription pain medicine. Wound care  If gauze was used in the abscess, follow instructions from your health care provider about removing or changing the gauze. It can usually be removed in 2-3 days. Wash your hands with soap and water before you remove or change your gauze. If soap and water are not available, use hand sanitizer. If one or more drains were placed in the abscess cavity, be careful not to pull at them. Your health care provider will tell you how long they need to remain in place. Check your incision area every day for signs of infection. Check for: More redness, swelling, or pain. More fluid or blood. Warmth. Pus or a bad smell. Managing pain, stiffness, and swelling  Take a sitz bath 3-4 times a day and after bowel movements. This will help reduce pain and swelling. To relieve pain, try sitting: On a heating pad with the setting on low. On an inflatable donut-shaped cushion. If directed, put ice on the affected area: Put ice in a plastic bag. Place a towel between your skin and the bag. Leave the ice on for 20 minutes, 2-3 times a day. General instructions Follow any diet instructions given by your health care provider. Keep all follow-up visits as told by your health care provider. This is important. Contact a health care provider if you have: Bleeding from your incision. Pain, swelling, or redness that does not improve or gets worse. Trouble passing stool or urine. Symptoms that return after treatment. Get help right away if you: Have problems moving or using your legs. Have severe or increasing pain. Have swelling in the affected area that suddenly gets worse. Have a large increase in bleeding or passing of pus. Develop chills or a fever. Summary An anorectal abscess is an abscess that is near the opening of the anus or around the rectum. An abscess is an infected area that contains a collection of pus. The main symptom of this condition is pain. It may be a  throbbing pain that gets worse during bowel movements. Treatment for an anorectal abscess may include surgery to drain the pus from the abscess. Medicines and sitz baths may also be a part of your treatment plan. This information is not intended to replace advice given to you by your health care provider. Make sure you discuss any questions you have with your health care provider. Document Revised: 04/14/2022 Document Reviewed: 06/17/2021 Elsevier Patient Education  [place].  Incision and Drainage Incision and drainage is a surgical procedure to open and drain a fluid-filled sac. The sac may be filled with pus, mucus, or blood. Examples of fluid-filled sacs that may need surgical drainage include cysts, skin infections (abscesses), and red lumps that develop from a ruptured cyst or a small abscess (boils). You may need this procedure if the affected area is large, painful, infected, or not healing well. Tell a health care provider about: Any allergies you have. All medicines you are taking, including vitamins, herbs, eye drops, creams, and over-the-counter medicines. Any problems you or  family members have had with anesthetic medicines. Any blood disorders you have or have had. Any surgeries you have had. Any medical conditions you have or have had. Whether you are pregnant or may be pregnant. What are the risks? Generally, this is a safe procedure. However, problems may occur, including: Infection. Bleeding. Allergic reactions to medicines. Scarring. The cyst or abscess returns. Damage to nerves or vessels. What happens before the procedure? Medicine Ask your health care provider about: Changing or stopping your regular medicines. This is especially important if you are taking diabetes medicines or blood thinners. Taking medicines such as aspirin and ibuprofen. These medicines can thin your blood. Do not take these medicines unless your health care provider tells you to take  them. Taking over-the-counter medicines, vitamins, herbs, and supplements. Tests You may have an exam or testing. These may include: Ultrasound or other imaging tests to see how large or deep the fluid-filled sac is. Blood tests to check for infection. General instructions Follow instructions from your health care provider about eating or drinking restrictions. Plan to have someone take you home from the hospital or clinic. Ask your health care provider whether a responsible adult should care for you for at least 24 hours after you leave the hospital or clinic. This is important. You may get a tetanus shot. Ask your health care provider: How your surgery site will be marked or identified. What steps will be taken to help prevent infection. These may include: Removing hair at the surgery site. Washing skin with a germ-killing soap. Receiving antibiotic medicine. What happens during the procedure?  An IV may be inserted into one of your veins. You will be given one or more of the following: A medicine to help you relax (sedative). A medicine to numb the area (local anesthetic). A medicine to make you fall asleep (general anesthetic). An incision will be made in the top of the fluid-filled sac. Pus, blood, and mucus will be squeezed out, and a syringe or tube (drain) may be used to empty more fluid from the sac. Your health care provider will do one of the following. He or she may: Leave the drain in place for several weeks to drain more fluid. Stitch open the edges of the incision to make a long-term opening for drainage (marsupialization). The inside of the sac may be washed out (irrigated) with a sterile solution and packed with gauze before it is covered with a bandage (dressing). Your health care provider may do a culture test of the drainage fluid. The procedure may vary among health care providers and hospitals. What happens after the procedure? Your blood pressure, heart rate,  breathing rate, and blood oxygen level will be monitored often until you leave the hospital or clinic. Do not drive for 24 hours if you were given a sedative during your procedure. Summary Incision and drainage is a surgical procedure to open and drain a fluid-filled sac. The sac may be filled with pus, mucus, or blood. Before the procedure, you may be given antibiotic medicine to treat or help prevent infection. During the procedure, an incision will be made in the top of the fluid-filled sac. Pus, blood, and mucus is squeezed out, and a syringe or tube (drain) may be used to empty more fluid from the sac. The inside of the sac may be washed out (irrigated) with a sterile solution and packed with gauze before it is covered with a bandage (dressing). This information is not intended to replace advice given  to you by your health care provider. Make sure you discuss any questions you have with your health care provider. Document Revised: 03/04/2022 Document Reviewed: 09/10/2021 Elsevier Patient Education  [place] Anesthesia, Adult General anesthesia is the use of medicine to make you fall asleep (unconscious) for a medical procedure. General anesthesia must be used for certain procedures. It is often recommended for surgery or procedures that: Last a long time. Require you to be still or in an unusual position. Are major and can cause blood loss. Affect your breathing. The medicines used for general anesthesia are called general anesthetics. During general anesthesia, these medicines are given along with medicines that: Prevent pain. Control your blood pressure. Relax your muscles. Prevent nausea and vomiting after the procedure. Tell a health care provider about: Any allergies you have. All medicines you are taking, including vitamins, herbs, eye drops, creams, and over-the-counter medicines. Your history of any: Medical conditions you have, including: High blood  pressure. Bleeding problems. Diabetes. Heart or lung conditions, such as: Heart failure. Sleep apnea. Asthma. Chronic obstructive pulmonary disease (COPD). Current or recent illnesses, such as: Upper respiratory, chest, or ear infections. Cough or fever. Tobacco or drug use, including marijuana or alcohol use. Depression or anxiety. Surgeries and types of anesthetics you have had. Problems you or family members have had with anesthetic medicines. Whether you are pregnant or may be pregnant. Whether you have any chipped or loose teeth, dentures, caps, bridgework, or issues with your mouth, swallowing, or choking. What are the risks? Your health care provider will talk with you about risks. These may include: Allergic reaction to the medicines. Lung and heart problems. Inhaling food or liquid from the stomach into the lungs (aspiration). Nerve injury. Injury to the lips, mouth, teeth, or gums. Stroke. Waking up during your procedure and being unable to move. This is rare. These problems are more likely to develop if you are having a major surgery or if you have an advanced or serious medical condition. You can prevent some of these complications by answering all of your health care provider's questions thoroughly and by following all instructions before your procedure. General anesthesia can cause side effects, including: Nausea or vomiting. A sore throat or hoarseness from the breathing tube. Wheezing or coughing. Shaking chills or feeling cold. Body aches. Sleepiness. Confusion, agitation (delirium), or anxiety. What happens before the procedure? When to stop eating and drinking Follow instructions from your health care provider about what you may eat and drink before your procedure. If you do not follow your health care provider's instructions, your procedure may be delayed or canceled. Medicines Ask your health care provider about: Changing or stopping your regular  medicines. These include any diabetes medicines or blood thinners you take. Taking medicines such as aspirin and ibuprofen. These medicines can thin your blood. Do not take them unless your health care provider tells you to. Taking over-the-counter medicines, vitamins, herbs, and supplements. General instructions Do not use any products that contain nicotine or tobacco for at least 4 weeks before the procedure. These products include cigarettes, chewing tobacco, and vaping devices, such as e-cigarettes. If you need help quitting, ask your health care provider. If you brush your teeth on the morning of the procedure, make sure to spit out all of the water and toothpaste. If told by your health care provider, bring your sleep apnea device with you to surgery (if applicable). If you will be going home right after the procedure, plan to  have a responsible adult: Take you home from the hospital or clinic. You will not be allowed to drive. Care for you for the time you are told. What happens during the procedure?  An IV will be inserted into one of your veins. You will be given one or more of the following through a face mask or IV: A sedative. This helps you relax. Anesthesia. This will: Numb certain areas of your body. Make you fall asleep for surgery. After you are unconscious, a breathing tube may be inserted down your throat to help you breathe. This will be removed before you wake up. An anesthesia provider, such as an anesthesiologist, will stay with you throughout your procedure. The anesthesia provider will: Keep you comfortable and safe by continuing to give you medicines and adjusting the amount of medicine that you get. Monitor your blood pressure, heart rate, and oxygen levels to make sure that the anesthetics do not cause any problems. The procedure may vary among health care providers and hospitals. What happens after the procedure? Your blood pressure, temperature, heart rate,  breathing rate, and blood oxygen level will be monitored until you leave the hospital or clinic. You will wake up in a recovery area. You may wake up slowly. You may be given medicine to help you with pain, nausea, or any other side effects from the anesthesia. Summary General anesthesia is the use of medicine to make you fall asleep (unconscious) for a medical procedure. Follow your health care provider's instructions about when to stop eating, drinking, or taking certain medicines before your procedure. Plan to have a responsible adult take you home from the hospital or clinic. This information is not intended to replace advice given to you by your health care provider. Make sure you discuss any questions you have with your health care provider. Document Revised: 02/25/2022 Document Reviewed: 02/25/2022 Elsevier Patient Education  [place].  How to Use Chlorhexidine Before Surgery Chlorhexidine gluconate (CHG) is a germ-killing (antiseptic) solution that is used to clean the skin. It can get rid of the bacteria that normally live on the skin and can keep them away for about 24 hours. To clean your skin with CHG, you may be given: A CHG solution to use in the shower or as part of a sponge bath. A prepackaged cloth that contains CHG. Cleaning your skin with CHG may help lower the risk for infection: While you are staying in the intensive care unit of the hospital. If you have a vascular access, such as a central line, to provide short-term or long-term access to your veins. If you have a catheter to drain urine from your bladder. If you are on a ventilator. A ventilator is a machine that helps you breathe by moving air in and out of your lungs. After surgery. What are the risks? Risks of using CHG include: A skin reaction. Hearing loss, if CHG gets in your ears and you have a perforated eardrum. Eye injury, if CHG gets in your eyes and is not rinsed out. The CHG product catching  fire. Make sure that you avoid smoking and flames after applying CHG to your skin. Do not use CHG: If you have a chlorhexidine allergy or have previously reacted to chlorhexidine. On babies younger than 66 months of age. How to use CHG solution Use CHG only as told by your health care provider, and follow the instructions on the label. Use the full amount of CHG as directed. Usually, this is one  bottle. During a shower Follow these steps when using CHG solution during a shower (unless your health care provider gives you different instructions): Start the shower. Use your normal soap and shampoo to wash your face and hair. Turn off the shower or move out of the shower stream. Pour the CHG onto a clean washcloth. Do not use any type of brush or rough-edged sponge. Starting at your neck, lather your body down to your toes. Make sure you follow these instructions: If you will be having surgery, pay special attention to the part of your body where you will be having surgery. Scrub this area for at least 1 minute. Do not use CHG on your head or face. If the solution gets into your ears or eyes, rinse them well with water. Avoid your genital area. Avoid any areas of skin that have broken skin, cuts, or scrapes. Scrub your back and under your arms. Make sure to wash skin folds. Let the lather sit on your skin for 1-2 minutes or as long as told by your health care provider. Thoroughly rinse your entire body in the shower. Make sure that all body creases and crevices are rinsed well. Dry off with a clean towel. Do not put any substances on your body afterward--such as powder, lotion, or perfume--unless you are told to do so by your health care provider. Only use lotions that are recommended by the manufacturer. Put on clean clothes or pajamas. If it is the night before your surgery, sleep in clean sheets.  During a sponge bath Follow these steps when using CHG solution during a sponge bath (unless  your health care provider gives you different instructions): Use your normal soap and shampoo to wash your face and hair. Pour the CHG onto a clean washcloth. Starting at your neck, lather your body down to your toes. Make sure you follow these instructions: If you will be having surgery, pay special attention to the part of your body where you will be having surgery. Scrub this area for at least 1 minute. Do not use CHG on your head or face. If the solution gets into your ears or eyes, rinse them well with water. Avoid your genital area. Avoid any areas of skin that have broken skin, cuts, or scrapes. Scrub your back and under your arms. Make sure to wash skin folds. Let the lather sit on your skin for 1-2 minutes or as long as told by your health care provider. Using a different clean, wet washcloth, thoroughly rinse your entire body. Make sure that all body creases and crevices are rinsed well. Dry off with a clean towel. Do not put any substances on your body afterward--such as powder, lotion, or perfume--unless you are told to do so by your health care provider. Only use lotions that are recommended by the manufacturer. Put on clean clothes or pajamas. If it is the night before your surgery, sleep in clean sheets. How to use CHG prepackaged cloths Only use CHG cloths as told by your health care provider, and follow the instructions on the label. Use the CHG cloth on clean, dry skin. Do not use the CHG cloth on your head or face unless your health care provider tells you to. When washing with the CHG cloth: Avoid your genital area. Avoid any areas of skin that have broken skin, cuts, or scrapes. Before surgery Follow these steps when using a CHG cloth to clean before surgery (unless your health care provider gives you different instructions):  Using the CHG cloth, vigorously scrub the part of your body where you will be having surgery. Scrub using a back-and-forth motion for 3 minutes. The  area on your body should be completely wet with CHG when you are done scrubbing. Do not rinse. Discard the cloth and let the area air-dry. Do not put any substances on the area afterward, such as powder, lotion, or perfume. Put on clean clothes or pajamas. If it is the night before your surgery, sleep in clean sheets.  For general bathing Follow these steps when using CHG cloths for general bathing (unless your health care provider gives you different instructions). Use a separate CHG cloth for each area of your body. Make sure you wash between any folds of skin and between your fingers and toes. Wash your body in the following order, switching to a new cloth after each step: The front of your neck, shoulders, and chest. Both of your arms, under your arms, and your hands. Your stomach and groin area, avoiding the genitals. Your right leg and foot. Your left leg and foot. The back of your neck, your back, and your buttocks. Do not rinse. Discard the cloth and let the area air-dry. Do not put any substances on your body afterward--such as powder, lotion, or perfume--unless you are told to do so by your health care provider. Only use lotions that are recommended by the manufacturer. Put on clean clothes or pajamas. Contact a health care provider if: Your skin gets irritated after scrubbing. You have questions about using your solution or cloth. You swallow any chlorhexidine. Call your local poison control center (1-(351)759-2396 in the [place] Get help right away if: Your eyes itch badly, or they become very red or swollen. Your skin itches badly and is red or swollen. Your hearing changes. You have trouble seeing. You have swelling or tingling in your mouth or throat. You have trouble breathing. These symptoms may represent a serious problem that is an emergency. Do not wait to see if the symptoms will go away. Get medical help right away. Call your local emergency services (911 in the [place]  Do not drive yourself to the hospital. Summary Chlorhexidine gluconate (CHG) is a germ-killing (antiseptic) solution that is used to clean the skin. Cleaning your skin with CHG may help to lower your risk for infection. You may be given CHG to use for bathing. It may be in a bottle or in a prepackaged cloth to use on your skin. Carefully follow your health care provider's instructions and the instructions on the product label. Do not use CHG if you have a chlorhexidine allergy. Contact your health care provider if your skin gets irritated after scrubbing. This information is not intended to replace advice given to you by your health care provider. Make sure you discuss any questions you have with your health care provider. Document Revised: 03/29/2022 Document Reviewed: 02/09/2021 Elsevier Patient Education  [place].

## 2022-11-02 NOTE — Telephone Encounter (Signed)
Dr. Jenetta Downer please see clearance below. Thanks!

## 2022-11-02 NOTE — Therapy (Signed)
OUTPATIENT PHYSICAL THERAPY LOWER EXTREMITY EVALUATION   Patient Name: Elizabeth [name] MRN: [id number] DOB:[birth year], 61 y.o., female Today's Date: 11/02/2022   PT End of Session - 11/02/22 0927     Visit Number 5    Number of Visits 8    Date for PT Re-Evaluation 11/17/22    Authorization Type FOTO AT LEAST EVERY 5TH VISIT.  PROGRESS NOTE AT 10TH VISIT.  KX MODIFIER AFTER 15 VISITS.    PT Start Time 0900    PT Stop Time 0945    PT Time Calculation (min) 45 min             Past Medical History:  Diagnosis Date   Bronchitis    C. difficile diarrhea    Cough 01/28/2014   upper airway cough syndrome   Crohn's disease ([place])    Diverticulitis    DVT 06/2020   history of   Elevated transaminase level    GERD (gastroesophageal reflux disease)    Hypertension    Obesity    Pneumonia 12/2013   Tobacco abuse    Past Surgical History:  Procedure Laterality Date   ABDOMINAL HYSTERECTOMY  12/13/1978   APPENDECTOMY     BIOPSY  11/11/2021   Procedure: BIOPSY;  Surgeon: Rogene Houston, MD;  [facility];  Service: Endoscopy;;  bx of polyp and small bowel   COLON SURGERY     COLONOSCOPY     COLONOSCOPY N/A 08/16/2014   Procedure: COLONOSCOPY;  Surgeon: Rogene Houston, MD;  [facility];  Service: Endoscopy;  Laterality: N/A;  1200-rescheduled to 9/4 @ 10:45 Ann notified pt   COLONOSCOPY WITH PROPOFOL N/A 11/11/2021   Procedure: COLONOSCOPY WITH PROPOFOL;  Surgeon: Rogene Houston, MD;  [facility];  Service: Endoscopy;  Laterality: N/A;  2:05   ESOPHAGOGASTRODUODENOSCOPY  06/16/2012   Procedure: ESOPHAGOGASTRODUODENOSCOPY (EGD);  Surgeon: Rogene Houston, MD;  [facility];  Service: Endoscopy;  Laterality: N/A;  10:30 AM   hemocolectomy  12/14/2003   right   Illeocecal Resection  2005   INCISIONAL HERNIA REPAIR N/A 08/24/2021   Procedure: HERNIA REPAIR INCISIONAL WITH MESH;  Surgeon: Aviva Signs, MD;  Location[facility];   Service: General;  Laterality: N/A;   LAPAROSCOPIC SIGMOID COLECTOMY N/A 01/07/2015   Procedure: LOW ANTERIOR COLON RESECTION;  Surgeon: Fanny Skates, MD;  Location: [place];  Service: General;  Laterality: N/A;   SHOULDER ARTHROSCOPY WITH LABRAL REPAIR Right 01/20/2016   Procedure: SHOULDER ARTHROSCOPY WITH LABRAL REPAIR;  Surgeon: Meredith Pel, MD;  Location: [place];  Service: Orthopedics;  Laterality: Right;   Patient Active Problem List   Diagnosis Date Noted   Perianal Crohn's disease, with abscess ([place]) 07/29/2022   Vitamin D deficiency 01/21/2022   Ileus ([place]) 12/24/2021   Crohn's colitis ([place]) 12/21/2021   Hypomagnesemia 12/21/2021   LLQ abdominal pain    Abdominal pain 10/06/2021   Incisional hernia, without obstruction or gangrene    Abnormal LFTs 07/14/2021   Abdominal wall pain 03/31/2021   Crohn's disease of both small and large intestine ([place]) 12/02/2020   Diarrhea 09/02/2020   C. difficile diarrhea 08/07/2020   Back spasm 05/20/2020   Rectal pain 11/20/2019   Hemorrhoids 11/20/2019   Crohn's disease ([place]) 04/19/2019   Acute bronchitis 11/29/2017   Primary insomnia 04/08/2017   Strain of back 04/08/2017   Diverticulitis large intestine 01/07/2015   Diverticulitis 07/03/2014   Hypokalemia 07/03/2014   Tobacco abuse  Obesity    Upper airway cough syndrome 01/28/2014   IBS (irritable bowel syndrome) 07/10/2013   Acute bilateral lower abdominal pain 03/26/2013   Tiredness 03/26/2013   Sigmoid diverticulitis 09/12/2012   Epigastric pain 05/22/2012   Nausea without vomiting 01/28/2012   GERD (gastroesophageal reflux disease) 01/10/2012   HTN (hypertension) 01/10/2012   REFERRING PROVIDER: Sanjuana Kava MD  REFERRING DIAG: Bilateral trochanteric bursitis  THERAPY DIAG:  Pain in right hip  Pain in left hip  Muscle weakness (generalized)  Rationale for Evaluation and Treatment Rehabilitation  ONSET DATE: 12/2021.  SUBJECTIVE:   SUBJECTIVE  STATEMENT: Both hips hurting today due to weather.    PATIENT GOALS Get out of pain.  Walk better and do more.   OBJECTIVE:    TODAY'S TREATMENT:       11-02-22:  Trigger Point Dry-Needling  Treatment instructions: Expect mild to moderate muscle soreness. S/S of pneumothorax if dry needled over a lung field, and to seek immediate medical attention should they occur. Patient verbalized understanding of these instructions and education.  Patient Consent Given: Yes Education handout provided: Yes Muscles treated: Left TFL/upper glut med Treatment response/outcome: Patient tolerated DN without compliant.  Normal modality response. [place] MPT                  [place] / Combo  x 12 mins LT hip 1.5w/cm2 with Pt hooklying                Manual:  STW to LT hip musculature and ITB with Pt hooklying x 12 mins                          IFC at 80-150 Hz on 40% scan to patient's left hip x 10 minutes with HMP  Normal modality response following removal of modality.   ASSESSMENT:  CLINICAL IMPRESSION:   Pt arrived today doing fair, but both hips hurtin some due to the weather as per Pt. Pt did well with [place] combo as well as STW to LT hip. Pt also received DN from PT. IFC and HMP end of session. She peports pain at night is still the worst 7-8/10 and during the day 2-3/10. LTGs on going at this time  GOALS:   LONG TERM GOALS: Target date: 12/14/2022   Ind with HEP. Baseline:  Goal status: On going  2.  Sleep 6 hours undisturbed. Baseline:  Goal status: On going  3.  Perform ADL's with pain not > 3/10. Baseline:  Goal status: On going  4.  Walk with a normal gait pattern. Baseline:  Goal status: On going  PLAN: PT FREQUENCY: 2x/week  PT DURATION: 6 weeks  PLANNED INTERVENTIONS: [place], E'stim, STW/M, TE, dry needling.  PLAN FOR NEXT SESSION: Patient would like to avoid exercise at this point.  We did discuss instructing her in supine hip abduction with theraband for a HEP though.   Patient would like treatments focused on left hip.  [place], STW/M.  Shayne Diguglielmo,CHRIS, PTA 11/02/2022, 9:46 AM

## 2022-11-02 NOTE — Telephone Encounter (Signed)
Thanks, please update patient about Xarelto recs

## 2022-11-02 NOTE — Telephone Encounter (Signed)
Spoke with pt. Scheduled for 1/10 at 11:15am. Aware will send prep to pharmacy. Instructions will be mailed to her. Aware needs labs at least 1 week prior. Ok to hold xarelto 2 days prior

## 2022-11-03 ENCOUNTER — Encounter (HOSPITAL_COMMUNITY)
Admission: RE | Admit: 2022-11-03 | Discharge: 2022-11-03 | Disposition: A | Discharge status: Home / Self Care | Payer: PPO | Source: Ambulatory Visit | Attending: General Surgery | Admitting: General Surgery

## 2022-11-03 ENCOUNTER — Encounter (HOSPITAL_COMMUNITY): Payer: Self-pay

## 2022-11-03 VITALS — BP 89/60 | HR 84 | Temp 98.3°F | Resp 18 | Ht 67.0 in | Wt 207.0 lb

## 2022-11-03 DIAGNOSIS — R790 Abnormal level of blood mineral: Secondary | ICD-10-CM

## 2022-11-03 DIAGNOSIS — Z01810 Encounter for preprocedural cardiovascular examination: Secondary | ICD-10-CM | POA: Diagnosis not present

## 2022-11-03 HISTORY — DX: Malignant (primary) neoplasm, unspecified: C80.1

## 2022-11-03 HISTORY — DX: Unspecified osteoarthritis, unspecified site: M19.90

## 2022-11-08 ENCOUNTER — Telehealth: Payer: Self-pay | Admitting: Orthopaedic Surgery

## 2022-11-08 NOTE — Telephone Encounter (Signed)
Returned the patients call from 11/05/22, lvm for her to call us.  Her message stated that she was calling regarding her appointment for 11/09/22.

## 2022-11-09 ENCOUNTER — Ambulatory Visit: Payer: BC Managed Care – PPO | Admitting: Orthopaedic Surgery

## 2022-11-09 NOTE — Pre-Procedure Instructions (Signed)
Dr Charna Elizabeth reviewed chart and wants labs repeated. I called patient and left her a voice mail as to weather she can go today for labs or come early tomorrow to repeat labs.

## 2022-11-10 ENCOUNTER — Ambulatory Visit (HOSPITAL_BASED_OUTPATIENT_CLINIC_OR_DEPARTMENT_OTHER): Payer: PPO | Admitting: Anesthesiology

## 2022-11-10 ENCOUNTER — Encounter (HOSPITAL_COMMUNITY)
Admission: RE | Disposition: A | Discharge status: Home / Self Care | Payer: Self-pay | Source: Home / Self Care | Attending: General Surgery

## 2022-11-10 ENCOUNTER — Ambulatory Visit (HOSPITAL_COMMUNITY): Payer: PPO | Admitting: Anesthesiology

## 2022-11-10 ENCOUNTER — Ambulatory Visit (HOSPITAL_COMMUNITY)
Admission: RE | Admit: 2022-11-10 | Discharge: 2022-11-10 | Disposition: A | Discharge status: Home / Self Care | Payer: PPO | Attending: General Surgery | Admitting: General Surgery

## 2022-11-10 DIAGNOSIS — G709 Myoneural disorder, unspecified: Secondary | ICD-10-CM | POA: Diagnosis not present

## 2022-11-10 DIAGNOSIS — K648 Other hemorrhoids: Secondary | ICD-10-CM | POA: Insufficient documentation

## 2022-11-10 DIAGNOSIS — F17210 Nicotine dependence, cigarettes, uncomplicated: Secondary | ICD-10-CM

## 2022-11-10 DIAGNOSIS — J189 Pneumonia, unspecified organism: Secondary | ICD-10-CM | POA: Diagnosis not present

## 2022-11-10 DIAGNOSIS — Z79899 Other long term (current) drug therapy: Secondary | ICD-10-CM | POA: Diagnosis not present

## 2022-11-10 DIAGNOSIS — K6289 Other specified diseases of anus and rectum: Secondary | ICD-10-CM | POA: Insufficient documentation

## 2022-11-10 DIAGNOSIS — Z8719 Personal history of other diseases of the digestive system: Secondary | ICD-10-CM | POA: Diagnosis not present

## 2022-11-10 DIAGNOSIS — M1990 Unspecified osteoarthritis, unspecified site: Secondary | ICD-10-CM | POA: Diagnosis not present

## 2022-11-10 DIAGNOSIS — K219 Gastro-esophageal reflux disease without esophagitis: Secondary | ICD-10-CM | POA: Diagnosis not present

## 2022-11-10 DIAGNOSIS — K5090 Crohn's disease, unspecified, without complications: Secondary | ICD-10-CM | POA: Diagnosis not present

## 2022-11-10 DIAGNOSIS — Z7901 Long term (current) use of anticoagulants: Secondary | ICD-10-CM | POA: Insufficient documentation

## 2022-11-10 DIAGNOSIS — I10 Essential (primary) hypertension: Secondary | ICD-10-CM | POA: Diagnosis not present

## 2022-11-10 DIAGNOSIS — K50114 Crohn's disease of large intestine with abscess: Secondary | ICD-10-CM

## 2022-11-10 DIAGNOSIS — K610 Anal abscess: Secondary | ICD-10-CM | POA: Diagnosis present

## 2022-11-10 HISTORY — PX: INCISION AND DRAINAGE ABSCESS: SHX5864

## 2022-11-10 LAB — BASIC METABOLIC PANEL
Anion gap: 12 (ref 5–15)
BUN: 18 mg/dL (ref 8–23)
CO2: 22 mmol/L (ref 22–32)
Calcium: 8.4 mg/dL — ABNORMAL LOW (ref 8.9–10.3)
Chloride: 108 mmol/L (ref 98–111)
Creatinine, Ser: 0.68 mg/dL (ref 0.44–1.00)
GFR, Estimated: >60 mL/min (ref >60)
Glucose, Bld: 98 mg/dL (ref 70–99)
Potassium: 3.2 mmol/L — ABNORMAL LOW (ref 3.5–5.1)
Sodium: 142 mmol/L (ref 135–145)

## 2022-11-10 LAB — MAGNESIUM: Magnesium: 1.2 mg/dL — ABNORMAL LOW (ref 1.7–2.4)

## 2022-11-10 SURGERY — INCISION AND DRAINAGE, ABSCESS
Anesthesia: General | Site: Rectum

## 2022-11-10 MED ORDER — MIDAZOLAM HCL 5 MG/5ML IJ SOLN
INTRAMUSCULAR | Status: DC | PRN
  Administered 2022-11-10: 2 mg via INTRAVENOUS

## 2022-11-10 MED ORDER — METRONIDAZOLE 500 MG/100ML IV SOLN
INTRAVENOUS | Status: AC
  Filled 2022-11-10: qty 100

## 2022-11-10 MED ORDER — KETOROLAC TROMETHAMINE 30 MG/ML IJ SOLN: 15.0000 mg | Freq: Once | INTRAMUSCULAR | Status: DC

## 2022-11-10 MED ORDER — HYDROMORPHONE HCL 1 MG/ML IJ SOLN: 0.2500 mg | INTRAMUSCULAR | Status: DC | PRN

## 2022-11-10 MED ORDER — METOCLOPRAMIDE HCL 5 MG/ML IJ SOLN
INTRAMUSCULAR | Status: AC
  Filled 2022-11-10: qty 2

## 2022-11-10 MED ORDER — MEPERIDINE HCL 50 MG/ML IJ SOLN: 6.2500 mg | INTRAMUSCULAR | Status: DC | PRN

## 2022-11-10 MED ORDER — FENTANYL CITRATE (PF) 100 MCG/2ML IJ SOLN
INTRAMUSCULAR | Status: DC | PRN
  Administered 2022-11-10: 25 ug via INTRAVENOUS
  Administered 2022-11-10 (×2): 12.5 ug via INTRAVENOUS
  Administered 2022-11-10: 25 ug via INTRAVENOUS
  Administered 2022-11-10 (×2): 12.5 ug via INTRAVENOUS

## 2022-11-10 MED ORDER — CHLORHEXIDINE GLUCONATE 0.12 % MT SOLN: 15.0000 mL | Freq: Once | OROMUCOSAL | Status: DC

## 2022-11-10 MED ORDER — CIPROFLOXACIN IN D5W 400 MG/200ML IV SOLN
INTRAVENOUS | Status: AC
  Filled 2022-11-10: qty 200

## 2022-11-10 MED ORDER — KETOROLAC TROMETHAMINE 15 MG/ML IJ SOLN
15.0000 mg | Freq: Once | INTRAMUSCULAR | Status: AC
  Administered 2022-11-10: 15 mg via INTRAVENOUS

## 2022-11-10 MED ORDER — LIDOCAINE HCL (PF) 2 % IJ SOLN
INTRAMUSCULAR | Status: AC
  Filled 2022-11-10: qty 5

## 2022-11-10 MED ORDER — CIPROFLOXACIN IN D5W 400 MG/200ML IV SOLN
400.0000 mg | INTRAVENOUS | Status: AC
  Administered 2022-11-10: 400 mg via INTRAVENOUS

## 2022-11-10 MED ORDER — FENTANYL CITRATE (PF) 100 MCG/2ML IJ SOLN
INTRAMUSCULAR | Status: AC
  Filled 2022-11-10: qty 2

## 2022-11-10 MED ORDER — HYDROCODONE-ACETAMINOPHEN 10-325 MG PO TABS: 1.0000 | ORAL_TABLET | Freq: Four times a day (QID) | ORAL | 0 refills | Status: DC | PRN

## 2022-11-10 MED ORDER — ONDANSETRON HCL 4 MG/2ML IJ SOLN
INTRAMUSCULAR | Status: DC | PRN
  Administered 2022-11-10: 4 mg via INTRAVENOUS

## 2022-11-10 MED ORDER — LIDOCAINE HCL (CARDIAC) PF 100 MG/5ML IV SOSY
PREFILLED_SYRINGE | INTRAVENOUS | Status: DC | PRN
  Administered 2022-11-10: 100 mg via INTRAVENOUS

## 2022-11-10 MED ORDER — PROPOFOL 10 MG/ML IV BOLUS
INTRAVENOUS | Status: AC
  Filled 2022-11-10: qty 20

## 2022-11-10 MED ORDER — CHLORHEXIDINE GLUCONATE CLOTH 2 % EX PADS: 6.0000 | MEDICATED_PAD | Freq: Once | CUTANEOUS | Status: DC

## 2022-11-10 MED ORDER — METRONIDAZOLE 500 MG/100ML IV SOLN
500.0000 mg | INTRAVENOUS | Status: AC
  Administered 2022-11-10: 500 mg via INTRAVENOUS

## 2022-11-10 MED ORDER — MAGNESIUM SULFATE 2 GM/50ML IV SOLN
2.0000 g | Freq: Once | INTRAVENOUS | Status: AC
  Administered 2022-11-10: 2 g via INTRAVENOUS
  Filled 2022-11-10: qty 50

## 2022-11-10 MED ORDER — BUPIVACAINE LIPOSOME 1.3 % IJ SUSP
INTRAMUSCULAR | Status: DC | PRN
  Administered 2022-11-10: 10 mL

## 2022-11-10 MED ORDER — DEXAMETHASONE SODIUM PHOSPHATE 10 MG/ML IJ SOLN
INTRAMUSCULAR | Status: DC | PRN
  Administered 2022-11-10: 10 mg via INTRAVENOUS

## 2022-11-10 MED ORDER — ARTIFICIAL TEARS OPHTHALMIC OINT
TOPICAL_OINTMENT | OPHTHALMIC | Status: AC
  Filled 2022-11-10: qty 3.5

## 2022-11-10 MED ORDER — METOCLOPRAMIDE HCL 5 MG/ML IJ SOLN
INTRAMUSCULAR | Status: DC | PRN
  Administered 2022-11-10: 10 mg via INTRAVENOUS

## 2022-11-10 MED ORDER — BUPIVACAINE LIPOSOME 1.3 % IJ SUSP
INTRAMUSCULAR | Status: AC
  Filled 2022-11-10: qty 10

## 2022-11-10 MED ORDER — SODIUM CHLORIDE 0.9 % IR SOLN
Status: DC | PRN
  Administered 2022-11-10: 1000 mL

## 2022-11-10 MED ORDER — DEXAMETHASONE SODIUM PHOSPHATE 10 MG/ML IJ SOLN
INTRAMUSCULAR | Status: AC
  Filled 2022-11-10: qty 1

## 2022-11-10 MED ORDER — ONDANSETRON HCL 4 MG/2ML IJ SOLN
INTRAMUSCULAR | Status: AC
  Filled 2022-11-10: qty 2

## 2022-11-10 MED ORDER — ONDANSETRON HCL 4 MG/2ML IJ SOLN: 4.0000 mg | Freq: Once | INTRAMUSCULAR | Status: DC | PRN

## 2022-11-10 MED ORDER — PROPOFOL 10 MG/ML IV BOLUS
INTRAVENOUS | Status: DC | PRN
  Administered 2022-11-10: 50 mg via INTRAVENOUS
  Administered 2022-11-10: 150 mg via INTRAVENOUS

## 2022-11-10 MED ORDER — LACTATED RINGERS IV SOLN: INTRAVENOUS | Status: DC

## 2022-11-10 MED ORDER — LIDOCAINE VISCOUS HCL 2 % MT SOLN
OROMUCOSAL | Status: AC
  Filled 2022-11-10: qty 15

## 2022-11-10 MED ORDER — LABETALOL HCL 5 MG/ML IV SOLN
INTRAVENOUS | Status: AC
  Filled 2022-11-10: qty 4

## 2022-11-10 MED ORDER — MIDAZOLAM HCL 2 MG/2ML IJ SOLN
INTRAMUSCULAR | Status: AC
  Filled 2022-11-10: qty 2

## 2022-11-10 MED ORDER — ORAL CARE MOUTH RINSE: 15.0000 mL | Freq: Once | OROMUCOSAL | Status: DC

## 2022-11-10 SURGICAL SUPPLY — 20 items
BLADE SURG SZ11 CARB STEEL (BLADE) ×2 IMPLANT
CLOTH BEACON ORANGE TIMEOUT ST (SAFETY) ×2 IMPLANT
COVER LIGHT HANDLE STERIS (MISCELLANEOUS) ×4 IMPLANT
ELECT REM PT RETURN 9FT ADLT (ELECTROSURGICAL) ×1
ELECTRODE REM PT RTRN 9FT ADLT (ELECTROSURGICAL) ×2 IMPLANT
GAUZE SPONGE 4X4 12PLY STRL (GAUZE/BANDAGES/DRESSINGS) ×4 IMPLANT
GLOVE BIOGEL PI IND STRL 7.0 (GLOVE) ×4 IMPLANT
GLOVE SURG SS PI 7.5 STRL IVOR (GLOVE) ×4 IMPLANT
GOWN STRL REUS W/TWL LRG LVL3 (GOWN DISPOSABLE) ×4 IMPLANT
KIT TURNOVER KIT A (KITS) ×2 IMPLANT
MANIFOLD NEPTUNE II (INSTRUMENTS) ×2 IMPLANT
NS IRRIG 1000ML POUR BTL (IV SOLUTION) ×2 IMPLANT
PACK MINOR (CUSTOM PROCEDURE TRAY) ×2 IMPLANT
PAD ABD 5X9 TENDERSORB (GAUZE/BANDAGES/DRESSINGS) IMPLANT
PAD ARMBOARD 7.5X6 YLW CONV (MISCELLANEOUS) ×2 IMPLANT
SET BASIN LINEN APH (SET/KITS/TRAYS/PACK) ×2 IMPLANT
SHEET LAVH (DRAPES) IMPLANT
SWAB CULTURE ESWAB REG 1ML (MISCELLANEOUS) IMPLANT
SWAB CULTURE LIQ STUART DBL (MISCELLANEOUS) IMPLANT
SYR BULB IRRIG 60ML STRL (SYRINGE) ×2 IMPLANT

## 2022-11-10 NOTE — Anesthesia Procedure Notes (Signed)
Procedure Name: LMA Insertion Date/Time: 11/10/2022 11:55 AM  Performed by: Maude Leriche, CRNAPre-anesthesia Checklist: Patient identified, Emergency Drugs available, Suction available, Patient being monitored and Timeout performed Patient Re-evaluated:Patient Re-evaluated prior to induction Oxygen Delivery Method: Circle system utilized Preoxygenation: Pre-oxygenation with 100% oxygen Induction Type: IV induction LMA: LMA inserted LMA Size: 3.0 Number of attempts: 1 Placement Confirmation: positive ETCO2 Tube secured with: Tape Dental Injury: Teeth and Oropharynx as per pre-operative assessment

## 2022-11-10 NOTE — Anesthesia Preprocedure Evaluation (Addendum)
Anesthesia Evaluation  Patient identified by MRN, date of birth, ID band Patient awake    Reviewed: Allergy & Precautions, H&P , NPO status , Patient's Chart, lab work & pertinent test results  Airway Mallampati: III  TM Distance: >3 FB Neck ROM: Full    Dental  (+) Teeth Intact, Dental Advisory Given   Pulmonary pneumonia, Current Smoker   Pulmonary exam normal breath sounds clear to auscultation       Cardiovascular hypertension, Pt. on medications Normal cardiovascular exam Rhythm:Regular Rate:Normal     Neuro/Psych  Neuromuscular disease  negative psych ROS   GI/Hepatic Neg liver ROS,GERD  Medicated,,  Endo/Other  negative endocrine ROS    Renal/GU negative Renal ROS  negative genitourinary   Musculoskeletal  (+) Arthritis , Osteoarthritis,    Abdominal   Peds negative pediatric ROS (+)  Hematology negative hematology ROS (+)   Anesthesia Other Findings Hypokalemia and hypomagnesemia   Reproductive/Obstetrics negative OB ROS                             Anesthesia Physical Anesthesia Plan  ASA: 3  Anesthesia Plan: General   Post-op Pain Management: Dilaudid IV   Induction: Intravenous  PONV Risk Score and Plan: 3 and Ondansetron, Dexamethasone and Midazolam  Airway Management Planned: LMA  Additional Equipment:   Intra-op Plan:   Post-operative Plan: Extubation in OR  Informed Consent: I have reviewed the patients History and Physical, chart, labs and discussed the procedure including the risks, benefits and alternatives for the proposed anesthesia with the patient or authorized representative who has indicated his/her understanding and acceptance.     Dental advisory given  Plan Discussed with: CRNA and Surgeon  Anesthesia Plan Comments:         Anesthesia Quick Evaluation

## 2022-11-10 NOTE — Interval H&P Note (Signed)
History and Physical Interval Note:  11/10/2022 10:59 AM  Elizabeth [name]  has presented today for surgery, with the diagnosis of PERIANAL ABSCESS.  The various methods of treatment have been discussed with the patient and family. After consideration of risks, benefits and other options for treatment, the patient has consented to  Procedure(s): INCISION AND DRAINAGE ABSCESS, PERIANAL (N/A) as a surgical intervention.  The patient's history has been reviewed, patient examined, no change in status, stable for surgery.  I have reviewed the patient's chart and labs.  Questions were answered to the patient's satisfaction.     Aviva Signs

## 2022-11-10 NOTE — Op Note (Signed)
Patient:  Elizabeth [name]  DOB:  February 14, 1961  MRN:  [id number]   Preop Diagnosis: Perianal cyst, history of Crohn's disease  Postop Diagnosis: Same  Procedure: Excision of perianal cyst  Surgeon: Aviva Signs, MD  Anes: General  Indications: Patient is a 61 year old white female with a history of Crohn's disease who has had spontaneous drainage from the cystic lesion in the perianal region.  She now presents for excision.  The risks and benefits of the procedure were fully explained to the patient, who gave informed consent.  Procedure note: The patient was placed in the lithotomy position after induction of general endotracheal anesthesia.  The perineum was prepped and draped using usual sterile technique with Betadine.  Surgical site confirmation was performed.  On anoscopy, the patient did have some mild internal hemorrhoidal disease.  No bleeding was noted.  A cystic lesion was noted at the 7 o'clock position at the anal verge.  There was no fistula that could be identified.  This was excised full-thickness and a cystic structure was removed.  It was outside the external sphincter mechanism.  A bleeding was controlled using Bovie electrocautery.  The skin was reapproximated using 3-0 Chromic Gut interrupted sutures.  Exparel was instilled into the surrounding wound.  All tape and needle counts were correct at the end of the procedure.  The patient was awakened and transferred to PACU in stable condition.  Complications: None  EBL: Minimal  Specimen: None

## 2022-11-10 NOTE — Transfer of Care (Signed)
Immediate Anesthesia Transfer of Care Note  Patient: Elizabeth [name]  Procedure(s) Performed: INCISION AND DRAINAGE ABSCESS, PERIANAL  Patient Location: PACU  Anesthesia Type:General  Level of Consciousness: awake, alert , and oriented  Airway & Oxygen Therapy: Patient Spontanous Breathing and Patient connected to nasal cannula oxygen  Post-op Assessment: Report given to RN, Post -op Vital signs reviewed and stable, Patient moving all extremities X 4, and Patient able to stick tongue midline  Post vital signs: Reviewed  Last Vitals:  Vitals Value Taken Time  BP 95/63   Temp 36.6 C 11/10/22 1236  Pulse 81 11/10/22 1239  Resp 16 11/10/22 1239  SpO2 89 % 11/10/22 1239  Vitals shown include unvalidated device data.  Last Pain: There were no vitals filed for this visit.       Complications: No notable events documented.

## 2022-11-10 NOTE — Anesthesia Postprocedure Evaluation (Signed)
Anesthesia Post Note  Patient: Elizabeth [name]  Procedure(s) Performed: INCISION AND DRAINAGE ABSCESS, PERIANAL (Rectum)  Patient location during evaluation: Phase II Anesthesia Type: General Level of consciousness: awake and alert and oriented Pain management: pain level controlled Vital Signs Assessment: post-procedure vital signs reviewed and stable Respiratory status: spontaneous breathing, nonlabored ventilation and respiratory function stable Cardiovascular status: blood pressure returned to baseline and stable Postop Assessment: no apparent nausea or vomiting Anesthetic complications: no  No notable events documented.   Last Vitals:  Vitals:   11/10/22 1323 11/10/22 1329  BP: 110/71 106/62  Pulse:  (!) 59  Resp:  15  Temp:  (!) 36.4 C  SpO2:  100%    Last Pain:  Vitals:   11/10/22 1329  TempSrc: Oral  PainSc: 0-No pain                 Bladen Umar C Aveon Colquhoun

## 2022-11-10 NOTE — Discharge Instructions (Signed)
Use medicated wipes to clean your bottom

## 2022-11-11 ENCOUNTER — Encounter (INDEPENDENT_AMBULATORY_CARE_PROVIDER_SITE_OTHER): Payer: Self-pay | Admitting: Gastroenterology

## 2022-11-12 ENCOUNTER — Encounter (HOSPITAL_COMMUNITY): Payer: Self-pay | Admitting: General Surgery

## 2022-11-18 ENCOUNTER — Ambulatory Visit (INDEPENDENT_AMBULATORY_CARE_PROVIDER_SITE_OTHER): Payer: PPO | Admitting: General Surgery

## 2022-11-18 ENCOUNTER — Encounter: Payer: Self-pay | Admitting: General Surgery

## 2022-11-18 VITALS — BP 107/75 | HR 93 | Temp 98.3°F | Resp 12 | Ht 67.0 in | Wt 206.0 lb

## 2022-11-18 DIAGNOSIS — Z09 Encounter for follow-up examination after completed treatment for conditions other than malignant neoplasm: Secondary | ICD-10-CM

## 2022-11-18 NOTE — Progress Notes (Signed)
Subjective:     Elizabeth [name]  For postoperative visit, status post excision of cyst, perianal.  She states she was doing well but then started having some constipation and has intermittent rectal discomfort.  She denies any significant bleeding. Objective:    BP 107/75   Pulse 93   Temp 98.3 F (36.8 C) (Oral)   Resp 12   Ht 5' 7"  (1.702 m)   Wt 206 lb (93.4 kg)   SpO2 95%   BMI 32.26 kg/m   General:  alert, cooperative, and no distress  Rectal: Incision healing well.  A small hemorrhoid is present at the 6 o'clock position.  This is not bleeding or irritated.     Assessment:    Doing well postoperatively.    Plan:   I told her that her incision is healing well and the rectal discomfort should resolve with time.  I did give her Rectiv samples to help with the pain.  Follow-up here as needed.  Avoid constipation or straining.

## 2022-11-23 ENCOUNTER — Other Ambulatory Visit (HOSPITAL_COMMUNITY): Payer: Self-pay

## 2022-11-24 ENCOUNTER — Ambulatory Visit: Payer: PPO | Attending: Orthopaedic Surgery | Admitting: Physical Therapy

## 2022-11-24 ENCOUNTER — Encounter: Payer: Self-pay | Admitting: Physical Therapy

## 2022-11-24 DIAGNOSIS — M25551 Pain in right hip: Secondary | ICD-10-CM | POA: Diagnosis not present

## 2022-11-24 DIAGNOSIS — M6281 Muscle weakness (generalized): Secondary | ICD-10-CM | POA: Insufficient documentation

## 2022-11-24 DIAGNOSIS — M25552 Pain in left hip: Secondary | ICD-10-CM | POA: Insufficient documentation

## 2022-11-24 NOTE — Therapy (Signed)
OUTPATIENT PHYSICAL THERAPY LOWER EXTREMITY TREATMENT   Patient Name: Elizabeth [name] MRN: [id number] DOB:[birth year], 61 y.o., female Today's Date: 11/24/2022   PT End of Session - 11/24/22 1200     Visit Number 6    Number of Visits 12    Date for PT Re-Evaluation 12/15/22    Authorization Type FOTO AT LEAST EVERY 5TH VISIT.  PROGRESS NOTE AT 10TH VISIT.  KX MODIFIER AFTER 15 VISITS.    PT Start Time 0900    PT Stop Time 0958    PT Time Calculation (min) 58 min    Activity Tolerance Patient tolerated treatment well    Behavior During Therapy [facility] for tasks assessed/performed             Past Medical History:  Diagnosis Date   Arthritis    Bronchitis    C. difficile diarrhea    Cancer ([place])    Cough 01/28/2014   upper airway cough syndrome   Crohn's disease ([place])    Diverticulitis    DVT 06/2020   history of   Elevated transaminase level    GERD (gastroesophageal reflux disease)    Hypertension    Obesity    Pneumonia 12/2013   Tobacco abuse    Past Surgical History:  Procedure Laterality Date   ABDOMINAL HYSTERECTOMY  12/13/1978   APPENDECTOMY     BIOPSY  11/11/2021   Procedure: BIOPSY;  Surgeon: Rogene Houston, MD;  [facility];  Service: Endoscopy;;  bx of polyp and small bowel   COLON SURGERY     COLONOSCOPY     COLONOSCOPY N/A 08/16/2014   Procedure: COLONOSCOPY;  Surgeon: Rogene Houston, MD;  [facility];  Service: Endoscopy;  Laterality: N/A;  1200-rescheduled to 9/4 @ 10:45 Ann notified pt   COLONOSCOPY WITH PROPOFOL N/A 11/11/2021   Procedure: COLONOSCOPY WITH PROPOFOL;  Surgeon: Rogene Houston, MD;  [facility];  Service: Endoscopy;  Laterality: N/A;  2:05   ESOPHAGOGASTRODUODENOSCOPY  06/16/2012   Procedure: ESOPHAGOGASTRODUODENOSCOPY (EGD);  Surgeon: Rogene Houston, MD;  [facility];  Service: Endoscopy;  Laterality: N/A;  10:30 AM   hemocolectomy  12/14/2003   right   Illeocecal Resection  2005    INCISION AND DRAINAGE ABSCESS N/A 11/10/2022   Procedure: INCISION AND DRAINAGE ABSCESS, PERIANAL;  Surgeon: Aviva Signs, MD;  [facility];  Service: General;  Laterality: N/A;   [place] N/A 08/24/2021   Procedure: HERNIA REPAIR INCISIONAL WITH MESH;  Surgeon: Aviva Signs, MD;  [facility];  Service: General;  Laterality: N/A;   LAPAROSCOPIC SIGMOID COLECTOMY N/A 01/07/2015   Procedure: LOW ANTERIOR COLON RESECTION;  Surgeon: Fanny Skates, MD;  Location: [place];  Service: General;  Laterality: N/A;   SHOULDER ARTHROSCOPY WITH LABRAL REPAIR Right 01/20/2016   Procedure: SHOULDER ARTHROSCOPY WITH LABRAL REPAIR;  Surgeon: Meredith Pel, MD;  Location: [place];  Service: Orthopedics;  Laterality: Right;   TONGUE SURGERY Left 2018   cancer removed   Patient Active Problem List   Diagnosis Date Noted   Perianal Crohn's disease, with abscess ([place]) 07/29/2022   Vitamin D deficiency 01/21/2022   Ileus ([place]) 12/24/2021   Crohn's colitis ([place]) 12/21/2021   Hypomagnesemia 12/21/2021   LLQ abdominal pain    Abdominal pain 10/06/2021   Incisional hernia, without obstruction or gangrene    Abnormal LFTs 07/14/2021   Abdominal wall pain 03/31/2021   Crohn's disease of both small and large  intestine ([place]) 12/02/2020   Diarrhea 09/02/2020   C. difficile diarrhea 08/07/2020   Back spasm 05/20/2020   Rectal pain 11/20/2019   Hemorrhoids 11/20/2019   Crohn's disease ([place]) 04/19/2019   Acute bronchitis 11/29/2017   Primary insomnia 04/08/2017   Strain of back 04/08/2017   Diverticulitis large intestine 01/07/2015   Diverticulitis 07/03/2014   Hypokalemia 07/03/2014   Tobacco abuse    Obesity    Upper airway cough syndrome 01/28/2014   IBS (irritable bowel syndrome) 07/10/2013   Acute bilateral lower abdominal pain 03/26/2013   Tiredness 03/26/2013   Sigmoid diverticulitis 09/12/2012   Epigastric pain 05/22/2012   Nausea without vomiting 01/28/2012   GERD  (gastroesophageal reflux disease) 01/10/2012   HTN (hypertension) 01/10/2012   REFERRING PROVIDER: Sanjuana Kava MD  REFERRING DIAG: Bilateral trochanteric bursitis  THERAPY DIAG:  Pain in right hip  Pain in left hip  Rationale for Evaluation and Treatment Rehabilitation  ONSET DATE: 12/2021.  SUBJECTIVE:   SUBJECTIVE STATEMENT: Dry needling helped.  Been out of therapy due to another medical procedure. Pain, left hip 2-3/10.  PATIENT GOALS Get out of pain.  Walk better and do more.   OBJECTIVE:    TODAY'S TREATMENT:       11/24/22:  Trigger Point Dry-Needling  Treatment instructions: Expect mild to moderate muscle soreness. S/S of pneumothorax if dry needled over a lung field, and to seek immediate medical attention should they occur. Patient verbalized understanding of these instructions and education.  Patient Consent Given: Yes Education handout provided: Yes Muscles treated: Left TFL/upper glut med Treatment response/outcome: Patient tolerated DN without compliant.  Normal modality response. [place] MPT                  [place] / Combo  x 12 mins LT hip 1.5w/cm2 with Pt hook lying x 12 minutes f/b STW/M x 11 minutes to left glut med and TFL with ischemic release technique to decrease tone and pain.                                        IFC at 80-150 Hz on 40% scan to patient's left hip x 20 minutes with HMP  Normal modality response following removal of modality.   ASSESSMENT:  CLINICAL IMPRESSION:   Patient has been out of therapy due to another medical procedure.  She is still palpably tender over her left TFL and glut med.  She did very well with dry needling today.  Patient tolerated treatment without complaint with normal modality response following removal of modality.  GOALS:   LONG TERM GOALS: Target date: 01/05/2023   Ind with HEP. Baseline:  Goal status: On going  2.  Sleep 6 hours undisturbed. Baseline:  Goal status: On going  3.  Perform  ADL's with pain not > 3/10. Baseline:  Goal status: On going  4.  Walk with a normal gait pattern. Baseline:  Goal status: On going  PLAN: PT FREQUENCY: 2x/week  PT DURATION: 6 weeks  PLANNED INTERVENTIONS: [place], E'stim, STW/M, TE, dry needling.  PLAN FOR NEXT SESSION: Patient would like to avoid exercise at this point.  We did discuss instructing her in supine hip abduction with theraband for a HEP though.  Patient would like treatments focused on left hip.  [place], STW/M.  Teddy Pena, [place], PT 11/24/2022, 12:11 PM

## 2022-11-25 ENCOUNTER — Telehealth (INDEPENDENT_AMBULATORY_CARE_PROVIDER_SITE_OTHER): Payer: Self-pay | Admitting: *Deleted

## 2022-11-25 NOTE — Telephone Encounter (Signed)
Patient wants to know if she can do another prep for her colonoscopy. Reports she vomited it up last time.  She is leaving for an appointment and said to just leave a message on her voicemail if it can be changed or not.   [phone]

## 2022-11-25 NOTE — Telephone Encounter (Signed)
Called pt, LMOVM advising we can send in lower volume prep if she wants but she will need to check with her insurance/pharmacy to see what they cover and then we can send in.

## 2022-11-26 ENCOUNTER — Encounter: Payer: PPO | Admitting: Physical Therapy

## 2022-11-26 DIAGNOSIS — Z20828 Contact with and (suspected) exposure to other viral communicable diseases: Secondary | ICD-10-CM | POA: Diagnosis not present

## 2022-11-26 DIAGNOSIS — R059 Cough, unspecified: Secondary | ICD-10-CM | POA: Diagnosis not present

## 2022-11-26 DIAGNOSIS — Z6832 Body mass index (BMI) 32.0-32.9, adult: Secondary | ICD-10-CM | POA: Diagnosis not present

## 2022-11-26 DIAGNOSIS — F17210 Nicotine dependence, cigarettes, uncomplicated: Secondary | ICD-10-CM | POA: Diagnosis not present

## 2022-11-26 DIAGNOSIS — R030 Elevated blood-pressure reading, without diagnosis of hypertension: Secondary | ICD-10-CM | POA: Diagnosis not present

## 2022-12-01 ENCOUNTER — Ambulatory Visit: Payer: PPO | Admitting: Physical Therapy

## 2022-12-01 DIAGNOSIS — M25552 Pain in left hip: Secondary | ICD-10-CM

## 2022-12-01 DIAGNOSIS — M25551 Pain in right hip: Secondary | ICD-10-CM | POA: Diagnosis not present

## 2022-12-01 DIAGNOSIS — M6281 Muscle weakness (generalized): Secondary | ICD-10-CM

## 2022-12-01 NOTE — Therapy (Signed)
OUTPATIENT PHYSICAL THERAPY LOWER EXTREMITY TREATMENT   Patient Name: Elizabeth [name] MRN: [id number] DOB:05-Jul-1961, 60 y.o., female Today's Date: 12/01/2022   PT End of Session - 12/01/22 0939     Visit Number 7    Number of Visits 12    Date for PT Re-Evaluation 12/15/22    Authorization Type FOTO AT LEAST EVERY 5TH VISIT.  PROGRESS NOTE AT 10TH VISIT.  KX MODIFIER AFTER 15 VISITS.    PT Start Time 0900    PT Stop Time 0955    PT Time Calculation (min) 55 min    Activity Tolerance Patient tolerated treatment well    Behavior During Therapy [facility] for tasks assessed/performed             Past Medical History:  Diagnosis Date   Arthritis    Bronchitis    C. difficile diarrhea    Cancer ([place])    Cough 01/28/2014   upper airway cough syndrome   Crohn's disease ([place])    Diverticulitis    DVT 06/2020   history of   Elevated transaminase level    GERD (gastroesophageal reflux disease)    Hypertension    Obesity    Pneumonia 12/2013   Tobacco abuse    Past Surgical History:  Procedure Laterality Date   ABDOMINAL HYSTERECTOMY  12/13/1978   APPENDECTOMY     BIOPSY  11/11/2021   Procedure: BIOPSY;  Surgeon: Rogene Houston, MD;  [facility];  Service: Endoscopy;;  bx of polyp and small bowel   COLON SURGERY     COLONOSCOPY     COLONOSCOPY N/A 08/16/2014   Procedure: COLONOSCOPY;  Surgeon: Rogene Houston, MD;  [facility];  Service: Endoscopy;  Laterality: N/A;  1200-rescheduled to 9/4 @ 10:45 Ann notified pt   COLONOSCOPY WITH PROPOFOL N/A 11/11/2021   Procedure: COLONOSCOPY WITH PROPOFOL;  Surgeon: Rogene Houston, MD;  [facility];  Service: Endoscopy;  Laterality: N/A;  2:05   ESOPHAGOGASTRODUODENOSCOPY  06/16/2012   Procedure: ESOPHAGOGASTRODUODENOSCOPY (EGD);  Surgeon: Rogene Houston, MD;  [facility];  Service: Endoscopy;  Laterality: N/A;  10:30 AM   hemocolectomy  12/14/2003   right   Illeocecal Resection  2005    INCISION AND DRAINAGE ABSCESS N/A 11/10/2022   Procedure: INCISION AND DRAINAGE ABSCESS, PERIANAL;  Surgeon: Aviva Signs, MD;  [facility];  Service: General;  Laterality: N/A;   [place] N/A 08/24/2021   Procedure: HERNIA REPAIR INCISIONAL WITH MESH;  Surgeon: Aviva Signs, MD;  Location[facility];  Service: General;  Laterality: N/A;   LAPAROSCOPIC SIGMOID COLECTOMY N/A 01/07/2015   Procedure: LOW ANTERIOR COLON RESECTION;  Surgeon: Fanny Skates, MD;  Location: [place];  Service: General;  Laterality: N/A;   SHOULDER ARTHROSCOPY WITH LABRAL REPAIR Right 01/20/2016   Procedure: SHOULDER ARTHROSCOPY WITH LABRAL REPAIR;  Surgeon: Meredith Pel, MD;  Location: [place];  Service: Orthopedics;  Laterality: Right;   TONGUE SURGERY Left 2018   cancer removed   Patient Active Problem List   Diagnosis Date Noted   Perianal Crohn's disease, with abscess ([place]) 07/29/2022   Vitamin D deficiency 01/21/2022   Ileus ([place]) 12/24/2021   Crohn's colitis ([place]) 12/21/2021   Hypomagnesemia 12/21/2021   LLQ abdominal pain    Abdominal pain 10/06/2021   Incisional hernia, without obstruction or gangrene    Abnormal LFTs 07/14/2021   Abdominal wall pain 03/31/2021   Crohn's disease of both small and large  intestine ([place]) 12/02/2020   Diarrhea 09/02/2020   C. difficile diarrhea 08/07/2020   Back spasm 05/20/2020   Rectal pain 11/20/2019   Hemorrhoids 11/20/2019   Crohn's disease ([place]) 04/19/2019   Acute bronchitis 11/29/2017   Primary insomnia 04/08/2017   Strain of back 04/08/2017   Diverticulitis large intestine 01/07/2015   Diverticulitis 07/03/2014   Hypokalemia 07/03/2014   Tobacco abuse    Obesity    Upper airway cough syndrome 01/28/2014   IBS (irritable bowel syndrome) 07/10/2013   Acute bilateral lower abdominal pain 03/26/2013   Tiredness 03/26/2013   Sigmoid diverticulitis 09/12/2012   Epigastric pain 05/22/2012   Nausea without vomiting 01/28/2012   GERD  (gastroesophageal reflux disease) 01/10/2012   HTN (hypertension) 01/10/2012   REFERRING PROVIDER: Sanjuana Kava MD  REFERRING DIAG: Bilateral trochanteric bursitis  THERAPY DIAG:  Pain in left hip  Muscle weakness (generalized)  Rationale for Evaluation and Treatment Rehabilitation  ONSET DATE: 12/2021.  SUBJECTIVE:   SUBJECTIVE STATEMENT: Patient had a busy day yesterday helping Mother shop at [facility] and loading and unloading groceries.  Left hip pain at a 4. PATIENT GOALS Get out of pain.  Walk better and do more.   OBJECTIVE:    TODAY'S TREATMENT:                    [place] x 12 mins LT hip 1.5w/cm2 with Pt hook lying x 11 minutes f/b STW/M x 11 minutes to left glut med and TFL with ischemic release technique to decrease tone and pain.                                       IFC at 80-150 Hz on 40% scan to patient's left hip x 20 minutes with HMP  Normal modality response following removal of modality.   ASSESSMENT:  CLINICAL IMPRESSION:   Patient presented to the clinic with a flare-up of left hip pain due to grocery shopping and loading/unloading.  Her gait was also antalgic and Trendelenburg in nature.  Very tender over left TFL today.  Good response to treatment with less pain afterwards but gait still antalgic. GOALS:   LONG TERM GOALS: Target date: 01/12/2023   Ind with HEP. Baseline:  Goal status: On going  2.  Sleep 6 hours undisturbed. Baseline:  Goal status: On going  3.  Perform ADL's with pain not > 3/10. Baseline:  Goal status: On going  4.  Walk with a normal gait pattern. Baseline:  Goal status: On going  PLAN: PT FREQUENCY: 2x/week  PT DURATION: 6 weeks  PLANNED INTERVENTIONS: [place], E'stim, STW/M, TE, dry needling.  PLAN FOR NEXT SESSION: Patient would like to avoid exercise at this point.  We did discuss instructing her in supine hip abduction with theraband for a HEP though.  Patient would like treatments focused on left hip.  [place],  STW/M.  Harce Volden, [place], PT 12/01/2022, 12:21 PM

## 2022-12-02 ENCOUNTER — Ambulatory Visit (INDEPENDENT_AMBULATORY_CARE_PROVIDER_SITE_OTHER): Payer: 59 | Admitting: Gastroenterology

## 2022-12-03 ENCOUNTER — Ambulatory Visit: Payer: PPO | Admitting: Physical Therapy

## 2022-12-03 DIAGNOSIS — M25552 Pain in left hip: Secondary | ICD-10-CM

## 2022-12-03 DIAGNOSIS — M6281 Muscle weakness (generalized): Secondary | ICD-10-CM

## 2022-12-03 DIAGNOSIS — M25551 Pain in right hip: Secondary | ICD-10-CM | POA: Diagnosis not present

## 2022-12-03 NOTE — Therapy (Signed)
OUTPATIENT PHYSICAL THERAPY LOWER EXTREMITY TREATMENT   Patient Name: Elizabeth [name] MRN: [id number] DOB:09-07-1961, 61 y.o., female Today's Date: 12/03/2022   PT End of Session - 12/03/22 0943     Visit Number 8    Number of Visits 12    Date for PT Re-Evaluation 12/15/22    Authorization Type FOTO AT LEAST EVERY 5TH VISIT.  PROGRESS NOTE AT 10TH VISIT.  KX MODIFIER AFTER 15 VISITS.    PT Start Time 0900    PT Stop Time 0953    PT Time Calculation (min) 53 min             Past Medical History:  Diagnosis Date   Arthritis    Bronchitis    C. difficile diarrhea    Cancer ([facility])    Cough 01/28/2014   upper airway cough syndrome   Crohn's disease ([place])    Diverticulitis    DVT 06/2020   history of   Elevated transaminase level    GERD (gastroesophageal reflux disease)    Hypertension    Obesity    Pneumonia 12/2013   Tobacco abuse    Past Surgical History:  Procedure Laterality Date   ABDOMINAL HYSTERECTOMY  12/13/1978   APPENDECTOMY     BIOPSY  11/11/2021   Procedure: BIOPSY;  Surgeon: Rogene Houston, MD;  [facility];  Service: Endoscopy;;  bx of polyp and small bowel   COLON SURGERY     COLONOSCOPY     COLONOSCOPY N/A 08/16/2014   Procedure: COLONOSCOPY;  Surgeon: Rogene Houston, MD;  [facility];  Service: Endoscopy;  Laterality: N/A;  1200-rescheduled to 9/4 @ 10:45 Ann notified pt   COLONOSCOPY WITH PROPOFOL N/A 11/11/2021   Procedure: COLONOSCOPY WITH PROPOFOL;  Surgeon: Rogene Houston, MD;  [facility];  Service: Endoscopy;  Laterality: N/A;  2:05   ESOPHAGOGASTRODUODENOSCOPY  06/16/2012   Procedure: ESOPHAGOGASTRODUODENOSCOPY (EGD);  Surgeon: Rogene Houston, MD;  [facility];  Service: Endoscopy;  Laterality: N/A;  10:30 AM   hemocolectomy  12/14/2003   right   Illeocecal Resection  2005   INCISION AND DRAINAGE ABSCESS N/A 11/10/2022   Procedure: INCISION AND DRAINAGE ABSCESS, PERIANAL;  Surgeon:  Aviva Signs, MD;  [facility];  Service: General;  Laterality: N/A;   [place] N/A 08/24/2021   Procedure: HERNIA REPAIR INCISIONAL WITH MESH;  Surgeon: Aviva Signs, MD;  [facility];  Service: General;  Laterality: N/A;   LAPAROSCOPIC SIGMOID COLECTOMY N/A 01/07/2015   Procedure: LOW ANTERIOR COLON RESECTION;  Surgeon: Fanny Skates, MD;  Location: [place];  Service: General;  Laterality: N/A;   SHOULDER ARTHROSCOPY WITH LABRAL REPAIR Right 01/20/2016   Procedure: SHOULDER ARTHROSCOPY WITH LABRAL REPAIR;  Surgeon: Meredith Pel, MD;  Location: [place];  Service: Orthopedics;  Laterality: Right;   TONGUE SURGERY Left 2018   cancer removed   Patient Active Problem List   Diagnosis Date Noted   Perianal Crohn's disease, with abscess ([place]) 07/29/2022   Vitamin D deficiency 01/21/2022   Ileus ([place]) 12/24/2021   Crohn's colitis ([place]) 12/21/2021   Hypomagnesemia 12/21/2021   LLQ abdominal pain    Abdominal pain 10/06/2021   Incisional hernia, without obstruction or gangrene    Abnormal LFTs 07/14/2021   Abdominal wall pain 03/31/2021   Crohn's disease of both small and large intestine ([place]) 12/02/2020   Diarrhea 09/02/2020   C. difficile diarrhea 08/07/2020   Back spasm 05/20/2020  Rectal pain 11/20/2019   Hemorrhoids 11/20/2019   Crohn's disease ([place]) 04/19/2019   Acute bronchitis 11/29/2017   Primary insomnia 04/08/2017   Strain of back 04/08/2017   Diverticulitis large intestine 01/07/2015   Diverticulitis 07/03/2014   Hypokalemia 07/03/2014   Tobacco abuse    Obesity    Upper airway cough syndrome 01/28/2014   IBS (irritable bowel syndrome) 07/10/2013   Acute bilateral lower abdominal pain 03/26/2013   Tiredness 03/26/2013   Sigmoid diverticulitis 09/12/2012   Epigastric pain 05/22/2012   Nausea without vomiting 01/28/2012   GERD (gastroesophageal reflux disease) 01/10/2012   HTN (hypertension) 01/10/2012   REFERRING PROVIDER: Sanjuana Kava MD  REFERRING DIAG: Bilateral trochanteric bursitis  THERAPY DIAG:  Pain in left hip  Muscle weakness (generalized)  Rationale for Evaluation and Treatment Rehabilitation  ONSET DATE: 12/2021.  SUBJECTIVE:   SUBJECTIVE STATEMENT: No pain reported upon presentation to clinic today.  PATIENT GOALS Get out of pain.  Walk better and do more.   OBJECTIVE:    TODAY'S TREATMENT:                    [place] x 12 mins LT hip 1.5w/cm2 with Pt hook lying x 11 minutes f/b STW/M x 11 minutes to left glut med and TFL with ischemic release technique to decrease tone and pain.                                       IFC at 80-150 Hz on 40% scan to patient's left hip x 20 minutes with HMP  Normal modality response following removal of modality. HEP:  Provided patient with red theraband to perform bilateral hip abduction in hooklying position.  ASSESSMENT:  CLINICAL IMPRESSION:   Patient presented to the clinic with no complaints  of left hip pain.  Less gait antalgia today.   Still tender to palpation over left TFL today.  Good response to treatment with less pain afterwards with gait normalizing. GOALS:   LONG TERM GOALS: Target date: 01/14/2023   Ind with HEP. Baseline:  Goal status: On going  2.  Sleep 6 hours undisturbed. Baseline:  Goal status: On going  3.  Perform ADL's with pain not > 3/10. Baseline:  Goal status: On going  4.  Walk with a normal gait pattern. Baseline:  Goal status: On going  PLAN: PT FREQUENCY: 2x/week  PT DURATION: 6 weeks  PLANNED INTERVENTIONS: [place], E'stim, STW/M, TE, dry needling.  PLAN FOR NEXT SESSION: Patient would like to avoid exercise at this point.  We did discuss instructing her in supine hip abduction with theraband for a HEP though.  Patient would like treatments focused on left hip.  [place], STW/M.  Jonothan Heberle, [place], PT 12/03/2022, 10:17 AM

## 2022-12-08 NOTE — Telephone Encounter (Signed)
Left message to return call 

## 2022-12-08 NOTE — Telephone Encounter (Signed)
Left message on voicemail yesterday ( office closed for holiday) asking for a call back.  [phone]. I called patient back and had to leave a message for her to call me.

## 2022-12-08 NOTE — Telephone Encounter (Signed)
Patient called back and wanted to try the low volume prep or the pill for her colonoscopy since the other one made her vomit in the past.   [facility]

## 2022-12-09 ENCOUNTER — Encounter: Payer: Self-pay | Admitting: Physical Therapy

## 2022-12-09 ENCOUNTER — Ambulatory Visit: Payer: PPO | Admitting: Physical Therapy

## 2022-12-09 DIAGNOSIS — M25551 Pain in right hip: Secondary | ICD-10-CM | POA: Diagnosis not present

## 2022-12-09 DIAGNOSIS — M6281 Muscle weakness (generalized): Secondary | ICD-10-CM

## 2022-12-09 DIAGNOSIS — M25552 Pain in left hip: Secondary | ICD-10-CM

## 2022-12-09 NOTE — Telephone Encounter (Signed)
LMOVM to call back for PT

## 2022-12-09 NOTE — Therapy (Signed)
OUTPATIENT PHYSICAL THERAPY LOWER EXTREMITY TREATMENT   Patient Name: Elizabeth [name] MRN: [id number] DOB:August 18, 1961, 61 y.o., female Today's Date: 12/09/2022   PT End of Session - 12/09/22 0904     Visit Number 9    Number of Visits 12    Date for PT Re-Evaluation 12/15/22    Authorization Type FOTO AT LEAST EVERY 5TH VISIT.  PROGRESS NOTE AT 10TH VISIT.  KX MODIFIER AFTER 15 VISITS.    PT Start Time 0904    PT Stop Time 0946    PT Time Calculation (min) 42 min    Activity Tolerance Patient tolerated treatment well    Behavior During Therapy [facility] for tasks assessed/performed            Past Medical History:  Diagnosis Date   Arthritis    Bronchitis    C. difficile diarrhea    Cancer ([place])    Cough 01/28/2014   upper airway cough syndrome   Crohn's disease ([place])    Diverticulitis    DVT 06/2020   history of   Elevated transaminase level    GERD (gastroesophageal reflux disease)    Hypertension    Obesity    Pneumonia 12/2013   Tobacco abuse    Past Surgical History:  Procedure Laterality Date   ABDOMINAL HYSTERECTOMY  12/13/1978   APPENDECTOMY     BIOPSY  11/11/2021   Procedure: BIOPSY;  Surgeon: Rogene Houston, MD;  [facility];  Service: Endoscopy;;  bx of polyp and small bowel   COLON SURGERY     COLONOSCOPY     COLONOSCOPY N/A 08/16/2014   Procedure: COLONOSCOPY;  Surgeon: Rogene Houston, MD;  [facility];  Service: Endoscopy;  Laterality: N/A;  1200-rescheduled to 9/4 @ 10:45 Ann notified pt   COLONOSCOPY WITH PROPOFOL N/A 11/11/2021   Procedure: COLONOSCOPY WITH PROPOFOL;  Surgeon: Rogene Houston, MD;  [facility];  Service: Endoscopy;  Laterality: N/A;  2:05   ESOPHAGOGASTRODUODENOSCOPY  06/16/2012   Procedure: ESOPHAGOGASTRODUODENOSCOPY (EGD);  Surgeon: Rogene Houston, MD;  [facility];  Service: Endoscopy;  Laterality: N/A;  10:30 AM   hemocolectomy  12/14/2003   right   Illeocecal Resection  2005    INCISION AND DRAINAGE ABSCESS N/A 11/10/2022   Procedure: INCISION AND DRAINAGE ABSCESS, PERIANAL;  Surgeon: Aviva Signs, MD;  [facility];  Service: General;  Laterality: N/A;   [place] N/A 08/24/2021   Procedure: HERNIA REPAIR INCISIONAL WITH MESH;  Surgeon: Aviva Signs, MD;  [facility];  Service: General;  Laterality: N/A;   LAPAROSCOPIC SIGMOID COLECTOMY N/A 01/07/2015   Procedure: LOW ANTERIOR COLON RESECTION;  Surgeon: Fanny Skates, MD;  Location: [place];  Service: General;  Laterality: N/A;   SHOULDER ARTHROSCOPY WITH LABRAL REPAIR Right 01/20/2016   Procedure: SHOULDER ARTHROSCOPY WITH LABRAL REPAIR;  Surgeon: Meredith Pel, MD;  Location: [place];  Service: Orthopedics;  Laterality: Right;   TONGUE SURGERY Left 2018   cancer removed   Patient Active Problem List   Diagnosis Date Noted   Perianal Crohn's disease, with abscess ([place]) 07/29/2022   Vitamin D deficiency 01/21/2022   Ileus ([place]) 12/24/2021   Crohn's colitis ([place]) 12/21/2021   Hypomagnesemia 12/21/2021   LLQ abdominal pain    Abdominal pain 10/06/2021   Incisional hernia, without obstruction or gangrene    Abnormal LFTs 07/14/2021   Abdominal wall pain 03/31/2021   Crohn's disease of both small and large intestine (  [place]) 12/02/2020   Diarrhea 09/02/2020   C. difficile diarrhea 08/07/2020   Back spasm 05/20/2020   Rectal pain 11/20/2019   Hemorrhoids 11/20/2019   Crohn's disease ([place]) 04/19/2019   Acute bronchitis 11/29/2017   Primary insomnia 04/08/2017   Strain of back 04/08/2017   Diverticulitis large intestine 01/07/2015   Diverticulitis 07/03/2014   Hypokalemia 07/03/2014   Tobacco abuse    Obesity    Upper airway cough syndrome 01/28/2014   IBS (irritable bowel syndrome) 07/10/2013   Acute bilateral lower abdominal pain 03/26/2013   Tiredness 03/26/2013   Sigmoid diverticulitis 09/12/2012   Epigastric pain 05/22/2012   Nausea without vomiting 01/28/2012   GERD  (gastroesophageal reflux disease) 01/10/2012   HTN (hypertension) 01/10/2012   REFERRING PROVIDER: Sanjuana Kava MD  REFERRING DIAG: Bilateral trochanteric bursitis  THERAPY DIAG:  Pain in left hip  Muscle weakness (generalized)  Rationale for Evaluation and Treatment Rehabilitation  ONSET DATE: 12/2021.  SUBJECTIVE:   SUBJECTIVE STATEMENT: Up at 12 am taking pain meds. Has been more active and trying to walk more as well.  Pain: 5/10 L hip  PATIENT GOALS Get out of pain.  Walk better and do more.   OBJECTIVE:   TODAY'S TREATMENT:       Modalities  Date:  Unattended Estim: Hip, IFC, 15 mins, Pain Combo: Hip, 1.5 w/cm2, 100%, 1 mhz, 10 mins, Pain  Manual Therapy Myofascial Release: L hip, IASTW to reduce pain    HEP:  Provided patient with red theraband to perform bilateral hip abduction in hooklying position.  ASSESSMENT:  CLINICAL IMPRESSION:   Patient presented in clinic with being greatly limited with activity but continues even through the pain. Patient is also considering getting an injection but unsure at this time. Patient reported increased tenderness and sensitiivty around the L greater trochanter. Normal modalities response noted following removal of the modalities.  GOALS:  LONG TERM GOALS: Target date: 01/20/2023   Ind with HEP. Baseline:  Goal status: On going  2.  Sleep 6 hours undisturbed. Baseline:  Goal status: On going  3.  Perform ADL's with pain not > 3/10. Baseline:  Goal status: On going  4.  Walk with a normal gait pattern. Baseline:  Goal status: On going  PLAN: PT FREQUENCY: 2x/week  PT DURATION: 6 weeks  PLANNED INTERVENTIONS: [place], E'stim, STW/M, TE, dry needling.  PLAN FOR NEXT SESSION: Patient would like to avoid exercise at this point.  We did discuss instructing her in supine hip abduction with theraband for a HEP though.  Patient would like treatments focused on left hip.  [place], STW/M.  Elizebeth Koller Nayana Lenig,  PTA 12/09/2022, 10:14 AM

## 2022-12-10 NOTE — Telephone Encounter (Signed)
LMOVM to return call

## 2022-12-14 ENCOUNTER — Encounter: Payer: Self-pay | Admitting: *Deleted

## 2022-12-14 NOTE — Telephone Encounter (Signed)
Pt called and states that she can't do the prep because it will make her vomit. She wanted to know if there was a different prep that she could try that may not make her sick. Advised pt can try clenpiq, will give samples and new instructions to pt. She will stop by tomorrow to pick up samples

## 2022-12-15 ENCOUNTER — Other Ambulatory Visit (HOSPITAL_COMMUNITY)
Admission: RE | Admit: 2022-12-15 | Discharge: 2022-12-15 | Disposition: A | Discharge status: Home / Self Care | Payer: PPO | Source: Ambulatory Visit | Attending: Gastroenterology | Admitting: Gastroenterology

## 2022-12-15 ENCOUNTER — Encounter: Payer: Self-pay | Admitting: Physical Therapy

## 2022-12-15 ENCOUNTER — Ambulatory Visit: Payer: PPO | Attending: Orthopaedic Surgery | Admitting: Physical Therapy

## 2022-12-15 DIAGNOSIS — K50818 Crohn's disease of both small and large intestine with other complication: Secondary | ICD-10-CM | POA: Diagnosis not present

## 2022-12-15 DIAGNOSIS — M25552 Pain in left hip: Secondary | ICD-10-CM | POA: Diagnosis not present

## 2022-12-15 DIAGNOSIS — M25551 Pain in right hip: Secondary | ICD-10-CM

## 2022-12-15 DIAGNOSIS — M6281 Muscle weakness (generalized): Secondary | ICD-10-CM

## 2022-12-15 LAB — BASIC METABOLIC PANEL
Anion gap: 8 (ref 5–15)
BUN: 16 mg/dL (ref 8–23)
CO2: 26 mmol/L (ref 22–32)
Calcium: 8.6 mg/dL — ABNORMAL LOW (ref 8.9–10.3)
Chloride: 103 mmol/L (ref 98–111)
Creatinine, Ser: 0.71 mg/dL (ref 0.44–1.00)
GFR, Estimated: >60 mL/min (ref >60)
Glucose, Bld: 93 mg/dL (ref 70–99)
Potassium: 3.2 mmol/L — ABNORMAL LOW (ref 3.5–5.1)
Sodium: 137 mmol/L (ref 135–145)

## 2022-12-15 NOTE — Therapy (Addendum)
OUTPATIENT PHYSICAL THERAPY LOWER EXTREMITY TREATMENT   Patient Name: Elizabeth [name] MRN: [id number] DOB:[birth year], 62 y.o., female Today's Date: 12/15/2022   PT End of Session - 12/15/22 1032     Visit Number 10    Number of Visits 12    Date for PT Re-Evaluation 12/15/22    Authorization Type FOTO AT LEAST EVERY 5TH VISIT.  PROGRESS NOTE AT 10TH VISIT.  KX MODIFIER AFTER 15 VISITS.    PT Start Time 1032    PT Stop Time 1112    PT Time Calculation (min) 40 min    Activity Tolerance Patient tolerated treatment well    Behavior During Therapy WFL for tasks assessed/performed            Past Medical History:  Diagnosis Date   Arthritis    Bronchitis    C. difficile diarrhea    Cancer ([place])    Cough 01/28/2014   upper airway cough syndrome   Crohn's disease ([place])    Diverticulitis    DVT 06/2020   history of   Elevated transaminase level    GERD (gastroesophageal reflux disease)    Hypertension    Obesity    Pneumonia 12/2013   Tobacco abuse    Past Surgical History:  Procedure Laterality Date   ABDOMINAL HYSTERECTOMY  12/13/1978   APPENDECTOMY     BIOPSY  11/11/2021   Procedure: BIOPSY;  Surgeon: Rogene Houston, MD;  [facility];  Service: Endoscopy;;  bx of polyp and small bowel   COLON SURGERY     COLONOSCOPY     COLONOSCOPY N/A 08/16/2014   Procedure: COLONOSCOPY;  Surgeon: Rogene Houston, MD;  [facility];  Service: Endoscopy;  Laterality: N/A;  1200-rescheduled to 9/4 @ 10:45 Ann notified pt   COLONOSCOPY WITH PROPOFOL N/A 11/11/2021   Procedure: COLONOSCOPY WITH PROPOFOL;  Surgeon: Rogene Houston, MD;  [facility];  Service: Endoscopy;  Laterality: N/A;  2:05   ESOPHAGOGASTRODUODENOSCOPY  06/16/2012   Procedure: ESOPHAGOGASTRODUODENOSCOPY (EGD);  Surgeon: Rogene Houston, MD;  [facility];  Service: Endoscopy;  Laterality: N/A;  10:30 AM   hemocolectomy  12/14/2003   right   Illeocecal Resection  2005    INCISION AND DRAINAGE ABSCESS N/A 11/10/2022   Procedure: INCISION AND DRAINAGE ABSCESS, PERIANAL;  Surgeon: Aviva Signs, MD;  [facility]: [facility];  Service: General;  Laterality: N/A;   [place] N/A 08/24/2021   Procedure: HERNIA REPAIR INCISIONAL WITH MESH;  Surgeon: Aviva Signs, MD;  Location: [facility];  Service: General;  Laterality: N/A;   LAPAROSCOPIC SIGMOID COLECTOMY N/A 01/07/2015   Procedure: LOW ANTERIOR COLON RESECTION;  Surgeon: Fanny Skates, MD;  Location: [place];  Service: General;  Laterality: N/A;   SHOULDER ARTHROSCOPY WITH LABRAL REPAIR Right 01/20/2016   Procedure: SHOULDER ARTHROSCOPY WITH LABRAL REPAIR;  Surgeon: Meredith Pel, MD;  Location: [place];  Service: Orthopedics;  Laterality: Right;   TONGUE SURGERY Left 2018   cancer removed   Patient Active Problem List   Diagnosis Date Noted   Perianal Crohn's disease, with abscess ([place]) 07/29/2022   Vitamin D deficiency 01/21/2022   Ileus ([place]) 12/24/2021   Crohn's colitis ([place]) 12/21/2021   Hypomagnesemia 12/21/2021   LLQ abdominal pain    Abdominal pain 10/06/2021   Incisional hernia, without obstruction or gangrene    Abnormal LFTs 07/14/2021   Abdominal wall pain 03/31/2021   Crohn's disease of both small and large intestine (  [place]) 12/02/2020   Diarrhea 09/02/2020   C. difficile diarrhea 08/07/2020   Back spasm 05/20/2020   Rectal pain 11/20/2019   Hemorrhoids 11/20/2019   Crohn's disease ([place]) 04/19/2019   Acute bronchitis 11/29/2017   Primary insomnia 04/08/2017   Strain of back 04/08/2017   Diverticulitis large intestine 01/07/2015   Diverticulitis 07/03/2014   Hypokalemia 07/03/2014   Tobacco abuse    Obesity    Upper airway cough syndrome 01/28/2014   IBS (irritable bowel syndrome) 07/10/2013   Acute bilateral lower abdominal pain 03/26/2013   Tiredness 03/26/2013   Sigmoid diverticulitis 09/12/2012   Epigastric pain 05/22/2012   Nausea without vomiting 01/28/2012   GERD  (gastroesophageal reflux disease) 01/10/2012   HTN (hypertension) 01/10/2012   REFERRING PROVIDER: Sanjuana Kava MD  REFERRING DIAG: Bilateral trochanteric bursitis  THERAPY DIAG:  Pain in left hip  Muscle weakness (generalized)  Pain in right hip  Rationale for Evaluation and Treatment Rehabilitation  ONSET DATE: 12/2021.  SUBJECTIVE:   SUBJECTIVE STATEMENT: Has been very sore over B greater trochanters recently. Worse at night and cannot get comfortable to sleep.  Pain: 1/10 B hip  PATIENT GOALS Get out of pain.  Walk better and do more.   OBJECTIVE:   TODAY'S TREATMENT:       Modalities  Date:  Unattended Estim: Hip, IFC, 15 mins, Pain Combo: Hip, 1.5 w/cm2, 100%, 1 mhz, 10 mins, Pain  Manual Therapy Myofascial Release: L hip, IASTW to reduce pain    HEP:  Provided patient with red theraband to perform bilateral hip abduction in hooklying position.  ASSESSMENT:  CLINICAL IMPRESSION:   Patient presented in clinic with reports of B hip soreness but minimal rating. Patient very tender with IASTW to L greater trochanter and unable to tolerate long session. Patient denied any treatment to R hip as L hip is somewhat worse. Normal modalities response noted following removal of the modalities. Limited progression of goals due to pain.  GOALS:  LONG TERM GOALS: Target date: 01/26/2023   Ind with HEP. Baseline:  Goal status: On going  2.  Sleep 6 hours undisturbed. Baseline:  Goal status: On going  3.  Perform ADL's with pain not > 3/10. Baseline:  Goal status: On going  4.  Walk with a normal gait pattern. Baseline:  Goal status: On going  PLAN: PT FREQUENCY: 2x/week  PT DURATION: 6 weeks  PLANNED INTERVENTIONS: [place], E'stim, STW/M, TE, dry needling.  PLAN FOR NEXT SESSION: Patient would like to avoid exercise at this point.  We did discuss instructing her in supine hip abduction with theraband for a HEP though.  Patient would like treatments focused  on left hip.  [place], STW/M.  Standley Brooking, PTA 12/15/2022, 11:22 AM   Progress Note Reporting Period 10/20/22 to 12/15/2022  See note below for Objective Data and Assessment of Progress/Goals. Hip pain has been consistently low with an overall good response to treatments.    Mali Applegate MPT

## 2022-12-20 ENCOUNTER — Ambulatory Visit: Payer: PPO | Admitting: Physical Therapy

## 2022-12-20 DIAGNOSIS — M25552 Pain in left hip: Secondary | ICD-10-CM

## 2022-12-20 DIAGNOSIS — M25551 Pain in right hip: Secondary | ICD-10-CM

## 2022-12-20 DIAGNOSIS — M6281 Muscle weakness (generalized): Secondary | ICD-10-CM

## 2022-12-20 NOTE — Therapy (Signed)
OUTPATIENT PHYSICAL THERAPY LOWER EXTREMITY TREATMENT   Patient Name: Elizabeth [name] MRN: [id number] DOB:1960-12-26, 62 y.o., female Today's Date: 12/20/2022   PT End of Session - 12/20/22 1206     Visit Number 11    Number of Visits 12    Date for PT Re-Evaluation 12/15/22    Authorization Type FOTO AT LEAST EVERY 5TH VISIT.  PROGRESS NOTE AT 10TH VISIT.  KX MODIFIER AFTER 15 VISITS.    PT Start Time 1024    PT Stop Time 1103    PT Time Calculation (min) 39 min    Activity Tolerance Patient tolerated treatment well    Behavior During Therapy WFL for tasks assessed/performed             Past Medical History:  Diagnosis Date   Arthritis    Bronchitis    C. difficile diarrhea    Cancer ([place])    Cough 01/28/2014   upper airway cough syndrome   Crohn's disease ([place])    Diverticulitis    DVT 06/2020   history of   Elevated transaminase level    GERD (gastroesophageal reflux disease)    Hypertension    Obesity    Pneumonia 12/2013   Tobacco abuse    Past Surgical History:  Procedure Laterality Date   ABDOMINAL HYSTERECTOMY  12/13/1978   APPENDECTOMY     BIOPSY  11/11/2021   Procedure: BIOPSY;  Surgeon: Rogene Houston, MD;  [facility];  Service: Endoscopy;;  bx of polyp and small bowel   COLON SURGERY     COLONOSCOPY     COLONOSCOPY N/A 08/16/2014   Procedure: COLONOSCOPY;  Surgeon: Rogene Houston, MD;  [facility];  Service: Endoscopy;  Laterality: N/A;  1200-rescheduled to 9/4 @ 10:45 Ann notified pt   COLONOSCOPY WITH PROPOFOL N/A 11/11/2021   Procedure: COLONOSCOPY WITH PROPOFOL;  Surgeon: Rogene Houston, MD;  [facility];  Service: Endoscopy;  Laterality: N/A;  2:05   ESOPHAGOGASTRODUODENOSCOPY  06/16/2012   Procedure: ESOPHAGOGASTRODUODENOSCOPY (EGD);  Surgeon: Rogene Houston, MD;  [facility];  Service: Endoscopy;  Laterality: N/A;  10:30 AM   hemocolectomy  12/14/2003   right   Illeocecal Resection  2005    INCISION AND DRAINAGE ABSCESS N/A 11/10/2022   Procedure: INCISION AND DRAINAGE ABSCESS, PERIANAL;  Surgeon: Aviva Signs, MD;  [facility];  Service: General;  Laterality: N/A;   [place] N/A 08/24/2021   Procedure: HERNIA REPAIR INCISIONAL WITH MESH;  Surgeon: Aviva Signs, MD;  [facility];  Service: General;  Laterality: N/A;   LAPAROSCOPIC SIGMOID COLECTOMY N/A 01/07/2015   Procedure: LOW ANTERIOR COLON RESECTION;  Surgeon: Fanny Skates, MD;  Location: [place];  Service: General;  Laterality: N/A;   SHOULDER ARTHROSCOPY WITH LABRAL REPAIR Right 01/20/2016   Procedure: SHOULDER ARTHROSCOPY WITH LABRAL REPAIR;  Surgeon: Meredith Pel, MD;  Location: [place];  Service: Orthopedics;  Laterality: Right;   TONGUE SURGERY Left 2018   cancer removed   Patient Active Problem List   Diagnosis Date Noted   Perianal Crohn's disease, with abscess ([place]) 07/29/2022   Vitamin D deficiency 01/21/2022   Ileus ([place]) 12/24/2021   Crohn's colitis ([place]) 12/21/2021   Hypomagnesemia 12/21/2021   LLQ abdominal pain    Abdominal pain 10/06/2021   Incisional hernia, without obstruction or gangrene    Abnormal LFTs 07/14/2021   Abdominal wall pain 03/31/2021   Crohn's disease of both small and large  intestine ([place]) 12/02/2020   Diarrhea 09/02/2020   C. difficile diarrhea 08/07/2020   Back spasm 05/20/2020   Rectal pain 11/20/2019   Hemorrhoids 11/20/2019   Crohn's disease ([place]) 04/19/2019   Acute bronchitis 11/29/2017   Primary insomnia 04/08/2017   Strain of back 04/08/2017   Diverticulitis large intestine 01/07/2015   Diverticulitis 07/03/2014   Hypokalemia 07/03/2014   Tobacco abuse    Obesity    Upper airway cough syndrome 01/28/2014   IBS (irritable bowel syndrome) 07/10/2013   Acute bilateral lower abdominal pain 03/26/2013   Tiredness 03/26/2013   Sigmoid diverticulitis 09/12/2012   Epigastric pain 05/22/2012   Nausea without vomiting 01/28/2012   GERD  (gastroesophageal reflux disease) 01/10/2012   HTN (hypertension) 01/10/2012   REFERRING PROVIDER: Sanjuana Kava MD  REFERRING DIAG: Bilateral trochanteric bursitis  THERAPY DIAG:  Pain in left hip  Muscle weakness (generalized)  Pain in right hip  Rationale for Evaluation and Treatment Rehabilitation  ONSET DATE: 12/2021.  SUBJECTIVE:   SUBJECTIVE STATEMENT: No pain reported upon presentation to clinic today.  Patient states feeling 80% better overall.  PATIENT GOALS Get out of pain.  Walk better and do more.   OBJECTIVE:    TODAY'S TREATMENT:     Trigger Point Dry-Needling  Muscles treated: Bilateral TFL's. STW/M x 11 minutes to patient's left hip, STW/M x 12 minutes to patient's right hip while she was on IFC to  hip x 20 minutes.                                                    ASSESSMENT:  CLINICAL IMPRESSION:   Patient reporting no pain upon presentation to the clinic today though she still has palpable pain over both TFL's.  Very good response to dry needling to bilateral TFL's.  She feels she is 80% better overall. GOALS:   LONG TERM GOALS: Target date: 01/31/2023   Ind with HEP. Baseline:  Goal status: On going  2.  Sleep 6 hours undisturbed. Baseline:  Goal status: On going  3.  Perform ADL's with pain not > 3/10. Baseline:  Goal status: On going  4.  Walk with a normal gait pattern. Baseline:  Goal status: On going  PLAN: PT FREQUENCY: 2x/week  PT DURATION: 6 weeks  PLANNED INTERVENTIONS: [place], E'stim, STW/M, TE, dry needling.  PLAN FOR NEXT SESSION: Patient would like to avoid exercise at this point.  We did discuss instructing her in supine hip abduction with theraband for a HEP though.  Patient would like treatments focused on left hip.  [place], STW/M.  Keirstan Iannello, [place], PT 12/20/2022, 12:11 PM

## 2022-12-21 DIAGNOSIS — E7849 Other hyperlipidemia: Secondary | ICD-10-CM | POA: Diagnosis not present

## 2022-12-21 DIAGNOSIS — I10 Essential (primary) hypertension: Secondary | ICD-10-CM | POA: Diagnosis not present

## 2022-12-21 DIAGNOSIS — K2100 Gastro-esophageal reflux disease with esophagitis, without bleeding: Secondary | ICD-10-CM | POA: Diagnosis not present

## 2022-12-22 ENCOUNTER — Ambulatory Visit (HOSPITAL_BASED_OUTPATIENT_CLINIC_OR_DEPARTMENT_OTHER): Payer: PPO | Admitting: Certified Registered Nurse Anesthetist

## 2022-12-22 ENCOUNTER — Ambulatory Visit (HOSPITAL_COMMUNITY): Payer: PPO | Admitting: Certified Registered Nurse Anesthetist

## 2022-12-22 ENCOUNTER — Encounter (HOSPITAL_COMMUNITY)
Admission: RE | Disposition: A | Discharge status: Home / Self Care | Payer: Self-pay | Source: Home / Self Care | Attending: Gastroenterology

## 2022-12-22 ENCOUNTER — Other Ambulatory Visit: Payer: Self-pay

## 2022-12-22 ENCOUNTER — Encounter (HOSPITAL_COMMUNITY): Payer: Self-pay | Admitting: Gastroenterology

## 2022-12-22 ENCOUNTER — Ambulatory Visit (HOSPITAL_COMMUNITY)
Admission: RE | Admit: 2022-12-22 | Discharge: 2022-12-22 | Disposition: A | Discharge status: Home / Self Care | Payer: PPO | Attending: Gastroenterology | Admitting: Gastroenterology

## 2022-12-22 DIAGNOSIS — D127 Benign neoplasm of rectosigmoid junction: Secondary | ICD-10-CM | POA: Insufficient documentation

## 2022-12-22 DIAGNOSIS — K621 Rectal polyp: Secondary | ICD-10-CM | POA: Diagnosis not present

## 2022-12-22 DIAGNOSIS — F17210 Nicotine dependence, cigarettes, uncomplicated: Secondary | ICD-10-CM | POA: Diagnosis not present

## 2022-12-22 DIAGNOSIS — K50812 Crohn's disease of both small and large intestine with intestinal obstruction: Secondary | ICD-10-CM | POA: Insufficient documentation

## 2022-12-22 DIAGNOSIS — K529 Noninfective gastroenteritis and colitis, unspecified: Secondary | ICD-10-CM

## 2022-12-22 DIAGNOSIS — Z09 Encounter for follow-up examination after completed treatment for conditions other than malignant neoplasm: Secondary | ICD-10-CM | POA: Diagnosis not present

## 2022-12-22 DIAGNOSIS — Z980 Intestinal bypass and anastomosis status: Secondary | ICD-10-CM | POA: Diagnosis not present

## 2022-12-22 DIAGNOSIS — K649 Unspecified hemorrhoids: Secondary | ICD-10-CM | POA: Insufficient documentation

## 2022-12-22 DIAGNOSIS — Z7901 Long term (current) use of anticoagulants: Secondary | ICD-10-CM | POA: Diagnosis not present

## 2022-12-22 DIAGNOSIS — Z86010 Personal history of colonic polyps: Secondary | ICD-10-CM

## 2022-12-22 DIAGNOSIS — K219 Gastro-esophageal reflux disease without esophagitis: Secondary | ICD-10-CM | POA: Insufficient documentation

## 2022-12-22 DIAGNOSIS — D124 Benign neoplasm of descending colon: Secondary | ICD-10-CM

## 2022-12-22 DIAGNOSIS — I10 Essential (primary) hypertension: Secondary | ICD-10-CM | POA: Diagnosis not present

## 2022-12-22 DIAGNOSIS — G709 Myoneural disorder, unspecified: Secondary | ICD-10-CM | POA: Diagnosis not present

## 2022-12-22 DIAGNOSIS — K635 Polyp of colon: Secondary | ICD-10-CM | POA: Diagnosis not present

## 2022-12-22 DIAGNOSIS — D128 Benign neoplasm of rectum: Secondary | ICD-10-CM | POA: Diagnosis not present

## 2022-12-22 DIAGNOSIS — Z79899 Other long term (current) drug therapy: Secondary | ICD-10-CM | POA: Diagnosis not present

## 2022-12-22 DIAGNOSIS — Z9049 Acquired absence of other specified parts of digestive tract: Secondary | ICD-10-CM | POA: Diagnosis not present

## 2022-12-22 DIAGNOSIS — Z86718 Personal history of other venous thrombosis and embolism: Secondary | ICD-10-CM | POA: Insufficient documentation

## 2022-12-22 DIAGNOSIS — Z6832 Body mass index (BMI) 32.0-32.9, adult: Secondary | ICD-10-CM | POA: Insufficient documentation

## 2022-12-22 DIAGNOSIS — K50818 Crohn's disease of both small and large intestine with other complication: Secondary | ICD-10-CM

## 2022-12-22 DIAGNOSIS — Z91041 Radiographic dye allergy status: Secondary | ICD-10-CM | POA: Insufficient documentation

## 2022-12-22 DIAGNOSIS — F17200 Nicotine dependence, unspecified, uncomplicated: Secondary | ICD-10-CM | POA: Diagnosis not present

## 2022-12-22 HISTORY — PX: SUBMUCOSAL TATTOO INJECTION: SHX6856

## 2022-12-22 HISTORY — PX: SUBMUCOSAL LIFTING INJECTION: SHX6855

## 2022-12-22 HISTORY — PX: HEMOSTASIS CLIP PLACEMENT: SHX6857

## 2022-12-22 HISTORY — PX: POLYPECTOMY: SHX5525

## 2022-12-22 HISTORY — PX: COLONOSCOPY WITH PROPOFOL: SHX5780

## 2022-12-22 SURGERY — COLONOSCOPY WITH PROPOFOL
Anesthesia: General

## 2022-12-22 MED ORDER — PROPOFOL 500 MG/50ML IV EMUL
INTRAVENOUS | Status: AC
  Filled 2022-12-22: qty 50

## 2022-12-22 MED ORDER — SPOT INK MARKER SYRINGE KIT
PACK | SUBMUCOSAL | Status: DC | PRN
  Administered 2022-12-22: 1 mL via SUBMUCOSAL

## 2022-12-22 MED ORDER — METHYLENE BLUE 1 % INJ SOLN
INTRAVENOUS | Status: DC | PRN
  Administered 2022-12-22: 140 mL

## 2022-12-22 MED ORDER — PROPOFOL 10 MG/ML IV BOLUS
INTRAVENOUS | Status: DC | PRN
  Administered 2022-12-22: 60 mg via INTRAVENOUS

## 2022-12-22 MED ORDER — LACTATED RINGERS IV SOLN: INTRAVENOUS | Status: DC | PRN

## 2022-12-22 MED ORDER — SPOT INK MARKER SYRINGE KIT
PACK | SUBMUCOSAL | Status: AC
  Filled 2022-12-22: qty 5

## 2022-12-22 MED ORDER — PROPOFOL 500 MG/50ML IV EMUL
INTRAVENOUS | Status: DC | PRN
  Administered 2022-12-22: 150 ug/kg/min via INTRAVENOUS

## 2022-12-22 MED ORDER — ALBUTEROL SULFATE HFA 108 (90 BASE) MCG/ACT IN AERS
INHALATION_SPRAY | RESPIRATORY_TRACT | Status: AC
  Filled 2022-12-22: qty 6.7

## 2022-12-22 MED ORDER — LACTATED RINGERS IV SOLN: INTRAVENOUS | Status: DC

## 2022-12-22 MED ORDER — ALBUTEROL SULFATE HFA 108 (90 BASE) MCG/ACT IN AERS
INHALATION_SPRAY | RESPIRATORY_TRACT | Status: DC | PRN
  Administered 2022-12-22: 6 via RESPIRATORY_TRACT

## 2022-12-22 MED ORDER — PROPOFOL 10 MG/ML IV BOLUS
INTRAVENOUS | Status: AC
  Filled 2022-12-22: qty 20

## 2022-12-22 NOTE — Discharge Instructions (Addendum)
You are being discharged to home.  Resume your previous diet.  We are waiting for your pathology results.  Your physician has recommended a repeat colonoscopy in six months for surveillance after today's piecemeal polypectomy.  Continue with Skirizi Smoking cessation.  Restart Xarelto in 2 days.

## 2022-12-22 NOTE — Anesthesia Procedure Notes (Signed)
Procedure Name: General with mask airway Date/Time: 12/22/2022 11:15 AM  Performed by: Maude Leriche, CRNAOxygen Delivery Method: Simple face mask Preoxygenation: Pre-oxygenation with 100% oxygen

## 2022-12-22 NOTE — H&P (Signed)
Elizabeth [name] is an 62 y.o. female.   Chief Complaint: Crohn's disease and history of colon polyps. HPI: 62 year old female with past medical history of  ileocolonic c Crohn's disease status post ileocecectomy and partial colectomy, DVT on Xarelto, recurrent C. Diff, hypertension, GERD, obesity who comes for evaluation of Crohn's disease and history of colon polyps.  Patient reports feeling well and denies any new symptoms.  In fact she states she is having regular formed bowel movements without abdominal pain, nausea or vomiting.  No fever or chills. On [place]  Last colonoscopy was in November 2022 revealing active disease involving neoterminal ileum with noncritical stricture.  Biopsy from inflamed mucosa at descending colon revealed hyperplastic changes and raised the possibility of sessile serrated polyp.   Past Medical History:  Diagnosis Date   Arthritis    Bronchitis    C. difficile diarrhea    Cancer ([place])    Cough 01/28/2014   upper airway cough syndrome   Crohn's disease ([place])    Diverticulitis    DVT 06/2020   history of   Elevated transaminase level    GERD (gastroesophageal reflux disease)    Hypertension    Obesity    Pneumonia 12/2013   Tobacco abuse     Past Surgical History:  Procedure Laterality Date   ABDOMINAL HYSTERECTOMY  12/13/1978   APPENDECTOMY     BIOPSY  11/11/2021   Procedure: BIOPSY;  Surgeon: Rogene Houston, MD;  [facility];  Service: Endoscopy;;  bx of polyp and small bowel   COLON SURGERY     COLONOSCOPY     COLONOSCOPY N/A 08/16/2014   Procedure: COLONOSCOPY;  Surgeon: Rogene Houston, MD;  [facility];  Service: Endoscopy;  Laterality: N/A;  1200-rescheduled to 9/4 @ 10:45 Ann notified pt   COLONOSCOPY WITH PROPOFOL N/A 11/11/2021   Procedure: COLONOSCOPY WITH PROPOFOL;  Surgeon: Rogene Houston, MD;  [facility];  Service: Endoscopy;  Laterality: N/A;  2:05   ESOPHAGOGASTRODUODENOSCOPY  06/16/2012    Procedure: ESOPHAGOGASTRODUODENOSCOPY (EGD);  Surgeon: Rogene Houston, MD;  [facility];  Service: Endoscopy;  Laterality: N/A;  10:30 AM   hemocolectomy  12/14/2003   right   Illeocecal Resection  2005   INCISION AND DRAINAGE ABSCESS N/A 11/10/2022   Procedure: INCISION AND DRAINAGE ABSCESS, PERIANAL;  Surgeon: Aviva Signs, MD;  [facility];  Service: General;  Laterality: N/A;   [place] N/A 08/24/2021   Procedure: HERNIA REPAIR INCISIONAL WITH MESH;  Surgeon: Aviva Signs, MD;  [facility];  Service: General;  Laterality: N/A;   LAPAROSCOPIC SIGMOID COLECTOMY N/A 01/07/2015   Procedure: LOW ANTERIOR COLON RESECTION;  Surgeon: Fanny Skates, MD;  Location: [place];  Service: General;  Laterality: N/A;   SHOULDER ARTHROSCOPY WITH LABRAL REPAIR Right 01/20/2016   Procedure: SHOULDER ARTHROSCOPY WITH LABRAL REPAIR;  Surgeon: Meredith Pel, MD;  Location: [place];  Service: Orthopedics;  Laterality: Right;   TONGUE SURGERY Left 2018   cancer removed    Family History  Problem Relation Age of Onset   Healthy Mother    Lung cancer Father        smoked   Allergic Disorder Brother    Healthy Daughter    Social History:  reports that she has been smoking cigarettes. She has a 10.00 pack-year smoking history. She has been exposed to tobacco smoke. She has never used smokeless tobacco. She reports that she does not currently  use alcohol. She reports that she does not use drugs.  Allergies:  Allergies  Allergen Reactions   Contrast Media [Iodinated Contrast Media] Hives and Rash   Penicillins Hives and Rash             Medications Prior to Admission  Medication Sig Dispense Refill   Ascorbic Acid (VITAMIN C) 1000 MG tablet Take 1,000 mg by mouth every evening.     Cholecalciferol (VITAMIN D3) 50 MCG (2000 UT) TABS Take 2,000 Units by mouth every evening.     HYDROcodone-acetaminophen (NORCO) 10-325 MG tablet Take 1 tablet by mouth every 6  (six) hours as needed. 25 tablet 0   omeprazole (PRILOSEC) 40 MG capsule Take 40 mg by mouth daily before breakfast.     potassium chloride (MICRO-K) 10 MEQ CR capsule Take 10 mEq by mouth 2 (two) times daily.      Risankizumab-rzaa (SKYRIZI) 360 MG/2.4ML SOCT Inject 360 mg into the skin every 8 (eight) weeks. 2.4 mL 7   rivaroxaban (XARELTO) 20 MG TABS tablet Take 1 tablet (20 mg total) by mouth daily with supper. 30 tablet    valsartan-hydrochlorothiazide (DIOVAN-HCT) 160-12.5 MG per tablet Take 1 tablet by mouth in the morning.     zinc gluconate 50 MG tablet Take 50 mg by mouth daily.     albuterol (VENTOLIN HFA) 108 (90 Base) MCG/ACT inhaler Inhale 2 puffs into the lungs every 6 (six) hours as needed for wheezing or shortness of breath.     promethazine (PHENERGAN) 25 MG tablet TAKE 1 TABLET BY MOUTH TWICE A DAY AS NEEDED 20 tablet 0    No results found for this or any previous visit (from the past 48 hour(s)). No results found.  Review of Systems  All other systems reviewed and are negative.   Blood pressure 114/69, pulse 78, temperature 97.7 F (36.5 C), temperature source Oral, resp. rate 17, height '5\' 7"'$  (1.702 m), weight 93.9 kg, SpO2 96 %. Physical Exam  GENERAL: The patient is AO x3, in no acute distress. HEENT: Head is normocephalic and atraumatic. EOMI are intact. Mouth is well hydrated and without lesions. NECK: Supple. No masses LUNGS: Clear to auscultation. No presence of rhonchi/wheezing/rales. Adequate chest expansion HEART: RRR, normal s1 and s2. ABDOMEN: Soft, nontender, no guarding, no peritoneal signs, and nondistended. BS +. No masses. EXTREMITIES: Without any cyanosis, clubbing, rash, lesions or edema. NEUROLOGIC: AOx3, no focal motor deficit. SKIN: no jaundice, no rashes  Assessment/Plan 62 year old female with past medical history of  ileocolonic c Crohn's disease status post ileocecectomy and partial colectomy, DVT on Xarelto, recurrent C. Diff,  hypertension, GERD, obesity who comes for evaluation of Crohn's disease and history of colon polyps.  Will proceed with colonoscopy with possible chromoendoscopy with methylene blue.  Harvel Quale, MD 12/22/2022, 10:53 AM

## 2022-12-22 NOTE — Anesthesia Postprocedure Evaluation (Signed)
Anesthesia Post Note  Patient: Elizabeth [name]  Procedure(s) Performed: COLONOSCOPY WITH PROPOFOL POLYPECTOMY SUBMUCOSAL LIFTING INJECTION HEMOSTASIS CLIP PLACEMENT SUBMUCOSAL TATTOO INJECTION  Patient location during evaluation: PACU Anesthesia Type: General Level of consciousness: awake and alert Pain management: pain level controlled Vital Signs Assessment: post-procedure vital signs reviewed and stable Respiratory status: spontaneous breathing, nonlabored ventilation, respiratory function stable and patient connected to nasal cannula oxygen Cardiovascular status: blood pressure returned to baseline and stable Postop Assessment: no apparent nausea or vomiting Anesthetic complications: no   No notable events documented.   Last Vitals:  Vitals:   12/22/22 1009  BP: 114/69  Pulse: 78  Resp: 17  Temp: 36.5 C  SpO2: 96%    Last Pain:  Vitals:   12/22/22 1110  TempSrc:   PainSc: 0-No pain                 Louann Sjogren

## 2022-12-22 NOTE — Transfer of Care (Signed)
Immediate Anesthesia Transfer of Care Note  Patient: Elizabeth [name]  Procedure(s) Performed: COLONOSCOPY WITH PROPOFOL POLYPECTOMY SUBMUCOSAL LIFTING INJECTION HEMOSTASIS CLIP PLACEMENT SUBMUCOSAL TATTOO INJECTION  Patient Location: PACU  Anesthesia Type:General  Level of Consciousness: awake and alert   Airway & Oxygen Therapy: Patient Spontanous Breathing  Post-op Assessment: Report given to RN and Post -op Vital signs reviewed and stable  Post vital signs: Reviewed and stable  Last Vitals:  Vitals Value Taken Time  BP 105/72   Temp 98   Pulse 62   Resp 16   SpO2 98     Last Pain:  Vitals:   12/22/22 1110  TempSrc:   PainSc: 0-No pain      Patients Stated Pain Goal: 9 (69/50/72 2575)  Complications: No notable events documented.

## 2022-12-22 NOTE — Anesthesia Preprocedure Evaluation (Signed)
Anesthesia Evaluation  Patient identified by MRN, date of birth, ID band Patient awake    Reviewed: Allergy & Precautions, H&P , NPO status , Patient's Chart, lab work & pertinent test results, reviewed documented beta blocker date and time   Airway Mallampati: II  TM Distance: >3 FB Neck ROM: full    Dental no notable dental hx.    Pulmonary neg pulmonary ROS, Current Smoker   Pulmonary exam normal breath sounds clear to auscultation       Cardiovascular Exercise Tolerance: Good hypertension, negative cardio ROS  Rhythm:regular Rate:Normal     Neuro/Psych  Neuromuscular disease  negative psych ROS   GI/Hepatic Neg liver ROS,GERD  Medicated,,  Endo/Other  negative endocrine ROS    Renal/GU negative Renal ROS  negative genitourinary   Musculoskeletal   Abdominal   Peds  Hematology negative hematology ROS (+)   Anesthesia Other Findings   Reproductive/Obstetrics negative OB ROS                             Anesthesia Physical Anesthesia Plan  ASA: 2  Anesthesia Plan: General   Post-op Pain Management:    Induction:   PONV Risk Score and Plan: Propofol infusion  Airway Management Planned:   Additional Equipment:   Intra-op Plan:   Post-operative Plan:   Informed Consent: I have reviewed the patients History and Physical, chart, labs and discussed the procedure including the risks, benefits and alternatives for the proposed anesthesia with the patient or authorized representative who has indicated his/her understanding and acceptance.     Dental Advisory Given  Plan Discussed with: CRNA  Anesthesia Plan Comments:        Anesthesia Quick Evaluation

## 2022-12-22 NOTE — Op Note (Signed)
[facility] Patient Name: Elizabeth [name] Procedure Date: 12/22/2022 10:46 AM MRN: [id number] Date of Birth: 11-11-1961 Attending MD: [name] , , [id number] CSN: [id number] Age: 62 Admit Type: Outpatient Procedure:                Colonoscopy Indications:              For therapy of colon polyps, Follow-up of Crohn's                            disease of the small bowel and colon Providers:                [name], Janeece Riggers, RN, Everardo Pacific Referring MD:              Medicines:                Monitored Anesthesia Care Complications:            No immediate complications. Estimated Blood Loss:     Estimated blood loss: none. Procedure:                Pre-Anesthesia Assessment:                           - Prior to the procedure, a History and Physical                            was performed, and patient medications, allergies                            and sensitivities were reviewed. The patient's                            tolerance of previous anesthesia was reviewed.                           - The risks and benefits of the procedure and the                            sedation options and risks were discussed with the                            patient. All questions were answered and informed                            consent was obtained.                           - ASA Grade Assessment: III - A patient with severe                            systemic disease.                           After obtaining informed consent, the colonoscope                            was  passed under direct vision. Throughout the                            procedure, the patient's blood pressure, pulse, and                            oxygen saturations were monitored continuously. The                            PCF-HQ190L ([id number]) scope was introduced through                            the anus and advanced to the the cecum, identified                            by appendiceal  orifice and ileocecal valve. The                            colonoscopy was performed without difficulty. The                            patient tolerated the procedure well. The quality                            of the bowel preparation was good. Scope In: 11:08:37 AM Scope Out: 12:20:26 PM Scope Withdrawal Time: 0 hours 59 minutes 47 seconds  Total Procedure Duration: 1 hour 11 minutes 49 seconds  Findings:      Hemorrhoids were found on perianal exam.      The neo-terminal ileum appeared normal, but scope could not be advanced       past the stricture site.      The ileal surgical anastomosis contained a benign-appearing, intrinsic       stenosis measuring 1 cm (in length) x 8 mm (inner diameter) that was       non-traversed.      Inflammation characterized by congestion (edema) was found on ly in the       sigmoid, the rest of the colon did not show any inflammation. When       compared to previous examinations, the findings are improved. Contrast       chromoscopy with methylene blue was performed using spray       chromoendoscopy technique.      A 20 mm polyp was found in the descending colon. The polyp was flat.       Area was successfully injected with 1 mL Eleview for a lift polypectomy.       Imaging was performed using white light and narrow band imaging to       visualize the mucosa and demarcate the polyp site after injection for       EMR purposes. Chromoendoscopy also facilitated this. The polyp was       removed with a hot snare in three pieces as piecemeal polypectomy.       Resection and retrieval were complete. To prevent bleeding after the       polypectomy, three hemostatic clips were successfully placed (one of       them was a Ultra clip), but one  of the clips fell off. Clip       manufacturer: [employer]. There was no bleeding at the end of the       procedure. Area 1 cm distal to polypectomy site was tattooed with an       injection of 1 mL of Spot  (carbon black).      There was evidence of a prior end-to-side colo-colonic anastomosis in       the recto-sigmoid colon. This was patent and was characterized by       healthy appearing mucosa. The anastomosis was traversed.      Three sessile polyps were found in the rectum. The polyps were 3 to 6 mm       in size. These polyps were removed with a cold snare. Resection and       retrieval were complete.      A 1 mm polyp was found in the rectum. The polyp was sessile. The polyp       was removed with a cold biopsy forceps. Resection and retrieval were       complete.      The retroflexed view of the distal rectum and anal verge was normal and       showed no anal or rectal abnormalities. Impression:               - Hemorrhoids found on perianal exam.                           - The examined portion of the ileum was normal.                           - Non critical stenosis at the ileal surgical                            anastomosis.                           - Inflammation was found., improved compared to                            previous examinations. Chromoscopy performed.                           - One 20 mm polyp in the descending colon, removed                            with a hot snare. Resected and retrieved via EMR.                            Injected. Clips were placed. Clip manufacturer:                            [employer]. area distal was tattooed.                           - Patent end-to-side colo-colonic anastomosis,  characterized by healthy appearing mucosa.                           - Three 3 to 6 mm polyps in the rectum, removed                            with a cold snare. Resected and retrieved.                           - One 1 mm polyp in the rectum, removed with a cold                            biopsy forceps. Resected and retrieved.                           - The distal rectum and anal verge are normal on                             retroflexion view. Moderate Sedation:      Per Anesthesia Care Recommendation:           - Discharge patient to home (ambulatory).                           - Resume previous diet.                           - Await pathology results.                           - Repeat colonoscopy in 6 months for surveillance                            after piecemeal polypectomy.                           - Continue with Skirizi                           - Smoking cessation.                           - Restart Xarelto in 2 days. Procedure Code(s):        --- Professional ---                           [phone], 59, Colonoscopy, flexible; with removal of                            tumor(s), polyp(s), or other lesion(s) by snare                            technique                           45380, 59, Colonoscopy, flexible; with biopsy,  single or multiple                           45381, Colonoscopy, flexible; with directed                            submucosal injection(s), any substance Diagnosis Code(s):        --- Professional ---                           K64.9, Unspecified hemorrhoids                           K50.812, Crohn's disease of both small and large                            intestine with intestinal obstruction                           K52.9, Noninfective gastroenteritis and colitis,                            unspecified                           D12.4, Benign neoplasm of descending colon                           D12.8, Benign neoplasm of rectum                           Z98.0, Intestinal bypass and anastomosis status                           K63.5, Polyp of colon CPT copyright 2022 American [employer] Association. All rights reserved. The codes documented in this report are preliminary and upon coder review may  be revised to meet current compliance requirements. [name], MD [name],  12/22/2022 12:42:33 PM This report has been  signed electronically. Number of Addenda: 0

## 2022-12-23 ENCOUNTER — Encounter: Payer: PPO | Admitting: *Deleted

## 2022-12-23 LAB — SURGICAL PATHOLOGY

## 2022-12-27 ENCOUNTER — Encounter (HOSPITAL_COMMUNITY): Payer: Self-pay | Admitting: Gastroenterology

## 2022-12-27 ENCOUNTER — Encounter (INDEPENDENT_AMBULATORY_CARE_PROVIDER_SITE_OTHER): Payer: Self-pay | Admitting: *Deleted

## 2023-01-06 ENCOUNTER — Encounter: Payer: Self-pay | Admitting: *Deleted

## 2023-01-06 ENCOUNTER — Ambulatory Visit: Payer: PPO | Admitting: *Deleted

## 2023-01-06 DIAGNOSIS — M25552 Pain in left hip: Secondary | ICD-10-CM | POA: Diagnosis not present

## 2023-01-06 DIAGNOSIS — M6281 Muscle weakness (generalized): Secondary | ICD-10-CM

## 2023-01-06 DIAGNOSIS — M25551 Pain in right hip: Secondary | ICD-10-CM

## 2023-01-06 NOTE — Therapy (Addendum)
OUTPATIENT PHYSICAL THERAPY LOWER EXTREMITY TREATMENT   Patient Name: Elizabeth [name] MRN: [id number] DOB:1961/08/18, 62 y.o., female Today's Date: 01/06/2023   PT End of Session - 01/06/23 0904     Visit Number 12    Number of Visits 18    Date for PT Re-Evaluation 01/26/23    PT Start Time 0900    PT Stop Time 0946    PT Time Calculation (min) 46 min             Past Medical History:  Diagnosis Date   Arthritis    Bronchitis    C. difficile diarrhea    Cancer ([place])    Cough 01/28/2014   upper airway cough syndrome   Crohn's disease ([place])    Diverticulitis    DVT 06/2020   history of   Elevated transaminase level    GERD (gastroesophageal reflux disease)    Hypertension    Obesity    Pneumonia 12/2013   Tobacco abuse    Past Surgical History:  Procedure Laterality Date   ABDOMINAL HYSTERECTOMY  12/13/1978   APPENDECTOMY     BIOPSY  11/11/2021   Procedure: BIOPSY;  Surgeon: Rogene Houston, MD;  [facility];  Service: Endoscopy;;  bx of polyp and small bowel   COLON SURGERY     COLONOSCOPY     COLONOSCOPY N/A 08/16/2014   Procedure: COLONOSCOPY;  Surgeon: Rogene Houston, MD;  [facility];  Service: Endoscopy;  Laterality: N/A;  1200-rescheduled to 9/4 @ 10:45 Ann notified pt   COLONOSCOPY WITH PROPOFOL N/A 11/11/2021   Procedure: COLONOSCOPY WITH PROPOFOL;  Surgeon: Rogene Houston, MD;  [facility];  Service: Endoscopy;  Laterality: N/A;  2:05   COLONOSCOPY WITH PROPOFOL N/A 12/22/2022   Procedure: COLONOSCOPY WITH PROPOFOL;  Surgeon: Harvel Quale, MD;  [facility];  Service: Gastroenterology;  Laterality: N/A;  11:15am, asa 1-2   ESOPHAGOGASTRODUODENOSCOPY  06/16/2012   Procedure: ESOPHAGOGASTRODUODENOSCOPY (EGD);  Surgeon: Rogene Houston, MD;  [facility];  Service: Endoscopy;  Laterality: N/A;  10:30 AM   hemocolectomy  12/14/2003   right   HEMOSTASIS CLIP PLACEMENT  12/22/2022    Procedure: HEMOSTASIS CLIP PLACEMENT;  Surgeon: Harvel Quale, MD;  [facility];  Service: Gastroenterology;;   Illeocecal Resection  2005   INCISION AND DRAINAGE ABSCESS N/A 11/10/2022   Procedure: INCISION AND DRAINAGE ABSCESS, PERIANAL;  Surgeon: Aviva Signs, MD;  [facility];  Service: General;  Laterality: N/A;   [place] N/A 08/24/2021   Procedure: HERNIA REPAIR INCISIONAL WITH MESH;  Surgeon: Aviva Signs, MD;  [facility];  Service: General;  Laterality: N/A;   LAPAROSCOPIC SIGMOID COLECTOMY N/A 01/07/2015   Procedure: LOW ANTERIOR COLON RESECTION;  Surgeon: Fanny Skates, MD;  Location: [place];  Service: General;  Laterality: N/A;   POLYPECTOMY  12/22/2022   Procedure: POLYPECTOMY;  Surgeon: Harvel Quale, MD;  [facility];  Service: Gastroenterology;;   SHOULDER ARTHROSCOPY WITH LABRAL REPAIR Right 01/20/2016   Procedure: SHOULDER ARTHROSCOPY WITH LABRAL REPAIR;  Surgeon: Meredith Pel, MD;  Location: [place];  Service: Orthopedics;  Laterality: Right;   SUBMUCOSAL LIFTING INJECTION  12/22/2022   Procedure: SUBMUCOSAL LIFTING INJECTION;  Surgeon: Harvel Quale, MD;  [facility];  Service: Gastroenterology;;   SUBMUCOSAL TATTOO INJECTION  12/22/2022   Procedure: SUBMUCOSAL TATTOO INJECTION;  Surgeon: Harvel Quale, MD;  [facility];  Service: Gastroenterology;;   TONGUE SURGERY Left 2018   cancer removed   Patient Active Problem List   Diagnosis Date Noted   History of colon polyps 12/22/2022   Perianal Crohn's disease, with abscess ([place]) 07/29/2022   Vitamin D deficiency 01/21/2022   Ileus ([place]) 12/24/2021   Crohn's colitis ([place]) 12/21/2021   Hypomagnesemia 12/21/2021   LLQ abdominal pain    Abdominal pain 10/06/2021   Incisional hernia, without obstruction or gangrene    Abnormal LFTs 07/14/2021   Abdominal wall pain 03/31/2021   Crohn's disease of  both small and large intestine ([place]) 12/02/2020   Diarrhea 09/02/2020   C. difficile diarrhea 08/07/2020   Back spasm 05/20/2020   Rectal pain 11/20/2019   Hemorrhoids 11/20/2019   Crohn's disease ([place]) 04/19/2019   Acute bronchitis 11/29/2017   Primary insomnia 04/08/2017   Strain of back 04/08/2017   Diverticulitis large intestine 01/07/2015   Diverticulitis 07/03/2014   Hypokalemia 07/03/2014   Tobacco abuse    Obesity    Upper airway cough syndrome 01/28/2014   IBS (irritable bowel syndrome) 07/10/2013   Acute bilateral lower abdominal pain 03/26/2013   Tiredness 03/26/2013   Sigmoid diverticulitis 09/12/2012   Epigastric pain 05/22/2012   Nausea without vomiting 01/28/2012   GERD (gastroesophageal reflux disease) 01/10/2012   HTN (hypertension) 01/10/2012   REFERRING PROVIDER: Sanjuana Kava MD  REFERRING DIAG: Bilateral trochanteric bursitis  THERAPY DIAG:  Pain in left hip  Muscle weakness (generalized)  Pain in right hip  Rationale for Evaluation and Treatment Rehabilitation  ONSET DATE: 12/2021.  SUBJECTIVE:   SUBJECTIVE STATEMENT:    I can't raise my legs to the side. I am so weak. Would like to start Exercises.     PATIENT GOALS Get out of pain.  Walk better and do more.   OBJECTIVE:    TODAY'S TREATMENT:                                         01-06-23 Manual:   Supine  Hip abduction manual resistance isometrics Bil. At different degrees of abduction and AAROM through full ROM STW/M x 8 minutes to patient's left hip, and ITB STW/M x 8 minutes to patient's right hip and ITB                                     EXERCISE LOG  Exercise Repetitions and Resistance Comments  Seated green tband 2x10 hold 5 secs   Seated glute sets X 10    Sit to stand focus on glutes x10   Bridge  x10   Side stepping X5 each way        Blank cell = exercise not performed today       Green Tband and handout given for HEP                                                  ASSESSMENT:  CLINICAL IMPRESSION:   Patient reporting  pain and weakness and both hips and that she is aggravated that she can't raise her legs to the side. Pt requested more exercises today to help with strengthening both LE's.  Rx focused on HEP for Hip Abductors as well as glute activation and strengthening.. Pt using UE's and quads when going from sit to stand and with very little glute activation. Green tband and handout given for HEP. Manual techniques focused on STW as well as isometric mm activation and AAROM for Abduction. Recert for more visits.       GOALS:   LONG TERM GOALS: Target date: 02/17/2023   Ind with HEP. Baseline:  Goal status: On going  2.  Sleep 6 hours undisturbed. Baseline:  Goal status: On going  3.  Perform ADL's with pain not > 3/10. Baseline:  Goal status: On going  4.  Walk with a normal gait pattern. Baseline:  Goal status: On going  PLAN: PT FREQUENCY: 2x/week  PT DURATION: 6 weeks  PLANNED INTERVENTIONS: [place], E'stim, STW/M, TE, dry needling.  PLAN FOR NEXT SESSION:  Review HEP  We did discuss instructing her in supine hip abduction with theraband for a HEP though.  Patient would like treatments focused on left hip.  [place], STW/M.  [place], [place], PT 01/06/2023, 10:32 AM

## 2023-01-06 NOTE — Addendum Note (Signed)
Addended by: Kristalynn Coddington, [place] W on: 01/06/2023 10:35 AM   Modules accepted: Orders

## 2023-01-10 ENCOUNTER — Ambulatory Visit: Payer: PPO

## 2023-01-10 DIAGNOSIS — M6281 Muscle weakness (generalized): Secondary | ICD-10-CM

## 2023-01-10 DIAGNOSIS — M25552 Pain in left hip: Secondary | ICD-10-CM

## 2023-01-10 DIAGNOSIS — M25551 Pain in right hip: Secondary | ICD-10-CM

## 2023-01-10 NOTE — Therapy (Signed)
OUTPATIENT PHYSICAL THERAPY LOWER EXTREMITY TREATMENT   Patient Name: Elizabeth [name] MRN: [id number] DOB:[birth year], 62 y.o., female Today's Date: 01/10/2023   PT End of Session - 01/10/23 0948     Visit Number 13    Number of Visits 18    Date for PT Re-Evaluation 01/26/23    Authorization Type FOTO AT LEAST EVERY 5TH VISIT.  PROGRESS NOTE AT 10TH VISIT.  KX MODIFIER AFTER 15 VISITS.    PT Start Time 0945    PT Stop Time 1030    PT Time Calculation (min) 45 min    Activity Tolerance Patient tolerated treatment well    Behavior During Therapy WFL for tasks assessed/performed             Past Medical History:  Diagnosis Date   Arthritis    Bronchitis    C. difficile diarrhea    Cancer ([place])    Cough 01/28/2014   upper airway cough syndrome   Crohn's disease ([place])    Diverticulitis    DVT 06/2020   history of   Elevated transaminase level    GERD (gastroesophageal reflux disease)    Hypertension    Obesity    Pneumonia 12/2013   Tobacco abuse    Past Surgical History:  Procedure Laterality Date   ABDOMINAL HYSTERECTOMY  12/13/1978   APPENDECTOMY     BIOPSY  11/11/2021   Procedure: BIOPSY;  Surgeon: Rogene Houston, MD;  [facility];  Service: Endoscopy;;  bx of polyp and small bowel   COLON SURGERY     COLONOSCOPY     COLONOSCOPY N/A 08/16/2014   Procedure: COLONOSCOPY;  Surgeon: Rogene Houston, MD;  [facility];  Service: Endoscopy;  Laterality: N/A;  1200-rescheduled to 9/4 @ 10:45 Ann notified pt   COLONOSCOPY WITH PROPOFOL N/A 11/11/2021   Procedure: COLONOSCOPY WITH PROPOFOL;  Surgeon: Rogene Houston, MD;  [facility];  Service: Endoscopy;  Laterality: N/A;  2:05   COLONOSCOPY WITH PROPOFOL N/A 12/22/2022   Procedure: COLONOSCOPY WITH PROPOFOL;  Surgeon: Harvel Quale, MD;  [facility];  Service: Gastroenterology;  Laterality: N/A;  11:15am, asa 1-2   ESOPHAGOGASTRODUODENOSCOPY  06/16/2012    Procedure: ESOPHAGOGASTRODUODENOSCOPY (EGD);  Surgeon: Rogene Houston, MD;  [facility];  Service: Endoscopy;  Laterality: N/A;  10:30 AM   hemocolectomy  12/14/2003   right   HEMOSTASIS CLIP PLACEMENT  12/22/2022   Procedure: HEMOSTASIS CLIP PLACEMENT;  Surgeon: Harvel Quale, MD;  [facility];  Service: Gastroenterology;;   Illeocecal Resection  2005   INCISION AND DRAINAGE ABSCESS N/A 11/10/2022   Procedure: INCISION AND DRAINAGE ABSCESS, PERIANAL;  Surgeon: Aviva Signs, MD;  [facility];  Service: General;  Laterality: N/A;   [place] N/A 08/24/2021   Procedure: HERNIA REPAIR INCISIONAL WITH MESH;  Surgeon: Aviva Signs, MD;  [facility];  Service: General;  Laterality: N/A;   LAPAROSCOPIC SIGMOID COLECTOMY N/A 01/07/2015   Procedure: LOW ANTERIOR COLON RESECTION;  Surgeon: Fanny Skates, MD;  Location: [place];  Service: General;  Laterality: N/A;   POLYPECTOMY  12/22/2022   Procedure: POLYPECTOMY;  Surgeon: Harvel Quale, MD;  [facility];  Service: Gastroenterology;;   SHOULDER ARTHROSCOPY WITH LABRAL REPAIR Right 01/20/2016   Procedure: SHOULDER ARTHROSCOPY WITH LABRAL REPAIR;  Surgeon: Meredith Pel, MD;  Location: [place];  Service: Orthopedics;  Laterality: Right;   SUBMUCOSAL LIFTING INJECTION  12/22/2022  Procedure: SUBMUCOSAL LIFTING INJECTION;  Surgeon: Montez Morita, Quillian Quince, MD;  [facility];  Service: Gastroenterology;;   SUBMUCOSAL TATTOO INJECTION  12/22/2022   Procedure: SUBMUCOSAL TATTOO INJECTION;  Surgeon: Montez Morita, Quillian Quince, MD;  [facility];  Service: Gastroenterology;;   TONGUE SURGERY Left 2018   cancer removed   Patient Active Problem List   Diagnosis Date Noted   History of colon polyps 12/22/2022   Perianal Crohn's disease, with abscess ([place]) 07/29/2022   Vitamin D deficiency 01/21/2022   Ileus ([place]) 12/24/2021   Crohn's colitis ([place])  12/21/2021   Hypomagnesemia 12/21/2021   LLQ abdominal pain    Abdominal pain 10/06/2021   Incisional hernia, without obstruction or gangrene    Abnormal LFTs 07/14/2021   Abdominal wall pain 03/31/2021   Crohn's disease of both small and large intestine ([place]) 12/02/2020   Diarrhea 09/02/2020   C. difficile diarrhea 08/07/2020   Back spasm 05/20/2020   Rectal pain 11/20/2019   Hemorrhoids 11/20/2019   Crohn's disease ([place]) 04/19/2019   Acute bronchitis 11/29/2017   Primary insomnia 04/08/2017   Strain of back 04/08/2017   Diverticulitis large intestine 01/07/2015   Diverticulitis 07/03/2014   Hypokalemia 07/03/2014   Tobacco abuse    Obesity    Upper airway cough syndrome 01/28/2014   IBS (irritable bowel syndrome) 07/10/2013   Acute bilateral lower abdominal pain 03/26/2013   Tiredness 03/26/2013   Sigmoid diverticulitis 09/12/2012   Epigastric pain 05/22/2012   Nausea without vomiting 01/28/2012   GERD (gastroesophageal reflux disease) 01/10/2012   HTN (hypertension) 01/10/2012   REFERRING PROVIDER: Sanjuana Kava MD  REFERRING DIAG: Bilateral trochanteric bursitis  THERAPY DIAG:  Pain in left hip  Muscle weakness (generalized)  Pain in right hip  Rationale for Evaluation and Treatment Rehabilitation  ONSET DATE: 12/2021.  SUBJECTIVE:   SUBJECTIVE STATEMENT:    Pt arrives for today's treatment session denying any pain while at rest, but does have pain with certain movements.   PATIENT GOALS Get out of pain.  Walk better and do more.   OBJECTIVE:    TODAY'S TREATMENT:                                                                           EXERCISE LOG  Exercise Repetitions and Resistance Comments  Seated hip abduction Green tband 3x10 hold 5 secs   Seated glute sets X 10    Sit to stand focus on glutes x12   Bridge  x12   Side stepping    Seated Marches 2# x 20 reps bil   LAQ 2# x 20 reps bil   Ham Curls Green tband x 20 reps bil   Ball Squeezes  X4 mins   Supine Hip Abduction X15 reps bil    Blank cell = exercise not performed today       Green Tband and handout given for HEP  Manual Therapy Soft Tissue Mobilization: Right and left hip, STW/M performed the left and righ hip and IT band to decrease pain and tone.  Pt in supine for comfort with wedge under BLEs.  ASSESSMENT:  CLINICAL IMPRESSION:   Pt arrives for today's treatment session denying any pain while at rest, but does endorse pain with certain movements.  Pt does not give a pain rating.  Pt able to tolerate increased reps with previously performed exercises.  Pt introduced to additional seated BLE exercises to increase strength and function.  Pt requiring min cues to avoid leaning with LAQs and seated marches.  STW/M performed bil hips and IT band to decrease pain and tone with good results.  Pt reports feeling "pretty good" at completion of today's treatment session.   GOALS:   LONG TERM GOALS: Target date: 02/21/2023   Ind with HEP. Baseline:  Goal status: On going  2.  Sleep 6 hours undisturbed. Baseline:  Goal status: On going  3.  Perform ADL's with pain not > 3/10. Baseline:  Goal status: On going  4.  Walk with a normal gait pattern. Baseline:  Goal status: On going  PLAN: PT FREQUENCY: 2x/week  PT DURATION: 6 weeks  PLANNED INTERVENTIONS: [place], E'stim, STW/M, TE, dry needling.  PLAN FOR NEXT SESSION:  Review HEP  We did discuss instructing her in supine hip abduction with theraband for a HEP though.  Patient would like treatments focused on left hip.  [place], STW/M.  Kathrynn Ducking, PTA 01/10/2023, 10:40 AM

## 2023-01-13 ENCOUNTER — Ambulatory Visit: Payer: PPO | Attending: Orthopaedic Surgery

## 2023-01-13 DIAGNOSIS — M25551 Pain in right hip: Secondary | ICD-10-CM | POA: Diagnosis not present

## 2023-01-13 DIAGNOSIS — M25552 Pain in left hip: Secondary | ICD-10-CM | POA: Insufficient documentation

## 2023-01-13 DIAGNOSIS — M6281 Muscle weakness (generalized): Secondary | ICD-10-CM | POA: Diagnosis not present

## 2023-01-13 NOTE — Therapy (Signed)
OUTPATIENT PHYSICAL THERAPY LOWER EXTREMITY TREATMENT   Patient Name: Elizabeth [name] MRN: [id number] DOB:1961-02-16, 62 y.o., female Today's Date: 01/13/2023   PT End of Session - 01/13/23 0901     Visit Number 14    Number of Visits 18    Date for PT Re-Evaluation 01/26/23    Authorization Type FOTO AT LEAST EVERY 5TH VISIT.  PROGRESS NOTE AT 10TH VISIT.  KX MODIFIER AFTER 15 VISITS.    PT Start Time 0900    Activity Tolerance Patient tolerated treatment well    Behavior During Therapy [facility] for tasks assessed/performed             Past Medical History:  Diagnosis Date   Arthritis    Bronchitis    C. difficile diarrhea    Cancer ([place])    Cough 01/28/2014   upper airway cough syndrome   Crohn's disease ([place])    Diverticulitis    DVT 06/2020   history of   Elevated transaminase level    GERD (gastroesophageal reflux disease)    Hypertension    Obesity    Pneumonia 12/2013   Tobacco abuse    Past Surgical History:  Procedure Laterality Date   ABDOMINAL HYSTERECTOMY  12/13/1978   APPENDECTOMY     BIOPSY  11/11/2021   Procedure: BIOPSY;  Surgeon: Rogene Houston, MD;  [facility];  Service: Endoscopy;;  bx of polyp and small bowel   COLON SURGERY     COLONOSCOPY     COLONOSCOPY N/A 08/16/2014   Procedure: COLONOSCOPY;  Surgeon: Rogene Houston, MD;  [facility];  Service: Endoscopy;  Laterality: N/A;  1200-rescheduled to 9/4 @ 10:45 Ann notified pt   COLONOSCOPY WITH PROPOFOL N/A 11/11/2021   Procedure: COLONOSCOPY WITH PROPOFOL;  Surgeon: Rogene Houston, MD;  [facility];  Service: Endoscopy;  Laterality: N/A;  2:05   COLONOSCOPY WITH PROPOFOL N/A 12/22/2022   Procedure: COLONOSCOPY WITH PROPOFOL;  Surgeon: Harvel Quale, MD;  [facility];  Service: Gastroenterology;  Laterality: N/A;  11:15am, asa 1-2   ESOPHAGOGASTRODUODENOSCOPY  06/16/2012   Procedure: ESOPHAGOGASTRODUODENOSCOPY (EGD);  Surgeon: Rogene Houston, MD;  [facility];  Service: Endoscopy;  Laterality: N/A;  10:30 AM   hemocolectomy  12/14/2003   right   HEMOSTASIS CLIP PLACEMENT  12/22/2022   Procedure: HEMOSTASIS CLIP PLACEMENT;  Surgeon: Harvel Quale, MD;  [facility];  Service: Gastroenterology;;   [place] Resection  2005   INCISION AND DRAINAGE ABSCESS N/A 11/10/2022   Procedure: INCISION AND DRAINAGE ABSCESS, PERIANAL;  Surgeon: Aviva Signs, MD;  Location: [facility];  Service: General;  Laterality: N/A;   [place] N/A 08/24/2021   Procedure: HERNIA REPAIR INCISIONAL WITH MESH;  Surgeon: Aviva Signs, MD;  Location: [facility];  Service: General;  Laterality: N/A;   LAPAROSCOPIC SIGMOID COLECTOMY N/A 01/07/2015   Procedure: LOW ANTERIOR COLON RESECTION;  Surgeon: Fanny Skates, MD;  Location: [place];  Service: General;  Laterality: N/A;   POLYPECTOMY  12/22/2022   Procedure: POLYPECTOMY;  Surgeon: Harvel Quale, MD;  [facility];  Service: Gastroenterology;;   SHOULDER ARTHROSCOPY WITH LABRAL REPAIR Right 01/20/2016   Procedure: SHOULDER ARTHROSCOPY WITH LABRAL REPAIR;  Surgeon: Meredith Pel, MD;  Location: [place];  Service: Orthopedics;  Laterality: Right;   SUBMUCOSAL LIFTING INJECTION  12/22/2022   Procedure: SUBMUCOSAL LIFTING INJECTION;  Surgeon: Harvel Quale, MD;  [facility];  Service: Gastroenterology;;   SUBMUCOSAL TATTOO INJECTION  12/22/2022   Procedure: SUBMUCOSAL TATTOO INJECTION;  Surgeon: Montez Morita, Quillian Quince, MD;  [facility];  Service: Gastroenterology;;   TONGUE SURGERY Left 2018   cancer removed   Patient Active Problem List   Diagnosis Date Noted   History of colon polyps 12/22/2022   Perianal Crohn's disease, with abscess ([place]) 07/29/2022   Vitamin D deficiency 01/21/2022   Ileus ([place]) 12/24/2021   Crohn's colitis ([place]) 12/21/2021   Hypomagnesemia 12/21/2021   LLQ abdominal pain     Abdominal pain 10/06/2021   Incisional hernia, without obstruction or gangrene    Abnormal LFTs 07/14/2021   Abdominal wall pain 03/31/2021   Crohn's disease of both small and large intestine ([place]) 12/02/2020   Diarrhea 09/02/2020   C. difficile diarrhea 08/07/2020   Back spasm 05/20/2020   Rectal pain 11/20/2019   Hemorrhoids 11/20/2019   Crohn's disease ([place]) 04/19/2019   Acute bronchitis 11/29/2017   Primary insomnia 04/08/2017   Strain of back 04/08/2017   Diverticulitis large intestine 01/07/2015   Diverticulitis 07/03/2014   Hypokalemia 07/03/2014   Tobacco abuse    Obesity    Upper airway cough syndrome 01/28/2014   IBS (irritable bowel syndrome) 07/10/2013   Acute bilateral lower abdominal pain 03/26/2013   Tiredness 03/26/2013   Sigmoid diverticulitis 09/12/2012   Epigastric pain 05/22/2012   Nausea without vomiting 01/28/2012   GERD (gastroesophageal reflux disease) 01/10/2012   HTN (hypertension) 01/10/2012   REFERRING PROVIDER: Sanjuana Kava MD  REFERRING DIAG: Bilateral trochanteric bursitis  THERAPY DIAG:  Pain in left hip  Muscle weakness (generalized)  Pain in right hip  Rationale for Evaluation and Treatment Rehabilitation  ONSET DATE: 12/2021.  SUBJECTIVE:   SUBJECTIVE STATEMENT:    Pt arrives for today's treatment session reporting increased pain.  Wasn't able to to sleep much last night.   PATIENT GOALS Get out of pain.  Walk better and do more.   OBJECTIVE:    TODAY'S TREATMENT:                                                                           EXERCISE LOG  Exercise Repetitions and Resistance Comments  Seated hip abduction    Seated glute sets    Sit to stand focus on glutes    Bridge     Side stepping    Seated Marches    LAQ    Ham Curls    Ball Squeezes    Supine Hip Abduction     Blank cell = exercise not performed today        Manual Therapy Soft Tissue Mobilization: Right and left hip, STW/M performed the left  and righ hip and IT band to decrease pain and tone.  Pt in supine for comfort with wedge under BLEs.                          Modalities  Date:  Unattended Estim: Hip, Pre mod, 15 mins, Pain Ultrasound: Hip, 1.5w/cm2, 16  mins, Pain  ASSESSMENT:  CLINICAL IMPRESSION:   Pt arrives for today's treatment session reporting 5/10 bil hip pain with movement.  Pt able to increase FOTO score to 57 today.  With increased pain and soreness today pt declined performance of exercises.  STW/M performed to bil hips in greater trochanter region to decrease pain and tone.  Noteable tenderness with palapation.  Normal responses to all modalities noted upon removal. Pt reported decreased pain at completion of today's treatment session.   GOALS:   LONG TERM GOALS: Target date: 02/24/2023   Ind with HEP. Baseline:  Goal status: On going  2.  Sleep 6 hours undisturbed. Baseline:  Goal status: On going  3.  Perform ADL's with pain not > 3/10. Baseline:  Goal status: On going  4.  Walk with a normal gait pattern. Baseline:  Goal status: On going  PLAN: PT FREQUENCY: 2x/week  PT DURATION: 6 weeks  PLANNED INTERVENTIONS: [place], E'stim, STW/M, TE, dry needling.  PLAN FOR NEXT SESSION:  Review HEP  We did discuss instructing her in supine hip abduction with theraband for a HEP though.  Patient would like treatments focused on left hip.  [place], STW/M.  Kathrynn Ducking, PTA 01/13/2023, 9:59 AM

## 2023-01-17 ENCOUNTER — Telehealth: Payer: Self-pay | Admitting: Orthopaedic Surgery

## 2023-01-17 NOTE — Telephone Encounter (Signed)
Returned the patient's call regarding scheduling an appointment, lvm for her to call us.

## 2023-01-18 ENCOUNTER — Encounter: Payer: Self-pay | Admitting: Orthopaedic Surgery

## 2023-01-18 ENCOUNTER — Ambulatory Visit: Payer: PPO | Admitting: *Deleted

## 2023-01-18 ENCOUNTER — Ambulatory Visit (INDEPENDENT_AMBULATORY_CARE_PROVIDER_SITE_OTHER): Payer: PPO | Admitting: Orthopaedic Surgery

## 2023-01-18 ENCOUNTER — Telehealth: Payer: Self-pay | Admitting: Orthopaedic Surgery

## 2023-01-18 DIAGNOSIS — M7061 Trochanteric bursitis, right hip: Secondary | ICD-10-CM

## 2023-01-18 DIAGNOSIS — F17210 Nicotine dependence, cigarettes, uncomplicated: Secondary | ICD-10-CM

## 2023-01-18 DIAGNOSIS — M7062 Trochanteric bursitis, left hip: Secondary | ICD-10-CM

## 2023-01-18 MED ORDER — HYDROCODONE-ACETAMINOPHEN 10-325 MG PO TABS: ORAL_TABLET | ORAL | 0 refills | Status: DC

## 2023-01-18 MED ORDER — PREDNISONE 10 MG (21) PO TBPK: ORAL_TABLET | ORAL | 1 refills | Status: DC

## 2023-01-18 NOTE — Progress Notes (Signed)
My hips hurt bad.  She has pain over both trochanteric areas, more on the left.  She is going to PT.  She still has pain.  She has no new trauma.  I have reviewed the PT notes.  She is very tender over both hips more on the left.  ROM is full.  She has no effusion or redness.  Encounter Diagnoses  Name Primary?   Trochanteric bursitis, left hip Yes   Trochanteric bursitis, right hip    Nicotine dependence, cigarettes, uncomplicated    I will give prednisone by mouth, dose pack.  I will get MRI of the left hip.  I have reviewed the [place] web site prior to prescribing narcotic medicine for this patient.  Continue PT.  Return in two weeks.  Electronically Signed Sanjuana Kava, MD 2/6/20248:34 AM

## 2023-01-18 NOTE — Telephone Encounter (Signed)
Patient lvm requesting to have her pain meds changed to Percocet instead of Hydrocodone.  She stated that no one in [facility] has the Hydrocodone.

## 2023-01-18 NOTE — Patient Instructions (Addendum)
While we are working on your approval for MRI please go ahead and call to schedule your appointment with [place] within at least one (1) week.   Central Scheduling [phone]  Steps to Quit Smoking Smoking tobacco is the leading cause of preventable death. It can affect almost every organ in the body. Smoking puts you and people around you at risk for many serious, long-lasting (chronic) diseases. Quitting smoking can be hard, but it is one of the best things that you can do for your health. It is never too late to quit. Do not give up if you cannot quit the first time. Some people need to try many times to quit. Do your best to stick to your quit plan, and talk with your doctor if you have any questions or concerns. How do I get ready to quit? Pick a date to quit. Set a date within the next 2 weeks to give you time to prepare. Write down the reasons why you are quitting. Keep this list in places where you will see it often. Tell your family, friends, and co-workers that you are quitting. Their support is important. Talk with your doctor about the choices that may help you quit. Find out if your health insurance will pay for these treatments. Know the people, places, things, and activities that make you want to smoke (triggers). Avoid them. What first steps can I take to quit smoking? Throw away all cigarettes at home, at work, and in your car. Throw away the things that you use when you smoke, such as ashtrays and lighters. Clean your car. Empty the ashtray. Clean your home, including curtains and carpets. What can I do to help me quit smoking? Talk with your doctor about taking medicines and seeing a counselor. You are more likely to succeed when you do both. If you are pregnant or breastfeeding: Talk with your doctor about counseling or other ways to quit smoking. Do not take medicine to help you quit smoking unless your doctor tells you to. Quit right away Quit smoking  completely, instead of slowly cutting back on how much you smoke over a period of time. Stopping smoking right away may be more successful than slowly quitting. Go to counseling. In-person is best if this is an option. You are more likely to quit if you go to counseling sessions regularly. Take medicine You may take medicines to help you quit. Some medicines need a prescription, and some you can buy over-the-counter. Some medicines may contain a drug called nicotine to replace the nicotine in cigarettes. Medicines may: Help you stop having the desire to smoke (cravings). Help to stop the problems that come when you stop smoking (withdrawal symptoms). Your doctor may ask you to use: Nicotine patches, gum, or lozenges. Nicotine inhalers or sprays. Non-nicotine medicine that you take by mouth. Find resources Find resources and other ways to help you quit smoking and remain smoke-free after you quit. They include: Online chats with a Social worker. Phone quitlines. Printed Furniture conservator/restorer. Support groups or group counseling. Text messaging programs. Mobile phone apps. Use apps on your mobile phone or tablet that can help you stick to your quit plan. Examples of free services include Quit Guide from the CDC and smokefree.gov  What can I do to make it easier to quit?  Talk to your family and friends. Ask them to support and encourage you. Call a phone quitline, such as 1-800-QUIT-NOW, reach out to support groups, or work with a Social worker.  Ask people who smoke to not smoke around you. Avoid places that make you want to smoke, such as: Bars. Parties. Smoke-break areas at work. Spend time with people who do not smoke. Lower the stress in your life. Stress can make you want to smoke. Try these things to lower stress: Getting regular exercise. Doing deep-breathing exercises. Doing yoga. Meditating. What benefits will I see if I quit smoking? Over time, you may have: A better sense of smell  and taste. Less coughing and sore throat. A slower heart rate. Lower blood pressure. Clearer skin. Better breathing. Fewer sick days. Summary Quitting smoking can be hard, but it is one of the best things that you can do for your health. Do not give up if you cannot quit the first time. Some people need to try many times to quit. When you decide to quit smoking, make a plan to help you succeed. Quit smoking right away, not slowly over a period of time. When you start quitting, get help and support to keep you smoke-free. This information is not intended to replace advice given to you by your health care provider. Make sure you discuss any questions you have with your health care provider. Document Revised: 11/20/2021 Document Reviewed: 11/20/2021 Elsevier Patient Education  [place].

## 2023-01-19 ENCOUNTER — Telehealth: Payer: Self-pay

## 2023-01-19 MED ORDER — HYDROCODONE-ACETAMINOPHEN 5-325 MG PO TABS: 1.0000 | ORAL_TABLET | Freq: Four times a day (QID) | ORAL | 0 refills | Status: DC | PRN

## 2023-01-19 NOTE — Telephone Encounter (Signed)
Provider will change patients dose to Hydrocodone 5-'325mg'$  1-2 tablets. Patient aware and is in agreement with treatment plan. Request sent to provider.

## 2023-01-20 ENCOUNTER — Ambulatory Visit (INDEPENDENT_AMBULATORY_CARE_PROVIDER_SITE_OTHER): Payer: PPO | Admitting: Gastroenterology

## 2023-01-20 ENCOUNTER — Encounter (INDEPENDENT_AMBULATORY_CARE_PROVIDER_SITE_OTHER): Payer: Self-pay | Admitting: Gastroenterology

## 2023-01-20 VITALS — BP 119/81 | HR 77 | Temp 97.9°F | Ht 67.0 in | Wt 212.3 lb

## 2023-01-20 DIAGNOSIS — Z7961 Long term (current) use of immunomodulator: Secondary | ICD-10-CM

## 2023-01-20 DIAGNOSIS — Z79620 Long term (current) use of immunosuppressive biologic: Secondary | ICD-10-CM | POA: Diagnosis not present

## 2023-01-20 DIAGNOSIS — K50818 Crohn's disease of both small and large intestine with other complication: Secondary | ICD-10-CM | POA: Diagnosis not present

## 2023-01-20 NOTE — Patient Instructions (Signed)
I would like to update some basic labs to include TB testing since you are on Skyrizi, I will give you the lab orders and you can have these drawn at PCP when you see them next. Please have them fax the results to [place]. Continue Skyrizi at your current dosage We will plan to repeat Colonoscopy in July 2024 Please continue to work on smoking cessation  Follow up 6 months   It was a pleasure to meet you today. I want to create trusting relationships with patients and provide genuine, compassionate, and quality care. I truly value your feedback! please be on the lookout for a survey regarding your visit with me today. I appreciate your input about our visit and your time in completing this!    Kazia Grisanti L. Alver Sorrow, MSN, APRN, AGNP-C Adult-Gerontology Nurse Practitioner [facility] at [facility]

## 2023-01-20 NOTE — Progress Notes (Addendum)
Referring Provider: Caryl Bis, MD Primary Care Physician:  Caryl Bis, MD Primary GI Physician: Jenetta Downer   Chief Complaint  Patient presents with   Crohn's Disease    Follow up on Crohn's. Taking skyrizi injections.    HPI:   Elizabeth [name] is a 62 y.o. female with past medical history of  ileocolonic c Crohn's disease status post ileocecectomy and partial colectomy, DVT on Xarelto, recurrent C. Diff, hypertension, GERD, obesity   Patient presenting today for follow up of Crohn's disease.  Last seen August 2023, at that time previously started on Bactrim for a "boil in her buttock". She states that since then she presented some  nausea. having 1-2 Bms per day which have normal consistency. patient has previously failed management with Humira every week.  Has previously been on 6-MP.   At time of last visit, she was currently on [place] management for her Crohn's disease.  had already started the IV induction and set to be finished with this the day after her OV.  plans to then start 360 mg 4 weeks later and then every 8-week dosing subcutaneously. Patient reports that she is still smoking - smokes half a pack every 3-4 days.  Recommended to schedule colonoscopy mid 26-Oct-2023, continue skyrizi, stop bactrim and start flagyl, CBC, CMP, CRP, iron panel  Labs done September 2023 with normal iron studies, CRP <1, WBC 10.5 BMP in 26-Oct-2023 normal other than potassium 3.2 and calcium 8.4, magnesium 1.2   Present:  Has been on Skyrizi since August. She feels this Is controlling her symptoms well.  Patient reports she is doing good in regards to her Crohn's disease. She is having 1-2 formed stools per day. No loose stools, rectal bleeding,  mucus in stools. Denies abdominal pain. Appetite is good. She is on steroids again for bursitis. Feeling that she can eat most anything without GI side effects. No red flag symptoms. Patient denies melena, hematochezia, nausea, vomiting, diarrhea,  constipation, dysphagia, odyonophagia, early satiety or weight loss.   She is still smoking but down to 1/2 pack per week. She is working hard on trying to quit.   Last Colonoscopy:12/22/22- Hemorrhoids found on perianal exam. - The examined portion of the ileum was normal. - Non critical stenosis at the ileal surgical anastomosis. - Inflammation was found., improved compared to previous examinations. Chromoscopy performed. - One 20 mm polyp in the descending colon-Injected. Clips were placed. Clip manufacturer: area distal was tattooed. - Patent end-to-side colo-colonic anastomosis,  characterized by healthy appearing mucosa. - Three 3 to 6 mm polyps in the rectum - One 1 mm polyp in the rectum - The distal rectum and anal verge are normal on  retroflexion view. (1 SSP and 2 TAs)   Recommendations:  Repeat colonoscopy July 2024  Past Medical History:  Diagnosis Date   Arthritis    Bronchitis    C. difficile diarrhea    Cancer ([place])    Cough 01/28/2014   upper airway cough syndrome   Crohn's disease ([place])    Diverticulitis    DVT 06/2020   history of   Elevated transaminase level    GERD (gastroesophageal reflux disease)    Hypertension    Obesity    Pneumonia 12/2013   Tobacco abuse     Past Surgical History:  Procedure Laterality Date   ABDOMINAL HYSTERECTOMY  12/13/1978   APPENDECTOMY     BIOPSY  11/11/2021   Procedure: BIOPSY;  Surgeon: Rogene Houston, MD;  [facility];  Service: Endoscopy;;  bx of polyp and small bowel   COLON SURGERY     COLONOSCOPY     COLONOSCOPY N/A 08/16/2014   Procedure: COLONOSCOPY;  Surgeon: Rogene Houston, MD;  [facility];  Service: Endoscopy;  Laterality: N/A;  1200-rescheduled to 9/4 @ 10:45 Ann notified pt   COLONOSCOPY WITH PROPOFOL N/A 11/11/2021   Procedure: COLONOSCOPY WITH PROPOFOL;  Surgeon: Rogene Houston, MD;  [facility];  Service: Endoscopy;  Laterality: N/A;  2:05   COLONOSCOPY  WITH PROPOFOL N/A 12/22/2022   Procedure: COLONOSCOPY WITH PROPOFOL;  Surgeon: Harvel Quale, MD;  [facility];  Service: Gastroenterology;  Laterality: N/A;  11:15am, asa 1-2   ESOPHAGOGASTRODUODENOSCOPY  06/16/2012   Procedure: ESOPHAGOGASTRODUODENOSCOPY (EGD);  Surgeon: Rogene Houston, MD;  [facility];  Service: Endoscopy;  Laterality: N/A;  10:30 AM   hemocolectomy  12/14/2003   right   HEMOSTASIS CLIP PLACEMENT  12/22/2022   Procedure: HEMOSTASIS CLIP PLACEMENT;  Surgeon: Harvel Quale, MD;  [facility];  Service: Gastroenterology;;   Illeocecal Resection  2005   INCISION AND DRAINAGE ABSCESS N/A 11/10/2022   Procedure: INCISION AND DRAINAGE ABSCESS, PERIANAL;  Surgeon: Aviva Signs, MD;  [facility];  Service: General;  Laterality: N/A;   [place] N/A 08/24/2021   Procedure: HERNIA REPAIR INCISIONAL WITH MESH;  Surgeon: Aviva Signs, MD;  [facility];  Service: General;  Laterality: N/A;   LAPAROSCOPIC SIGMOID COLECTOMY N/A 01/07/2015   Procedure: LOW ANTERIOR COLON RESECTION;  Surgeon: Fanny Skates, MD;  Location: [place];  Service: General;  Laterality: N/A;   POLYPECTOMY  12/22/2022   Procedure: POLYPECTOMY;  Surgeon: Harvel Quale, MD;  [facility];  Service: Gastroenterology;;   SHOULDER ARTHROSCOPY WITH LABRAL REPAIR Right 01/20/2016   Procedure: SHOULDER ARTHROSCOPY WITH LABRAL REPAIR;  Surgeon: Meredith Pel, MD;  Location: [place];  Service: Orthopedics;  Laterality: Right;   SUBMUCOSAL LIFTING INJECTION  12/22/2022   Procedure: SUBMUCOSAL LIFTING INJECTION;  Surgeon: Harvel Quale, MD;  [facility];  Service: Gastroenterology;;   SUBMUCOSAL TATTOO INJECTION  12/22/2022   Procedure: SUBMUCOSAL TATTOO INJECTION;  Surgeon: Montez Morita, Quillian Quince, MD;  [facility];  Service: Gastroenterology;;   TONGUE SURGERY Left 2018   cancer  removed    Current Outpatient Medications  Medication Sig Dispense Refill   albuterol (VENTOLIN HFA) 108 (90 Base) MCG/ACT inhaler Inhale 2 puffs into the lungs every 6 (six) hours as needed for wheezing or shortness of breath.     Ascorbic Acid (VITAMIN C) 1000 MG tablet Take 1,000 mg by mouth every evening.     Cholecalciferol (VITAMIN D3) 50 MCG (2000 UT) TABS Take 2,000 Units by mouth every evening.     HYDROcodone-acetaminophen (NORCO/VICODIN) 5-325 MG tablet Take 1-2 tablets by mouth every 6 (six) hours as needed for moderate pain. 30 tablet 0   omeprazole (PRILOSEC) 40 MG capsule Take 40 mg by mouth daily before breakfast.     potassium chloride (MICRO-K) 10 MEQ CR capsule Take 10 mEq by mouth 2 (two) times daily.      predniSONE (STERAPRED UNI-PAK 21 TAB) 10 MG (21) TBPK tablet Take six pills the first day; then 5 pills the next day; then 4 pills the next day; then 3 pills the next day; then 2 pills the next day and the last day take one pill by mouth.  21 tablet 1   promethazine (PHENERGAN) 25 MG tablet TAKE 1 TABLET BY MOUTH TWICE A DAY AS NEEDED 20 tablet 0   Risankizumab-rzaa (SKYRIZI) 360 MG/2.4ML SOCT Inject 360 mg into the skin every 8 (eight) weeks. 2.4 mL 7   rivaroxaban (XARELTO) 20 MG TABS tablet Take 1 tablet (20 mg total) by mouth daily with supper. 30 tablet    valsartan-hydrochlorothiazide (DIOVAN-HCT) 160-12.5 MG per tablet Take 1 tablet by mouth in the morning.     zinc gluconate 50 MG tablet Take 50 mg by mouth daily.     No current facility-administered medications for this visit.    Allergies as of 01/20/2023 - Review Complete 01/20/2023  Allergen Reaction Noted   Contrast media [iodinated contrast media] Hives and Rash 01/10/2012   Penicillins Hives and Rash 01/10/2012    Family History  Problem Relation Age of Onset   Healthy Mother    Lung cancer Father        smoked   Allergic Disorder Brother    Healthy Daughter     Social History   Socioeconomic  History   Marital status: Married    Spouse name: Not on file   Number of children: Not on file   Years of education: Not on file   Highest education level: Not on file  Occupational History   Occupation: Wound Treatment Nurse    Employer: [employer]  Tobacco Use   Smoking status: Every Day    Packs/day: 0.25    Years: 40.00    Total pack years: 10.00    Types: Cigarettes    Passive exposure: Current   Smokeless tobacco: Never   Tobacco comments:    increase smoking , very nervous  Vaping Use   Vaping Use: Never used  Substance and Sexual Activity   Alcohol use: Not Currently    Alcohol/week: 0.0 standard drinks of alcohol    Comment: occ   Drug use: No   Sexual activity: Yes  Other Topics Concern   Not on file  Social History Narrative   Not on file   Social Determinants of Health   Financial Resource Strain: Not on file  Food Insecurity: Not on file  Transportation Needs: Not on file  Physical Activity: Not on file  Stress: Not on file  Social Connections: Not on file    Review of systems General: negative for malaise, night sweats, fever, chills, weight los Neck: Negative for lumps, goiter, pain and significant neck swelling Resp: Negative for cough, wheezing, dyspnea at rest CV: Negative for chest pain, leg swelling, palpitations, orthopnea GI: denies melena, hematochezia, nausea, vomiting, diarrhea, constipation, dysphagia, odyonophagia, early satiety or unintentional weight loss.  MSK: Negative for joint pain or swelling, back pain, and muscle pain. Derm: Negative for itching or rash Psych: Denies depression, anxiety, memory loss, confusion. No homicidal or suicidal ideation.  Heme: Negative for prolonged bleeding, bruising easily, and swollen nodes. Endocrine: Negative for cold or heat intolerance, polyuria, polydipsia and goiter. Neuro: negative for tremor, gait imbalance, syncope and seizures. The remainder of the review of systems is  noncontributory.  Physical Exam: BP 119/81 (BP Location: Left Arm, Patient Position: Sitting, Cuff Size: Large)   Pulse 77   Temp 97.9 F (36.6 C) (Oral)   Ht 5' 7"$  (1.702 m)   Wt 212 lb 4.8 oz (96.3 kg)   BMI 33.25 kg/m  General:   Alert and oriented. No distress noted. Pleasant and cooperative.  Head:  Normocephalic and atraumatic. Eyes:  Conjuctiva clear without scleral icterus. Mouth:  Oral mucosa pink and moist. Good dentition. No lesions. Heart: Normal rate and rhythm, s1 and s2 heart sounds present.  Lungs: Clear lung sounds in all lobes. Respirations equal and unlabored. Abdomen:  +BS, soft, non-tender and non-distended. No rebound or guarding. No HSM or masses noted. Derm: No palmar erythema or jaundice Msk:  Symmetrical without gross deformities. Normal posture. Extremities:  Without edema. Neurologic:  Alert and  oriented x4 Psych:  Alert and cooperative. Normal mood and affect.  Invalid input(s): "6 MONTHS"   ASSESSMENT: Elizabeth [name] is a 62 y.o. female presenting today for follow up of Crohn's disease.  Crohn's disease: s/p surgical resection of small bowel and colon in the past. Failed Humira and intolerant to immunomodulators such as azathioprine. Initiation of Skyrizi in August, currently on 381m Q8w and she has done well with this. Feels that her Crohn's is clinically well controlled with 1-2 formed stools per day. No abdominal pain, diarrhea, rectal bleeding, mucus in stools. Last colonoscopy in January 2024 with Non critical stenosis at the ileal surgical anastomosis, Inflammation was found., improved compared to previous examinations, multiple polyps but one larger 256mpolyp resected in piecemeal fashion, recommended to have repeat Colonoscopy in 6 months for re evaluation of this. At this time will update TB testing, CMP and CBC, continue Skyrizi at current dose and plan for repeat TCS in July 2024.   She has cut smoking down to 1/2 pack per week, she was again  counseled regarding the importance of stopping cigarette smoking. The patient was informed about the long term effects of smoking, progression of current disease.   PLAN:  TB quant, CMP, CBC  2.  Continue skyrizi 36016m8w 3. Repeat colonoscopy in July 2024.   4.  Smoking cessation**  All questions were answered, patient verbalized understanding and is in agreement with plan as outlined above.   Follow Up: 6 months   Strummer Canipe L. CarAlver SorrowSN, APRN, AGNP-C Adult-Gerontology Nurse Practitioner [facility]  I have reviewed the note and agree with the APP's assessment as described in this progress note  DanMaylon PeppersD Gastroenterology and Hepatology [facility]

## 2023-01-21 ENCOUNTER — Ambulatory Visit: Payer: PPO | Admitting: Physical Therapy

## 2023-01-21 DIAGNOSIS — M6281 Muscle weakness (generalized): Secondary | ICD-10-CM

## 2023-01-21 DIAGNOSIS — M25551 Pain in right hip: Secondary | ICD-10-CM

## 2023-01-21 DIAGNOSIS — M25552 Pain in left hip: Secondary | ICD-10-CM

## 2023-01-21 NOTE — Therapy (Signed)
OUTPATIENT PHYSICAL THERAPY LOWER EXTREMITY TREATMENT   Patient Name: Elizabeth [name] MRN: [id number] DOB:March 04, 1961, 62 y.o., female Today's Date: 01/21/2023   PT End of Session - 01/21/23 0923     Visit Number 15    Number of Visits 18    Date for PT Re-Evaluation 01/26/23    Authorization Type FOTO AT LEAST EVERY 5TH VISIT.  PROGRESS NOTE AT 10TH VISIT.  KX MODIFIER AFTER 15 VISITS.    PT Start Time 0900    PT Stop Time 0935    PT Time Calculation (min) 35 min    Activity Tolerance Patient tolerated treatment well    Behavior During Therapy WFL for tasks assessed/performed             Past Medical History:  Diagnosis Date   Arthritis    Bronchitis    C. difficile diarrhea    Cancer ([place])    Cough 01/28/2014   upper airway cough syndrome   Crohn's disease ([place])    Diverticulitis    DVT 06/2020   history of   Elevated transaminase level    GERD (gastroesophageal reflux disease)    Hypertension    Obesity    Pneumonia 12/2013   Tobacco abuse    Past Surgical History:  Procedure Laterality Date   ABDOMINAL HYSTERECTOMY  12/13/1978   APPENDECTOMY     BIOPSY  11/11/2021   Procedure: BIOPSY;  Surgeon: Rogene Houston, MD;  [facility];  Service: Endoscopy;;  bx of polyp and small bowel   COLON SURGERY     COLONOSCOPY     COLONOSCOPY N/A 08/16/2014   Procedure: COLONOSCOPY;  Surgeon: Rogene Houston, MD;  [facility];  Service: Endoscopy;  Laterality: N/A;  1200-rescheduled to 9/4 @ 10:45 Ann notified pt   COLONOSCOPY WITH PROPOFOL N/A 11/11/2021   Procedure: COLONOSCOPY WITH PROPOFOL;  Surgeon: Rogene Houston, MD;  [facility];  Service: Endoscopy;  Laterality: N/A;  2:05   COLONOSCOPY WITH PROPOFOL N/A 12/22/2022   Procedure: COLONOSCOPY WITH PROPOFOL;  Surgeon: Harvel Quale, MD;  [facility];  Service: Gastroenterology;  Laterality: N/A;  11:15am, asa 1-2   ESOPHAGOGASTRODUODENOSCOPY  06/16/2012    Procedure: ESOPHAGOGASTRODUODENOSCOPY (EGD);  Surgeon: Rogene Houston, MD;  [facility];  Service: Endoscopy;  Laterality: N/A;  10:30 AM   hemocolectomy  12/14/2003   right   HEMOSTASIS CLIP PLACEMENT  12/22/2022   Procedure: HEMOSTASIS CLIP PLACEMENT;  Surgeon: Harvel Quale, MD;  [facility];  Service: Gastroenterology;;   Illeocecal Resection  2005   INCISION AND DRAINAGE ABSCESS N/A 11/10/2022   Procedure: INCISION AND DRAINAGE ABSCESS, PERIANAL;  Surgeon: Aviva Signs, MD;  [facility];  Service: General;  Laterality: N/A;   [place] N/A 08/24/2021   Procedure: HERNIA REPAIR INCISIONAL WITH MESH;  Surgeon: Aviva Signs, MD;  [facility];  Service: General;  Laterality: N/A;   LAPAROSCOPIC SIGMOID COLECTOMY N/A 01/07/2015   Procedure: LOW ANTERIOR COLON RESECTION;  Surgeon: Fanny Skates, MD;  Location: [place];  Service: General;  Laterality: N/A;   POLYPECTOMY  12/22/2022   Procedure: POLYPECTOMY;  Surgeon: Harvel Quale, MD;  [facility];  Service: Gastroenterology;;   SHOULDER ARTHROSCOPY WITH LABRAL REPAIR Right 01/20/2016   Procedure: SHOULDER ARTHROSCOPY WITH LABRAL REPAIR;  Surgeon: Meredith Pel, MD;  Location: [place];  Service: Orthopedics;  Laterality: Right;   SUBMUCOSAL LIFTING INJECTION  12/22/2022  Procedure: SUBMUCOSAL LIFTING INJECTION;  Surgeon: Montez Morita, Quillian Quince, MD;  [facility];  Service: Gastroenterology;;   SUBMUCOSAL TATTOO INJECTION  12/22/2022   Procedure: SUBMUCOSAL TATTOO INJECTION;  Surgeon: Montez Morita, Quillian Quince, MD;  [facility];  Service: Gastroenterology;;   TONGUE SURGERY Left 2018   cancer removed   Patient Active Problem List   Diagnosis Date Noted   History of colon polyps 12/22/2022   Perianal Crohn's disease, with abscess ([place]) 07/29/2022   Vitamin D deficiency 01/21/2022   Ileus ([place]) 12/24/2021   Crohn's colitis ([place])  12/21/2021   Hypomagnesemia 12/21/2021   LLQ abdominal pain    Abdominal pain 10/06/2021   Incisional hernia, without obstruction or gangrene    Abnormal LFTs 07/14/2021   Abdominal wall pain 03/31/2021   Crohn's disease of both small and large intestine ([place]) 12/02/2020   Diarrhea 09/02/2020   C. difficile diarrhea 08/07/2020   Back spasm 05/20/2020   Rectal pain 11/20/2019   Hemorrhoids 11/20/2019   Crohn's disease ([place]) 04/19/2019   Acute bronchitis 11/29/2017   Primary insomnia 04/08/2017   Strain of back 04/08/2017   Diverticulitis large intestine 01/07/2015   Diverticulitis 07/03/2014   Hypokalemia 07/03/2014   Tobacco abuse    Obesity    Upper airway cough syndrome 01/28/2014   IBS (irritable bowel syndrome) 07/10/2013   Acute bilateral lower abdominal pain 03/26/2013   Tiredness 03/26/2013   Sigmoid diverticulitis 09/12/2012   Epigastric pain 05/22/2012   Nausea without vomiting 01/28/2012   GERD (gastroesophageal reflux disease) 01/10/2012   HTN (hypertension) 01/10/2012   REFERRING PROVIDER: Sanjuana Kava MD  REFERRING DIAG: Bilateral trochanteric bursitis  THERAPY DIAG:  Pain in left hip  Muscle weakness (generalized)  Pain in right hip  Rationale for Evaluation and Treatment Rehabilitation  ONSET DATE: 12/2021.  SUBJECTIVE:   SUBJECTIVE STATEMENT:    On Prednisone and have no pain today. PATIENT GOALS Get out of pain.  Walk better and do more.   OBJECTIVE:    TODAY'S TREATMENT:                                                                           EXERCISE LOG  Exercise Repetitions and Resistance Comments  Nustep  Level 3 x 15 minutes.   Knee ext 10# x 3 minutes   Ham curls 30# x 3 minutes   Inverted BOSU in parallel bars for neuro re-edu. 3 minutes.   Bilateral 4 inch step-ups To fatigue.                               ASSESSMENT:  CLINICAL IMPRESSION:   Patient did well with therex including weight machines and inverted BOSU  in parallel bars.  No modalities today due to no pain complaints.   GOALS:   LONG TERM GOALS: Target date: 03/04/2023   Ind with HEP. Baseline:  Goal status: On going  2.  Sleep 6 hours undisturbed. Baseline:  Goal status: On going  3.  Perform ADL's with pain not > 3/10. Baseline:  Goal status: On going  4.  Walk with a normal gait pattern. Baseline:  Goal status: On going  PLAN: PT FREQUENCY: 2x/week  PT DURATION: 6 weeks  PLANNED INTERVENTIONS: [place], E'stim, STW/M, TE, dry needling.  PLAN FOR NEXT SESSION:  Review HEP  We did discuss instructing her in supine hip abduction with theraband for a HEP though.  Patient would like treatments focused on left hip.  [place], STW/M.  Davontay Watlington, [place], PT 01/21/2023, 10:37 AM

## 2023-01-24 ENCOUNTER — Ambulatory Visit: Payer: PPO

## 2023-01-24 DIAGNOSIS — M25552 Pain in left hip: Secondary | ICD-10-CM

## 2023-01-24 DIAGNOSIS — M6281 Muscle weakness (generalized): Secondary | ICD-10-CM

## 2023-01-24 DIAGNOSIS — Z79620 Long term (current) use of immunosuppressive biologic: Secondary | ICD-10-CM | POA: Insufficient documentation

## 2023-01-24 DIAGNOSIS — M25551 Pain in right hip: Secondary | ICD-10-CM

## 2023-01-24 NOTE — Addendum Note (Signed)
Addended by: Harvel Quale on: 01/24/2023 10:36 PM   Modules accepted: Level of Service

## 2023-01-24 NOTE — Therapy (Signed)
OUTPATIENT PHYSICAL THERAPY LOWER EXTREMITY TREATMENT   Patient Name: Elizabeth [name] MRN: [id number] DOB:[birth year], 62 y.o., female Today's Date: 01/24/2023   PT End of Session - 01/24/23 0817     Visit Number 16    Number of Visits 18    Date for PT Re-Evaluation 01/26/23    Authorization Type FOTO AT LEAST EVERY 5TH VISIT.  PROGRESS NOTE AT 10TH VISIT.  KX MODIFIER AFTER 15 VISITS.    PT Start Time 0815    PT Stop Time 0855    PT Time Calculation (min) 40 min    Activity Tolerance Patient tolerated treatment well    Behavior During Therapy [facility] for tasks assessed/performed             Past Medical History:  Diagnosis Date   Arthritis    Bronchitis    C. difficile diarrhea    Cancer ([place])    Cough 01/28/2014   upper airway cough syndrome   Crohn's disease ([place])    Diverticulitis    DVT 06/2020   history of   Elevated transaminase level    GERD (gastroesophageal reflux disease)    Hypertension    Obesity    Pneumonia 12/2013   Tobacco abuse    Past Surgical History:  Procedure Laterality Date   ABDOMINAL HYSTERECTOMY  12/13/1978   APPENDECTOMY     BIOPSY  11/11/2021   Procedure: BIOPSY;  Surgeon: Rogene Houston, MD;  [facility];  Service: Endoscopy;;  bx of polyp and small bowel   COLON SURGERY     COLONOSCOPY     COLONOSCOPY N/A 08/16/2014   Procedure: COLONOSCOPY;  Surgeon: Rogene Houston, MD;  [facility];  Service: Endoscopy;  Laterality: N/A;  1200-rescheduled to 9/4 @ 10:45 Ann notified pt   COLONOSCOPY WITH PROPOFOL N/A 11/11/2021   Procedure: COLONOSCOPY WITH PROPOFOL;  Surgeon: Rogene Houston, MD;  [facility];  Service: Endoscopy;  Laterality: N/A;  2:05   COLONOSCOPY WITH PROPOFOL N/A 12/22/2022   Procedure: COLONOSCOPY WITH PROPOFOL;  Surgeon: Harvel Quale, MD;  [facility];  Service: Gastroenterology;  Laterality: N/A;  11:15am, asa 1-2   ESOPHAGOGASTRODUODENOSCOPY  06/16/2012    Procedure: ESOPHAGOGASTRODUODENOSCOPY (EGD);  Surgeon: Rogene Houston, MD;  [facility];  Service: Endoscopy;  Laterality: N/A;  10:30 AM   hemocolectomy  12/14/2003   right   HEMOSTASIS CLIP PLACEMENT  12/22/2022   Procedure: HEMOSTASIS CLIP PLACEMENT;  Surgeon: Harvel Quale, MD;  [facility];  Service: Gastroenterology;;   [place]leocecal Resection  2005   INCISION AND DRAINAGE ABSCESS N/A 11/10/2022   Procedure: INCISION AND DRAINAGE ABSCESS, PERIANAL;  Surgeon: Aviva Signs, MD;  Location: [facility];  Service: General;  Laterality: N/A;   Joiner N/A 08/24/2021   Procedure: HERNIA REPAIR INCISIONAL WITH MESH;  Surgeon: Aviva Signs, MD;  Location: [facility];  Service: General;  Laterality: N/A;   LAPAROSCOPIC SIGMOID COLECTOMY N/A 01/07/2015   Procedure: LOW ANTERIOR COLON RESECTION;  Surgeon: Fanny Skates, MD;  Location: [place];  Service: General;  Laterality: N/A;   POLYPECTOMY  12/22/2022   Procedure: POLYPECTOMY;  Surgeon: Harvel Quale, MD;  [facility];  Service: Gastroenterology;;   SHOULDER ARTHROSCOPY WITH LABRAL REPAIR Right 01/20/2016   Procedure: SHOULDER ARTHROSCOPY WITH LABRAL REPAIR;  Surgeon: Meredith Pel, MD;  Location: [place];  Service: Orthopedics;  Laterality: Right;   SUBMUCOSAL LIFTING INJECTION  12/22/2022  Procedure: SUBMUCOSAL LIFTING INJECTION;  Surgeon: Montez Morita, Quillian Quince, MD;  [facility];  Service: Gastroenterology;;   SUBMUCOSAL TATTOO INJECTION  12/22/2022   Procedure: SUBMUCOSAL TATTOO INJECTION;  Surgeon: Montez Morita, Quillian Quince, MD;  [facility];  Service: Gastroenterology;;   TONGUE SURGERY Left 2018   cancer removed   Patient Active Problem List   Diagnosis Date Noted   History of colon polyps 12/22/2022   Perianal Crohn's disease, with abscess ([place]) 07/29/2022   Vitamin D deficiency 01/21/2022   Ileus ([place]) 12/24/2021   Crohn's colitis ([place])  12/21/2021   Hypomagnesemia 12/21/2021   LLQ abdominal pain    Abdominal pain 10/06/2021   Incisional hernia, without obstruction or gangrene    Abnormal LFTs 07/14/2021   Abdominal wall pain 03/31/2021   Crohn's disease of both small and large intestine ([place]) 12/02/2020   Diarrhea 09/02/2020   C. difficile diarrhea 08/07/2020   Back spasm 05/20/2020   Rectal pain 11/20/2019   Hemorrhoids 11/20/2019   Crohn's disease ([place]) 04/19/2019   Acute bronchitis 11/29/2017   Primary insomnia 04/08/2017   Strain of back 04/08/2017   Diverticulitis large intestine 01/07/2015   Diverticulitis 07/03/2014   Hypokalemia 07/03/2014   Tobacco abuse    Obesity    Upper airway cough syndrome 01/28/2014   IBS (irritable bowel syndrome) 07/10/2013   Acute bilateral lower abdominal pain 03/26/2013   Tiredness 03/26/2013   Sigmoid diverticulitis 09/12/2012   Epigastric pain 05/22/2012   Nausea without vomiting 01/28/2012   GERD (gastroesophageal reflux disease) 01/10/2012   HTN (hypertension) 01/10/2012   REFERRING PROVIDER: Sanjuana Kava MD  REFERRING DIAG: Bilateral trochanteric bursitis  THERAPY DIAG:  Pain in left hip  Muscle weakness (generalized)  Pain in right hip  Rationale for Evaluation and Treatment Rehabilitation  ONSET DATE: 12/2021.  SUBJECTIVE:   SUBJECTIVE STATEMENT:    Pt denies any pain today.  Last day of Prednisone.  PATIENT GOALS Get out of pain.  Walk better and do more.   OBJECTIVE:    TODAY'S TREATMENT:                                                                           EXERCISE LOG  Exercise Repetitions and Resistance Comments  Nustep  Level 3 x 15 minutes.   Knee ext 10# x 3.5 minutes   Ham curls 30# x 3.5 minutes   Inverted BOSU in parallel bars for neuro re-edu. 3 minutes.   Bilateral 4 inch step-ups To fatigue.   Rockerboard 5 mins   Standing marches  To fatigue                       ASSESSMENT:  CLINICAL IMPRESSION:   Pt arrives  for today's treatment session denying any pain.  Pt on last day of Prednisone. Pt able to tolerate increased time with Cybex machines today with minimal fatigue.  Pt worried that rain and exercises today may make her hips sore.  Pt challenged by all balance activities today.  Pt denies any pain at completion of today's treatment session.    GOALS:   LONG TERM GOALS: Target date: 03/07/2023   Ind with HEP. Baseline:  Goal status:  On going  2.  Sleep 6 hours undisturbed. Baseline:  Goal status: On going  3.  Perform ADL's with pain not > 3/10. Baseline:  Goal status: On going  4.  Walk with a normal gait pattern. Baseline:  Goal status: On going  PLAN: PT FREQUENCY: 2x/week  PT DURATION: 6 weeks  PLANNED INTERVENTIONS: [place], E'stim, STW/M, TE, dry needling.  PLAN FOR NEXT SESSION:  Review HEP  We did discuss instructing her in supine hip abduction with theraband for a HEP though.  Patient would like treatments focused on left hip.  [place], STW/M.  Kathrynn Ducking, PTA 01/24/2023, 9:09 AM

## 2023-01-26 ENCOUNTER — Telehealth: Payer: Self-pay | Admitting: *Deleted

## 2023-01-26 ENCOUNTER — Encounter: Payer: Self-pay | Admitting: *Deleted

## 2023-01-26 NOTE — Telephone Encounter (Signed)
Patient called and concerned that insurance told her she owed $3,348 for a blue dye that was used during colonoscopy. She told me we would be getting a letter with details. I discussed with Reba and was told to send her a mychart message to call us in one week to see if we received the letter. The letter may end up going to the hospital. Mychart message sent to patient.

## 2023-01-31 ENCOUNTER — Ambulatory Visit (INDEPENDENT_AMBULATORY_CARE_PROVIDER_SITE_OTHER): Payer: PPO | Admitting: Gastroenterology

## 2023-02-02 NOTE — Telephone Encounter (Signed)
done 

## 2023-02-07 ENCOUNTER — Ambulatory Visit (HOSPITAL_COMMUNITY)
Admission: RE | Admit: 2023-02-07 | Discharge: 2023-02-07 | Disposition: A | Discharge status: Home / Self Care | Payer: PPO | Source: Ambulatory Visit | Attending: Orthopaedic Surgery | Admitting: Orthopaedic Surgery

## 2023-02-07 DIAGNOSIS — F331 Major depressive disorder, recurrent, moderate: Secondary | ICD-10-CM | POA: Diagnosis not present

## 2023-02-07 DIAGNOSIS — M7062 Trochanteric bursitis, left hip: Secondary | ICD-10-CM | POA: Insufficient documentation

## 2023-02-07 DIAGNOSIS — E669 Obesity, unspecified: Secondary | ICD-10-CM | POA: Diagnosis not present

## 2023-02-07 DIAGNOSIS — K5090 Crohn's disease, unspecified, without complications: Secondary | ICD-10-CM | POA: Diagnosis not present

## 2023-02-07 DIAGNOSIS — K2100 Gastro-esophageal reflux disease with esophagitis, without bleeding: Secondary | ICD-10-CM | POA: Diagnosis not present

## 2023-02-07 DIAGNOSIS — F17210 Nicotine dependence, cigarettes, uncomplicated: Secondary | ICD-10-CM | POA: Diagnosis not present

## 2023-02-07 DIAGNOSIS — I10 Essential (primary) hypertension: Secondary | ICD-10-CM | POA: Diagnosis not present

## 2023-02-07 DIAGNOSIS — I82509 Chronic embolism and thrombosis of unspecified deep veins of unspecified lower extremity: Secondary | ICD-10-CM | POA: Diagnosis not present

## 2023-02-07 DIAGNOSIS — K439 Ventral hernia without obstruction or gangrene: Secondary | ICD-10-CM | POA: Diagnosis not present

## 2023-02-07 DIAGNOSIS — M1612 Unilateral primary osteoarthritis, left hip: Secondary | ICD-10-CM | POA: Diagnosis not present

## 2023-02-07 DIAGNOSIS — Z6834 Body mass index (BMI) 34.0-34.9, adult: Secondary | ICD-10-CM | POA: Diagnosis not present

## 2023-02-10 ENCOUNTER — Encounter: Payer: Self-pay | Admitting: Orthopaedic Surgery

## 2023-02-10 ENCOUNTER — Encounter: Payer: Self-pay | Admitting: Radiology

## 2023-02-10 ENCOUNTER — Ambulatory Visit (INDEPENDENT_AMBULATORY_CARE_PROVIDER_SITE_OTHER): Payer: PPO | Admitting: Orthopaedic Surgery

## 2023-02-10 DIAGNOSIS — M7061 Trochanteric bursitis, right hip: Secondary | ICD-10-CM

## 2023-02-10 DIAGNOSIS — M7062 Trochanteric bursitis, left hip: Secondary | ICD-10-CM

## 2023-02-10 DIAGNOSIS — F17210 Nicotine dependence, cigarettes, uncomplicated: Secondary | ICD-10-CM

## 2023-02-10 MED ORDER — PREDNISONE 5 MG (21) PO TBPK: ORAL_TABLET | ORAL | 0 refills | Status: DC

## 2023-02-10 NOTE — Patient Instructions (Signed)
NO SURGERY IS RECOMMENDED FOR YOUR HIPS. THE ARTHRITIS WILL WORSEN OVER TIME AND THIS IS NORMAL  YOU HAVE BEEN PRESCRIBED ORAL PREDNISONE  DAY 1: TAKE 6 TABLETS BY MOUTH, TAKE THEM ALL AT ONE TIME DAY 2: TAKE 5 TABLETS BY MOUTH, TAKE THEM ALL AT ONE TIME DAY 3: TAKE 4 TABLETS BY MOUTH, TAKE THEM ALL AT ONE TIME DAY 4: TAKE 3 TABLETS BY MOUTH, TAKE THEM ALL AT ONE TIME DAY 5: TAKE 2 TABLETS BY MOUTH, TAKE THEM ALL AT ONE TIME DAY 6: TAKE 1 TABLET BY MOUTH

## 2023-02-10 NOTE — Progress Notes (Signed)
My hips hurt.  She had MRI of the pelvis showing: IMPRESSION: 1. Mild bilateral greater trochanteric bursitis. 2. Mild bilateral gluteal and hamstring tendinosis. 3. Mild bilateral hip osteoarthritis.   I have explained the findings to her.  I have independently reviewed the MRI.    She has pain of both hips over the trochanteric areas.  ROM of hips is good.  Encounter Diagnoses  Name Primary?   Trochanteric bursitis, left hip Yes   Trochanteric bursitis, right hip    Nicotine dependence, cigarettes, uncomplicated    I will call in dose pack of prednisone.  Return in one month.  Call if any problem.  Precautions discussed.  Electronically Signed Sanjuana Kava, MD 2/29/202410:30 AM

## 2023-02-11 DIAGNOSIS — Z1159 Encounter for screening for other viral diseases: Secondary | ICD-10-CM | POA: Diagnosis not present

## 2023-02-21 DIAGNOSIS — G43B1 Ophthalmoplegic migraine, intractable: Secondary | ICD-10-CM | POA: Diagnosis not present

## 2023-03-10 ENCOUNTER — Ambulatory Visit: Payer: PPO | Admitting: Orthopaedic Surgery

## 2023-03-15 ENCOUNTER — Ambulatory Visit (INDEPENDENT_AMBULATORY_CARE_PROVIDER_SITE_OTHER): Payer: PPO | Admitting: Orthopaedic Surgery

## 2023-03-15 ENCOUNTER — Encounter: Payer: Self-pay | Admitting: Orthopaedic Surgery

## 2023-03-15 VITALS — BP 116/78 | HR 89 | Ht 67.0 in | Wt 212.0 lb

## 2023-03-15 DIAGNOSIS — M7062 Trochanteric bursitis, left hip: Secondary | ICD-10-CM | POA: Diagnosis not present

## 2023-03-15 DIAGNOSIS — M7061 Trochanteric bursitis, right hip: Secondary | ICD-10-CM

## 2023-03-15 DIAGNOSIS — F17210 Nicotine dependence, cigarettes, uncomplicated: Secondary | ICD-10-CM

## 2023-03-15 MED ORDER — PREDNISONE 5 MG (21) PO TBPK: ORAL_TABLET | ORAL | 0 refills | Status: DC

## 2023-03-15 MED ORDER — HYDROCODONE-ACETAMINOPHEN 5-325 MG PO TABS: 1.0000 | ORAL_TABLET | Freq: Four times a day (QID) | ORAL | 0 refills | Status: DC | PRN

## 2023-03-15 NOTE — Progress Notes (Signed)
My pain has come back.  Her husband needs her care at home and she had a rough month with him.  She has recurrence of the bilateral trochanteric bursitis.  The dose pack helped but her pain has returned.  Both hips are tender over the trochanters but have full ROM.  NV intact.  Encounter Diagnoses  Name Primary?   Trochanteric bursitis, left hip Yes   Trochanteric bursitis, right hip    Nicotine dependence, cigarettes, uncomplicated    I will renew pain medicine and prednisone dose pack.  Return in five weeks.  Call if any problem.  Precautions discussed.  Electronically Signed Sanjuana Kava, MD 4/2/202410:11 AM

## 2023-04-19 ENCOUNTER — Encounter: Payer: Self-pay | Admitting: Orthopaedic Surgery

## 2023-04-19 ENCOUNTER — Ambulatory Visit: Payer: PPO | Admitting: Orthopaedic Surgery

## 2023-04-19 VITALS — BP 124/74 | HR 60 | Ht 67.0 in | Wt 212.0 lb

## 2023-04-19 DIAGNOSIS — F17210 Nicotine dependence, cigarettes, uncomplicated: Secondary | ICD-10-CM | POA: Diagnosis not present

## 2023-04-19 DIAGNOSIS — M7061 Trochanteric bursitis, right hip: Secondary | ICD-10-CM

## 2023-04-19 DIAGNOSIS — M7062 Trochanteric bursitis, left hip: Secondary | ICD-10-CM

## 2023-04-19 MED ORDER — HYDROCODONE-ACETAMINOPHEN 5-325 MG PO TABS: 1.0000 | ORAL_TABLET | Freq: Four times a day (QID) | ORAL | 0 refills | Status: DC | PRN

## 2023-04-19 NOTE — Progress Notes (Signed)
My hips hurt at night.  She has more pain to the bursa area of both hips more at night, more on the left side.  During the day she does well.  She has no new trauma.  She is taking her medicine.  Gait is good.  ROM of hips is normal.  NV intact.  She has no redness.  Encounter Diagnoses  Name Primary?   Trochanteric bursitis, left hip Yes   Trochanteric bursitis, right hip    Nicotine dependence, cigarettes, uncomplicated    I will refill pain medicine.  Use ice at night before bed.  I have reviewed the [place] Controlled Substance Reporting System web site prior to prescribing narcotic medicine for this patient.  Call if any problem.  Precautions discussed.  Electronically Signed Darreld Mclean, MD 5/7/20249:21 AM

## 2023-04-27 DIAGNOSIS — I959 Hypotension, unspecified: Secondary | ICD-10-CM | POA: Diagnosis not present

## 2023-04-27 DIAGNOSIS — Z1329 Encounter for screening for other suspected endocrine disorder: Secondary | ICD-10-CM | POA: Diagnosis not present

## 2023-04-27 DIAGNOSIS — K219 Gastro-esophageal reflux disease without esophagitis: Secondary | ICD-10-CM | POA: Diagnosis not present

## 2023-04-27 DIAGNOSIS — Z1321 Encounter for screening for nutritional disorder: Secondary | ICD-10-CM | POA: Diagnosis not present

## 2023-04-27 DIAGNOSIS — Z13220 Encounter for screening for lipoid disorders: Secondary | ICD-10-CM | POA: Diagnosis not present

## 2023-04-27 DIAGNOSIS — I10 Essential (primary) hypertension: Secondary | ICD-10-CM | POA: Diagnosis not present

## 2023-04-30 ENCOUNTER — Other Ambulatory Visit: Payer: Self-pay | Admitting: Orthopaedic Surgery

## 2023-05-04 DIAGNOSIS — Z0001 Encounter for general adult medical examination with abnormal findings: Secondary | ICD-10-CM | POA: Diagnosis not present

## 2023-05-04 DIAGNOSIS — K5090 Crohn's disease, unspecified, without complications: Secondary | ICD-10-CM | POA: Diagnosis not present

## 2023-05-04 DIAGNOSIS — F17210 Nicotine dependence, cigarettes, uncomplicated: Secondary | ICD-10-CM | POA: Diagnosis not present

## 2023-05-04 DIAGNOSIS — Z6834 Body mass index (BMI) 34.0-34.9, adult: Secondary | ICD-10-CM | POA: Diagnosis not present

## 2023-05-04 DIAGNOSIS — F331 Major depressive disorder, recurrent, moderate: Secondary | ICD-10-CM | POA: Diagnosis not present

## 2023-05-04 DIAGNOSIS — E669 Obesity, unspecified: Secondary | ICD-10-CM | POA: Diagnosis not present

## 2023-05-04 DIAGNOSIS — I82509 Chronic embolism and thrombosis of unspecified deep veins of unspecified lower extremity: Secondary | ICD-10-CM | POA: Diagnosis not present

## 2023-05-04 DIAGNOSIS — K439 Ventral hernia without obstruction or gangrene: Secondary | ICD-10-CM | POA: Diagnosis not present

## 2023-05-04 DIAGNOSIS — I10 Essential (primary) hypertension: Secondary | ICD-10-CM | POA: Diagnosis not present

## 2023-05-04 DIAGNOSIS — K2100 Gastro-esophageal reflux disease with esophagitis, without bleeding: Secondary | ICD-10-CM | POA: Diagnosis not present

## 2023-05-06 ENCOUNTER — Other Ambulatory Visit (HOSPITAL_COMMUNITY): Payer: Self-pay

## 2023-05-13 ENCOUNTER — Encounter (INDEPENDENT_AMBULATORY_CARE_PROVIDER_SITE_OTHER): Payer: Self-pay | Admitting: *Deleted

## 2023-05-19 ENCOUNTER — Telehealth (INDEPENDENT_AMBULATORY_CARE_PROVIDER_SITE_OTHER): Payer: Self-pay | Admitting: *Deleted

## 2023-05-19 NOTE — Telephone Encounter (Signed)
Patient received paperwork to fill out for colonoscopy and she states she wants to wait til end of year due to having a lot of medical cost with her husband's cancer treatment. She wanted Dr. Levon Hedger to know its not that she does not want to do just cannot afford to at this time. Pt aware to call back when ready to scheduled.

## 2023-05-23 NOTE — Telephone Encounter (Signed)
I understand, okay to wait until the end of the year to perform the colonoscopy given financial constraints

## 2023-05-24 ENCOUNTER — Ambulatory Visit: Payer: PPO | Admitting: Orthopaedic Surgery

## 2023-05-24 ENCOUNTER — Encounter: Payer: Self-pay | Admitting: Orthopaedic Surgery

## 2023-05-24 VITALS — BP 125/69 | HR 80 | Ht 67.0 in | Wt 217.0 lb

## 2023-05-24 DIAGNOSIS — M7062 Trochanteric bursitis, left hip: Secondary | ICD-10-CM

## 2023-05-24 DIAGNOSIS — M7061 Trochanteric bursitis, right hip: Secondary | ICD-10-CM | POA: Diagnosis not present

## 2023-05-24 MED ORDER — PREDNISONE 5 MG PO TABS: ORAL_TABLET | ORAL | 0 refills | Status: DC

## 2023-05-24 MED ORDER — HYDROCODONE-ACETAMINOPHEN 5-325 MG PO TABS: 1.0000 | ORAL_TABLET | Freq: Four times a day (QID) | ORAL | 0 refills | Status: DC | PRN

## 2023-05-24 NOTE — Progress Notes (Signed)
My hips hurt now and then.  She had to take a prednisone dose pack recently because of a flare up of bursitis of the hips.  She has no new trauma.  She is on an anti-coagulant and cannot take NSAIDs.  She has no redness, no numbness.  Both hips are tender at the bursa area but she has full ROM and normal gait today.  NV intact.  Encounter Diagnoses  Name Primary?   Trochanteric bursitis, left hip Yes   Trochanteric bursitis, right hip    I have reviewed the [facility] Controlled Substance Reporting System web site prior to prescribing narcotic medicine for this patient.  I will refill prednisone for her.  Return in six weeks.  Call if any problem.  Precautions discussed.  Electronically Signed Darreld Mclean, MD 6/11/20249:46 AM

## 2023-05-24 NOTE — Telephone Encounter (Signed)
Pt.notified

## 2023-05-28 DIAGNOSIS — J069 Acute upper respiratory infection, unspecified: Secondary | ICD-10-CM | POA: Diagnosis not present

## 2023-05-30 ENCOUNTER — Telehealth (INDEPENDENT_AMBULATORY_CARE_PROVIDER_SITE_OTHER): Payer: Self-pay | Admitting: *Deleted

## 2023-05-30 ENCOUNTER — Other Ambulatory Visit (INDEPENDENT_AMBULATORY_CARE_PROVIDER_SITE_OTHER): Payer: Self-pay | Admitting: *Deleted

## 2023-05-30 DIAGNOSIS — R197 Diarrhea, unspecified: Secondary | ICD-10-CM

## 2023-05-30 DIAGNOSIS — R109 Unspecified abdominal pain: Secondary | ICD-10-CM

## 2023-05-30 DIAGNOSIS — Z8619 Personal history of other infectious and parasitic diseases: Secondary | ICD-10-CM

## 2023-05-30 DIAGNOSIS — A09 Infectious gastroenteritis and colitis, unspecified: Secondary | ICD-10-CM

## 2023-05-30 NOTE — Telephone Encounter (Signed)
Pt left vm that she thinks she has c diff again. She is taking augmentin 875mg  for URI.  I tried to call her to get more information and had to leave a message to return call   [phone]

## 2023-05-30 NOTE — Telephone Encounter (Signed)
Started amoxicillin 875 on Saturday 6/17 for URI. She noticed an smell today when she had BM like she had when she had c diff and stool was runny. She has a container for [facility] test. She is requesting a lab order. She said she had cdiff for a year last time she had it.  Last seen 2/8 for crohns

## 2023-05-30 NOTE — Telephone Encounter (Signed)
Per chelsea -  Lets check C diff and GI pathogen panel, I will order these for [facility] if she is ok with that  Pt was notified and [facility]. States she will go tomorrow to lab.

## 2023-06-06 ENCOUNTER — Telehealth (INDEPENDENT_AMBULATORY_CARE_PROVIDER_SITE_OTHER): Payer: Self-pay | Admitting: *Deleted

## 2023-06-06 NOTE — Telephone Encounter (Signed)
Pt left vm that after stopping antibiotic her stomach was better. She [facility] and she said they would not take it because it was not frozen after she sat in lab for one hour with no air conditioning. She wants to use [facility] in the future. She states she did not repeat sample because her stool is back to normal after stopping antibiotic. I advised her to call us back if any symptoms returned or if she had concerns. She verbalized understanding.

## 2023-06-20 ENCOUNTER — Telehealth: Payer: Self-pay | Admitting: Orthopaedic Surgery

## 2023-06-20 NOTE — Telephone Encounter (Signed)
Dr. Lindajo Royal pt - pt is requesting a refill on Hydrocodone 5-325 to be sent to [facility].

## 2023-06-21 MED ORDER — HYDROCODONE-ACETAMINOPHEN 5-325 MG PO TABS: 1.0000 | ORAL_TABLET | Freq: Four times a day (QID) | ORAL | 0 refills | Status: DC | PRN

## 2023-07-11 ENCOUNTER — Telehealth (INDEPENDENT_AMBULATORY_CARE_PROVIDER_SITE_OTHER): Payer: Self-pay | Admitting: *Deleted

## 2023-07-11 ENCOUNTER — Telehealth: Payer: Self-pay | Admitting: Family Medicine

## 2023-07-11 NOTE — Telephone Encounter (Signed)
Yeah, she should be able to see [place] here - can see an APP for this. She should keep the appointment with dr. Lovell Sheehan. Thanks

## 2023-07-11 NOTE — Telephone Encounter (Signed)
Pt called back. She called DR. Lovell Sheehan and cannot be seen til sept 3rd and MD cannot see her for 2 weeks. She states she needs some relief. Is this something I could see if an app at [facility] could see her for or should she go to ED?

## 2023-07-11 NOTE — Telephone Encounter (Signed)
Patient called and states that she needs to see Dr. Lovell Sheehan that her perianal cyst has returned. She called her GI and they recommended she call us to see if she could be seen. Dr. Lovell Sheehan is out of town and we do not have a doctor in office today. She states that it is very swollen and painful. She denies any fever. I recommended that she call her PCP and she has already called them and they are to call her back. She only wanted to see Dr. Garner Nash but I suggested she see whomever they could get her in with today. If they could not see her then she needed to go to the ER or UC. Patient verbalized understanding.

## 2023-07-11 NOTE — Telephone Encounter (Signed)
Patient states cyst on buttock came back on Saturday. She had some "left over" flagy 500mg  and cipro 500mg  that she started taking on Saturday. She states the cyst was the size of a softball and was bleeding. States it went down in size some. Still bleeding. States she has no fever. She took flagyl three times yesterday and cipro twice yesterday and does not have any for today. States this med helped last time. She states surgeon Dr. Lovell Sheehan told her it may come back. She did not know if she could get meds called in or if she needs to call Dr. Lovell Sheehan.   [phone]

## 2023-07-11 NOTE — Telephone Encounter (Signed)
Pt aware of appt tomorrow. 

## 2023-07-11 NOTE — Telephone Encounter (Signed)
Please let the patient know perianal cysts are not typically managed with antibiotics, unless they are superinfected. Would not recommend antibiotics for now. Seems she is having mostly bleeding from it. Would recommend reaching Dr. Lovell Sheehan office to have it checked again, as she may need a repeat surgical intervention.

## 2023-07-11 NOTE — Telephone Encounter (Signed)
Discussed with patient per Dr. Levon Hedger -  Please let the patient know perianal cysts are not typically managed with antibiotics, unless they are superinfected. Would not recommend antibiotics for now. Seems she is having mostly bleeding from it. Would recommend reaching Dr. Lovell Sheehan office to have it checked again, as she may need a repeat surgical intervention.      Patient verbalized understanding and states she will call Dr. Lovell Sheehan.

## 2023-07-11 NOTE — Telephone Encounter (Signed)
Pt would like to be seen tomorrow if possible any provider is ok. Please let me know if there is an opening for her.

## 2023-07-12 ENCOUNTER — Ambulatory Visit: Payer: PPO | Admitting: Gastroenterology

## 2023-07-12 ENCOUNTER — Encounter: Payer: Self-pay | Admitting: Gastroenterology

## 2023-07-12 VITALS — BP 140/70 | HR 101 | Temp 97.9°F | Ht 67.0 in | Wt 216.0 lb

## 2023-07-12 DIAGNOSIS — K5080 Crohn's disease of both small and large intestine without complications: Secondary | ICD-10-CM

## 2023-07-12 MED ORDER — METRONIDAZOLE 500 MG PO TABS: 500.0000 mg | ORAL_TABLET | Freq: Three times a day (TID) | ORAL | 0 refills | Status: AC

## 2023-07-12 MED ORDER — CIPROFLOXACIN HCL 500 MG PO TABS: 500.0000 mg | ORAL_TABLET | Freq: Two times a day (BID) | ORAL | 0 refills | Status: AC

## 2023-07-12 NOTE — Patient Instructions (Signed)
Please have labs and stool test done at [facility].  I have sent in Cipro and Flagyl to complete an additional few days.   We are arranging a colonoscopy in the near future! We are requesting to hold Xarelto for 48 hours prior.   Please call if worsening of symptoms!  We will see you in 3 months!  It was a pleasure to see you today. I want to create trusting relationships with patients and provide genuine, compassionate, and quality care. I truly value your feedback, so please be on the lookout for a survey regarding your visit with me today. I appreciate your time in completing this!         Gelene Mink, PhD, ANP-BC [facility]

## 2023-07-12 NOTE — Progress Notes (Addendum)
Gastroenterology Office Note     Primary Care Physician:  Richardean Chimera, MD  Primary Gastroenterologist: Dr. Levon Hedger    Chief Complaint   Chief Complaint  Patient presents with   Abscess    Abscess on buttock. Came up 3 days ago. Started taking flagyl and cipro and took last of med today. Would like to get colonoscopy scheduled.      History of Present Illness   Elizabeth [name] is a 62 y.o. female presenting today with a past medical history of  ileocolonic Crohn's disease status post ileocecectomy and partial colectomy, DVT on Xarelto, recurrent C. Diff, hypertension, GERD, obesity.   Prior management included Humira weekly, 6-MP, currently on Skyrizi since August 2023.   BM 1-2 times per day, soft/formed. No diarrhea. +smoking. 1/2 pack over 4-5 days. Area on buttock came up 3 days ago. History of perianal abscess s/p I&D Nov 2023. Took leftover Cipro and Flagyl. Took 3 days of antibiotics. No fever or chills. Abscess bleeding yesterday. No purulence. Feels improved. She is adamant she needs additional abx.    Last Colonoscopy:12/22/22- Hemorrhoids found on perianal exam. - The examined portion of the ileum was normal. - Non critical stenosis at the ileal surgical anastomosis. - Inflammation was found., improved compared to previous examinations. Chromoscopy performed. - One 20 mm polyp in the descending colon-Injected. Clips were placed. Clip manufacturer: area distal was tattooed. - Patent end-to-side colo-colonic anastomosis,  characterized by healthy appearing mucosa. - Three 3 to 6 mm polyps in the rectum - One 1 mm polyp in the rectum - The distal rectum and anal verge are normal on  retroflexion view. (1 SSP and 2 TAs)    Recommendations:  Repeat colonoscopy July 2024   Past Medical History:  Diagnosis Date   Arthritis    Bronchitis    C. difficile diarrhea    Cancer (HCC)    Cough 01/28/2014   upper airway cough syndrome   Crohn's disease (HCC)     Diverticulitis    DVT 06/2020   history of   Elevated transaminase level    GERD (gastroesophageal reflux disease)    Hypertension    Obesity    Pneumonia 12/2013   Tobacco abuse     Past Surgical History:  Procedure Laterality Date   ABDOMINAL HYSTERECTOMY  12/13/1978   APPENDECTOMY     BIOPSY  11/11/2021   Procedure: BIOPSY;  Surgeon: Malissa Hippo, MD;  [facility];  Service: Endoscopy;;  bx of polyp and small bowel   COLON SURGERY     COLONOSCOPY     COLONOSCOPY N/A 08/16/2014   Procedure: COLONOSCOPY;  Surgeon: Malissa Hippo, MD;  [facility];  Service: Endoscopy;  Laterality: N/A;  1200-rescheduled to 9/4 @ 10:45 Ann notified pt   COLONOSCOPY WITH PROPOFOL N/A 11/11/2021   Procedure: COLONOSCOPY WITH PROPOFOL;  Surgeon: Malissa Hippo, MD;  [facility];  Service: Endoscopy;  Laterality: N/A;  2:05   COLONOSCOPY WITH PROPOFOL N/A 12/22/2022   Procedure: COLONOSCOPY WITH PROPOFOL;  Surgeon: Dolores Frame, MD;  [facility];  Service: Gastroenterology;  Laterality: N/A;  11:15am, asa 1-2   ESOPHAGOGASTRODUODENOSCOPY  06/16/2012   Procedure: ESOPHAGOGASTRODUODENOSCOPY (EGD);  Surgeon: Malissa Hippo, MD;  [facility];  Service: Endoscopy;  Laterality: N/A;  10:30 AM   hemocolectomy  12/14/2003   right   HEMOSTASIS CLIP PLACEMENT  12/22/2022   Procedure: HEMOSTASIS  CLIP PLACEMENT;  Surgeon: Marguerita Merles, Reuel Boom, MD;  [facility];  Service: Gastroenterology;;   Illeocecal Resection  2005   INCISION AND DRAINAGE ABSCESS N/A 11/10/2022   Procedure: INCISION AND DRAINAGE ABSCESS, PERIANAL;  Surgeon: Franky Macho, MD;  [facility]: [facility];  Service: General;  Laterality: N/A;   INCISIONAL HERNIA REPAIR N/A 08/24/2021   Procedure: HERNIA REPAIR INCISIONAL WITH MESH;  Surgeon: Franky Macho, MD;  [facility]: [facility];  Service: General;  Laterality: N/A;   LAPAROSCOPIC SIGMOID COLECTOMY N/A  01/07/2015   Procedure: LOW ANTERIOR COLON RESECTION;  Surgeon: Claud Kelp, MD;  Location: [facility];  Service: General;  Laterality: N/A;   POLYPECTOMY  12/22/2022   Procedure: POLYPECTOMY;  Surgeon: Dolores Frame, MD;  [facility];  Service: Gastroenterology;;   SHOULDER ARTHROSCOPY WITH LABRAL REPAIR Right 01/20/2016   Procedure: SHOULDER ARTHROSCOPY WITH LABRAL REPAIR;  Surgeon: Cammy Copa, MD;  Location: [facility];  Service: Orthopedics;  Laterality: Right;   SUBMUCOSAL LIFTING INJECTION  12/22/2022   Procedure: SUBMUCOSAL LIFTING INJECTION;  Surgeon: Dolores Frame, MD;  [facility];  Service: Gastroenterology;;   SUBMUCOSAL TATTOO INJECTION  12/22/2022   Procedure: SUBMUCOSAL TATTOO INJECTION;  Surgeon: Marguerita Merles, Reuel Boom, MD;  [facility];  Service: Gastroenterology;;   TONGUE SURGERY Left 2018   cancer removed    Current Outpatient Medications  Medication Sig Dispense Refill   albuterol (VENTOLIN HFA) 108 (90 Base) MCG/ACT inhaler Inhale 2 puffs into the lungs every 6 (six) hours as needed for wheezing or shortness of breath.     Ascorbic Acid (VITAMIN C) 1000 MG tablet Take 1,000 mg by mouth every evening.     Cholecalciferol (VITAMIN D3) 50 MCG (2000 UT) TABS Take 2,000 Units by mouth every evening.     ciprofloxacin (CIPRO) 500 MG tablet Take 1 tablet (500 mg total) by mouth 2 (two) times daily for 3 days. 6 tablet 0   HYDROcodone-acetaminophen (NORCO/VICODIN) 5-325 MG tablet Take 1-2 tablets by mouth every 6 (six) hours as needed for moderate pain. 30 tablet 0   metroNIDAZOLE (FLAGYL) 500 MG tablet Take 1 tablet (500 mg total) by mouth 3 (three) times daily for 3 days. 9 tablet 0   omeprazole (PRILOSEC) 40 MG capsule Take 40 mg by mouth daily before breakfast.     potassium chloride (MICRO-K) 10 MEQ CR capsule Take 10 mEq by mouth 2 (two) times daily.      promethazine (PHENERGAN) 25 MG tablet TAKE 1 TABLET BY  MOUTH TWICE A DAY AS NEEDED 20 tablet 0   Risankizumab-rzaa (SKYRIZI) 360 MG/2.4ML SOCT Inject 360 mg into the skin every 8 (eight) weeks. 2.4 mL 7   rivaroxaban (XARELTO) 20 MG TABS tablet Take 1 tablet (20 mg total) by mouth daily with supper. 30 tablet    valsartan-hydrochlorothiazide (DIOVAN-HCT) 160-12.5 MG per tablet Take 1 tablet by mouth in the morning.     zinc gluconate 50 MG tablet Take 50 mg by mouth daily.     No current facility-administered medications for this visit.    Allergies as of 07/12/2023 - Review Complete 07/12/2023  Allergen Reaction Noted   Contrast media [iodinated contrast media] Hives and Rash 01/10/2012   Penicillins Hives and Rash 01/10/2012    Family History  Problem Relation Age of Onset   Healthy Mother    Lung cancer Father        smoked   Allergic Disorder Brother    Healthy Daughter  Social History   Socioeconomic History   Marital status: Married    Spouse name: Not on file   Number of children: Not on file   Years of education: Not on file   Highest education level: Not on file  Occupational History   Occupation: Wound Treatment Nurse    Employer: [employer]  Tobacco Use   Smoking status: Every Day    Current packs/day: 0.25    Average packs/day: 0.3 packs/day for 40.0 years (10.0 ttl pk-yrs)    Types: Cigarettes    Passive exposure: Current   Smokeless tobacco: Never   Tobacco comments:    increase smoking , very nervous  Vaping Use   Vaping status: Never Used  Substance and Sexual Activity   Alcohol use: Not Currently    Alcohol/week: 0.0 standard drinks of alcohol    Comment: occ   Drug use: No   Sexual activity: Yes  Other Topics Concern   Not on file  Social History Narrative   Not on file   Social Determinants of Health   Financial Resource Strain: Low Risk  (06/06/2020)   Received from [facility]   Overall Financial Resource Strain (CARDIA)    Difficulty of Paying Living Expenses: Not hard at all   Food Insecurity: No Food Insecurity (06/06/2020)   [facility]   Hunger Vital Sign    Worried About Running Out of Food in the Last Year: Never true    Ran Out of Food in the Last Year: Never true  Transportation Needs: No Transportation Needs (06/06/2020)   Received from [facility]   PRAPARE - Transportation    Lack of Transportation (Medical): No    Lack of Transportation (Non-Medical): No  Physical Activity: Unknown (06/06/2020)   Received from [facility]   Exercise Vital Sign    Days of Exercise per Week: Not on file    Minutes of Exercise per Session: 0 min  Stress: No Stress Concern Present (06/06/2020)   Received from [facility] - Occupational Stress Questionnaire    Feeling of Stress : Not at all  Social Connections: Socially Integrated (06/06/2020)   Received from [facility]   Social Connection and Isolation Panel [NHANES]    Frequency of Communication with Friends and Family: More than three times a week    Frequency of Social Gatherings with Friends and Family: More than three times a week    Attends Religious Services: More than 4 times per year    Active Member of [employer] or Organizations: Yes    Attends Engineer, structural: More than 4 times per year    Marital Status: Married  Catering manager Violence: Not At Risk (06/06/2020)   Received from [facility]   Humiliation, Afraid, Rape, and Kick questionnaire    Fear of Current or Ex-Partner: No    Emotionally Abused: No    Physically Abused: No    Sexually Abused: No     Review of Systems   Gen: Denies any fever, chills, fatigue, weight loss, lack of appetite.  CV: Denies chest pain, heart palpitations, peripheral edema, syncope.  Resp: Denies shortness of breath at rest or with exertion. Denies wheezing or cough.  GI: Denies dysphagia or odynophagia. Denies jaundice, hematemesis, fecal incontinence. GU : Denies urinary  burning, urinary frequency, urinary hesitancy MS: Denies joint pain, muscle weakness, cramps, or limitation of movement.  Derm: Denies rash,  itching, dry skin Psych: Denies depression, anxiety, memory loss, and confusion Heme: Denies bruising, bleeding, and enlarged lymph nodes.   Physical Exam   BP (!) 140/70 (BP Location: Left Arm, Patient Position: Sitting, Cuff Size: Large)   Pulse (!) 101   Temp 97.9 F (36.6 C) (Oral)   Ht 5\' 7"  (1.702 m)   Wt 216 lb (98 kg)   BMI 33.83 kg/m  General:   Alert and oriented. Pleasant and cooperative. Well-nourished and well-developed.  Head:  Normocephalic and atraumatic. Eyes:  Without icterus Abdomen:  +BS, soft, non-tender and non-distended. No HSM noted. No guarding or rebound. No masses appreciated.  Rectal:  Declined internal. No obvious erythnmea, induration. Had firmess right buttock  Msk:  Symmetrical without gross deformities. Normal posture. Extremities:  Without edema. Neurologic:  Alert and  oriented x4;  grossly normal neurologically. Skin:  Intact without significant lesions or rashes. Psych:  Alert and cooperative. Normal mood and affect.   Assessment   KERRIN FRANEY is a 62 y.o. female presenting today with a past medical history of  ileocolonic Crohn's disease status post ileocecectomy and partial colectomy, DVT on Xarelto, recurrent C. Diff, hypertension, GERD, obesity.   She has previously failed 6-MP, Humira weekly, and now on Skyrizi since August 2023. Clinically, she has been doing quite well although continues to smoke. Now noting concerns for perianal abscess, which she has dealt with previously and required I&D Nov 2023. She has already taken leftover Cipro and Flagyl (approximately 3 days). Rectal exam without concerning findings, and I feel she could be managed without antibiotics. However, she is adamant that she complete a course. I told her I would extend for an additional few days but if any worsening, she would  need to see Dr. Lovell Sheehan sooner.   She is due for colonoscopy now as last was in Jan 2024 with large polyp s/p clip placement and would like to pursue this.     PLAN    Complete brief course of Cipro and Flagyl Imaging if no improvement Plans for colonoscopy in near future: requesting to hold Xarelto X 48 hours prior CRP, sed rate, fecal cal, CBC, CMP Continue Sykrizi 3 month return    Gelene Mink, PhD, ANP-BC [facility]   I have reviewed the note and agree with the APP's assessment as described in this progress note  Katrinka Blazing, MD Gastroenterology and Hepatology [facility]

## 2023-07-25 ENCOUNTER — Ambulatory Visit (INDEPENDENT_AMBULATORY_CARE_PROVIDER_SITE_OTHER): Payer: PPO | Admitting: Gastroenterology

## 2023-07-25 DIAGNOSIS — K50818 Crohn's disease of both small and large intestine with other complication: Secondary | ICD-10-CM | POA: Diagnosis not present

## 2023-07-27 ENCOUNTER — Telehealth: Payer: Self-pay | Admitting: Orthopaedic Surgery

## 2023-07-27 MED ORDER — PREDNISONE 10 MG (21) PO TBPK: ORAL_TABLET | ORAL | 0 refills | Status: DC

## 2023-07-27 MED ORDER — HYDROCODONE-ACETAMINOPHEN 5-325 MG PO TABS: 1.0000 | ORAL_TABLET | Freq: Four times a day (QID) | ORAL | 0 refills | Status: DC | PRN

## 2023-07-27 NOTE — Telephone Encounter (Signed)
Dr. Sanjuan Dame pt - spoke w/the patient, she is requesting prednisone and Hydrocodone 5-325 to be sent to [facility]

## 2023-07-28 ENCOUNTER — Telehealth (INDEPENDENT_AMBULATORY_CARE_PROVIDER_SITE_OTHER): Payer: Self-pay | Admitting: *Deleted

## 2023-07-28 NOTE — Telephone Encounter (Signed)
Pt wanted to know if there was anything she could take for arthritis pain. States Dr. Karilyn Cota told her she could not take arthritis med and tylenol arthritis does not help. She has to take a prednisone taper every 1 -2 months. 60, 50, 40, 30, 20 and 10mg  is what she is currently taking. She does not want to have to keep taking prednisone taper if she can help it. States she has osteo arthritis and bursitis in hip.   [phone]

## 2023-07-29 NOTE — Telephone Encounter (Signed)
Discussed with patient per Dr. Levon Hedger - Unfortunately, for osteoarthritis and bursitis, the medications that may help with this are NSAIDs. However, these medicines may lead to flares of his Crohn's disease, so I would discourage it. She should discuss with PCP other therapies such as physical therapy, beside of Tylenol.  Pt verbalized understanding.

## 2023-08-01 ENCOUNTER — Other Ambulatory Visit (INDEPENDENT_AMBULATORY_CARE_PROVIDER_SITE_OTHER): Payer: Self-pay | Admitting: Gastroenterology

## 2023-08-01 ENCOUNTER — Telehealth: Payer: Self-pay | Admitting: *Deleted

## 2023-08-01 DIAGNOSIS — K610 Anal abscess: Secondary | ICD-10-CM

## 2023-08-01 MED ORDER — CIPROFLOXACIN HCL 500 MG PO TABS: 500.0000 mg | ORAL_TABLET | Freq: Two times a day (BID) | ORAL | 0 refills | Status: AC

## 2023-08-01 MED ORDER — METRONIDAZOLE 500 MG PO TABS: 500.0000 mg | ORAL_TABLET | Freq: Three times a day (TID) | ORAL | 0 refills | Status: AC

## 2023-08-01 NOTE — Telephone Encounter (Signed)
Received call from patient [phone]~ telephone.   Patient reports that she is currently scheduled for appointment for perianal abscess/ cyst on 08/16/2023. Requested earlier appointment if available. Unable to offer earlier appointment.   States that she did take oral ABTx in late July for recurrence. Reports that area improved after taking ABTx, but has recurred once again.   Advised to use warm compresses and sitz baths. Advised to contact PCP or go to UC/ER if area worsens.

## 2023-08-16 ENCOUNTER — Ambulatory Visit: Payer: PPO | Admitting: General Surgery

## 2023-08-16 ENCOUNTER — Encounter: Payer: Self-pay | Admitting: General Surgery

## 2023-08-16 VITALS — BP 103/72 | HR 102 | Temp 97.5°F | Resp 14 | Ht 67.0 in | Wt 220.0 lb

## 2023-08-16 DIAGNOSIS — K610 Anal abscess: Secondary | ICD-10-CM

## 2023-08-17 ENCOUNTER — Telehealth: Payer: Self-pay | Admitting: Orthopaedic Surgery

## 2023-08-17 NOTE — Progress Notes (Signed)
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  Patient has a longstanding history of Crohn's disease.  She is status post incision and drainage of a perirectal abscess in November 2023.  She states that several weeks ago, she developed pain and swelling in the rectal region and did have some bloody drainage.  She took some antibiotics that she had and it did resolve.  She currently has no drainage.  She wanted me to evaluate her perineal region. Past Medical History:  Diagnosis Date   Arthritis    Bronchitis    C. difficile diarrhea    Cancer (HCC)    Cough 01/28/2014   upper airway cough syndrome   Crohn's disease (HCC)    Diverticulitis    DVT 06/2020   history of   Elevated transaminase level    GERD (gastroesophageal reflux disease)    Hypertension    Obesity    Pneumonia 12/2013   Tobacco abuse     Past Surgical History:  Procedure Laterality Date   ABDOMINAL HYSTERECTOMY  12/13/1978   APPENDECTOMY     BIOPSY  11/11/2021   Procedure: BIOPSY;  Surgeon: Malissa Hippo, MD;  [facility];  Service: Endoscopy;;  bx of polyp and small bowel   COLON SURGERY     COLONOSCOPY     COLONOSCOPY N/A 08/16/2014   Procedure: COLONOSCOPY;  Surgeon: Malissa Hippo, MD;  [facility];  Service: Endoscopy;  Laterality: N/A;  1200-rescheduled to 9/4 @ 10:45 Ann notified pt   COLONOSCOPY WITH PROPOFOL N/A 11/11/2021   Procedure: COLONOSCOPY WITH PROPOFOL;  Surgeon: Malissa Hippo, MD;  [facility];  Service: Endoscopy;  Laterality: N/A;  2:05   COLONOSCOPY WITH PROPOFOL N/A 12/22/2022   Procedure: COLONOSCOPY WITH PROPOFOL;  Surgeon: Dolores Frame, MD;  [facility];  Service: Gastroenterology;  Laterality: N/A;  11:15am, asa 1-2   ESOPHAGOGASTRODUODENOSCOPY  06/16/2012   Procedure: ESOPHAGOGASTRODUODENOSCOPY  (EGD);  Surgeon: Malissa Hippo, MD;  [facility];  Service: Endoscopy;  Laterality: N/A;  10:30 AM   hemocolectomy  12/14/2003   right   HEMOSTASIS CLIP PLACEMENT  12/22/2022   Procedure: HEMOSTASIS CLIP PLACEMENT;  Surgeon: Dolores Frame, MD;  [facility];  Service: Gastroenterology;;   Illeocecal Resection  2005   INCISION AND DRAINAGE ABSCESS N/A 11/10/2022   Procedure: INCISION AND DRAINAGE ABSCESS, PERIANAL;  Surgeon: Franky Macho, MD;  [facility];  Service: General;  Laterality: N/A;   INCISIONAL HERNIA REPAIR N/A 08/24/2021   Procedure: HERNIA REPAIR INCISIONAL WITH MESH;  Surgeon: Franky Macho, MD;  Location: [facility];  Service: General;  Laterality: N/A;   LAPAROSCOPIC SIGMOID COLECTOMY N/A 01/07/2015   Procedure: LOW ANTERIOR COLON RESECTION;  Surgeon: Claud Kelp, MD;  Location: [facility] OR;  Service: General;  Laterality: N/A;   POLYPECTOMY  12/22/2022   Procedure: POLYPECTOMY;  Surgeon: Dolores Frame, MD;  [facility];  Service: Gastroenterology;;   SHOULDER ARTHROSCOPY WITH LABRAL REPAIR Right 01/20/2016   Procedure: SHOULDER ARTHROSCOPY WITH LABRAL REPAIR;  Surgeon: Cammy Copa, MD;  [facility]: [facility];  Service: Orthopedics;  Laterality: Right;   SUBMUCOSAL LIFTING INJECTION  12/22/2022   Procedure: SUBMUCOSAL LIFTING INJECTION;  Surgeon: Dolores Frame, MD;  [facility];  Service: Gastroenterology;;   SUBMUCOSAL TATTOO INJECTION  12/22/2022  Procedure: SUBMUCOSAL TATTOO INJECTION;  Surgeon: Marguerita Merles, Reuel Boom, MD;  [facility];  Service: Gastroenterology;;   TONGUE SURGERY Left 2018   cancer removed    Family History  Problem Relation Age of Onset   Healthy Mother    Lung cancer Father        smoked   Allergic Disorder Brother    Healthy Daughter     Current Outpatient Medications on File Prior to Visit  Medication Sig Dispense Refill   albuterol (VENTOLIN  HFA) 108 (90 Base) MCG/ACT inhaler Inhale 2 puffs into the lungs every 6 (six) hours as needed for wheezing or shortness of breath.     Ascorbic Acid (VITAMIN C) 1000 MG tablet Take 1,000 mg by mouth every evening.     Cholecalciferol (VITAMIN D3) 50 MCG (2000 UT) TABS Take 2,000 Units by mouth every evening.     HYDROcodone-acetaminophen (NORCO/VICODIN) 5-325 MG tablet Take 1-2 tablets by mouth every 6 (six) hours as needed for moderate pain. 30 tablet 0   omeprazole (PRILOSEC) 40 MG capsule Take 40 mg by mouth daily before breakfast.     potassium chloride (MICRO-K) 10 MEQ CR capsule Take 10 mEq by mouth 2 (two) times daily.      promethazine (PHENERGAN) 25 MG tablet TAKE 1 TABLET BY MOUTH TWICE A DAY AS NEEDED 20 tablet 0   rivaroxaban (XARELTO) 20 MG TABS tablet Take 1 tablet (20 mg total) by mouth daily with supper. 30 tablet    valsartan-hydrochlorothiazide (DIOVAN-HCT) 160-12.5 MG per tablet Take 1 tablet by mouth in the morning.     zinc gluconate 50 MG tablet Take 50 mg by mouth daily.     predniSONE (STERAPRED UNI-PAK 21 TAB) 10 MG (21) TBPK tablet Follow directions on dose pack / 6 day taper (Patient not taking: Reported on 08/16/2023) 21 tablet 0   No current facility-administered medications on file prior to visit.    Allergies  Allergen Reactions   Contrast Media [Iodinated Contrast Media] Hives and Rash   Penicillins Hives and Rash             Social History   Substance and Sexual Activity  Alcohol Use Not Currently   Alcohol/week: 0.0 standard drinks of alcohol   Comment: occ    Social History   Tobacco Use  Smoking Status Every Day   Current packs/day: 0.25   Average packs/day: 0.3 packs/day for 40.0 years (10.0 ttl pk-yrs)   Types: Cigarettes   Passive exposure: Current  Smokeless Tobacco Never  Tobacco Comments   increase smoking , very nervous    Review of Systems  Constitutional: Negative.   HENT: Negative.    Eyes: Negative.   Respiratory:  Negative.    Cardiovascular: Negative.   Gastrointestinal: Negative.   Genitourinary: Negative.   Musculoskeletal: Negative.   Skin: Negative.   Neurological: Negative.   Endo/Heme/Allergies: Negative.   Psychiatric/Behavioral: Negative.      Objective   Vitals:   08/16/23 1320  BP: 103/72  Pulse: (!) 102  Resp: 14  Temp: (!) 97.5 F (36.4 C)  SpO2: 95%    Physical Exam Vitals reviewed.  Constitutional:      Appearance: Normal appearance. She is not ill-appearing.  HENT:     Head: Normocephalic and atraumatic.  Cardiovascular:     Rate and Rhythm: Normal rate and regular rhythm.     Heart sounds: Normal heart sounds. No murmur heard.    No friction rub. No gallop.  Pulmonary:  Effort: Pulmonary effort is normal. No respiratory distress.     Breath sounds: Normal breath sounds. No stridor. No wheezing, rhonchi or rales.  Genitourinary:    Comments: Rectal examination was unremarkable.  She had normal sphincter tone.  No induration or fistula was seen or palpated.  No perirectal abscess was present. Skin:    General: Skin is warm and dry.  Neurological:     Mental Status: She is alert and oriented to person, place, and time.     Assessment  Perirectal abscess, resolved.  No fistulas present. Plan  I told the patient that I could not appreciate anything on examination to require surgical intervention.  I told her to return to my care should anything change.  She understands and agrees.  Follow-up here as needed.

## 2023-08-17 NOTE — Telephone Encounter (Signed)
Dr. Sanjuan Dame pt - pt lvm requesting Hydrocodone 10mg  be called in because her pharmacy doesn't have the 5mg 

## 2023-08-23 ENCOUNTER — Ambulatory Visit: Payer: PPO | Admitting: Orthopaedic Surgery

## 2023-08-23 ENCOUNTER — Encounter: Payer: Self-pay | Admitting: Orthopaedic Surgery

## 2023-08-23 DIAGNOSIS — F17210 Nicotine dependence, cigarettes, uncomplicated: Secondary | ICD-10-CM

## 2023-08-23 DIAGNOSIS — M7062 Trochanteric bursitis, left hip: Secondary | ICD-10-CM

## 2023-08-23 DIAGNOSIS — M7061 Trochanteric bursitis, right hip: Secondary | ICD-10-CM

## 2023-08-23 MED ORDER — PREDNISONE 10 MG (21) PO TBPK: ORAL_TABLET | ORAL | 1 refills | Status: DC

## 2023-08-23 MED ORDER — HYDROCODONE-ACETAMINOPHEN 5-325 MG PO TABS: 1.0000 | ORAL_TABLET | Freq: Four times a day (QID) | ORAL | 0 refills | Status: DC | PRN

## 2023-08-23 NOTE — Progress Notes (Signed)
My hips are hurting bad again.  She has recurrent bursitis of the hips.  She cannot take NSAIDs.  Her drug store does not have the medicine.  She will change drug stores.    She has bilateral trochanteric pain of the hips.  ROM is full.  NV Intact.  Encounter Diagnoses  Name Primary?   Trochanteric bursitis, left hip Yes   Trochanteric bursitis, right hip    Nicotine dependence, cigarettes, uncomplicated    I have reviewed the [place] Controlled Substance Reporting System web site prior to prescribing narcotic medicine for this patient.  Return in two weeks.  I will call in prednisone also.  Call if any problem.  Precautions discussed.  Electronically Signed Darreld Mclean, MD 9/10/20249:15 AM

## 2023-09-06 ENCOUNTER — Ambulatory Visit: Payer: PPO | Admitting: General Surgery

## 2023-09-06 ENCOUNTER — Ambulatory Visit: Payer: PPO | Admitting: Orthopaedic Surgery

## 2023-09-07 ENCOUNTER — Encounter: Payer: Self-pay | Admitting: Orthopaedic Surgery

## 2023-09-07 ENCOUNTER — Ambulatory Visit: Payer: PPO | Admitting: Orthopaedic Surgery

## 2023-09-07 VITALS — BP 108/70 | HR 109 | Ht 67.0 in | Wt 213.0 lb

## 2023-09-07 DIAGNOSIS — M7061 Trochanteric bursitis, right hip: Secondary | ICD-10-CM | POA: Diagnosis not present

## 2023-09-07 DIAGNOSIS — M7062 Trochanteric bursitis, left hip: Secondary | ICD-10-CM

## 2023-09-07 DIAGNOSIS — F17210 Nicotine dependence, cigarettes, uncomplicated: Secondary | ICD-10-CM

## 2023-09-07 MED ORDER — HYDROCODONE-ACETAMINOPHEN 5-325 MG PO TABS: 1.0000 | ORAL_TABLET | Freq: Four times a day (QID) | ORAL | 0 refills | Status: DC | PRN

## 2023-09-07 NOTE — Patient Instructions (Signed)
You may be a candidate for shock wave therapy at our [facility] Dr Shon Baton does these, insurance does not cover, it is $60 per treatment and you often need multiple treatments   We have sent referral and his office will call you [phone]

## 2023-09-07 NOTE — Progress Notes (Signed)
My hips are not as painful  She has less pain in both trochanteric areas.  She is walking better.  She has had more good than bad days.  She did not take the prednisone as she is trying to lose weight.  I have discussed with her the "shock wave" treatment to the hips as she has this chronically and has had injections, heat, ice, PT, medications and still has the problem.  I will set up referral for this.  Both hips are tender today laterally but have full ROM.  NV intact. Gait is good.  No redness.  Encounter Diagnoses  Name Primary?   Trochanteric bursitis, left hip Yes   Trochanteric bursitis, right hip    Nicotine dependence, cigarettes, uncomplicated    To see in six weeks.  Referral as noted.  I have reviewed the [place] Controlled Substance Reporting System web site prior to prescribing narcotic medicine for this patient.  Call if any problem.  Precautions discussed.  Electronically Signed Darreld Mclean, MD 9/25/20249:35 AM

## 2023-09-09 DIAGNOSIS — J441 Chronic obstructive pulmonary disease with (acute) exacerbation: Secondary | ICD-10-CM | POA: Diagnosis not present

## 2023-09-09 DIAGNOSIS — R030 Elevated blood-pressure reading, without diagnosis of hypertension: Secondary | ICD-10-CM | POA: Diagnosis not present

## 2023-09-09 DIAGNOSIS — R059 Cough, unspecified: Secondary | ICD-10-CM | POA: Diagnosis not present

## 2023-09-09 DIAGNOSIS — Z6833 Body mass index (BMI) 33.0-33.9, adult: Secondary | ICD-10-CM | POA: Diagnosis not present

## 2023-09-12 DIAGNOSIS — F17210 Nicotine dependence, cigarettes, uncomplicated: Secondary | ICD-10-CM | POA: Diagnosis not present

## 2023-09-12 DIAGNOSIS — R059 Cough, unspecified: Secondary | ICD-10-CM | POA: Diagnosis not present

## 2023-09-12 DIAGNOSIS — J441 Chronic obstructive pulmonary disease with (acute) exacerbation: Secondary | ICD-10-CM | POA: Diagnosis not present

## 2023-09-15 ENCOUNTER — Ambulatory Visit (INDEPENDENT_AMBULATORY_CARE_PROVIDER_SITE_OTHER): Payer: PPO | Admitting: Gastroenterology

## 2023-09-15 ENCOUNTER — Encounter (INDEPENDENT_AMBULATORY_CARE_PROVIDER_SITE_OTHER): Payer: Self-pay | Admitting: Gastroenterology

## 2023-09-15 VITALS — BP 111/72 | HR 85 | Temp 98.5°F | Ht 67.0 in | Wt 214.1 lb

## 2023-09-15 DIAGNOSIS — Z1159 Encounter for screening for other viral diseases: Secondary | ICD-10-CM

## 2023-09-15 DIAGNOSIS — K50818 Crohn's disease of both small and large intestine with other complication: Secondary | ICD-10-CM | POA: Diagnosis not present

## 2023-09-15 DIAGNOSIS — Z111 Encounter for screening for respiratory tuberculosis: Secondary | ICD-10-CM

## 2023-09-15 DIAGNOSIS — F17210 Nicotine dependence, cigarettes, uncomplicated: Secondary | ICD-10-CM

## 2023-09-15 NOTE — Patient Instructions (Addendum)
Schedule colonoscopy Continue Skyrizi every 8 weeks Perform blood workup Continue working on smoking cessation

## 2023-09-15 NOTE — Progress Notes (Signed)
Elizabeth [name], M.D. Gastroenterology & Hepatology [facility] Gastroenterology [address] [place], [place] [address]  Primary Care Physician: Richardean Chimera, MD [address] [place] [address]  I will communicate my assessment and recommendations to the referring MD via EMR.  Problems: ileocolonic c Crohn's disease status post ileocecectomy and partial colectomy  Recurrent perianal abscesses  History of Present Illness: Elizabeth [name] is a 62 y.o. female presenting today with a past medical history of  ileocolonic Crohn's disease status post ileocecectomy and partial colectomy, DVT on Xarelto, recurrent C. Diff, hypertension, GERD, obesity, who presents for follow up of Crohn's disease.  The patient was last seen on 07/12/2023. At that time, the patient was continued on Skyrizi for chronic disease.  She had changes that were initially concerning for perianal abscess, for which she was given an antibiotic course.  She saw Dr. Lovell Sheehan to undergo drainage but the abscess had resolved by the time she was seen in clinic.  Most recent labs from 07/25/2023 showed a fecal calprotectin of 203.  Also had blood workup on 07/12/2023 which showed a borderline CRP of 10, ESR 12, CMP with albumin of 3.7 with rest of hepatic panel and renal function within normal limits, CBC was normal.  Has been taking Skyrizi every 8 weeks compliantly.  She is having a BM twice a day.  Stools have soft consistency but they are formed . The patient denies having any nausea, vomiting, fever, chills, hematochezia, melena, hematemesis, abdominal distention, abdominal pain, diarrhea, jaundice, pruritus or weight loss.  She is currently smoking a pack of cigarettes every 2 months.  She is trying to completely stop smoking.  Had a recent episode of bronchitis, received prednisone for 5 days 50 mg qday.  Had recent bout of perianal abscess that resolved on its own and currently denies any perianal  discharge or pain.  Patient does not want to take any vaccines.  Last Colonoscopy:12/22/22- Hemorrhoids found on perianal exam. - The examined portion of the ileum was normal. - Non critical stenosis at the ileal surgical anastomosis. - Inflammation was found., improved compared to previous examinations. Chromoscopy performed. - One 20 mm polyp in the descending colon-Injected. Clips were placed. Clip manufacturer: area distal was tattooed. - Patent end-to-side colo-colonic anastomosis,  characterized by healthy appearing mucosa. - Three 3 to 6 mm polyps in the rectum - One 1 mm polyp in the rectum - The distal rectum and anal verge are normal on  retroflexion view. (1 SSP and 2 TAs)    Recommendations:  Repeat colonoscopy July 2024  Past Medical History: Past Medical History:  Diagnosis Date   Arthritis    Bronchitis    C. difficile diarrhea    Cancer (HCC)    Cough 01/28/2014   upper airway cough syndrome   Crohn's disease (HCC)    Diverticulitis    DVT 06/2020   history of   Elevated transaminase level    GERD (gastroesophageal reflux disease)    Hypertension    Obesity    Pneumonia 12/2013   Tobacco abuse     Past Surgical History: Past Surgical History:  Procedure Laterality Date   ABDOMINAL HYSTERECTOMY  12/13/1978   APPENDECTOMY     BIOPSY  11/11/2021   Procedure: BIOPSY;  Surgeon: Malissa Hippo, MD;  [facility];  Service: Endoscopy;;  bx of polyp and small bowel   COLON SURGERY     COLONOSCOPY     COLONOSCOPY  N/A 08/16/2014   Procedure: COLONOSCOPY;  Surgeon: Malissa Hippo, MD;  [facility];  Service: Endoscopy;  Laterality: N/A;  1200-rescheduled to 9/4 @ 10:45 Ann notified pt   COLONOSCOPY WITH PROPOFOL N/A 11/11/2021   Procedure: COLONOSCOPY WITH PROPOFOL;  Surgeon: Malissa Hippo, MD;  [facility];  Service: Endoscopy;  Laterality: N/A;  2:05   COLONOSCOPY WITH PROPOFOL N/A 12/22/2022   Procedure: COLONOSCOPY  WITH PROPOFOL;  Surgeon: Dolores Frame, MD;  [facility];  Service: Gastroenterology;  Laterality: N/A;  11:15am, asa 1-2   ESOPHAGOGASTRODUODENOSCOPY  06/16/2012   Procedure: ESOPHAGOGASTRODUODENOSCOPY (EGD);  Surgeon: Malissa Hippo, MD;  [facility];  Service: Endoscopy;  Laterality: N/A;  10:30 AM   hemocolectomy  12/14/2003   right   HEMOSTASIS CLIP PLACEMENT  12/22/2022   Procedure: HEMOSTASIS CLIP PLACEMENT;  Surgeon: Dolores Frame, MD;  [facility];  Service: Gastroenterology;;   Illeocecal Resection  2005   INCISION AND DRAINAGE ABSCESS N/A 11/10/2022   Procedure: INCISION AND DRAINAGE ABSCESS, PERIANAL;  Surgeon: Franky Macho, MD;  Location: [facility];  Service: General;  Laterality: N/A;   INCISIONAL HERNIA REPAIR N/A 08/24/2021   Procedure: HERNIA REPAIR INCISIONAL WITH MESH;  Surgeon: Franky Macho, MD;  Location: [facility];  Service: General;  Laterality: N/A;   LAPAROSCOPIC SIGMOID COLECTOMY N/A 01/07/2015   Procedure: LOW ANTERIOR COLON RESECTION;  Surgeon: Claud Kelp, MD;  Location: [facility] OR;  Service: General;  Laterality: N/A;   POLYPECTOMY  12/22/2022   Procedure: POLYPECTOMY;  Surgeon: Dolores Frame, MD;  [facility];  Service: Gastroenterology;;   SHOULDER ARTHROSCOPY WITH LABRAL REPAIR Right 01/20/2016   Procedure: SHOULDER ARTHROSCOPY WITH LABRAL REPAIR;  Surgeon: Cammy Copa, MD;  Location: [facility];  Service: Orthopedics;  Laterality: Right;   SUBMUCOSAL LIFTING INJECTION  12/22/2022   Procedure: SUBMUCOSAL LIFTING INJECTION;  Surgeon: Dolores Frame, MD;  [facility];  Service: Gastroenterology;;   SUBMUCOSAL TATTOO INJECTION  12/22/2022   Procedure: SUBMUCOSAL TATTOO INJECTION;  Surgeon: Marguerita Merles, Reuel Boom, MD;  [facility];  Service: Gastroenterology;;   TONGUE SURGERY Left 2018   cancer removed    Family History: Family History  Problem  Relation Age of Onset   Healthy Mother    Lung cancer Father        smoked   Allergic Disorder Brother    Healthy Daughter     Social History: Social History   Tobacco Use  Smoking Status Every Day   Current packs/day: 0.25   Average packs/day: 0.3 packs/day for 40.0 years (10.0 ttl pk-yrs)   Types: Cigarettes   Passive exposure: Current  Smokeless Tobacco Never  Tobacco Comments   increase smoking , very nervous   Social History   Substance and Sexual Activity  Alcohol Use Not Currently   Alcohol/week: 0.0 standard drinks of alcohol   Comment: occ   Social History   Substance and Sexual Activity  Drug Use No    Allergies: Allergies  Allergen Reactions   Contrast Media [Iodinated Contrast Media] Hives and Rash    Medications: Current Outpatient Medications  Medication Sig Dispense Refill   albuterol (VENTOLIN HFA) 108 (90 Base) MCG/ACT inhaler Inhale 2 puffs into the lungs every 6 (six) hours as needed for wheezing or shortness of breath.     Ascorbic Acid (VITAMIN C) 1000 MG tablet Take 1,000 mg by mouth every evening.  Cholecalciferol (VITAMIN D3) 50 MCG (2000 UT) TABS Take 2,000 Units by mouth every evening.     HYDROcodone-acetaminophen (NORCO/VICODIN) 5-325 MG tablet Take 1-2 tablets by mouth every 6 (six) hours as needed for moderate pain. 30 tablet 0   omeprazole (PRILOSEC) 40 MG capsule Take 40 mg by mouth daily before breakfast.     potassium chloride (MICRO-K) 10 MEQ CR capsule Take 10 mEq by mouth 2 (two) times daily.      predniSONE (DELTASONE) 50 MG tablet Take 50 mg by mouth daily with breakfast.     promethazine (PHENERGAN) 25 MG tablet TAKE 1 TABLET BY MOUTH TWICE A DAY AS NEEDED 20 tablet 0   Risankizumab-rzaa (SKYRIZI [place]) Inject into the skin. Once every 8 weeks.     rivaroxaban (XARELTO) 20 MG TABS tablet Take 1 tablet (20 mg total) by mouth daily with supper. 30 tablet    valsartan-hydrochlorothiazide (DIOVAN-HCT) 160-12.5 MG per tablet Take  1 tablet by mouth in the morning.     zinc gluconate 50 MG tablet Take 50 mg by mouth daily.     No current facility-administered medications for this visit.    Review of Systems: GENERAL: negative for malaise, night sweats HEENT: No changes in hearing or vision, no nose bleeds or other nasal problems. NECK: Negative for lumps, goiter, pain and significant neck swelling RESPIRATORY: Negative for cough, wheezing CARDIOVASCULAR: Negative for chest pain, leg swelling, palpitations, orthopnea GI: SEE HPI MUSCULOSKELETAL: Negative for joint pain or swelling, back pain, and muscle pain. SKIN: Negative for lesions, rash PSYCH: Negative for sleep disturbance, mood disorder and recent psychosocial stressors. HEMATOLOGY Negative for prolonged bleeding, bruising easily, and swollen nodes. ENDOCRINE: Negative for cold or heat intolerance, polyuria, polydipsia and goiter. NEURO: negative for tremor, gait imbalance, syncope and seizures. The remainder of the review of systems is noncontributory.   Physical Exam: BP 111/72 (BP Location: Left Arm, Patient Position: Sitting, Cuff Size: Large)   Pulse 85   Temp 98.5 F (36.9 C) (Oral)   Ht 5\' 7"  (1.702 m)   Wt 214 lb 1.6 oz (97.1 kg)   BMI 33.53 kg/m  GENERAL: The patient is AO x3, in no acute distress. HEENT: Head is normocephalic and atraumatic. EOMI are intact. Mouth is well hydrated and without lesions. NECK: Supple. No masses LUNGS: Clear to auscultation. No presence of rhonchi/wheezing/rales. Adequate chest expansion HEART: RRR, normal s1 and s2. ABDOMEN: Soft, nontender, no guarding, no peritoneal signs, and nondistended. BS +. No masses. EXTREMITIES: Without any cyanosis, clubbing, rash, lesions or edema. NEUROLOGIC: AOx3, no focal motor deficit. SKIN: no jaundice, no rashes  Imaging/Labs: as above  I personally reviewed and interpreted the available labs, imaging and endoscopic files.  Impression and Plan: KATRINA DADDONA is a 62  y.o. female presenting today with a past medical history of  ileocolonic Crohn's disease status post ileocecectomy and partial colectomy, DVT on Xarelto, recurrent C. Diff, hypertension, GERD, obesity, who presents for follow up of Crohn's disease.  The patient has presented significant clinical endoscopic improvement while using Skyrizi every 8 weeks.  She is currently doing quite well and denies any complaints.  Occasionally she has some flares of perianal abscesses which have been management antibiotics.  I emphasized the importance of complete smoking cessation as this may lead to better control of her Crohn's and possibly will have any impact in her perianal disease.  Patient understood and agreed.  Will obtain surveillance for TB and hep B.  The patient is ready  to schedule her surveillance colonoscopy given history of piecemeal polypectomy.  Regarding her preventative measures, the patient is not interested in receiving any vaccination.  - Schedule colonoscopy - Continue Skyrizi every 8 weeks - Chec Quantiferon and hepatitis B s Ag. - Continue working on smoking cessation  All questions were answered.      Elizabeth Blazing, MD Gastroenterology and Hepatology [facility] Gastroenterology

## 2023-09-21 ENCOUNTER — Other Ambulatory Visit: Payer: Self-pay | Admitting: Orthopaedic Surgery

## 2023-09-21 ENCOUNTER — Ambulatory Visit: Payer: PPO | Admitting: Sports Medicine

## 2023-09-21 NOTE — Telephone Encounter (Signed)
Dr. Sanjuan Dame - pt lvm requesting a refill on Hydrocodone 5-325 to be sent to [facility] in [place].  She stated she is doing better, but having pain at night.

## 2023-09-23 ENCOUNTER — Other Ambulatory Visit: Payer: Self-pay | Admitting: Orthopaedic Surgery

## 2023-09-26 ENCOUNTER — Telehealth: Payer: Self-pay | Admitting: Orthopaedic Surgery

## 2023-09-26 NOTE — Telephone Encounter (Signed)
Returned the patient's call, rang and rang until it said "your call can not be completed at this time"

## 2023-09-27 ENCOUNTER — Other Ambulatory Visit: Payer: Self-pay | Admitting: Orthopaedic Surgery

## 2023-09-27 ENCOUNTER — Telehealth: Payer: Self-pay

## 2023-09-27 NOTE — Telephone Encounter (Signed)
Called patient to advise her that she could not get pain medication form Dr Hilda Lias because she is  getting liquid form of pain meds from another doctor and told her that she needs documentation from the dr that she is receiving the liquid medication from  to bring to [place]

## 2023-09-27 NOTE — Telephone Encounter (Signed)
Dr. Sanjuan Dame pt - pt is requesting a refill on Hydrocodone 5-325 to be sent to [facility] in [facility]

## 2023-10-05 ENCOUNTER — Telehealth (INDEPENDENT_AMBULATORY_CARE_PROVIDER_SITE_OTHER): Payer: Self-pay | Admitting: Gastroenterology

## 2023-10-05 NOTE — Telephone Encounter (Signed)
Left message for pt to return call to schedule TCS asa 3 Castaneda. Pt was seen in office10/3/24-Clenpiq sample given at that time

## 2023-10-09 DIAGNOSIS — R399 Unspecified symptoms and signs involving the genitourinary system: Secondary | ICD-10-CM | POA: Diagnosis not present

## 2023-10-09 DIAGNOSIS — R350 Frequency of micturition: Secondary | ICD-10-CM | POA: Diagnosis not present

## 2023-10-11 DIAGNOSIS — R300 Dysuria: Secondary | ICD-10-CM | POA: Diagnosis not present

## 2023-10-11 DIAGNOSIS — M545 Low back pain, unspecified: Secondary | ICD-10-CM | POA: Diagnosis not present

## 2023-10-11 DIAGNOSIS — Z6834 Body mass index (BMI) 34.0-34.9, adult: Secondary | ICD-10-CM | POA: Diagnosis not present

## 2023-10-11 DIAGNOSIS — R030 Elevated blood-pressure reading, without diagnosis of hypertension: Secondary | ICD-10-CM | POA: Diagnosis not present

## 2023-10-12 ENCOUNTER — Telehealth (INDEPENDENT_AMBULATORY_CARE_PROVIDER_SITE_OTHER): Payer: Self-pay | Admitting: Gastroenterology

## 2023-10-12 NOTE — Telephone Encounter (Signed)
Pt left message returning call.  Returned call to patient. Pt scheduled for 11/08/23 at 10:30am. No PA needed. Will sent instructions via mail once pre op received.

## 2023-10-12 NOTE — Telephone Encounter (Signed)
Clearance to hold Xarelto for 2 days sent to PCP

## 2023-10-12 NOTE — Telephone Encounter (Signed)
10/12/23  Sunni G Mincer 1961/09/04  What type of surgery is being performed? Colonoscopy  When is surgery scheduled? 11/08/23  Clearance to hold Xarelto for 2 days prior   Name of physician performing surgery?  Dr. Katrinka Blazing [facility] Phone: [phone] Fax: [phone]  Anethesia type (none, local, MAC, general)? MAC

## 2023-10-13 NOTE — Telephone Encounter (Signed)
Clearance received from Dr.Daniels stating This is OK (to hold xarelto for 2 days) Forgot to put on instructions. Will update instructions and mail to pt.

## 2023-10-18 ENCOUNTER — Other Ambulatory Visit (INDEPENDENT_AMBULATORY_CARE_PROVIDER_SITE_OTHER): Payer: Self-pay | Admitting: *Deleted

## 2023-10-18 DIAGNOSIS — Z79899 Other long term (current) drug therapy: Secondary | ICD-10-CM

## 2023-10-18 DIAGNOSIS — Z1159 Encounter for screening for other viral diseases: Secondary | ICD-10-CM

## 2023-10-18 DIAGNOSIS — K50818 Crohn's disease of both small and large intestine with other complication: Secondary | ICD-10-CM

## 2023-10-18 DIAGNOSIS — Z111 Encounter for screening for respiratory tuberculosis: Secondary | ICD-10-CM

## 2023-10-19 ENCOUNTER — Ambulatory Visit: Payer: PPO | Admitting: Orthopaedic Surgery

## 2023-10-20 DIAGNOSIS — Z111 Encounter for screening for respiratory tuberculosis: Secondary | ICD-10-CM | POA: Diagnosis not present

## 2023-10-20 DIAGNOSIS — K50818 Crohn's disease of both small and large intestine with other complication: Secondary | ICD-10-CM | POA: Diagnosis not present

## 2023-10-20 DIAGNOSIS — Z79899 Other long term (current) drug therapy: Secondary | ICD-10-CM | POA: Diagnosis not present

## 2023-10-20 DIAGNOSIS — Z1159 Encounter for screening for other viral diseases: Secondary | ICD-10-CM | POA: Diagnosis not present

## 2023-10-24 ENCOUNTER — Other Ambulatory Visit (INDEPENDENT_AMBULATORY_CARE_PROVIDER_SITE_OTHER): Payer: Self-pay | Admitting: Gastroenterology

## 2023-10-24 ENCOUNTER — Telehealth (INDEPENDENT_AMBULATORY_CARE_PROVIDER_SITE_OTHER): Payer: Self-pay | Admitting: *Deleted

## 2023-10-24 DIAGNOSIS — R109 Unspecified abdominal pain: Secondary | ICD-10-CM

## 2023-10-24 MED ORDER — HYOSCYAMINE SULFATE 0.125 MG PO TABS
0.1250 mg | ORAL_TABLET | Freq: Four times a day (QID) | ORAL | 1 refills | Status: DC | PRN
Start: 2023-10-24 — End: 2023-11-20

## 2023-10-24 NOTE — Telephone Encounter (Signed)
Spoke with patient about her symptoms, recommended CT abdomen and pelvis with IV contast but she wants to hold off on this for now. Will sent prescription of Levsin, she will stop Bentyl.

## 2023-10-24 NOTE — Telephone Encounter (Addendum)
Pt states she is having stomach cramping for one and a half weeks and a lot of gas.  States bentyl is not working. Ate a salad about one week ago and that when she started having symptoms. I asked her the dose of bentyl she took because it is not on med list and she states she is not sure it was an old prescription and it never worked for her.   [phone]

## 2023-10-25 LAB — QUANTIFERON-TB GOLD PLUS
QuantiFERON Mitogen Value: >10 [IU]/mL
QuantiFERON Nil Value: 0.03 [IU]/mL
QuantiFERON TB1 Ag Value: 0.04 [IU]/mL
QuantiFERON TB2 Ag Value: 0.03 [IU]/mL
QuantiFERON-TB Gold Plus: NEGATIVE

## 2023-10-25 LAB — HEPATITIS B SURFACE ANTIGEN: Hepatitis B Surface Ag: NEGATIVE

## 2023-10-26 ENCOUNTER — Encounter: Payer: Self-pay | Admitting: Orthopaedic Surgery

## 2023-10-26 ENCOUNTER — Ambulatory Visit: Payer: PPO | Admitting: Orthopaedic Surgery

## 2023-10-26 VITALS — BP 95/62 | HR 108 | Ht 67.0 in | Wt 220.0 lb

## 2023-10-26 DIAGNOSIS — F17210 Nicotine dependence, cigarettes, uncomplicated: Secondary | ICD-10-CM | POA: Diagnosis not present

## 2023-10-26 DIAGNOSIS — M7061 Trochanteric bursitis, right hip: Secondary | ICD-10-CM

## 2023-10-26 DIAGNOSIS — Z1329 Encounter for screening for other suspected endocrine disorder: Secondary | ICD-10-CM | POA: Diagnosis not present

## 2023-10-26 DIAGNOSIS — M7062 Trochanteric bursitis, left hip: Secondary | ICD-10-CM

## 2023-10-26 DIAGNOSIS — Z131 Encounter for screening for diabetes mellitus: Secondary | ICD-10-CM | POA: Diagnosis not present

## 2023-10-26 DIAGNOSIS — I959 Hypotension, unspecified: Secondary | ICD-10-CM | POA: Diagnosis not present

## 2023-10-26 DIAGNOSIS — Z13220 Encounter for screening for lipoid disorders: Secondary | ICD-10-CM | POA: Diagnosis not present

## 2023-10-26 DIAGNOSIS — I10 Essential (primary) hypertension: Secondary | ICD-10-CM | POA: Diagnosis not present

## 2023-10-26 MED ORDER — HYDROCODONE-ACETAMINOPHEN 5-325 MG PO TABS: 1.0000 | ORAL_TABLET | Freq: Four times a day (QID) | ORAL | 0 refills | Status: DC | PRN

## 2023-10-26 NOTE — Progress Notes (Signed)
My hips are tender but better.  She has less pain in the hips.  She has been on prednisone recently by her family doctor.  She had been on hydrocodone suspension is not on it now.  She walking well. ROM of the hips is good. They are only slightly tender over the trochanteric areas.  NV intact.  Encounter Diagnoses  Name Primary?   Trochanteric bursitis, left hip Yes   Trochanteric bursitis, right hip    Nicotine dependence, cigarettes, uncomplicated    Return in two months.  I have reviewed the [place] Controlled Substance Reporting System web site prior to prescribing narcotic medicine for this patient.  Call if any problem.  Precautions discussed.  Electronically Signed Darreld Mclean, MD 11/13/20242:00 PM

## 2023-10-31 DIAGNOSIS — I10 Essential (primary) hypertension: Secondary | ICD-10-CM | POA: Diagnosis not present

## 2023-10-31 DIAGNOSIS — K439 Ventral hernia without obstruction or gangrene: Secondary | ICD-10-CM | POA: Diagnosis not present

## 2023-10-31 DIAGNOSIS — K5090 Crohn's disease, unspecified, without complications: Secondary | ICD-10-CM | POA: Diagnosis not present

## 2023-10-31 DIAGNOSIS — R4582 Worries: Secondary | ICD-10-CM | POA: Diagnosis not present

## 2023-10-31 DIAGNOSIS — K2100 Gastro-esophageal reflux disease with esophagitis, without bleeding: Secondary | ICD-10-CM | POA: Diagnosis not present

## 2023-10-31 DIAGNOSIS — E669 Obesity, unspecified: Secondary | ICD-10-CM | POA: Diagnosis not present

## 2023-10-31 DIAGNOSIS — F331 Major depressive disorder, recurrent, moderate: Secondary | ICD-10-CM | POA: Diagnosis not present

## 2023-10-31 DIAGNOSIS — I82509 Chronic embolism and thrombosis of unspecified deep veins of unspecified lower extremity: Secondary | ICD-10-CM | POA: Diagnosis not present

## 2023-10-31 DIAGNOSIS — F17210 Nicotine dependence, cigarettes, uncomplicated: Secondary | ICD-10-CM | POA: Diagnosis not present

## 2023-10-31 DIAGNOSIS — N3281 Overactive bladder: Secondary | ICD-10-CM | POA: Diagnosis not present

## 2023-10-31 DIAGNOSIS — Z6834 Body mass index (BMI) 34.0-34.9, adult: Secondary | ICD-10-CM | POA: Diagnosis not present

## 2023-11-01 NOTE — Patient Instructions (Signed)
Elizabeth [name]  11/01/2023     @PREFPERIOPPHARMACY @   Your procedure is scheduled on  11/08/2023.   [facility] to [facility] at  0830  A.M.   Call this number if you have problems the morning of surgery:  [phone]  If you experience any cold or flu symptoms such as cough, fever, chills, shortness of breath, etc. between now and your scheduled surgery, please notify us at the above number.   Remember:       Your last dose of xarelto should have been on 11/05/2023.     Follow the diet and prep instructions given to you by the office.   You may drink clear liquids until 0630 am on 11/08/2023.    Clear liquids allowed are:                    Water, Juice (No red color; non-citric and without pulp; diabetics please choose diet or no sugar options), Carbonated beverages (diabetics please choose diet or no sugar options), Clear Tea (No creamer, milk, or cream, including half & half and powdered creamer), Black Coffee Only (No creamer, milk or cream, including half & half and powdered creamer), and Clear Sports drink (No red color; diabetics please choose diet or no sugar options)    Take these medicines the morning of surgery with A SIP OF WATER                    hydrocodone (if needed), omeprazole.     Do not wear jewelry, make-up or nail polish, including gel polish,  artificial nails, or any other type of covering on natural nails (fingers and  toes).  Do not wear lotions, powders, or perfumes, or deodorant.  Do not shave 48 hours prior to surgery.  Men may shave face and neck.  Do not bring valuables to the hospital.  [facility] is not responsible for any belongings or valuables.  Contacts, dentures or bridgework may not be worn into surgery.  Leave your suitcase in the car.  After surgery it may be brought to your room.  For patients admitted to the hospital, discharge time will be determined by your treatment team.  Patients discharged the day of surgery will  not be allowed to drive home and must have someone with them for 24 hours.    Special instructions:   DO NOT smoke tobacco or vape for 24 hours before your procedure.  Please read over the following fact sheets that you were given. Anesthesia Post-op Instructions and Care and Recovery After Surgery      Colonoscopy, Adult, Care After The following information offers guidance on how to care for yourself after your procedure. Your health care provider may also give you more specific instructions. If you have problems or questions, contact your health care provider. What can I expect after the procedure? After the procedure, it is common to have: A small amount of blood in your stool for 24 hours after the procedure. Some gas. Mild cramping or bloating of your abdomen. Follow these instructions at home: Eating and drinking  Drink enough fluid to keep your urine pale yellow. Follow instructions from your health care provider about eating or drinking restrictions. Resume your normal diet as told by your health care provider. Avoid heavy or fried foods that are hard to digest. Activity Rest as told by your health care provider. Avoid sitting for a long time without moving. Get  up to take short walks every 1-2 hours. This is important to improve blood flow and breathing. Ask for help if you feel weak or unsteady. Return to your normal activities as told by your health care provider. Ask your health care provider what activities are safe for you. Managing cramping and bloating  Try walking around when you have cramps or feel bloated. If directed, apply heat to your abdomen as told by your health care provider. Use the heat source that your health care provider recommends, such as a moist heat pack or a heating pad. Place a towel between your skin and the heat source. Leave the heat on for 20-30 minutes. Remove the heat if your skin turns bright red. This is especially important if you are  unable to feel pain, heat, or cold. You have a greater risk of getting burned. General instructions If you were given a sedative during the procedure, it can affect you for several hours. Do not drive or operate machinery until your health care provider says that it is safe. For the first 24 hours after the procedure: Do not sign important documents. Do not drink alcohol. Do your regular daily activities at a slower pace than normal. Eat soft foods that are easy to digest. Take over-the-counter and prescription medicines only as told by your health care provider. Keep all follow-up visits. This is important. Contact a health care provider if: You have blood in your stool 2-3 days after the procedure. Get help right away if: You have more than a small spotting of blood in your stool. You have large blood clots in your stool. You have swelling of your abdomen. You have nausea or vomiting. You have a fever. You have increasing pain in your abdomen that is not relieved with medicine. These symptoms may be an emergency. Get help right away. Call 911. Do not wait to see if the symptoms will go away. Do not drive yourself to the hospital. Summary After the procedure, it is common to have a small amount of blood in your stool. You may also have mild cramping and bloating of your abdomen. If you were given a sedative during the procedure, it can affect you for several hours. Do not drive or operate machinery until your health care provider says that it is safe. Get help right away if you have a lot of blood in your stool, nausea or vomiting, a fever, or increased pain in your abdomen. This information is not intended to replace advice given to you by your health care provider. Make sure you discuss any questions you have with your health care provider. Document Revised: 01/11/2023 Document Reviewed: 07/22/2021 Elsevier Patient Education  2024 [employer] Monitored Anesthesia Care, Care  After The following information offers guidance on how to care for yourself after your procedure. Your health care provider may also give you more specific instructions. If you have problems or questions, contact your health care provider. What can I expect after the procedure? After the procedure, it is common to have: Tiredness. Little or no memory about what happened during or after the procedure. Impaired judgment when it comes to making decisions. Nausea or vomiting. Some trouble with balance. Follow these instructions at home: For the time period you were told by your health care provider:  Rest. Do not participate in activities where you could fall or become injured. Do not drive or use machinery. Do not drink alcohol. Do not take sleeping pills or medicines that cause drowsiness.  Do not make important decisions or sign legal documents. Do not take care of children on your own. Medicines Take over-the-counter and prescription medicines only as told by your health care provider. If you were prescribed antibiotics, take them as told by your health care provider. Do not stop using the antibiotic even if you start to feel better. Eating and drinking Follow instructions from your health care provider about what you may eat and drink. Drink enough fluid to keep your urine pale yellow. If you vomit: Drink clear fluids slowly and in small amounts as you are able. Clear fluids include water, ice chips, low-calorie sports drinks, and fruit juice that has water added to it (diluted fruit juice). Eat light and bland foods in small amounts as you are able. These foods include bananas, applesauce, rice, lean meats, toast, and crackers. General instructions  Have a responsible adult stay with you for the time you are told. It is important to have someone help care for you until you are awake and alert. If you have sleep apnea, surgery and some medicines can increase your risk for breathing  problems. Follow instructions from your health care provider about wearing your sleep device: When you are sleeping. This includes during daytime naps. While taking prescription pain medicines, sleeping medicines, or medicines that make you drowsy. Do not use any products that contain nicotine or tobacco. These products include cigarettes, chewing tobacco, and vaping devices, such as e-cigarettes. If you need help quitting, ask your health care provider. Contact a health care provider if: You feel nauseous or vomit every time you eat or drink. You feel light-headed. You are still sleepy or having trouble with balance after 24 hours. You get a rash. You have a fever. You have redness or swelling around the IV site. Get help right away if: You have trouble breathing. You have new confusion after you get home. These symptoms may be an emergency. Get help right away. Call 911. Do not wait to see if the symptoms will go away. Do not drive yourself to the hospital. This information is not intended to replace advice given to you by your health care provider. Make sure you discuss any questions you have with your health care provider. Document Revised: 04/26/2022 Document Reviewed: 04/26/2022 Elsevier Patient Education  2024 [employer].

## 2023-11-02 ENCOUNTER — Encounter (HOSPITAL_COMMUNITY): Payer: Self-pay

## 2023-11-02 ENCOUNTER — Encounter (HOSPITAL_COMMUNITY)
Admission: RE | Admit: 2023-11-02 | Discharge: 2023-11-02 | Disposition: A | Discharge status: Home / Self Care | Payer: PPO | Source: Ambulatory Visit | Attending: Gastroenterology

## 2023-11-02 VITALS — BP 95/62 | HR 98 | Temp 97.7°F | Resp 18 | Ht 67.0 in | Wt 218.0 lb

## 2023-11-02 DIAGNOSIS — I4519 Other right bundle-branch block: Secondary | ICD-10-CM | POA: Insufficient documentation

## 2023-11-02 DIAGNOSIS — Z01810 Encounter for preprocedural cardiovascular examination: Secondary | ICD-10-CM | POA: Insufficient documentation

## 2023-11-02 DIAGNOSIS — Z720 Tobacco use: Secondary | ICD-10-CM | POA: Insufficient documentation

## 2023-11-02 DIAGNOSIS — Z01818 Encounter for other preprocedural examination: Secondary | ICD-10-CM | POA: Diagnosis present

## 2023-11-08 ENCOUNTER — Ambulatory Visit (HOSPITAL_COMMUNITY)
Admission: RE | Admit: 2023-11-08 | Discharge: 2023-11-08 | Disposition: A | Discharge status: Home / Self Care | Payer: PPO | Attending: Gastroenterology | Admitting: Gastroenterology

## 2023-11-08 ENCOUNTER — Encounter (INDEPENDENT_AMBULATORY_CARE_PROVIDER_SITE_OTHER): Payer: Self-pay | Admitting: *Deleted

## 2023-11-08 ENCOUNTER — Ambulatory Visit (HOSPITAL_BASED_OUTPATIENT_CLINIC_OR_DEPARTMENT_OTHER): Payer: PPO | Admitting: Anesthesiology

## 2023-11-08 ENCOUNTER — Encounter (HOSPITAL_COMMUNITY)
Admission: RE | Disposition: A | Discharge status: Home / Self Care | Payer: Self-pay | Source: Home / Self Care | Attending: Gastroenterology

## 2023-11-08 ENCOUNTER — Ambulatory Visit (HOSPITAL_COMMUNITY): Payer: PPO | Admitting: Anesthesiology

## 2023-11-08 DIAGNOSIS — Z86718 Personal history of other venous thrombosis and embolism: Secondary | ICD-10-CM | POA: Diagnosis not present

## 2023-11-08 DIAGNOSIS — E669 Obesity, unspecified: Secondary | ICD-10-CM | POA: Insufficient documentation

## 2023-11-08 DIAGNOSIS — Z1211 Encounter for screening for malignant neoplasm of colon: Secondary | ICD-10-CM | POA: Insufficient documentation

## 2023-11-08 DIAGNOSIS — I10 Essential (primary) hypertension: Secondary | ICD-10-CM | POA: Diagnosis not present

## 2023-11-08 DIAGNOSIS — Z7901 Long term (current) use of anticoagulants: Secondary | ICD-10-CM | POA: Insufficient documentation

## 2023-11-08 DIAGNOSIS — K9189 Other postprocedural complications and disorders of digestive system: Secondary | ICD-10-CM | POA: Diagnosis not present

## 2023-11-08 DIAGNOSIS — K648 Other hemorrhoids: Secondary | ICD-10-CM | POA: Insufficient documentation

## 2023-11-08 DIAGNOSIS — Z6834 Body mass index (BMI) 34.0-34.9, adult: Secondary | ICD-10-CM | POA: Insufficient documentation

## 2023-11-08 DIAGNOSIS — K219 Gastro-esophageal reflux disease without esophagitis: Secondary | ICD-10-CM | POA: Insufficient documentation

## 2023-11-08 DIAGNOSIS — Z9049 Acquired absence of other specified parts of digestive tract: Secondary | ICD-10-CM | POA: Diagnosis not present

## 2023-11-08 DIAGNOSIS — K635 Polyp of colon: Secondary | ICD-10-CM

## 2023-11-08 DIAGNOSIS — D123 Benign neoplasm of transverse colon: Secondary | ICD-10-CM | POA: Diagnosis not present

## 2023-11-08 DIAGNOSIS — Z980 Intestinal bypass and anastomosis status: Secondary | ICD-10-CM | POA: Diagnosis not present

## 2023-11-08 DIAGNOSIS — Z860100 Personal history of colon polyps, unspecified: Secondary | ICD-10-CM | POA: Diagnosis not present

## 2023-11-08 DIAGNOSIS — K6389 Other specified diseases of intestine: Secondary | ICD-10-CM | POA: Insufficient documentation

## 2023-11-08 DIAGNOSIS — K529 Noninfective gastroenteritis and colitis, unspecified: Secondary | ICD-10-CM

## 2023-11-08 DIAGNOSIS — F17210 Nicotine dependence, cigarettes, uncomplicated: Secondary | ICD-10-CM | POA: Insufficient documentation

## 2023-11-08 DIAGNOSIS — K5080 Crohn's disease of both small and large intestine without complications: Secondary | ICD-10-CM | POA: Insufficient documentation

## 2023-11-08 DIAGNOSIS — K5730 Diverticulosis of large intestine without perforation or abscess without bleeding: Secondary | ICD-10-CM | POA: Diagnosis not present

## 2023-11-08 DIAGNOSIS — K56699 Other intestinal obstruction unspecified as to partial versus complete obstruction: Secondary | ICD-10-CM | POA: Diagnosis not present

## 2023-11-08 DIAGNOSIS — Z9889 Other specified postprocedural states: Secondary | ICD-10-CM | POA: Insufficient documentation

## 2023-11-08 HISTORY — PX: BIOPSY: SHX5522

## 2023-11-08 HISTORY — PX: SUBMUCOSAL LIFTING INJECTION: SHX6855

## 2023-11-08 HISTORY — PX: POLYPECTOMY: SHX5525

## 2023-11-08 HISTORY — PX: BALLOON DILATION: SHX5330

## 2023-11-08 HISTORY — PX: COLONOSCOPY WITH PROPOFOL: SHX5780

## 2023-11-08 HISTORY — PX: HEMOSTASIS CLIP PLACEMENT: SHX6857

## 2023-11-08 LAB — HM COLONOSCOPY

## 2023-11-08 SURGERY — COLONOSCOPY WITH PROPOFOL
Anesthesia: General

## 2023-11-08 MED ORDER — PHENYLEPHRINE 80 MCG/ML (10ML) SYRINGE FOR IV PUSH (FOR BLOOD PRESSURE SUPPORT)
PREFILLED_SYRINGE | INTRAVENOUS | Status: AC
Start: 2023-11-08 — End: ?
  Filled 2023-11-08: qty 10

## 2023-11-08 MED ORDER — PROPOFOL 10 MG/ML IV BOLUS
INTRAVENOUS | Status: DC | PRN
  Administered 2023-11-08 (×4): 50 mg via INTRAVENOUS
  Administered 2023-11-08: 100 mg via INTRAVENOUS
  Administered 2023-11-08: 50 mg via INTRAVENOUS

## 2023-11-08 MED ORDER — PROPOFOL 500 MG/50ML IV EMUL
INTRAVENOUS | Status: DC | PRN
  Administered 2023-11-08: 150 ug/kg/min via INTRAVENOUS

## 2023-11-08 MED ORDER — PHENYLEPHRINE 80 MCG/ML (10ML) SYRINGE FOR IV PUSH (FOR BLOOD PRESSURE SUPPORT)
PREFILLED_SYRINGE | INTRAVENOUS | Status: DC | PRN
  Administered 2023-11-08 (×5): 160 ug via INTRAVENOUS

## 2023-11-08 MED ORDER — LIDOCAINE HCL (PF) 2 % IJ SOLN
INTRAMUSCULAR | Status: DC | PRN
  Administered 2023-11-08: 50 mg via INTRADERMAL

## 2023-11-08 MED ORDER — LACTATED RINGERS IV SOLN: INTRAVENOUS | Status: DC | PRN

## 2023-11-08 NOTE — Anesthesia Preprocedure Evaluation (Signed)
Anesthesia Evaluation  Patient identified by MRN, date of birth, ID band Patient awake    Reviewed: Allergy & Precautions, H&P , NPO status , Patient's Chart, lab work & pertinent test results, reviewed documented beta blocker date and time   Airway Mallampati: II  TM Distance: >3 FB Neck ROM: full    Dental no notable dental hx. (+) Dental Advisory Given, Teeth Intact   Pulmonary Current Smoker Bronchitis   Pulmonary exam normal breath sounds clear to auscultation       Cardiovascular Exercise Tolerance: Good hypertension, Normal cardiovascular exam Rhythm:regular Rate:Normal     Neuro/Psych  Neuromuscular disease  negative psych ROS   GI/Hepatic Neg liver ROS,GERD  Medicated,,Crohn's disease   Endo/Other    Renal/GU negative Renal ROS  negative genitourinary   Musculoskeletal  (+) Arthritis , Osteoarthritis,    Abdominal   Peds  Hematology negative hematology ROS (+)   Anesthesia Other Findings Cancer  Reproductive/Obstetrics negative OB ROS                             Anesthesia Physical Anesthesia Plan  ASA: 3  Anesthesia Plan: General   Post-op Pain Management:    Induction:   PONV Risk Score and Plan: Propofol infusion  Airway Management Planned: Nasal Cannula and Natural Airway  Additional Equipment: None  Intra-op Plan:   Post-operative Plan:   Informed Consent: I have reviewed the patients History and Physical, chart, labs and discussed the procedure including the risks, benefits and alternatives for the proposed anesthesia with the patient or authorized representative who has indicated his/her understanding and acceptance.     Dental Advisory Given  Plan Discussed with: CRNA  Anesthesia Plan Comments:        Anesthesia Quick Evaluation

## 2023-11-08 NOTE — H&P (Signed)
Elizabeth [name] is an 62 y.o. female.   Chief Complaint: History of piecemeal polypectomy HPI: Elizabeth [name] is a 62 y.o. female presenting today with a past medical history of  ileocolonic Crohn's disease status post ileocecectomy and partial colectomy, DVT on Xarelto, recurrent C. Diff, hypertension, GERD, obesity, who presents for follow up of history of piecemeal polypectomy.  The patient denies having any nausea, vomiting, fever, chills, hematochezia, melena, hematemesis, abdominal distention, abdominal pain, diarrhea, jaundice, pruritus or weight loss.   Past Medical History:  Diagnosis Date   Arthritis    Bronchitis    C. difficile diarrhea    Cancer (HCC)    Cough 01/28/2014   upper airway cough syndrome   Crohn's disease (HCC)    Diverticulitis    DVT 06/2020   history of   Elevated transaminase level    GERD (gastroesophageal reflux disease)    Hypertension    Obesity    Pneumonia 12/2013   Tobacco abuse     Past Surgical History:  Procedure Laterality Date   ABDOMINAL HYSTERECTOMY  12/13/1978   APPENDECTOMY     BIOPSY  11/11/2021   Procedure: BIOPSY;  Surgeon: Malissa Hippo, MD;  [facility]: [facility];  Service: Endoscopy;;  bx of polyp and small bowel   COLON SURGERY     COLONOSCOPY     COLONOSCOPY N/A 08/16/2014   Procedure: COLONOSCOPY;  Surgeon: Malissa Hippo, MD;  Location: [facility];  Service: Endoscopy;  Laterality: N/A;  1200-rescheduled to 9/4 @ 10:45 Ann notified pt   COLONOSCOPY WITH PROPOFOL N/A 11/11/2021   Procedure: COLONOSCOPY WITH PROPOFOL;  Surgeon: Malissa Hippo, MD;  [facility]: [facility];  Service: Endoscopy;  Laterality: N/A;  2:05   COLONOSCOPY WITH PROPOFOL N/A 12/22/2022   Procedure: COLONOSCOPY WITH PROPOFOL;  Surgeon: Dolores Frame, MD;  Location: [facility];  Service: Gastroenterology;  Laterality: N/A;  11:15am, asa 1-2   ESOPHAGOGASTRODUODENOSCOPY  06/16/2012   Procedure: ESOPHAGOGASTRODUODENOSCOPY (EGD);   Surgeon: Malissa Hippo, MD;  [facility]: [facility];  Service: Endoscopy;  Laterality: N/A;  10:30 AM   hemocolectomy  12/14/2003   right   HEMOSTASIS CLIP PLACEMENT  12/22/2022   Procedure: HEMOSTASIS CLIP PLACEMENT;  Surgeon: Dolores Frame, MD;  [facility]: [facility];  Service: Gastroenterology;;   Illeocecal Resection  2005   INCISION AND DRAINAGE ABSCESS N/A 11/10/2022   Procedure: INCISION AND DRAINAGE ABSCESS, PERIANAL;  Surgeon: Franky Macho, MD;  Location: [facility];  Service: General;  Laterality: N/A;   INCISIONAL HERNIA REPAIR N/A 08/24/2021   Procedure: HERNIA REPAIR INCISIONAL WITH MESH;  Surgeon: Franky Macho, MD;  Location: [facility];  Service: General;  Laterality: N/A;   LAPAROSCOPIC SIGMOID COLECTOMY N/A 01/07/2015   Procedure: LOW ANTERIOR COLON RESECTION;  Surgeon: Claud Kelp, MD;  Location: [facility];  Service: General;  Laterality: N/A;   POLYPECTOMY  12/22/2022   Procedure: POLYPECTOMY;  Surgeon: Dolores Frame, MD;  [facility];  Service: Gastroenterology;;   SHOULDER ARTHROSCOPY WITH LABRAL REPAIR Right 01/20/2016   Procedure: SHOULDER ARTHROSCOPY WITH LABRAL REPAIR;  Surgeon: Cammy Copa, MD;  Location: [facility];  Service: Orthopedics;  Laterality: Right;   SUBMUCOSAL LIFTING INJECTION  12/22/2022   Procedure: SUBMUCOSAL LIFTING INJECTION;  Surgeon: Dolores Frame, MD;  [facility];  Service: Gastroenterology;;   SUBMUCOSAL TATTOO INJECTION  12/22/2022   Procedure: SUBMUCOSAL TATTOO INJECTION;  Surgeon: Dolores Frame, MD;  [facility];  Service: Gastroenterology;;   TONGUE SURGERY Left 2018   cancer removed    Family History  Problem Relation Age of Onset   Healthy Mother    Lung cancer Father        smoked   Allergic Disorder Brother    Healthy Daughter    Social History:  reports that she has been smoking cigarettes. She has a 10 pack-year smoking history. She has been  exposed to tobacco smoke. She has never used smokeless tobacco. She reports that she does not currently use alcohol. She reports that she does not use drugs.  Allergies:  Allergies  Allergen Reactions   Contrast Media [Iodinated Contrast Media] Hives and Rash    Medications Prior to Admission  Medication Sig Dispense Refill   Ascorbic Acid (VITAMIN C) 1000 MG tablet Take 1,000 mg by mouth every evening.     Cholecalciferol (VITAMIN D3) 50 MCG (2000 UT) TABS Take 2,000 Units by mouth every evening.     omeprazole (PRILOSEC) 40 MG capsule Take 40 mg by mouth daily before breakfast.     potassium chloride (MICRO-K) 10 MEQ CR capsule Take 10 mEq by mouth 2 (two) times daily.      valsartan-hydrochlorothiazide (DIOVAN-HCT) 160-12.5 MG per tablet Take 1 tablet by mouth in the morning.     zinc gluconate 50 MG tablet Take 50 mg by mouth daily.     albuterol (VENTOLIN HFA) 108 (90 Base) MCG/ACT inhaler Inhale 2 puffs into the lungs every 6 (six) hours as needed for wheezing or shortness of breath.     HYDROcodone-acetaminophen (NORCO/VICODIN) 5-325 MG tablet Take 1-2 tablets by mouth every 6 (six) hours as needed for moderate pain (pain score 4-6). 30 tablet 0   hyoscyamine (LEVSIN) 0.125 MG tablet Take 1 tablet (0.125 mg total) by mouth every 6 (six) hours as needed for cramping. 100 tablet 1   predniSONE (DELTASONE) 50 MG tablet Take 50 mg by mouth daily with breakfast.     promethazine (PHENERGAN) 25 MG tablet TAKE 1 TABLET BY MOUTH TWICE A DAY AS NEEDED 20 tablet 0   Risankizumab-rzaa (SKYRIZI [place]) Inject into the skin. 360 mg/2.4 ml Once every 8 weeks.     rivaroxaban (XARELTO) 20 MG TABS tablet Take 1 tablet (20 mg total) by mouth daily with supper. 30 tablet     No results found for this or any previous visit (from the past 48 hour(s)). No results found.  Review of Systems  All other systems reviewed and are negative.   Blood pressure 96/70, pulse 85, temperature 98.7 F (37.1 C),  resp. rate 16, SpO2 97%. Physical Exam  GENERAL: The patient is AO x3, in no acute distress. HEENT: Head is normocephalic and atraumatic. EOMI are intact. Mouth is well hydrated and without lesions. NECK: Supple. No masses LUNGS: Clear to auscultation. No presence of rhonchi/wheezing/rales. Adequate chest expansion HEART: RRR, normal s1 and s2. ABDOMEN: Soft, nontender, no guarding, no peritoneal signs, and nondistended. BS +. No masses. EXTREMITIES: Without any cyanosis, clubbing, rash, lesions or edema. NEUROLOGIC: AOx3, no focal motor deficit. SKIN: no jaundice, no rashes  Assessment/Plan  Elizabeth [name] is a 62 y.o. female presenting today with a past medical history of  ileocolonic Crohn's disease status post ileocecectomy and partial colectomy, DVT on Xarelto, recurrent C. Diff, hypertension, GERD, obesity, who presents for follow up of history of piecemeal polypectomy.  Will proceed with colonoscopy.  Dolores Frame, MD 11/08/2023, 10:14 AM

## 2023-11-08 NOTE — Transfer of Care (Signed)
Immediate Anesthesia Transfer of Care Note  Patient: Elizabeth [name]  Procedure(s) Performed: COLONOSCOPY WITH PROPOFOL BALLOON DILATION BIOPSY SUBMUCOSAL LIFTING INJECTION POLYPECTOMY HEMOSTASIS CLIP PLACEMENT  Patient Location: Short Stay  Anesthesia Type:General  Level of Consciousness: awake  Airway & Oxygen Therapy: Patient Spontanous Breathing  Post-op Assessment: Report given to RN and Post -op Vital signs reviewed and stable  Post vital signs: Reviewed and stable  Last Vitals:  Vitals Value Taken Time  BP    Temp 36.3 C 11/08/23 1156  Pulse 65 11/08/23 1156  Resp 17 11/08/23 1156  SpO2      Last Pain:  Vitals:   11/08/23 1156  TempSrc: Oral  PainSc:          Complications: No notable events documented.

## 2023-11-08 NOTE — Anesthesia Procedure Notes (Signed)
Date/Time: 11/08/2023 10:56 AM  Performed by: Julian Reil, CRNAPre-anesthesia Checklist: Patient identified, Emergency Drugs available, Suction available and Patient being monitored Patient Re-evaluated:Patient Re-evaluated prior to induction Oxygen Delivery Method: Nasal cannula Induction Type: IV induction Placement Confirmation: positive ETCO2 Comments: Optiflow High Flow [place] O2 used.

## 2023-11-08 NOTE — Op Note (Addendum)
[facility] Patient Name: Elizabeth [name] Procedure Date: 11/08/2023 7:20 AM MRN: [id number] Date of Birth: Oct 18, 1961 Attending MD: Katrinka Blazing , , [id number] CSN: [id number] Age: 62 Admit Type: Outpatient Procedure:                Colonoscopy Indications:              High risk colon cancer surveillance: Personal                            history of sessile serrated colon polyp (10 mm or                            greater in size) Providers:                Katrinka Blazing, Edrick Kins, RN, Zena Amos Referring MD:              Medicines:                Monitored Anesthesia Care Complications:            No immediate complications. Estimated Blood Loss:     Estimated blood loss: none. Procedure:                Pre-Anesthesia Assessment:                           - Prior to the procedure, a History and Physical                            was performed, and patient medications, allergies                            and sensitivities were reviewed. The patient's                            tolerance of previous anesthesia was reviewed.                           - The risks and benefits of the procedure and the                            sedation options and risks were discussed with the                            patient. All questions were answered and informed                            consent was obtained.                           - ASA Grade Assessment: III - A patient with severe                            systemic disease.                           After obtaining informed consent,  the colonoscope                            was passed under direct vision. Throughout the                            procedure, the patient's blood pressure, pulse, and                            oxygen saturations were monitored continuously. The                            [id number]-HQ190L ([id number]) scope was introduced through                            the anus and advanced to the 5 cm into  the neo                            terminal ileum. The colonoscopy was performed                            without difficulty. The patient tolerated the                            procedure well. The quality of the bowel                            preparation was fair. Scope In: 11:00:56 AM Scope Out: 11:49:52 AM Scope Withdrawal Time: 0 hours 39 minutes 2 seconds  Total Procedure Duration: 0 hours 48 minutes 56 seconds  Findings:      The perianal and digital rectal examinations were normal.      The neo-terminal ileum contained a benign-appearing, intrinsic moderate       stenosis measuring 2 cm (in length) x 8 mm (inner diameter) that was       traversed after dilation. A TTS dilator was passed through the scope.       Dilation with a 12-13.5-15 mm colonic balloon dilator was performed up       to 13.5 mm. The dilation site was examined and showed moderate       improvement in luminal narrowing.      A localized area of the neo-terminal ileum was congested. Biopsies were       taken with a cold forceps for histology.      There was evidence of a prior end-to-side ileo-colonic anastomosis in       the ascending colon. This was non-patent. The anastomosis was traversed       after dilation.      A 12 mm polyp was found in the transverse colon. The polyp was       semi-sessile. Area was successfully injected with 1 mL Eleview for a       lift polypectomy. Imaging was performed using white light and narrow       band imaging to visualize the mucosa and demarcate the polyp site after       injection for EMR purposes. The polyp was removed with a cold snare.  Resection and retrieval were complete. To prevent bleeding after the       polypectomy, two hemostatic clips were successfully placed. Clip       manufacturer: [employer]. There was no bleeding at the end of the       procedure.      A small post polypectomy scar was found in the descending colon. There       was  residual polypoid tissue. The polyp was removed with a cold snare.       Resection and retrieval were complete.      A tattoo was seen in the descending colon. The tattoo site appeared       normal.      A 2 mm polyp was found in the descending colon. The polyp was sessile.       The polyp was removed with a cold snare. Resection and retrieval were       complete.      Scattered medium-mouthed and small-mouthed diverticula were found in the       sigmoid colon and descending colon.      An area of moderately congested mucosa was found in the sigmoid colon.      There was evidence of a prior side-to-side colo-colonic anastomosis in       the recto-sigmoid colon. This was patent and was characterized by       healthy appearing mucosa. The anastomosis was traversed.      Non-bleeding internal hemorrhoids were found during retroflexion. The       hemorrhoids were small. Impression:               - Preparation of the colon was fair.                           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 Dilated.                           - Congested mucosa in the neo-terminal ileum.                            Biopsied.                           - Non-patent end-to-side ileo-colonic anastomosis.                           - One 12 mm polyp in the transverse colon, removed                            with a cold snare. Resected and retrieved via EMR.                            Clips were placed. Clip manufacturer: [employer].                           - Post-polypectomy scar in the descending colon.                           -  A tattoo was seen in the descending colon. The                            tattoo site appeared normal.                           - One 2 mm polyp in the descending colon, removed                            with a cold snare. Resected and retrieved.                           - Diverticulosis in the sigmoid colon and in the                             descending colon.                           - Congested mucosa in the sigmoid colon.                           - Patent side-to-side colo-colonic anastomosis,                            characterized by healthy appearing mucosa.                           - Non-bleeding internal hemorrhoids. Moderate Sedation:      Per Anesthesia Care Recommendation:           - Discharge patient to home (ambulatory).                           - Resume previous diet.                           - Await pathology results.                           - Repeat colonoscopy for surveillance based on                            pathology results.                           - Continue present medications.                           -Restart Xarelto tomorrow.                           - Smoking cessation. Procedure Code(s):        --- Professional ---                           [phone], 59, Colonoscopy, flexible; with removal of  tumor(s), polyp(s), or other lesion(s) by snare                            technique                           [phone], Colonoscopy, flexible; with transendoscopic                            balloon dilation                           [id number], 59, Colonoscopy, flexible; with biopsy,                            single or multiple                           45381, Colonoscopy, flexible; with directed                            submucosal injection(s), any substance Diagnosis Code(s):        --- Professional ---                           Z86.010, Personal history of colonic polyps                           K64.8, Other hemorrhoids                           K56.699, Other intestinal obstruction unspecified                            as to partial versus complete obstruction                           K63.89, Other specified diseases of intestine                           K91.89, Other postprocedural complications and                            disorders of digestive system                            Z98.0, Intestinal bypass and anastomosis status                           D12.3, Benign neoplasm of transverse colon (hepatic                            flexure or splenic flexure)                           D12.4, Benign neoplasm of descending colon  Z61.096, Other specified postprocedural states                           K57.30, Diverticulosis of large intestine without                            perforation or abscess without bleeding CPT copyright 2022 American Medical Association. All rights reserved. The codes documented in this report are preliminary and upon coder review may  be revised to meet current compliance requirements. Katrinka Blazing, MD Katrinka Blazing,  11/08/2023 12:03:04 PM This report has been signed electronically. Number of Addenda: 0

## 2023-11-08 NOTE — Discharge Instructions (Addendum)
You are being discharged to home.  Resume your previous diet.  We are waiting for your pathology results.  Your physician has recommended a repeat colonoscopy for surveillance based on pathology results.  Continue your present medications.  Restart Xarelto tomorrow.

## 2023-11-08 NOTE — Anesthesia Postprocedure Evaluation (Signed)
Anesthesia Post Note  Patient: Elizabeth [name]  Procedure(s) Performed: COLONOSCOPY WITH PROPOFOL BALLOON DILATION BIOPSY SUBMUCOSAL LIFTING INJECTION POLYPECTOMY HEMOSTASIS CLIP PLACEMENT  Patient location during evaluation: PACU Anesthesia Type: General Level of consciousness: awake and alert Pain management: pain level controlled Vital Signs Assessment: post-procedure vital signs reviewed and stable Respiratory status: spontaneous breathing, nonlabored ventilation, respiratory function stable and patient connected to nasal cannula oxygen Cardiovascular status: blood pressure returned to baseline and stable Postop Assessment: no apparent nausea or vomiting Anesthetic complications: no   There were no known notable events for this encounter.   Last Vitals:  Vitals:   11/08/23 1156 11/08/23 1202  BP: (!) 80/59 (!) 86/70  Pulse: 65   Resp: 17   Temp: (!) 36.3 C   SpO2: 100%     Last Pain:  Vitals:   11/08/23 1202  TempSrc:   PainSc: 0-No pain                 Laila Myhre L Shina Wass

## 2023-11-09 ENCOUNTER — Encounter (INDEPENDENT_AMBULATORY_CARE_PROVIDER_SITE_OTHER): Payer: Self-pay | Admitting: Gastroenterology

## 2023-11-09 LAB — SURGICAL PATHOLOGY

## 2023-11-15 ENCOUNTER — Encounter (HOSPITAL_COMMUNITY): Payer: Self-pay | Admitting: Gastroenterology

## 2023-11-15 ENCOUNTER — Encounter (INDEPENDENT_AMBULATORY_CARE_PROVIDER_SITE_OTHER): Payer: Self-pay | Admitting: *Deleted

## 2023-11-16 ENCOUNTER — Telehealth (INDEPENDENT_AMBULATORY_CARE_PROVIDER_SITE_OTHER): Payer: Self-pay | Admitting: *Deleted

## 2023-11-16 ENCOUNTER — Encounter (INDEPENDENT_AMBULATORY_CARE_PROVIDER_SITE_OTHER): Payer: Self-pay | Admitting: *Deleted

## 2023-11-16 NOTE — Telephone Encounter (Signed)
Fax from [place] assist. Application for [employer] assistance is approved through 12/12/24. Mychart message sent to patient letting her know.

## 2023-11-16 NOTE — Telephone Encounter (Signed)
error 

## 2023-11-20 ENCOUNTER — Other Ambulatory Visit (INDEPENDENT_AMBULATORY_CARE_PROVIDER_SITE_OTHER): Payer: Self-pay | Admitting: Gastroenterology

## 2023-11-20 ENCOUNTER — Other Ambulatory Visit: Payer: Self-pay | Admitting: Orthopaedic Surgery

## 2023-11-20 DIAGNOSIS — K50018 Crohn's disease of small intestine with other complication: Secondary | ICD-10-CM

## 2023-11-20 DIAGNOSIS — K50114 Crohn's disease of large intestine with abscess: Secondary | ICD-10-CM

## 2023-11-20 MED ORDER — PREDNISONE 10 MG PO TABS: ORAL_TABLET | ORAL | 0 refills | Status: AC

## 2023-11-20 MED ORDER — HYOSCYAMINE SULFATE ER 0.375 MG PO TB12: 0.3750 mg | ORAL_TABLET | Freq: Two times a day (BID) | ORAL | 2 refills | Status: AC | PRN

## 2023-12-21 ENCOUNTER — Ambulatory Visit: Payer: PPO | Admitting: Orthopaedic Surgery

## 2023-12-28 ENCOUNTER — Ambulatory Visit: Payer: PPO | Admitting: Orthopaedic Surgery

## 2023-12-28 ENCOUNTER — Telehealth: Payer: Self-pay | Admitting: Orthopaedic Surgery

## 2023-12-28 NOTE — Telephone Encounter (Signed)
 Dr. Vicente Graham pt - spoke w/the pt, she is requesting a refill on Hydrocodone  5-325 and would like Prednisone  sent to [facility].  She stated that with her spouse being down w/his hip she is doing all the stuff like shoveling snow/ice and she's really hurting.

## 2023-12-29 MED ORDER — PREDNISONE 5 MG (21) PO TBPK: ORAL_TABLET | ORAL | 0 refills | Status: DC

## 2023-12-29 MED ORDER — HYDROCODONE-ACETAMINOPHEN 5-325 MG PO TABS: 1.0000 | ORAL_TABLET | Freq: Four times a day (QID) | ORAL | 0 refills | Status: DC | PRN

## 2024-01-05 DIAGNOSIS — L820 Inflamed seborrheic keratosis: Secondary | ICD-10-CM | POA: Diagnosis not present

## 2024-01-11 ENCOUNTER — Ambulatory Visit (INDEPENDENT_AMBULATORY_CARE_PROVIDER_SITE_OTHER): Payer: HMO | Admitting: Orthopaedic Surgery

## 2024-01-11 ENCOUNTER — Encounter: Payer: Self-pay | Admitting: Orthopaedic Surgery

## 2024-01-11 VITALS — BP 149/77 | HR 61 | Ht 67.0 in | Wt 226.4 lb

## 2024-01-11 DIAGNOSIS — M7061 Trochanteric bursitis, right hip: Secondary | ICD-10-CM

## 2024-01-11 DIAGNOSIS — F17210 Nicotine dependence, cigarettes, uncomplicated: Secondary | ICD-10-CM | POA: Diagnosis not present

## 2024-01-11 DIAGNOSIS — M7062 Trochanteric bursitis, left hip: Secondary | ICD-10-CM

## 2024-01-11 MED ORDER — HYDROCODONE-ACETAMINOPHEN 5-325 MG PO TABS: 1.0000 | ORAL_TABLET | Freq: Four times a day (QID) | ORAL | 0 refills | Status: AC | PRN

## 2024-01-11 NOTE — Progress Notes (Signed)
I am a little better.  She had a flare up of hip pain after shoveling snow.  I called in prednisone and pain medicine and she is much better.  Both hips are tender but have full ROM.  She is walking well today. NV intact.  Encounter Diagnoses  Name Primary?   Trochanteric bursitis, left hip Yes   Trochanteric bursitis, right hip    Nicotine dependence, cigarettes, uncomplicated    I have reviewed the [place] Controlled Substance Reporting System web site prior to prescribing narcotic medicine for this patient.  Return in three months.  Continue exercises.  Call if any problem.  Precautions discussed.  Electronically Signed Darreld Mclean, MD 1/29/20259:43 AM

## 2024-01-13 ENCOUNTER — Encounter: Payer: Self-pay | Admitting: Internal Medicine

## 2024-01-19 ENCOUNTER — Encounter: Payer: Self-pay | Admitting: Orthopaedic Surgery

## 2024-02-14 ENCOUNTER — Encounter: Payer: Self-pay | Admitting: Sports Medicine

## 2024-02-14 ENCOUNTER — Other Ambulatory Visit: Payer: Self-pay

## 2024-02-14 ENCOUNTER — Ambulatory Visit (INDEPENDENT_AMBULATORY_CARE_PROVIDER_SITE_OTHER): Payer: PPO | Admitting: Sports Medicine

## 2024-02-14 DIAGNOSIS — S76019A Strain of muscle, fascia and tendon of unspecified hip, initial encounter: Secondary | ICD-10-CM

## 2024-02-14 DIAGNOSIS — R29898 Other symptoms and signs involving the musculoskeletal system: Secondary | ICD-10-CM | POA: Diagnosis not present

## 2024-02-14 DIAGNOSIS — M7061 Trochanteric bursitis, right hip: Secondary | ICD-10-CM | POA: Diagnosis not present

## 2024-02-14 DIAGNOSIS — M7062 Trochanteric bursitis, left hip: Secondary | ICD-10-CM

## 2024-02-14 MED ORDER — BETAMETHASONE SOD PHOS & ACET 6 (3-3) MG/ML IJ SUSP
6.0000 mg | INTRAMUSCULAR | Status: AC | PRN
  Administered 2024-02-14: 6 mg via INTRA_ARTICULAR

## 2024-02-14 MED ORDER — BUPIVACAINE HCL 0.25 % IJ SOLN: 2.0000 mL | INTRAMUSCULAR | Status: DC | PRN

## 2024-02-14 MED ORDER — LIDOCAINE HCL 1 % IJ SOLN: 2.0000 mL | INTRAMUSCULAR | Status: DC | PRN

## 2024-02-14 MED ORDER — LIDOCAINE HCL 1 % IJ SOLN
2.0000 mL | INTRAMUSCULAR | Status: AC | PRN
  Administered 2024-02-14: 2 mL

## 2024-02-14 MED ORDER — BETAMETHASONE SOD PHOS & ACET 6 (3-3) MG/ML IJ SUSP: 6.0000 mg | INTRAMUSCULAR | Status: DC | PRN

## 2024-02-14 MED ORDER — BUPIVACAINE HCL 0.25 % IJ SOLN
2.0000 mL | INTRAMUSCULAR | Status: AC | PRN
  Administered 2024-02-14: 2 mL via INTRA_ARTICULAR

## 2024-02-14 NOTE — Progress Notes (Addendum)
 Office Visit Note   Patient: Elizabeth [name]           Date of Birth: [birth year]           MRN: [id number] Visit Date: 02/14/2024              Requested by: Darreld Mclean, MD [address] [place],  [place] [address] PCP: Richardean Chimera, MD  Medical Resident, Sports Medicine Fellow - Attending Physician Addendum:   I have independently interviewed and examined the patient myself. I have discussed the above with the original author and agree with their documentation. My edits for correction/addition/clarification have been made, see any changes above and below.   In summary, very pleasant 64 year old female with bilateral hip pain and weakness.  Did review her MRI which shows mild trochanteric bursitis but more so atrophy and some undersurface tearing of the gluteal musculature, most specifically the gluteus minimus left greater than right hip.  She has had injections in the past which gave her temporary relief but is looking for a more long-term solution.  Her husband is having surgery in the near term, given this for pain relief we did perform ultrasound-guided greater trochanteric injection.  After this kicks in in a few days and her pain is improved, she will start her home exercise regimen to improve and strengthen her lateral hip abductors, and handout was provided today and I did review these in the room with her.  We will see her back in 1 month hopefully with improved pain and function.  We did discuss the role of extracorporeal shockwave therapy to help augment healing and may consider at that time.  She does have her Norco 5-325 mg which she can take for breakthrough pain.  Madelyn Brunner, DO Primary Care Sports Medicine Physician  [facility] - Orthopedics  Assessment & Plan: Visit Diagnoses:  1. Trochanteric bursitis, left hip   2. Trochanteric bursitis, right hip   3. Weakness of both hips   4. Tear of gluteus minimus tendon, unspecified laterality, initial encounter     Plan: Discussed with patient that given her MRI results, patient likely dealing with repeat flares up of the greater trochanteric bursal area due to glute medius/minimus inflammation.  Patient has significantly decreased strength and range of motion with abduction.  Suspect this is likely related to the pain and less likely due to her muscular/tendon atrophy and tears which is confirmed on MRI.  Discussed with patient that given her can proceed today with bilateral steroid injections of the lateral hip under US-guidance, performed today.  Patient does have a history of Crohn's and has been well controlled on Skyrizi and does not require steroids.  Patient states that the next time she is getting Cristy Folks will be next Friday.  Patient tolerated procedure well, patient was also given abductor strengthening exercises and advised to do these daily if possible at home. Will have patient follow-up in 1 month at that time and can trial shockwave therapy if she has improvement.  Follow-Up Instructions: Return in about 1 month (around 03/16/2024) for bilateral hips .   Orders:  Orders Placed This Encounter  Procedures  . Large Joint Inj: R greater trochanter  . Large Joint Inj: L greater trochanter  . Large Joint Inj  . Large Joint Inj  . US Guided Needle Placement - No Linked Charges   Meds ordered this encounter  Medications  . bupivacaine (MARCAINE) 0.25 % (with pres) injection 2 mL  .  bupivacaine (MARCAINE) 0.25 % (with pres) injection 2 mL  . lidocaine (XYLOCAINE) 1 % (with pres) injection 2 mL  . lidocaine (XYLOCAINE) 1 % (with pres) injection 2 mL  . betamethasone acetate-betamethasone sodium phosphate (CELESTONE) injection 6 mg  . betamethasone acetate-betamethasone sodium phosphate (CELESTONE) injection 6 mg    Procedures: Large Joint Inj: R greater trochanter on 02/14/2024 5:28 PM Indications: pain Details: 22 G 3.5 in needle, ultrasound-guided lateral approach Medications: 2 mL lidocaine  1 %; 2 mL bupivacaine 0.25 %; 6 mg betamethasone acetate-betamethasone sodium phosphate 6 (3-3) MG/ML Outcome: tolerated well, no immediate complications  US-Guided Greater Trochanteric Bursa Injection, Right After discussion on risks/benefits/indications and informed verbal consent was obtained, a timeout was performed. The patient was lying in lateral recumbent position on exam table.Using ultrasound guidance, the greater trochanter was identified.The area overlyingthe trochanteric bursa was thenprepped with Betadine and alcohol swabs. Following sterile precautions, ultrasound was reapplied to visualize needle guidance with a 22-gauge 3.5" needle utilizing an in-plane approach toinject the bursa with2:2:1lidocaine:bupivicaine:betamethasone.Delivery of the injectate was visualized into the region of hypoechoic fluid of the greater trochanteric bursa. Patient tolerated procedure well without immediate complications.     Procedure, treatment alternatives, risks and benefits explained, specific risks discussed. Consent was given by the patient. Immediately prior to procedure a time out was called to verify the correct patient, procedure, equipment, support staff and site/side marked as required. Patient was prepped and draped in the usual sterile fashion.    Large Joint Inj: L greater trochanter on 02/14/2024 5:28 PM Indications: pain Details: 22 G 3.5 in needle, ultrasound-guided lateral approach Medications: 2 mL lidocaine 1 %; 2 mL bupivacaine 0.25 %; 6 mg betamethasone acetate-betamethasone sodium phosphate 6 (3-3) MG/ML Outcome: tolerated well, no immediate complications  US-Guided Greater Trochanteric Bursa Injection, Left After discussion on risks/benefits/indications and informed verbal consent was obtained, a timeout was performed. The patient was lying in lateral recumbent position on exam table.Using ultrasound guidance, the greater trochanter was identified.The area overlyingthe  trochanteric bursa was thenprepped with Betadine and alcohol swabs. Following sterile precautions, ultrasound was reapplied to visualize needle guidance with a 22-gauge 3.5" needle utilizing an in-plane approach toinject the bursa with2:2:1lidocaine:bupivicaine:betamethasone.Delivery of the injectate was visualized into the region of hypoechoic fluid of the greater trochanteric bursa. Patient tolerated procedure well without immediate complications.   Procedure, treatment alternatives, risks and benefits explained, specific risks discussed. Consent was given by the patient. Immediately prior to procedure a time out was called to verify the correct patient, procedure, equipment, support staff and site/side marked as required. Patient was prepped and draped in the usual sterile fashion.      Clinical Data: No additional findings.   Subjective: Chief Complaint  Patient presents with  . Right Hip - Pain  . Left Hip - Pain    Patient presenting for bilateral hip pain.  Patient states that the pain is located on the lateral aspect of the posterior hips.  Patient denies any groin pain.  Patient notes that she has a history of trochanteric bursitis and used to get injections regularly.  Patient states that it has been over 3 months since she got her last injection but is not entirely sure when her last one was.  Patient does have Crohn's disease and does take [employer].  Patient notes that she has a husband who has cancer as well as upcoming hip surgery and that she is trying to get better in order to be able to take care of him.  Patient states that all her pain is located on the lateral aspect of her hip near the greater trochanteric area.  Patient states that is difficult for her to sleep on her sides.  Patient notes that she is able to walk but her gait is altered due to the pain.   Review of Systems   Objective:  Physical Exam  Ortho Exam Bilateral hip: Inspection reveals no gross  abnormalities of the bilateral hip.  There is significant tenderness to palpation along the trochanteric bursa/greater trochanteric area bilaterally.  No tenderness to palpation along the groin or over the gluteus medius minimus area.  Range of motion is decreased significantly with abduction bilaterally.  Hip flexion and hip extension appear appropriate.  Patient is unable to abduct against gravity due to pain.  With patient in bridge position, abduction against resistance does show relatively good strength.   Imaging: Independent review of MRI left hip shows no signs of tearing of the gluteus minimus and medius, does appear to show some tendonesis and atrophy of the gluteus min and med muscles. Some swelling of the bursa of the bursitis.  Upon my review there is undersurface tearing of the gluteus minimus left greater than right hip.  MR HIP LEFT WO CONTRAST CLINICAL DATA:  Chronic left hip pain. Suspected trochanteric bursitis.  EXAM: MR OF THE LEFT HIP WITHOUT CONTRAST  TECHNIQUE: Multiplanar, multisequence MR imaging was performed. No intravenous contrast was administered.  COMPARISON:  CT abdomen pelvis dated June 02, 2022. MRI left hip dated October 22, 2004.  FINDINGS: Bones: There is no evidence of acute fracture, dislocation or avascular necrosis. No focal bone lesion. The visualized sacroiliac joints and symphysis pubis appear normal.  Articular cartilage and labrum  Articular cartilage: Mild cartilage thinning in both hip joints. No subchondral signal abnormality identified.  Labrum: Grossly intact, although evaluation is limited due to lack of intra-articular fluid. No paralabral abnormality.  Joint or bursal effusion  Joint effusion: No significant hip joint effusion.  Bursae: Small amount of fluid in both greater trochanteric bursae.  Muscles and tendons  Muscles and tendons: The visualized gluteus, hamstring and iliopsoas tendons are intact. Mild bilateral  gluteal and hamstring tendinosis. Bilateral gluteus minimus and medius muscle atrophy. No muscle edema.  Other findings  Miscellaneous: Prior hysterectomy. The visualized internal pelvic contents appear unremarkable.  IMPRESSION: 1. Mild bilateral greater trochanteric bursitis. 2. Mild bilateral gluteal and hamstring tendinosis. 3. Mild bilateral hip osteoarthritis.  Electronically Signed   By: Obie Dredge M.D.   On: 02/09/2023 09:23     PMFS History: Patient Active Problem List   Diagnosis Date Noted  . History of colonic polyps 11/08/2023  . Long-term current use of immunosuppressive biologic agent 01/24/2023  . History of colon polyps 12/22/2022  . Vitamin D deficiency 01/21/2022  . Ileus (HCC) 12/24/2021  . Crohn's colitis (HCC) 12/21/2021  . Hypomagnesemia 12/21/2021  . LLQ abdominal pain   . Abdominal pain 10/06/2021  . Incisional hernia, without obstruction or gangrene   . Abnormal LFTs 07/14/2021  . Abdominal wall pain 03/31/2021  . Crohn's disease of both small and large intestine (HCC) 12/02/2020  . Diarrhea 09/02/2020  . C. difficile diarrhea 08/07/2020  . Back spasm 05/20/2020  . Rectal pain 11/20/2019  . Hemorrhoids 11/20/2019  . Crohn's disease (HCC) 04/19/2019  . Acute bronchitis 11/29/2017  . 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 Strain of back 04/08/2017  . Diverticulitis large intestine 01/07/2015  . Diverticulitis 07/03/2014  . Hypokalemia 07/03/2014  .  Tobacco abuse   . Obesity   . Upper airway cough syndrome 01/28/2014  . IBS (irritable bowel syndrome) 07/10/2013  . Acute bilateral lower abdominal pain 03/26/2013  . Tiredness 03/26/2013  . Sigmoid diverticulitis 09/12/2012  . Epigastric pain 05/22/2012  . GERD (gastroesophageal reflux disease) 01/10/2012  . HTN (hypertension) 01/10/2012   Past Medical History:  Diagnosis Date  . Arthritis   . Bronchitis   . C. difficile diarrhea   . Cancer (HCC)   . Cough 01/28/2014   upper airway  cough syndrome  . Crohn's disease (HCC)   . Diverticulitis   . DVT 06/2020   history of  . Elevated transaminase level   . GERD (gastroesophageal reflux disease)   . Hypertension   . Obesity   . Pneumonia 12/2013  . Tobacco abuse     Family History  Problem Relation Age of Onset  . Healthy Mother   . Lung cancer Father        smoked  . Allergic Disorder Brother   . Healthy Daughter     Past Surgical History:  Procedure Laterality Date  . ABDOMINAL HYSTERECTOMY  12/13/1978  . APPENDECTOMY    . BALLOON DILATION  11/08/2023   Procedure: BALLOON DILATION;  Surgeon: Marguerita Merles, Reuel Boom, MD;  [facility];  Service: Gastroenterology;;  . BIOPSY  11/11/2021   Procedure: BIOPSY;  Surgeon: Malissa Hippo, MD;  [facility]: [facility];  Service: Endoscopy;;  bx of polyp and small bowel  . BIOPSY  11/08/2023   Procedure: BIOPSY;  Surgeon: Dolores Frame, MD;  [facility]: [facility];  Service: Gastroenterology;;  . COLON SURGERY    . COLONOSCOPY    . COLONOSCOPY N/A 08/16/2014   Procedure: COLONOSCOPY;  Surgeon: Malissa Hippo, MD;  [facility]: [facility];  Service: Endoscopy;  Laterality: N/A;  1200-rescheduled to 9/4 @ 10:45 Ann notified pt  . COLONOSCOPY WITH PROPOFOL N/A 11/11/2021   Procedure: COLONOSCOPY WITH PROPOFOL;  Surgeon: Malissa Hippo, MD;  [facility]: [facility];  Service: Endoscopy;  Laterality: N/A;  2:05  . COLONOSCOPY WITH PROPOFOL N/A 12/22/2022   Procedure: COLONOSCOPY WITH PROPOFOL;  Surgeon: Dolores Frame, MD;  [facility];  Service: Gastroenterology;  Laterality: N/A;  11:15am, asa 1-2  . COLONOSCOPY WITH PROPOFOL N/A 11/08/2023   Procedure: COLONOSCOPY WITH PROPOFOL;  Surgeon: Dolores Frame, MD;  [facility]: [facility];  Service: Gastroenterology;  Laterality: N/A;  10:30AM;ASA 3  . ESOPHAGOGASTRODUODENOSCOPY  06/16/2012   Procedure: ESOPHAGOGASTRODUODENOSCOPY (EGD);  Surgeon: Malissa Hippo, MD;  [facility];  Service: Endoscopy;  Laterality: N/A;  10:30 AM  . hemocolectomy  12/14/2003   right  . HEMOSTASIS CLIP PLACEMENT  12/22/2022   Procedure: HEMOSTASIS CLIP PLACEMENT;  Surgeon: Dolores Frame, MD;  [facility];  Service: Gastroenterology;;  . HEMOSTASIS CLIP PLACEMENT  11/08/2023   Procedure: HEMOSTASIS CLIP PLACEMENT;  Surgeon: Dolores Frame, MD;  [facility];  Service: Gastroenterology;;  . Illeocecal Resection  2005  . INCISION AND DRAINAGE ABSCESS N/A 11/10/2022   Procedure: INCISION AND DRAINAGE ABSCESS, PERIANAL;  Surgeon: Franky Macho, MD;  Location: [facility];  Service: General;  Laterality: N/A;  . INCISIONAL HERNIA REPAIR N/A 08/24/2021   Procedure: HERNIA REPAIR INCISIONAL WITH MESH;  Surgeon: Franky Macho, MD;  Location: [facility];  Service: General;  Laterality: N/A;  . LAPAROSCOPIC SIGMOID COLECTOMY N/A 01/07/2015   Procedure: LOW ANTERIOR COLON RESECTION;  Surgeon: Claud Kelp, MD;  Location[facility];  Service: General;  Laterality: N/A;  . POLYPECTOMY  12/22/2022   Procedure: POLYPECTOMY;  Surgeon: Dolores Frame, MD;  [facility];  Service: Gastroenterology;;  . POLYPECTOMY  11/08/2023   Procedure: POLYPECTOMY;  Surgeon: Dolores Frame, MD;  [facility];  Service: Gastroenterology;;  . SHOULDER ARTHROSCOPY WITH LABRAL REPAIR Right 01/20/2016   Procedure: SHOULDER ARTHROSCOPY WITH LABRAL REPAIR;  Surgeon: Cammy Copa, MD;  Location: [facility] OR;  Service: Orthopedics;  Laterality: Right;  . SUBMUCOSAL LIFTING INJECTION  12/22/2022   Procedure: SUBMUCOSAL LIFTING INJECTION;  Surgeon: Dolores Frame, MD;  [facility];  Service: Gastroenterology;;  . Brooke Dare INJECTION  11/08/2023   Procedure: SUBMUCOSAL LIFTING INJECTION;  Surgeon: Dolores Frame, MD;  [facility];  Service: Gastroenterology;;  .  Sunnie Nielsen TATTOO INJECTION  12/22/2022   Procedure: SUBMUCOSAL TATTOO INJECTION;  Surgeon: Dolores Frame, MD;  [facility];  Service: Gastroenterology;;  . TONGUE SURGERY Left 2018   cancer removed   Social History   Occupational History  . Occupation: Wound Treatment Nurse    Employer: [employer]  Tobacco Use  . Smoking status: Every Day    Current packs/day: 0.25    Average packs/day: 0.3 packs/day for 40.0 years (10.0 ttl pk-yrs)    Types: Cigarettes    Passive exposure: Current  . Smokeless tobacco: Never  . Tobacco comments:    increase smoking , very nervous  Vaping Use  . Vaping status: Never Used  Substance and Sexual Activity  . Alcohol use: Not Currently    Alcohol/week: 0.0 standard drinks of alcohol    Comment: occ  . Drug use: No  . Sexual activity: Yes

## 2024-02-14 NOTE — Progress Notes (Signed)
 Patient says that she has had bursitis of both hips for the last few years. She says that walking any more than about 5 minutes will flare up her pain. She says that Prednisone has helped the most, and she has had several steroid injections into the bursa as well. She says that she takes Hydrocodone whenever the pain gets especially bad. Patient says that pain is on the sides of the hips and prevents her from being comfortable when trying to sleep on right and left sides, as well as on her back.

## 2024-02-22 DIAGNOSIS — K219 Gastro-esophageal reflux disease without esophagitis: Secondary | ICD-10-CM | POA: Diagnosis not present

## 2024-02-22 DIAGNOSIS — Z131 Encounter for screening for diabetes mellitus: Secondary | ICD-10-CM | POA: Diagnosis not present

## 2024-02-22 DIAGNOSIS — Z13220 Encounter for screening for lipoid disorders: Secondary | ICD-10-CM | POA: Diagnosis not present

## 2024-02-22 DIAGNOSIS — I10 Essential (primary) hypertension: Secondary | ICD-10-CM | POA: Diagnosis not present

## 2024-03-13 ENCOUNTER — Encounter: Payer: Self-pay | Admitting: Sports Medicine

## 2024-03-13 ENCOUNTER — Ambulatory Visit: Admitting: Sports Medicine

## 2024-03-13 DIAGNOSIS — S76012D Strain of muscle, fascia and tendon of left hip, subsequent encounter: Secondary | ICD-10-CM

## 2024-03-13 DIAGNOSIS — G8929 Other chronic pain: Secondary | ICD-10-CM

## 2024-03-13 DIAGNOSIS — M7062 Trochanteric bursitis, left hip: Secondary | ICD-10-CM

## 2024-03-13 DIAGNOSIS — M25551 Pain in right hip: Secondary | ICD-10-CM | POA: Diagnosis not present

## 2024-03-13 DIAGNOSIS — M25552 Pain in left hip: Secondary | ICD-10-CM

## 2024-03-13 DIAGNOSIS — M7061 Trochanteric bursitis, right hip: Secondary | ICD-10-CM

## 2024-03-13 DIAGNOSIS — S76011D Strain of muscle, fascia and tendon of right hip, subsequent encounter: Secondary | ICD-10-CM

## 2024-03-13 NOTE — Progress Notes (Signed)
 Patient says that she has not had much relief from the injections. She says that on the third and fourth days her right hip was very bothersome and she had to stop doing her exercises for that side. That pain did ease off some with pain medicine, and she says she has been doing her exercises for both hips consistently. She says that she still has the most trouble after she has been sitting, even if just for 5 minutes.

## 2024-03-13 NOTE — Progress Notes (Signed)
 Elizabeth [name] - 63 y.o. female MRN [id number]  Date of birth: 1961-03-27  Office Visit Note: Visit Date: 03/13/2024 PCP: Richardean Chimera, MD Referred by: Richardean Chimera, MD  Subjective: Chief Complaint  Patient presents with   Right Hip - Follow-up   Left Hip - Follow-up   HPI: Elizabeth [name] is a pleasant 63 y.o. female who presents today for follow-up of bilateral lateral hip pain.  We did proceed with US-guided bilateral greater trochanteric injections for the hips on 02/14/24 - unfortunately she did not receive much relief from these injections.  As a reminder, she has had previous relief from many injections in past, but this is now the second subsequent injection which has not helped much.  She does take hydrocodone-acetaminophen 5-325 mg 1 to 2 tablets every 6 hours as needed.  She is performing her hip exercises consistently, has much pain but has fatigability and weakness with these.  Did review her MRI which shows mild trochanteric bursitis but more so atrophy and some undersurface tearing of the gluteal musculature, most specifically the gluteus minimus left greater than right hip.   Pertinent ROS were reviewed with the patient and found to be negative unless otherwise specified above in HPI.   Assessment & Plan: Visit Diagnoses:  1. Chronic hip pain, bilateral   2. Tear of left gluteus medius tendon, subsequent encounter   3. Tear of right gluteus medius tendon, subsequent encounter   4. Trochanteric bursitis of both hips    Plan: Impression is chronic and progressive bilateral lateral hip pain in the setting of previous greater trochanteric bursitis but also with notable gluteal tendinosis with active knee as well as some undersurface partial tearing.  She has failed oral medication, 2 subsequent injections and still having quite bothersome pain.  We discussed all treatment options.  She would like to trial extracorporeal shockwave therapy, we did proceed with a treatment of  this for each lateral hip today. She will continue her home strengthening exercises for the hip on a daily basis. She will use her Norco 5-325 mg 1-2 tablets 6 to 8 hours as needed for severe pain.  I would like to see her back in about 1 week to repeat a second treatment and reevaluate her and see the degree of relief she has had, before discussing further chemo to benefit.  In the interim, I would like her to make an appointment with my surgeon, Dr. Steward Drone in about 3-4 weeks for possible surgical discussion if the above shockwave and other treatment modalities are not help for her. She is agreeable to this plan.  Follow-up: Return for f/u with Shon Baton in 7-10 days (B/l hips); make appt with [facility] for hips in 3-4 weeks.   Meds & Orders: No orders of the defined types were placed in this encounter.  No orders of the defined types were placed in this encounter.    Procedures: Procedure: ECSWT Indications:  Lateral hip pain with GT-bursitis and gluteal atrophy   Procedure Details Consent: Risks of procedure as well as the alternatives and risks of each were explained to the patient.  Verbal consent for procedure obtained. Time Out: Verified patient identification, verified procedure, site was marked, verified correct patient position. The area was cleaned with alcohol swab.     The left greater trochanteric region and gluteal musculature was targeted for Extracorporeal shockwave therapy.    Preset: Trochanteric Bursitis Power Level: 100 mJ Frequency: 12 Hz Impulse/cycles: 2500 Head size: Regular  The right greater trochanteric region and gluteal musculature was targeted for Extracorporeal shockwave therapy.    Preset: Trochanteric Bursitis Power Level: 100 mJ Frequency: 12 Hz Impulse/cycles: 2500 Head size: Regular   Patient tolerated procedure well without immediate complications.       Clinical History: No specialty comments available.  She reports that she has been smoking  cigarettes. She has a 10 pack-year smoking history. She has been exposed to tobacco smoke. She has never used smokeless tobacco. No results for input(s): "HGBA1C", "LABURIC" in the last 8760 hours.  Objective:    Physical Exam  Gen: Well-appearing, in no acute distress; non-toxic CV: Well-perfused. Warm.  Resp: Breathing unlabored on room air; no wheezing. Psych: Fluid speech in conversation; appropriate affect; normal thought process  Ortho Exam - Bilateral hips: + TTP over bilateral greater trochanteric region.  There is weakness with hip abduction bilaterally, antalgic gait present with Trendelenburg.  Imaging:  02/07/23: Narrative & Impression  CLINICAL DATA:  Chronic left hip pain. Suspected trochanteric bursitis.   EXAM: MR OF THE LEFT HIP WITHOUT CONTRAST   TECHNIQUE: Multiplanar, multisequence MR imaging was performed. No intravenous contrast was administered.   COMPARISON:  CT abdomen pelvis dated June 02, 2022. MRI left hip dated October 22, 2004.   FINDINGS: Bones: There is no evidence of acute fracture, dislocation or avascular necrosis. No focal bone lesion. The visualized sacroiliac joints and symphysis pubis appear normal.   Articular cartilage and labrum   Articular cartilage: Mild cartilage thinning in both hip joints. No subchondral signal abnormality identified.   Labrum: Grossly intact, although evaluation is limited due to lack of intra-articular fluid. No paralabral abnormality.   Joint or bursal effusion   Joint effusion: No significant hip joint effusion.   Bursae: Small amount of fluid in both greater trochanteric bursae.   Muscles and tendons   Muscles and tendons: The visualized gluteus, hamstring and iliopsoas tendons are intact. Mild bilateral gluteal and hamstring tendinosis. Bilateral gluteus minimus and medius muscle atrophy. No muscle edema.   Other findings   Miscellaneous: Prior hysterectomy. The visualized internal  pelvic contents appear unremarkable.   IMPRESSION: 1. Mild bilateral greater trochanteric bursitis. 2. Mild bilateral gluteal and hamstring tendinosis. 3. Mild bilateral hip osteoarthritis.     Electronically Signed   By: Obie Dredge M.D.   On: 02/09/2023 09:23    Past Medical/Family/Surgical/Social History: Medications & Allergies reviewed per EMR, new medications updated. Patient Active Problem List   Diagnosis Date Noted   History of colonic polyps 11/08/2023   Long-term current use of immunosuppressive biologic agent 01/24/2023   History of colon polyps 12/22/2022   Vitamin D deficiency 01/21/2022   Ileus (HCC) 12/24/2021   Crohn's colitis (HCC) 12/21/2021   Hypomagnesemia 12/21/2021   LLQ abdominal pain    Abdominal pain 10/06/2021   Incisional hernia, without obstruction or gangrene    Abnormal LFTs 07/14/2021   Abdominal wall pain 03/31/2021   Crohn's disease of both small and large intestine (HCC) 12/02/2020   Diarrhea 09/02/2020   C. difficile diarrhea 08/07/2020   Back spasm 05/20/2020   Rectal pain 11/20/2019   Hemorrhoids 11/20/2019   Crohn's disease (HCC) 04/19/2019   Acute bronchitis 11/29/2017   Primary insomnia 04/08/2017   Strain of back 04/08/2017   Diverticulitis large intestine 01/07/2015   Diverticulitis 07/03/2014   Hypokalemia 07/03/2014   Tobacco abuse    Obesity    Upper airway cough syndrome 01/28/2014   IBS (irritable bowel syndrome) 07/10/2013  Acute bilateral lower abdominal pain 03/26/2013   Tiredness 03/26/2013   Sigmoid diverticulitis 09/12/2012   Epigastric pain 05/22/2012   GERD (gastroesophageal reflux disease) 01/10/2012   HTN (hypertension) 01/10/2012   Past Medical History:  Diagnosis Date   Arthritis    Bronchitis    C. difficile diarrhea    Cancer (HCC)    Cough 01/28/2014   upper airway cough syndrome   Crohn's disease (HCC)    Diverticulitis    DVT 06/2020   history of   Elevated transaminase level     GERD (gastroesophageal reflux disease)    Hypertension    Obesity    Pneumonia 12/2013   Tobacco abuse    Family History  Problem Relation Age of Onset   Healthy Mother    Lung cancer Father        smoked   Allergic Disorder Brother    Healthy Daughter    Past Surgical History:  Procedure Laterality Date   ABDOMINAL HYSTERECTOMY  12/13/1978   APPENDECTOMY     BALLOON DILATION  11/08/2023   Procedure: BALLOON DILATION;  Surgeon: Dolores Frame, MD;  [facility];  Service: Gastroenterology;;   BIOPSY  11/11/2021   Procedure: BIOPSY;  Surgeon: Malissa Hippo, MD;  [facility];  Service: Endoscopy;;  bx of polyp and small bowel   BIOPSY  11/08/2023   Procedure: BIOPSY;  Surgeon: Dolores Frame, MD;  [facility];  Service: Gastroenterology;;   COLON SURGERY     COLONOSCOPY     COLONOSCOPY N/A 08/16/2014   Procedure: COLONOSCOPY;  Surgeon: Malissa Hippo, MD;  [facility];  Service: Endoscopy;  Laterality: N/A;  1200-rescheduled to 9/4 @ 10:45 Ann notified pt   COLONOSCOPY WITH PROPOFOL N/A 11/11/2021   Procedure: COLONOSCOPY WITH PROPOFOL;  Surgeon: Malissa Hippo, MD;  [facility];  Service: Endoscopy;  Laterality: N/A;  2:05   COLONOSCOPY WITH PROPOFOL N/A 12/22/2022   Procedure: COLONOSCOPY WITH PROPOFOL;  Surgeon: Dolores Frame, MD;  [facility];  Service: Gastroenterology;  Laterality: N/A;  11:15am, asa 1-2   COLONOSCOPY WITH PROPOFOL N/A 11/08/2023   Procedure: COLONOSCOPY WITH PROPOFOL;  Surgeon: Dolores Frame, MD;  [facility];  Service: Gastroenterology;  Laterality: N/A;  10:30AM;ASA 3   ESOPHAGOGASTRODUODENOSCOPY  06/16/2012   Procedure: ESOPHAGOGASTRODUODENOSCOPY (EGD);  Surgeon: Malissa Hippo, MD;  [facility];  Service: Endoscopy;  Laterality: N/A;  10:30 AM   hemocolectomy  12/14/2003   right   HEMOSTASIS CLIP PLACEMENT   12/22/2022   Procedure: HEMOSTASIS CLIP PLACEMENT;  Surgeon: Dolores Frame, MD;  [facility];  Service: Gastroenterology;;   HEMOSTASIS CLIP PLACEMENT  11/08/2023   Procedure: HEMOSTASIS CLIP PLACEMENT;  Surgeon: Dolores Frame, MD;  [facility];  Service: Gastroenterology;;   Illeocecal Resection  2005   INCISION AND DRAINAGE ABSCESS N/A 11/10/2022   Procedure: INCISION AND DRAINAGE ABSCESS, PERIANAL;  Surgeon: Franky Macho, MD;  [facility];  Service: General;  Laterality: N/A;   INCISIONAL HERNIA REPAIR N/A 08/24/2021   Procedure: HERNIA REPAIR INCISIONAL WITH MESH;  Surgeon: Franky Macho, MD;  [facility];  Service: General;  Laterality: N/A;   LAPAROSCOPIC SIGMOID COLECTOMY N/A 01/07/2015   Procedure: LOW ANTERIOR COLON RESECTION;  Surgeon: Claud Kelp, MD;  Location: [facility];  Service: General;  Laterality: N/A;   POLYPECTOMY  12/22/2022   Procedure: POLYPECTOMY;  Surgeon:  Dolores Frame, MD;  [facility];  Service: Gastroenterology;;   POLYPECTOMY  11/08/2023   Procedure: POLYPECTOMY;  Surgeon: Dolores Frame, MD;  [facility];  Service: Gastroenterology;;   SHOULDER ARTHROSCOPY WITH LABRAL REPAIR Right 01/20/2016   Procedure: SHOULDER ARTHROSCOPY WITH LABRAL REPAIR;  Surgeon: Cammy Copa, MD;  Location: [facility] OR;  Service: Orthopedics;  Laterality: Right;   SUBMUCOSAL LIFTING INJECTION  12/22/2022   Procedure: SUBMUCOSAL LIFTING INJECTION;  Surgeon: Dolores Frame, MD;  [facility];  Service: Gastroenterology;;   SUBMUCOSAL LIFTING INJECTION  11/08/2023   Procedure: SUBMUCOSAL LIFTING INJECTION;  Surgeon: Dolores Frame, MD;  [facility];  Service: Gastroenterology;;   SUBMUCOSAL TATTOO INJECTION  12/22/2022   Procedure: SUBMUCOSAL TATTOO INJECTION;  Surgeon: Dolores Frame, MD;  [facility];  Service:  Gastroenterology;;   TONGUE SURGERY Left 2018   cancer removed   Social History   Occupational History   Occupation: Wound Treatment Nurse    Employer: [employer]  Tobacco Use   Smoking status: Every Day    Current packs/day: 0.25    Average packs/day: 0.3 packs/day for 40.0 years (10.0 ttl pk-yrs)    Types: Cigarettes    Passive exposure: Current   Smokeless tobacco: Never   Tobacco comments:    increase smoking , very nervous  Vaping Use   Vaping status: Never Used  Substance and Sexual Activity   Alcohol use: Not Currently    Alcohol/week: 0.0 standard drinks of alcohol    Comment: occ   Drug use: No   Sexual activity: Yes

## 2024-03-19 ENCOUNTER — Ambulatory Visit (INDEPENDENT_AMBULATORY_CARE_PROVIDER_SITE_OTHER): Payer: PPO | Admitting: Gastroenterology

## 2024-03-22 ENCOUNTER — Encounter: Payer: Self-pay | Admitting: Sports Medicine

## 2024-03-22 ENCOUNTER — Ambulatory Visit: Admitting: Sports Medicine

## 2024-03-22 DIAGNOSIS — M7061 Trochanteric bursitis, right hip: Secondary | ICD-10-CM | POA: Diagnosis not present

## 2024-03-22 DIAGNOSIS — S76011D Strain of muscle, fascia and tendon of right hip, subsequent encounter: Secondary | ICD-10-CM | POA: Diagnosis not present

## 2024-03-22 DIAGNOSIS — M25552 Pain in left hip: Secondary | ICD-10-CM

## 2024-03-22 DIAGNOSIS — M25551 Pain in right hip: Secondary | ICD-10-CM

## 2024-03-22 DIAGNOSIS — G8929 Other chronic pain: Secondary | ICD-10-CM | POA: Diagnosis not present

## 2024-03-22 DIAGNOSIS — S76012D Strain of muscle, fascia and tendon of left hip, subsequent encounter: Secondary | ICD-10-CM

## 2024-03-22 DIAGNOSIS — M7062 Trochanteric bursitis, left hip: Secondary | ICD-10-CM | POA: Diagnosis not present

## 2024-03-22 NOTE — Progress Notes (Signed)
 Elizabeth [name] - 63 y.o. female MRN [id number]  Date of birth: [birth year]  Office Visit Note: Visit Date: 03/22/2024 PCP: Richardean Chimera, MD Referred by: Richardean Chimera, MD  Subjective: Chief Complaint  Patient presents with   Right Hip - Follow-up   Left Hip - Follow-up   HPI: Elizabeth [name] is a pleasant 63 y.o. female who presents today for chronic bilateral hip pain.  At last visit, we performed a trial of extracorporeal shockwave therapy for her lateral hip with greater trochanteric bursa and gluteal tendons.  She had an excellent response to the shockwave treatment, she states she feels "100% better."  Still does acknowledge some pain but has been able to sleep on that side, perform activities and has been painting her house without difficulty.  She is pleased with improvement.  She does continue her hydrocodone-acetaminophen 5-325 mg every 6 hours as needed. Happy to be off prednisone.  Pertinent ROS were reviewed with the patient and found to be negative unless otherwise specified above in HPI.   Assessment & Plan: Visit Diagnoses:  1. Chronic hip pain, bilateral   2. Tear of right gluteus medius tendon, subsequent encounter   3. Tear of left gluteus medius tendon, subsequent encounter   4. Trochanteric bursitis of both hips    Plan: Impression is chronic bilateral hip pain with a combination of MRI confirmed trochanteric bursitis and partial tearing/atrophy of the gluteus medius/minimus tendons.  She had significant relief of pain and function after 1 treatment of extracorporeal shockwave therapy, we did repeat this today for each lateral hip.  Given her vast improvement, I would like her to take a 4-6 weeks holiday from treatment and see what sort of cumulative benefit she has going forward. She will continue her Norco 5-325 mg 1-2 tablets 6 to 8 hours as needed for severe pain. If she is doing well, she may continue her HEP and follow-up as needed.  If she has a setback at any  point, she may return and we can consider additional ESWT treatments.   Follow-up: Return in about 4 weeks (around 04/19/2024), or if symptoms worsen or fail to improve.   Meds & Orders: No orders of the defined types were placed in this encounter.  No orders of the defined types were placed in this encounter.    Procedures: Procedure: ECSWT Indications:  Lateral hip pain with GT-bursitis and gluteal atrophy   Procedure Details Consent: Risks of procedure as well as the alternatives and risks of each were explained to the patient.  Verbal consent for procedure obtained. Time Out: Verified patient identification, verified procedure, site was marked, verified correct patient position. The area was cleaned with alcohol swab.     The left greater trochanteric region and gluteal musculature was targeted for Extracorporeal shockwave therapy.    Preset: Trochanteric Bursitis Power Level: 110 mJ Frequency: 12 Hz   Impulse/cycles: 2200 Head size: Regular   The right greater trochanteric region and gluteal musculature was targeted for Extracorporeal shockwave therapy.    Preset: Trochanteric Bursitis Power Level: 110 mJ Frequency: 12 Hz Impulse/cycles: 2200 Head size: Regular   Patient tolerated procedure well without immediate complications.      Clinical History: No specialty comments available.  She reports that she has been smoking cigarettes. She has a 10 pack-year smoking history. She has been exposed to tobacco smoke. She has never used smokeless tobacco. No results for input(s): "HGBA1C", "LABURIC" in the last 8760 hours.  Objective:  Physical Exam  Gen: Well-appearing, in no acute distress; non-toxic CV: Well-perfused. Warm.  Resp: Breathing unlabored on room air; no wheezing. Psych: Fluid speech in conversation; appropriate affect; normal thought process  Ortho Exam - Bilateral hips: Very mild TTP over the right greater than left greater trochanteric region but markedly  improved from previous visits.  The hips move fluidly without restriction.  Strength testing deferred today.  No redness swelling or effusion about the lateral hips.  Imaging: No results found.  Past Medical/Family/Surgical/Social History: Medications & Allergies reviewed per EMR, new medications updated. Patient Active Problem List   Diagnosis Date Noted   History of colonic polyps 11/08/2023   Long-term current use of immunosuppressive biologic agent 01/24/2023   History of colon polyps 12/22/2022   Vitamin D deficiency 01/21/2022   Ileus (HCC) 12/24/2021   Crohn's colitis (HCC) 12/21/2021   Hypomagnesemia 12/21/2021   LLQ abdominal pain    Abdominal pain 10/06/2021   Incisional hernia, without obstruction or gangrene    Abnormal LFTs 07/14/2021   Abdominal wall pain 03/31/2021   Crohn's disease of both small and large intestine (HCC) 12/02/2020   Diarrhea 09/02/2020   C. difficile diarrhea 08/07/2020   Back spasm 05/20/2020   Rectal pain 11/20/2019   Hemorrhoids 11/20/2019   Crohn's disease (HCC) 04/19/2019   Acute bronchitis 11/29/2017   Primary insomnia 04/08/2017   Strain of back 04/08/2017   Diverticulitis large intestine 01/07/2015   Diverticulitis 07/03/2014   Hypokalemia 07/03/2014   Tobacco abuse    Obesity    Upper airway cough syndrome 01/28/2014   IBS (irritable bowel syndrome) 07/10/2013   Acute bilateral lower abdominal pain 03/26/2013   Tiredness 03/26/2013   Sigmoid diverticulitis 09/12/2012   Epigastric pain 05/22/2012   GERD (gastroesophageal reflux disease) 01/10/2012   HTN (hypertension) 01/10/2012   Past Medical History:  Diagnosis Date   Arthritis    Bronchitis    C. difficile diarrhea    Cancer (HCC)    Cough 01/28/2014   upper airway cough syndrome   Crohn's disease (HCC)    Diverticulitis    DVT 06/2020   history of   Elevated transaminase level    GERD (gastroesophageal reflux disease)    Hypertension    Obesity    Pneumonia  12/2013   Tobacco abuse    Family History  Problem Relation Age of Onset   Healthy Mother    Lung cancer Father        smoked   Allergic Disorder Brother    Healthy Daughter    Past Surgical History:  Procedure Laterality Date   ABDOMINAL HYSTERECTOMY  12/13/1978   APPENDECTOMY     BALLOON DILATION  11/08/2023   Procedure: BALLOON DILATION;  Surgeon: Dolores Frame, MD;  [facility];  Service: Gastroenterology;;   BIOPSY  11/11/2021   Procedure: BIOPSY;  Surgeon: Malissa Hippo, MD;  [facility];  Service: Endoscopy;;  bx of polyp and small bowel   BIOPSY  11/08/2023   Procedure: BIOPSY;  Surgeon: Dolores Frame, MD;  [facility];  Service: Gastroenterology;;   COLON SURGERY     COLONOSCOPY     COLONOSCOPY N/A 08/16/2014   Procedure: COLONOSCOPY;  Surgeon: Malissa Hippo, MD;  [facility];  Service: Endoscopy;  Laterality: N/A;  1200-rescheduled to 9/4 @ 10:45 Ann notified pt   COLONOSCOPY WITH PROPOFOL N/A 11/11/2021   Procedure: COLONOSCOPY WITH PROPOFOL;  Surgeon: Malissa Hippo, MD;  [facility];  Service: Endoscopy;  Laterality: N/A;  2:05   COLONOSCOPY WITH PROPOFOL N/A 12/22/2022   Procedure: COLONOSCOPY WITH PROPOFOL;  Surgeon: Dolores Frame, MD;  [facility];  Service: Gastroenterology;  Laterality: N/A;  11:15am, asa 1-2   COLONOSCOPY WITH PROPOFOL N/A 11/08/2023   Procedure: COLONOSCOPY WITH PROPOFOL;  Surgeon: Dolores Frame, MD;  [facility];  Service: Gastroenterology;  Laterality: N/A;  10:30AM;ASA 3   ESOPHAGOGASTRODUODENOSCOPY  06/16/2012   Procedure: ESOPHAGOGASTRODUODENOSCOPY (EGD);  Surgeon: Malissa Hippo, MD;  [facility];  Service: Endoscopy;  Laterality: N/A;  10:30 AM   hemocolectomy  12/14/2003   right   HEMOSTASIS CLIP PLACEMENT  12/22/2022   Procedure: HEMOSTASIS CLIP PLACEMENT;  Surgeon: Dolores Frame, MD;  [facility];  Service: Gastroenterology;;   HEMOSTASIS CLIP PLACEMENT  11/08/2023   Procedure: HEMOSTASIS CLIP PLACEMENT;  Surgeon: Dolores Frame, MD;  [facility];  Service: Gastroenterology;;   Illeocecal Resection  2005   INCISION AND DRAINAGE ABSCESS N/A 11/10/2022   Procedure: INCISION AND DRAINAGE ABSCESS, PERIANAL;  Surgeon: Franky Macho, MD;  [facility];  Service: General;  Laterality: N/A;   INCISIONAL HERNIA REPAIR N/A 08/24/2021   Procedure: HERNIA REPAIR INCISIONAL WITH MESH;  Surgeon: Franky Macho, MD;  [facility];  Service: General;  Laterality: N/A;   LAPAROSCOPIC SIGMOID COLECTOMY N/A 01/07/2015   Procedure: LOW ANTERIOR COLON RESECTION;  Surgeon: Claud Kelp, MD;  Location: [facility];  Service: General;  Laterality: N/A;   POLYPECTOMY  12/22/2022   Procedure: POLYPECTOMY;  Surgeon: Dolores Frame, MD;  [facility];  Service: Gastroenterology;;   POLYPECTOMY  11/08/2023   Procedure: POLYPECTOMY;  Surgeon: Dolores Frame, MD;  [facility];  Service: Gastroenterology;;   SHOULDER ARTHROSCOPY WITH LABRAL REPAIR Right 01/20/2016   Procedure: SHOULDER ARTHROSCOPY WITH LABRAL REPAIR;  Surgeon: Cammy Copa, MD;  Location: [facility] OR;  Service: Orthopedics;  Laterality: Right;   SUBMUCOSAL LIFTING INJECTION  12/22/2022   Procedure: SUBMUCOSAL LIFTING INJECTION;  Surgeon: Dolores Frame, MD;  [facility];  Service: Gastroenterology;;   SUBMUCOSAL LIFTING INJECTION  11/08/2023   Procedure: SUBMUCOSAL LIFTING INJECTION;  Surgeon: Dolores Frame, MD;  [facility];  Service: Gastroenterology;;   SUBMUCOSAL TATTOO INJECTION  12/22/2022   Procedure: SUBMUCOSAL TATTOO INJECTION;  Surgeon: Dolores Frame, MD;  [facility];  Service: Gastroenterology;;   TONGUE SURGERY Left 2018   cancer removed   Social History    Occupational History   Occupation: Wound Treatment Nurse    Employer: [employer]  Tobacco Use   Smoking status: Every Day    Current packs/day: 0.25    Average packs/day: 0.3 packs/day for 40.0 years (10.0 ttl pk-yrs)    Types: Cigarettes    Passive exposure: Current   Smokeless tobacco: Never   Tobacco comments:    increase smoking , very nervous  Vaping Use   Vaping status: Never Used  Substance and Sexual Activity   Alcohol use: Not Currently    Alcohol/week: 0.0 standard drinks of alcohol    Comment: occ   Drug use: No   Sexual activity: Yes

## 2024-03-22 NOTE — Progress Notes (Signed)
 Patient says that her hips are feeling much better. She says every now and then she will feel them, but otherwise she has no pain at all. When asked how much better she feels, she says she feels 100% better. She is a bit sore from painting her house, but says that this soreness is separate from her initial hip pain.

## 2024-03-27 DIAGNOSIS — I10 Essential (primary) hypertension: Secondary | ICD-10-CM | POA: Diagnosis not present

## 2024-03-27 DIAGNOSIS — Z6834 Body mass index (BMI) 34.0-34.9, adult: Secondary | ICD-10-CM | POA: Diagnosis not present

## 2024-03-27 DIAGNOSIS — F331 Major depressive disorder, recurrent, moderate: Secondary | ICD-10-CM | POA: Diagnosis not present

## 2024-03-27 DIAGNOSIS — N3281 Overactive bladder: Secondary | ICD-10-CM | POA: Diagnosis not present

## 2024-03-27 DIAGNOSIS — F17210 Nicotine dependence, cigarettes, uncomplicated: Secondary | ICD-10-CM | POA: Diagnosis not present

## 2024-04-11 ENCOUNTER — Ambulatory Visit: Payer: PPO | Admitting: Orthopaedic Surgery

## 2024-04-11 DIAGNOSIS — Z6834 Body mass index (BMI) 34.0-34.9, adult: Secondary | ICD-10-CM | POA: Diagnosis not present

## 2024-04-11 DIAGNOSIS — R600 Localized edema: Secondary | ICD-10-CM | POA: Diagnosis not present

## 2024-04-18 DIAGNOSIS — Z1329 Encounter for screening for other suspected endocrine disorder: Secondary | ICD-10-CM | POA: Diagnosis not present

## 2024-04-18 DIAGNOSIS — Z131 Encounter for screening for diabetes mellitus: Secondary | ICD-10-CM | POA: Diagnosis not present

## 2024-05-03 DIAGNOSIS — Z6833 Body mass index (BMI) 33.0-33.9, adult: Secondary | ICD-10-CM | POA: Diagnosis not present

## 2024-05-03 DIAGNOSIS — R609 Edema, unspecified: Secondary | ICD-10-CM | POA: Diagnosis not present

## 2024-05-03 DIAGNOSIS — R0989 Other specified symptoms and signs involving the circulatory and respiratory systems: Secondary | ICD-10-CM | POA: Diagnosis not present

## 2024-05-03 DIAGNOSIS — G43109 Migraine with aura, not intractable, without status migrainosus: Secondary | ICD-10-CM | POA: Diagnosis not present

## 2024-05-08 ENCOUNTER — Telehealth (INDEPENDENT_AMBULATORY_CARE_PROVIDER_SITE_OTHER): Payer: Self-pay | Admitting: Gastroenterology

## 2024-05-08 ENCOUNTER — Ambulatory Visit (INDEPENDENT_AMBULATORY_CARE_PROVIDER_SITE_OTHER): Admitting: Gastroenterology

## 2024-05-08 ENCOUNTER — Encounter (INDEPENDENT_AMBULATORY_CARE_PROVIDER_SITE_OTHER): Payer: Self-pay | Admitting: Gastroenterology

## 2024-05-08 VITALS — BP 100/59 | HR 85 | Temp 98.2°F | Ht 67.0 in | Wt 220.1 lb

## 2024-05-08 DIAGNOSIS — K610 Anal abscess: Secondary | ICD-10-CM | POA: Diagnosis not present

## 2024-05-08 DIAGNOSIS — K50814 Crohn's disease of both small and large intestine with abscess: Secondary | ICD-10-CM | POA: Diagnosis not present

## 2024-05-08 DIAGNOSIS — K50114 Crohn's disease of large intestine with abscess: Secondary | ICD-10-CM | POA: Insufficient documentation

## 2024-05-08 MED ORDER — CIPROFLOXACIN HCL 500 MG PO TABS: 500.0000 mg | ORAL_TABLET | Freq: Two times a day (BID) | ORAL | 0 refills | Status: AC

## 2024-05-08 MED ORDER — METRONIDAZOLE 500 MG PO TABS: 500.0000 mg | ORAL_TABLET | Freq: Two times a day (BID) | ORAL | 0 refills | Status: AC

## 2024-05-08 NOTE — Telephone Encounter (Signed)
 Per CC note- Carlan, Fidel Huddle, NP  Rozann Cornell, LPN Can we see if they have any recent CBCs on her?  Contacted [facility]; they have a CBC from 10/2023. PCP office is going to fax it over to us 

## 2024-05-08 NOTE — Patient Instructions (Addendum)
 We will continue with skyrizi  every 8 weeks as you are doing I will obtain most recent labs from PCP to review Continue course of antibiotics you are on, let me know if abscess is not improving I will send a course of cipro  and flagyl  for you to have on hand in case of another flare, however, if this is a persistent issue, you need to make me aware Continue to work on smoking cessation  Follow up 6 months  It was a pleasure to see you today. I want to create trusting relationships with patients and provide genuine, compassionate, and quality care. I truly value your feedback! please be on the lookout for a survey regarding your visit with me today. I appreciate your input about our visit and your time in completing this!    Sheree Lalla L. Camreigh Michie, MSN, APRN, AGNP-C Adult-Gerontology Nurse Practitioner [facility]

## 2024-05-08 NOTE — Telephone Encounter (Signed)
 Carlan, Chelsea L, NP  Rozann Cornell, LPN She had CBC last in November which I reviewed, had some abnormalities of some of her infection markers, I would like to repeat the CBC w diff if she is okay with that given she is on Skyrizi  which can affect the immune system       Pt contacted. Pt is ok with having CBC w diff repeated at [facility]

## 2024-05-08 NOTE — Telephone Encounter (Signed)
 CBC lab under media

## 2024-05-08 NOTE — Progress Notes (Addendum)
 Referring Provider: Leesa Pulling, MD Primary Care Physician:  Leesa Pulling, MD Primary GI Physician: Sammi Crick   Chief Complaint  Patient presents with   Follow-up    Pt arrives for follow up. Pts feet are swollen and no providers can seem to figure out what is causing the swelling. Pt does take fluid pill and stopped eating salt.    HPI:   Elizabeth [name] is a 63 y.o. female with past medical history of ileocolonic Crohn's disease status post ileocecectomy and partial colectomy, DVT on Xarelto , recurrent C. Diff, hypertension, GERD, obesity   Patient presenting today for follow up of: Crohn's disease and perianal abscess   Last seen October 2024, at that time on skyrizi  3very 8 weeks, stools soft but formed, 2 stools per day. Currently smoking a Pack of cigarettes/2 months, trying to quit. Recent perianal abscess that resolved on its own   Recommended schedule colonoscopy, continue skyrizi , check TB and hep B, work on smoking cessation Hep B and TB quant negative in November 2024  Present:  Patient states her stomach is doing great. Having 1-2 stools per day that are usually semiformed. No abdominal or rectal bleeding. She had recent labs done with her PCP in the last few  months.   She is having some issues with perianal abscess, she notes she had some cipro  and flagyl  on hand which she started, plans to complete 7 days worth. Report this is a chronic, intermittent issue for her. Previous I&D in 2023, though most recent visit to the surgeon revealed no abscess present for drainage as she had taken antibiotics prior to seeing them (08/2023).   She notes that she will have pain, bleeding/discharge. Feeling much better since being on antibiotics.  No fevers or chills.   she has had some swelling to her LEs, cardiac issues and vascular issues have been ruled out. She is doing low sodium diet and takes lasix which she notes decreased fluid and weight but makes her feel bad. She has pain  in her legs. Has had ABI that was mildly abnormal earlier this month.   Patient does not want to take any vaccines at this time    Last Colonoscopy:10/2023 Preparation of the colon was fair.                           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 Dilated.                           - Congested mucosa in the neo-terminal ileum.                            Biopsied.                           - Non-patent end-to-side ileo-colonic anastomosis.                           - One 12 mm polyp in the transverse colon, removed                            with a cold snare. Resected and retrieved via EMR.  Clips were placed. Clip manufacturer: [employer].                           - Post-polypectomy scar in the descending colon.                           - A tattoo was seen in the descending colon. The                            tattoo site appeared normal.                           - One 2 mm polyp in the descending colon, removed                            with a cold snare. Resected and retrieved.                           - Diverticulosis in the sigmoid colon and in the                            descending colon.                           - Congested mucosa in the sigmoid colon.                           - Patent side-to-side colo-colonic anastomosis,                            characterized by healthy appearing mucosa.                           - Non-bleeding internal hemorrhoids.    A. SMALL BOWEL, BIOPSY: - Enteric mucosa showing nonspecific architectural inflammatory changes, compatible with anastomosis site - Negative for dysplasia malignancy  B. COLON, TRANSVERSE, POLYPECTOMY: - Tubular adenoma(s) - Negative for high-grade dysplasia or malignancy  C. COLON, DESCENDING, PREVIOUS POLYPECTOMY SCAR SITE, POLYPECTOMY: - Hyperplastic polyp(s) - Negative for dysplasia   Recommendations:  2 years colonoscopy  Filed  Weights   05/08/24 0907  Weight: 220 lb 1.6 oz (99.8 kg)     Past Medical History:  Diagnosis Date   Arthritis    Bronchitis    C. difficile diarrhea    Cancer (HCC)    Cough 01/28/2014   upper airway cough syndrome   Crohn's disease (HCC)    Diverticulitis    DVT 06/2020   history of   Elevated transaminase level    GERD (gastroesophageal reflux disease)    Hypertension    Obesity    Pneumonia 12/2013   Tobacco abuse     Past Surgical History:  Procedure Laterality Date   ABDOMINAL HYSTERECTOMY  12/13/1978   APPENDECTOMY     BALLOON DILATION  11/08/2023   Procedure: BALLOON DILATION;  Surgeon: Urban Garden, MD;  [facility];  Service: Gastroenterology;;   BIOPSY  11/11/2021   Procedure: BIOPSY;  Surgeon: Ruby Corporal, MD;  [facility];  Service: Endoscopy;;  bx of polyp and small bowel   BIOPSY  11/08/2023   Procedure: BIOPSY;  Surgeon: Urban Garden, MD;  [facility];  Service: Gastroenterology;;   COLON SURGERY     COLONOSCOPY     COLONOSCOPY N/A 08/16/2014   Procedure: COLONOSCOPY;  Surgeon: Ruby Corporal, MD;  [facility];  Service: Endoscopy;  Laterality: N/A;  1200-rescheduled to 9/4 @ 10:45 Ann notified pt   COLONOSCOPY WITH PROPOFOL  N/A 11/11/2021   Procedure: COLONOSCOPY WITH PROPOFOL ;  Surgeon: Ruby Corporal, MD;  [facility];  Service: Endoscopy;  Laterality: N/A;  2:05   COLONOSCOPY WITH PROPOFOL  N/A 12/22/2022   Procedure: COLONOSCOPY WITH PROPOFOL ;  Surgeon: Urban Garden, MD;  [facility];  Service: Gastroenterology;  Laterality: N/A;  11:15am, asa 1-2   COLONOSCOPY WITH PROPOFOL  N/A 11/08/2023   Procedure: COLONOSCOPY WITH PROPOFOL ;  Surgeon: Urban Garden, MD;  [facility];  Service: Gastroenterology;  Laterality: N/A;  10:30AM;ASA 3   ESOPHAGOGASTRODUODENOSCOPY  06/16/2012   Procedure: ESOPHAGOGASTRODUODENOSCOPY  (EGD);  Surgeon: Ruby Corporal, MD;  [facility];  Service: Endoscopy;  Laterality: N/A;  10:30 AM   hemocolectomy  12/14/2003   right   HEMOSTASIS CLIP PLACEMENT  12/22/2022   Procedure: HEMOSTASIS CLIP PLACEMENT;  Surgeon: Urban Garden, MD;  [facility];  Service: Gastroenterology;;   HEMOSTASIS CLIP PLACEMENT  11/08/2023   Procedure: HEMOSTASIS CLIP PLACEMENT;  Surgeon: Urban Garden, MD;  [facility];  Service: Gastroenterology;;   Illeocecal Resection  2005   INCISION AND DRAINAGE ABSCESS N/A 11/10/2022   Procedure: INCISION AND DRAINAGE ABSCESS, PERIANAL;  Surgeon: Alanda Allegra, MD;  Location: [facility];  Service: General;  Laterality: N/A;   INCISIONAL HERNIA REPAIR N/A 08/24/2021   Procedure: HERNIA REPAIR INCISIONAL WITH MESH;  Surgeon: Alanda Allegra, MD;  Location: [facility];  Service: General;  Laterality: N/A;   LAPAROSCOPIC SIGMOID COLECTOMY N/A 01/07/2015   Procedure: LOW ANTERIOR COLON RESECTION;  Surgeon: Boyce Byes, MD;  [facility];  Service: General;  Laterality: N/A;   POLYPECTOMY  12/22/2022   Procedure: POLYPECTOMY;  Surgeon: Urban Garden, MD;  [facility];  Service: Gastroenterology;;   POLYPECTOMY  11/08/2023   Procedure: POLYPECTOMY;  Surgeon: Urban Garden, MD;  [facility];  Service: Gastroenterology;;   SHOULDER ARTHROSCOPY WITH LABRAL REPAIR Right 01/20/2016   Procedure: SHOULDER ARTHROSCOPY WITH LABRAL REPAIR;  Surgeon: Jasmine Mesi, MD;  Location: [facility] OR;  Service: Orthopedics;  Laterality: Right;   SUBMUCOSAL LIFTING INJECTION  12/22/2022   Procedure: SUBMUCOSAL LIFTING INJECTION;  Surgeon: Urban Garden, MD;  [facility];  Service: Gastroenterology;;   SUBMUCOSAL LIFTING INJECTION  11/08/2023   Procedure: SUBMUCOSAL LIFTING INJECTION;  Surgeon: Urban Garden, MD;  [facility];  Service:  Gastroenterology;;   SUBMUCOSAL TATTOO INJECTION  12/22/2022   Procedure: SUBMUCOSAL TATTOO INJECTION;  Surgeon: Umberto Ganong, Bearl Limes, MD;  [facility];  Service: Gastroenterology;;   TONGUE SURGERY Left 2018   cancer removed    Current Outpatient Medications  Medication Sig Dispense Refill   albuterol  (VENTOLIN  HFA) 108 (90 Base) MCG/ACT inhaler Inhale 2 puffs into the lungs every 6 (six) hours as needed for wheezing or shortness  of breath.     Ascorbic Acid (VITAMIN C) 1000 MG tablet Take 1,000 mg by mouth every evening.     Cholecalciferol (VITAMIN D3) 50 MCG (2000 UT) TABS Take 2,000 Units by mouth every evening.     HYDROcodone -acetaminophen  (NORCO/VICODIN) 5-325 MG tablet Take 1-2 tablets by mouth every 6 (six) hours as needed for moderate pain (pain score 4-6). 30 tablet 0   hyoscyamine  (LEVBID) 0.375 MG 12 hr tablet Take 1 tablet (0.375 mg total) by mouth every 12 (twelve) hours as needed (abdominal pain). 60 tablet 2   omeprazole  (PRILOSEC) 40 MG capsule Take 40 mg by mouth daily before breakfast.     potassium chloride  (MICRO-K ) 10 MEQ CR capsule Take 10 mEq by mouth 2 (two) times daily.      predniSONE  (STERAPRED UNI-PAK 21 TAB) 5 MG (21) TBPK tablet Take 6 pills first day; 5 pills second day; 4 pills third day; 3 pills fourth day; 2 pills next day and 1 pill last day. 21 tablet 0   promethazine  (PHENERGAN ) 25 MG tablet TAKE 1 TABLET BY MOUTH TWICE A DAY AS NEEDED 20 tablet 0   Risankizumab -rzaa (SKYRIZI  [place]) Inject into the skin. 360 mg/2.4 ml Once every 8 weeks.     rivaroxaban  (XARELTO ) 20 MG TABS tablet Take 1 tablet (20 mg total) by mouth daily with supper. 30 tablet    valsartan -hydrochlorothiazide  (DIOVAN -HCT) 160-12.5 MG per tablet Take 1 tablet by mouth in the morning.     zinc gluconate 50 MG tablet Take 50 mg by mouth daily.     No current facility-administered medications for this visit.    Allergies as of 05/08/2024 - Review Complete 05/08/2024   Allergen Reaction Noted   Contrast media [iodinated contrast media] Hives and Rash 01/10/2012    Social History   Socioeconomic History   Marital status: Married    Spouse name: Not on file   Number of children: Not on file   Years of education: Not on file   Highest education level: Not on file  Occupational History   Occupation: Wound Treatment Nurse    Employer: [employer]  Tobacco Use   Smoking status: Every Day    Current packs/day: 0.25    Average packs/day: 0.3 packs/day for 40.0 years (10.0 ttl pk-yrs)    Types: Cigarettes    Passive exposure: Current   Smokeless tobacco: Never   Tobacco comments:    increase smoking , very nervous  Vaping Use   Vaping status: Never Used  Substance and Sexual Activity   Alcohol use: Not Currently    Alcohol/week: 0.0 standard drinks of alcohol    Comment: occ   Drug use: No   Sexual activity: Yes  Other Topics Concern   Not on file  Social History Narrative   Not on file   Social Drivers of Health   Financial Resource Strain: Low Risk  (06/06/2020)   Received from [facility], [facility]   Overall Financial Resource Strain (CARDIA)    Difficulty of Paying Living Expenses: Not hard at all  Food Insecurity: No Food Insecurity (06/06/2020)   Received from [facility]   Hunger Vital Sign    Worried About Running Out of Food in the Last Year: Never true    Ran Out of Food in the Last Year: Never true  Transportation Needs: No Transportation Needs (06/06/2020)   Received from [facility], [facility]   [facility] - Transportation  Lack of Transportation (Medical): No    Lack of Transportation (Non-Medical): No  Physical Activity: Unknown (06/06/2020)   Received from [facility], [facility]   Exercise Vital Sign    Days of Exercise per Week: Not on file    Minutes of Exercise per Session: 0 min  Stress: No Stress Concern Present (06/06/2020)   Received from [place], [facility] - Occupational Stress Questionnaire    Feeling of Stress : Not at all  Social Connections: Socially Integrated (06/06/2020)   Received from [facility], [facility]   Social Connection and Isolation Panel [NHANES]    Frequency of Communication with Friends and Family: More than three times a week    Frequency of Social Gatherings with Friends and Family: More than three times a week    Attends Religious Services: More than 4 times per year    Active Member of Clubs or Organizations: Yes    Attends Engineer, structural: More than 4 times per year    Marital Status: Married    Review of systems General: negative for malaise, night sweats, fever, chills, weight loss Neck: Negative for lumps, goiter, pain and significant neck swelling Resp: Negative for cough, wheezing, dyspnea at rest CV: Negative for chest pain, leg swelling, palpitations, orthopnea GI: denies melena, hematochezia, nausea, vomiting, diarrhea, constipation, dysphagia, odyonophagia, early satiety or unintentional weight loss. +perianal abscess  MSK: Negative for joint pain or swelling, back pain, and muscle pain. Derm: Negative for itching or rash Psych: Denies depression, anxiety, memory loss, confusion. No homicidal or suicidal ideation.  Heme: Negative for prolonged bleeding, bruising easily, and swollen nodes. Endocrine: Negative for cold or heat intolerance, polyuria, polydipsia and goiter. Neuro: negative for tremor, gait imbalance, syncope and seizures. The remainder of the review of systems is noncontributory.  Physical Exam: BP (!) 100/59   Pulse 85   Temp 98.2 F (36.8 C)   Ht 5\' 7"  (1.702 m)   Wt 220 lb 1.6 oz (99.8 kg)   BMI 34.47 kg/m  General:   Alert and oriented. No distress noted. Pleasant and cooperative.  Head:  Normocephalic and atraumatic. Eyes:  Conjuctiva clear without scleral icterus. Mouth:  Oral mucosa pink and moist.  Good dentition. No lesions. Heart: Normal rate and rhythm, s1 and s2 heart sounds present.  Lungs: Clear lung sounds in all lobes. Respirations equal and unlabored. Abdomen:  +BS, soft, non-tender and non-distended. No rebound or guarding. No HSM or masses noted. Rectal: patient declined  Derm: No palmar erythema or jaundice Msk:  Symmetrical without gross deformities. Normal posture. Extremities:  non pitting edema present to bilateral LEs  Neurologic:  Alert and  oriented x4 Psych:  Alert and cooperative. Normal mood and affect.  Invalid input(s): "6 MONTHS"   ASSESSMENT: Elizabeth [name] is a 63 y.o. female presenting today for follow up of Crohn's disease and perianal abscess  Crohn's disease: ileocolonic Crohn's disease status post ileocecectomy and partial colectomy, currently doing well on Skyrizi  every 8 weeks with most recent colonoscopy with only mild inflammation present at her anastomosis site.  Having 1-2 bowel movements per day with no belly pain or rectal bleeding.  She is up-to-date on TB and hepatitis B screening as of November 2024.  She reports recent blood work done with her PCP in the last few months which we will obtain For records.  Will continue with Skyrizi  every 8 weeks  for now  Recurrent perianal abscess: History of I&D in 2023 then most recent visit to surgeon in September 2024 without appreciable lesion to drain.  She reports feeling recurrence of abscess over the weekend, she had a course of Cipro  and Flagyl  on hand which she started and feels that she has had near resolution of symptoms.  She declines rectal exam today. she does request to have refill of Cipro  and Flagyl  on hand in case she has recurrence as symptoms typically occur over the weekend and she does not want to be without medication to start.  I will provide a short course of Cipro  and Flagyl  for her to have on hand, though I did have advise her she needs to let me know if she feels she does not have complete  resolution of symptoms or if she does have recurrence as she may need to have reevaluation by general surgery.   PLAN:  -Continue Skyrizi  every 8 weeks -Smoking cessation -Complete course of Cipro  and Flagyl , patient to make me aware if symptoms do not completely resolve -Refill of Cipro  and Flagyl  x 7 days, on hand for recurrence of perianal abscess though patient may need reevaluation by surgeon if this persist -Will obtain most recent labs with PCP to have for our records  All questions were answered, patient verbalized understanding and is in agreement with plan as outlined above.   Follow Up: 6 months  Mearle Drew L. Adrien Alberta, MSN, APRN, AGNP-C Adult-Gerontology Nurse Practitioner [facility]  I have reviewed the note and agree with the APP's assessment as described in this progress note  Samantha Cress, MD Gastroenterology and Hepatology [facility]

## 2024-05-09 ENCOUNTER — Other Ambulatory Visit (INDEPENDENT_AMBULATORY_CARE_PROVIDER_SITE_OTHER): Payer: Self-pay | Admitting: Gastroenterology

## 2024-05-09 DIAGNOSIS — G43109 Migraine with aura, not intractable, without status migrainosus: Secondary | ICD-10-CM | POA: Diagnosis not present

## 2024-05-09 DIAGNOSIS — Z79899 Other long term (current) drug therapy: Secondary | ICD-10-CM

## 2024-05-09 DIAGNOSIS — K50114 Crohn's disease of large intestine with abscess: Secondary | ICD-10-CM

## 2024-05-09 DIAGNOSIS — Z79620 Long term (current) use of immunosuppressive biologic: Secondary | ICD-10-CM

## 2024-05-09 DIAGNOSIS — Z6833 Body mass index (BMI) 33.0-33.9, adult: Secondary | ICD-10-CM | POA: Diagnosis not present

## 2024-05-09 NOTE — Telephone Encounter (Signed)
 Do I need to put the order in? Please advise. Thank you!

## 2024-05-09 NOTE — Telephone Encounter (Signed)
 Noted. Thank you! Pt is aware to go to [facility]

## 2024-06-20 ENCOUNTER — Telehealth (INDEPENDENT_AMBULATORY_CARE_PROVIDER_SITE_OTHER): Payer: Self-pay

## 2024-06-20 NOTE — Telephone Encounter (Signed)
 I did give Lauren authorization to send the patient an new Skyrizi  kit to replace the lost one.

## 2024-06-20 NOTE — Telephone Encounter (Signed)
 [facility] called [phone] today stating the needed a one time script for the patient Skyrizi  on body, as the patient had a lose or malfunction of her on body medication. They are willing to give her a free sample to replace the lost one. I called and spoke with Lauren the pharmacist there. She will reach out to the patient to set up shipment to the patient home of the replacement.

## 2024-07-04 NOTE — Telephone Encounter (Signed)
 SABRA

## 2024-08-03 ENCOUNTER — Encounter: Payer: Self-pay | Admitting: Radiology

## 2024-08-08 DIAGNOSIS — Z13220 Encounter for screening for lipoid disorders: Secondary | ICD-10-CM | POA: Diagnosis not present

## 2024-08-08 DIAGNOSIS — Z1329 Encounter for screening for other suspected endocrine disorder: Secondary | ICD-10-CM | POA: Diagnosis not present

## 2024-08-08 DIAGNOSIS — Z131 Encounter for screening for diabetes mellitus: Secondary | ICD-10-CM | POA: Diagnosis not present

## 2024-08-08 DIAGNOSIS — E559 Vitamin D deficiency, unspecified: Secondary | ICD-10-CM | POA: Diagnosis not present

## 2024-08-15 DIAGNOSIS — I10 Essential (primary) hypertension: Secondary | ICD-10-CM | POA: Diagnosis not present

## 2024-08-15 DIAGNOSIS — N3281 Overactive bladder: Secondary | ICD-10-CM | POA: Diagnosis not present

## 2024-08-15 DIAGNOSIS — F331 Major depressive disorder, recurrent, moderate: Secondary | ICD-10-CM | POA: Diagnosis not present

## 2024-08-15 DIAGNOSIS — F17210 Nicotine dependence, cigarettes, uncomplicated: Secondary | ICD-10-CM | POA: Diagnosis not present

## 2024-08-15 DIAGNOSIS — G43109 Migraine with aura, not intractable, without status migrainosus: Secondary | ICD-10-CM | POA: Diagnosis not present

## 2024-08-15 DIAGNOSIS — Z1389 Encounter for screening for other disorder: Secondary | ICD-10-CM | POA: Diagnosis not present

## 2024-08-15 DIAGNOSIS — Z6831 Body mass index (BMI) 31.0-31.9, adult: Secondary | ICD-10-CM | POA: Diagnosis not present

## 2024-08-15 DIAGNOSIS — Z0001 Encounter for general adult medical examination with abnormal findings: Secondary | ICD-10-CM | POA: Diagnosis not present

## 2024-08-15 DIAGNOSIS — Z1331 Encounter for screening for depression: Secondary | ICD-10-CM | POA: Diagnosis not present

## 2024-09-04 ENCOUNTER — Encounter (INDEPENDENT_AMBULATORY_CARE_PROVIDER_SITE_OTHER): Payer: Self-pay | Admitting: Gastroenterology

## 2024-09-20 ENCOUNTER — Ambulatory Visit (INDEPENDENT_AMBULATORY_CARE_PROVIDER_SITE_OTHER): Admitting: Gastroenterology

## 2024-09-26 ENCOUNTER — Encounter (INDEPENDENT_AMBULATORY_CARE_PROVIDER_SITE_OTHER): Payer: Self-pay | Admitting: Gastroenterology

## 2024-10-01 ENCOUNTER — Encounter (INDEPENDENT_AMBULATORY_CARE_PROVIDER_SITE_OTHER): Payer: Self-pay | Admitting: Gastroenterology

## 2024-10-01 ENCOUNTER — Ambulatory Visit (INDEPENDENT_AMBULATORY_CARE_PROVIDER_SITE_OTHER): Admitting: Gastroenterology

## 2024-10-01 VITALS — BP 106/55 | HR 62 | Temp 97.8°F | Ht 67.0 in | Wt 203.0 lb

## 2024-10-01 DIAGNOSIS — Z1159 Encounter for screening for other viral diseases: Secondary | ICD-10-CM

## 2024-10-01 DIAGNOSIS — K50818 Crohn's disease of both small and large intestine with other complication: Secondary | ICD-10-CM | POA: Diagnosis not present

## 2024-10-01 DIAGNOSIS — Z13820 Encounter for screening for osteoporosis: Secondary | ICD-10-CM | POA: Diagnosis not present

## 2024-10-01 DIAGNOSIS — Z111 Encounter for screening for respiratory tuberculosis: Secondary | ICD-10-CM | POA: Diagnosis not present

## 2024-10-01 DIAGNOSIS — Z1321 Encounter for screening for nutritional disorder: Secondary | ICD-10-CM

## 2024-10-01 DIAGNOSIS — Z79899 Other long term (current) drug therapy: Secondary | ICD-10-CM

## 2024-10-01 DIAGNOSIS — F17210 Nicotine dependence, cigarettes, uncomplicated: Secondary | ICD-10-CM

## 2024-10-01 NOTE — Progress Notes (Signed)
 Toribio Fortune, M.D. Gastroenterology & Hepatology [facility] Gastroenterology [address] [place], [place] [address]  Primary Care Physician: Toribio Jerel MATSU, MD [address] [place] [address]  I will communicate my assessment and recommendations to the referring MD via EMR.  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 female with past medical history of ileocolonic Crohn's disease status post ileocecectomy and partial colectomy, DVT on Xarelto , recurrent C. Diff, hypertension, GERD, obesity  who presents for follow up of Crohn's disease.  The patient was last seen on 05/08/2024. At that time, the patient was continued on Skyrizi  every 8 weeks and was advised to quit smoking.  She was given a course of ciprofloxacin  and Flagyl  for her current perianal abscess.  Patient may have 1-2 Bms per day. States feeling well. When she gets stressed, she feels nauseated. Sometimes feels she has some pressure in the perianal area but does not think she has had a new abscess. The patient denies having any  vomiting, fever, chills, hematochezia, melena, hematemesis, abdominal distention, abdominal pain, diarrhea, jaundice, pruritus or weight loss.  Husband has been sick and she went back on smoking. States stress led her to smoke again.  Last flu shot:never Last pneumonia shot:possibly 2022 Last evaluation by dermatology: never, but will plan to schedule an appointment with Dr. Shona Last zoster vaccine: possibly in 2022 Last DEXA scan: never COVID-19 shot: never Last time TB and hepatitis B surface antigen were checked: 10/20/23   Last Colonoscopy:10/2023 Preparation of the colon was fair.                           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 Dilated.                           - Congested mucosa in the  neo-terminal ileum.                            Biopsied.                           - Non-patent end-to-side ileo-colonic anastomosis.                           - One 12 mm polyp in the transverse colon, removed                            with a cold snare. Resected and retrieved via EMR.                            Clips were placed. Clip manufacturer: [employer].                           - Post-polypectomy scar in the descending colon.                           -  A tattoo was seen in the descending colon. The                            tattoo site appeared normal.                           - One 2 mm polyp in the descending colon, removed                            with a cold snare. Resected and retrieved.                           - Diverticulosis in the sigmoid colon and in the                            descending colon.                           - Congested mucosa in the sigmoid colon.                           - Patent side-to-side colo-colonic anastomosis,                            characterized by healthy appearing mucosa.                           - Non-bleeding internal hemorrhoids.    A. SMALL BOWEL, BIOPSY: - Enteric mucosa showing nonspecific architectural inflammatory changes, compatible with anastomosis site - Negative for dysplasia malignancy  B. COLON, TRANSVERSE, POLYPECTOMY: - Tubular adenoma(s) - Negative for high-grade dysplasia or malignancy  C. COLON, DESCENDING, PREVIOUS POLYPECTOMY SCAR SITE, POLYPECTOMY: - Hyperplastic polyp(s) - Negative for dysplasia    Recommendations:  2 years colonoscopy  Past Medical History: Past Medical History:  Diagnosis Date   Arthritis    Bronchitis    C. difficile diarrhea    Cancer (HCC)    Cough 01/28/2014   upper airway cough syndrome   Crohn's disease (HCC)    Diverticulitis    DVT 06/2020   history of   Elevated transaminase level    GERD (gastroesophageal reflux disease)     Hypertension    Obesity    Pneumonia 12/2013   Tobacco abuse     Past Surgical History: Past Surgical History:  Procedure Laterality Date   ABDOMINAL HYSTERECTOMY  12/13/1978   APPENDECTOMY     BALLOON DILATION  11/08/2023   Procedure: BALLOON DILATION;  Surgeon: Eartha Angelia Sieving, MD;  [facility];  Service: Gastroenterology;;   BIOPSY  11/11/2021   Procedure: BIOPSY;  Surgeon: Golda Claudis PENNER, MD;  [facility];  Service: Endoscopy;;  bx of polyp and small bowel   BIOPSY  11/08/2023   Procedure: BIOPSY;  Surgeon: Eartha Angelia Sieving, MD;  [facility];  Service: Gastroenterology;;   COLON SURGERY     COLONOSCOPY     COLONOSCOPY N/A 08/16/2014   Procedure: COLONOSCOPY;  Surgeon: Claudis PENNER Golda, MD;  [facility];  Service: Endoscopy;  Laterality: N/A;  1200-rescheduled to 9/4 @ 10:45 Ann notified pt  COLONOSCOPY WITH PROPOFOL  N/A 11/11/2021   Procedure: COLONOSCOPY WITH PROPOFOL ;  Surgeon: Golda Claudis PENNER, MD;  [facility];  Service: Endoscopy;  Laterality: N/A;  2:05   COLONOSCOPY WITH PROPOFOL  N/A 12/22/2022   Procedure: COLONOSCOPY WITH PROPOFOL ;  Surgeon: Eartha Angelia Sieving, MD;  [facility];  Service: Gastroenterology;  Laterality: N/A;  11:15am, asa 1-2   COLONOSCOPY WITH PROPOFOL  N/A 11/08/2023   Procedure: COLONOSCOPY WITH PROPOFOL ;  Surgeon: Eartha Angelia Sieving, MD;  [facility];  Service: Gastroenterology;  Laterality: N/A;  10:30AM;ASA 3   ESOPHAGOGASTRODUODENOSCOPY  06/16/2012   Procedure: ESOPHAGOGASTRODUODENOSCOPY (EGD);  Surgeon: Claudis PENNER Golda, MD;  [facility];  Service: Endoscopy;  Laterality: N/A;  10:30 AM   hemocolectomy  12/14/2003   right   HEMOSTASIS CLIP PLACEMENT  12/22/2022   Procedure: HEMOSTASIS CLIP PLACEMENT;  Surgeon: Eartha Angelia Sieving, MD;  [facility];  Service: Gastroenterology;;   HEMOSTASIS CLIP  PLACEMENT  11/08/2023   Procedure: HEMOSTASIS CLIP PLACEMENT;  Surgeon: Eartha Angelia Sieving, MD;  [facility];  Service: Gastroenterology;;   Illeocecal Resection  2005   INCISION AND DRAINAGE ABSCESS N/A 11/10/2022   Procedure: INCISION AND DRAINAGE ABSCESS, PERIANAL;  Surgeon: Mavis Anes, MD;  [facility]: [facility];  Service: General;  Laterality: N/A;   INCISIONAL HERNIA REPAIR N/A 08/24/2021   Procedure: HERNIA REPAIR INCISIONAL WITH MESH;  Surgeon: Mavis Anes, MD;  Location: [facility];  Service: General;  Laterality: N/A;   LAPAROSCOPIC SIGMOID COLECTOMY N/A 01/07/2015   Procedure: LOW ANTERIOR COLON RESECTION;  Surgeon: Elon Pacini, MD;  Location: [facility];  Service: General;  Laterality: N/A;   POLYPECTOMY  12/22/2022   Procedure: POLYPECTOMY;  Surgeon: Eartha Angelia Sieving, MD;  [facility];  Service: Gastroenterology;;   POLYPECTOMY  11/08/2023   Procedure: POLYPECTOMY;  Surgeon: Eartha Angelia Sieving, MD;  [facility];  Service: Gastroenterology;;   SHOULDER ARTHROSCOPY WITH LABRAL REPAIR Right 01/20/2016   Procedure: SHOULDER ARTHROSCOPY WITH LABRAL REPAIR;  Surgeon: Glendia Cordella Hutchinson, MD;  Location: [facility] OR;  Service: Orthopedics;  Laterality: Right;   SUBMUCOSAL LIFTING INJECTION  12/22/2022   Procedure: SUBMUCOSAL LIFTING INJECTION;  Surgeon: Eartha Angelia Sieving, MD;  [facility];  Service: Gastroenterology;;   SUBMUCOSAL LIFTING INJECTION  11/08/2023   Procedure: SUBMUCOSAL LIFTING INJECTION;  Surgeon: Eartha Angelia, Sieving, MD;  [facility];  Service: Gastroenterology;;   SUBMUCOSAL TATTOO INJECTION  12/22/2022   Procedure: SUBMUCOSAL TATTOO INJECTION;  Surgeon: Eartha Angelia, Sieving, MD;  [facility];  Service: Gastroenterology;;   TONGUE SURGERY Left 2018   cancer removed    Family History: Family History  Problem Relation Age of Onset   Healthy Mother    Lung cancer Father         smoked   Allergic Disorder Brother    Healthy Daughter     Social History: Social History   Tobacco Use  Smoking Status Every Day   Current packs/day: 0.25   Average packs/day: 0.3 packs/day for 40.0 years (10.0 ttl pk-yrs)   Types: Cigarettes   Passive exposure: Current  Smokeless Tobacco Never  Tobacco Comments   increase smoking , very nervous   Social History   Substance and Sexual Activity  Alcohol Use Not Currently   Alcohol/week: 0.0 standard drinks of alcohol   Comment: occ   Social History   Substance and Sexual Activity  Drug Use No    Allergies:  Allergies  Allergen Reactions   Contrast Media [Iodinated Contrast Media] Hives and Rash    Medications: Current Outpatient Medications  Medication Sig Dispense Refill   albuterol  (VENTOLIN  HFA) 108 (90 Base) MCG/ACT inhaler Inhale 2 puffs into the lungs every 6 (six) hours as needed for wheezing or shortness of breath.     Ascorbic Acid (VITAMIN C) 1000 MG tablet Take 1,000 mg by mouth every evening.     Cholecalciferol (VITAMIN D3) 50 MCG (2000 UT) TABS Take 2,000 Units by mouth every evening.     HYDROcodone -acetaminophen  (NORCO/VICODIN) 5-325 MG tablet Take 1-2 tablets by mouth every 6 (six) hours as needed for moderate pain (pain score 4-6). 30 tablet 0   omeprazole  (PRILOSEC) 40 MG capsule Take 40 mg by mouth daily before breakfast.     potassium chloride  (MICRO-K ) 10 MEQ CR capsule Take 10 mEq by mouth 2 (two) times daily.      promethazine  (PHENERGAN ) 25 MG tablet TAKE 1 TABLET BY MOUTH TWICE A DAY AS NEEDED 20 tablet 0   Risankizumab -rzaa (SKYRIZI  Gibson) Inject into the skin. 360 mg/2.4 ml Once every 8 weeks.     rivaroxaban  (XARELTO ) 20 MG TABS tablet Take 1 tablet (20 mg total) by mouth daily with supper. 30 tablet    valsartan -hydrochlorothiazide  (DIOVAN -HCT) 160-12.5 MG per tablet Take 1 tablet by mouth in the morning.     zinc gluconate 50 MG tablet Take 50 mg by mouth daily.     hyoscyamine   (LEVBID ) 0.375 MG 12 hr tablet Take 1 tablet (0.375 mg total) by mouth every 12 (twelve) hours as needed (abdominal pain). (Patient not taking: Reported on 10/01/2024) 60 tablet 2   No current facility-administered medications for this visit.    Review of Systems: GENERAL: negative for malaise, night sweats HEENT: No changes in hearing or vision, no nose bleeds or other nasal problems. NECK: Negative for lumps, goiter, pain and significant neck swelling RESPIRATORY: Negative for cough, wheezing CARDIOVASCULAR: Negative for chest pain, leg swelling, palpitations, orthopnea GI: SEE HPI MUSCULOSKELETAL: Negative for joint pain or swelling, back pain, and muscle pain. SKIN: Negative for lesions, rash PSYCH: Negative for sleep disturbance, mood disorder and recent psychosocial stressors. HEMATOLOGY Negative for prolonged bleeding, bruising easily, and swollen nodes. ENDOCRINE: Negative for cold or heat intolerance, polyuria, polydipsia and goiter. NEURO: negative for tremor, gait imbalance, syncope and seizures. The remainder of the review of systems is noncontributory.   Physical Exam: BP (!) 106/55 (BP Location: Left Arm, Patient Position: Sitting, Cuff Size: Large)   Pulse 62   Temp 97.8 F (36.6 C) (Temporal)   Ht 5' 7 (1.702 m)   Wt 203 lb (92.1 kg)   BMI 31.79 kg/m  GENERAL: The patient is AO x3, in no acute distress. HEENT: Head is normocephalic and atraumatic. EOMI are intact. Mouth is well hydrated and without lesions. NECK: Supple. No masses LUNGS: Clear to auscultation. No presence of rhonchi/wheezing/rales. Adequate chest expansion HEART: RRR, normal s1 and s2. ABDOMEN: Soft, nontender, no guarding, no peritoneal signs, and nondistended. BS +. No masses. EXTREMITIES: Without any cyanosis, clubbing, rash, lesions or edema. NEUROLOGIC: AOx3, no focal motor deficit. SKIN: no jaundice, no rashes  Imaging/Labs: as above  I personally reviewed and interpreted the  available labs, imaging and endoscopic files.  Impression and Plan: Londa G Mabile is a 63 y.o. female with past medical history of ileocolonic Crohn's disease status post ileocecectomy and partial colectomy, DVT on Xarelto , recurrent C. Diff, hypertension, GERD, obesity  who  presents for follow up of Crohn's disease.  Patient has had a complicated history of Crohn disease requiring surgical interventions.  Also has had perianal disease that has required antibiotic courses.  Patient is at very high risk for disease recurrence.  I emphasized this and explained importance of complete smoking cessation as this will also increase her risk of treatment failure further.  She understood this.  Will continue Skyrizi  at the same frequency as before and we will obtain surveillance labs in early November.  She appears to be in clinical remission and had significant improvement in endoscopic findings.  In terms of her preventative measures, I advised the patient to obtain flu shot and to schedule appointment with dermatology soon.  Will schedule her for DEXA screening patient high risk as she is a female although her 58(and has had chronic prednisone  use).  -Continue Skyrizi  every 8 weeks -please fill paperwork to continue getting this medication -Check CBC, CMP, CRP, vitamin D , TB/hepatitis B screening -Obtain flu shot -Make appointment with dermatologist for yearly annual cancer screening -Schedule DEXA scan -Smoking cessation  All questions were answered.      Toribio Fortune, MD Gastroenterology and Hepatology [facility]

## 2024-10-01 NOTE — Patient Instructions (Addendum)
 Continue Skyrizi  every 8 weeks -please fill paperwork to continue getting this medication Perform blood workup first week of November Obtain flu shot Make appointment with dermatologist for yearly annual cancer screening Schedule DEXA scan Smoking cessation

## 2024-10-02 ENCOUNTER — Telehealth (INDEPENDENT_AMBULATORY_CARE_PROVIDER_SITE_OTHER): Payer: Self-pay

## 2024-10-02 NOTE — Telephone Encounter (Signed)
 Spoke with patient, scheduled Dexa scan for 10/08/2024 at 11am. Patient aware of appt and verbalized understanding.

## 2024-10-02 NOTE — Telephone Encounter (Signed)
 10/01/2024: gave patient the PAP forms to fill out and asked that she call the Patient assistance foundation to see if she is required to apply for LIS. Patient states understanding and will keep me updated.

## 2024-10-08 ENCOUNTER — Other Ambulatory Visit (HOSPITAL_COMMUNITY)

## 2024-10-15 ENCOUNTER — Encounter: Payer: Self-pay | Admitting: Radiology

## 2024-10-18 ENCOUNTER — Other Ambulatory Visit (INDEPENDENT_AMBULATORY_CARE_PROVIDER_SITE_OTHER): Payer: Self-pay

## 2024-10-18 ENCOUNTER — Ambulatory Visit: Admitting: Sports Medicine

## 2024-10-18 ENCOUNTER — Encounter: Payer: Self-pay | Admitting: Sports Medicine

## 2024-10-18 DIAGNOSIS — M79671 Pain in right foot: Secondary | ICD-10-CM

## 2024-10-18 DIAGNOSIS — S76011D Strain of muscle, fascia and tendon of right hip, subsequent encounter: Secondary | ICD-10-CM | POA: Diagnosis not present

## 2024-10-18 DIAGNOSIS — M25552 Pain in left hip: Secondary | ICD-10-CM

## 2024-10-18 DIAGNOSIS — G8929 Other chronic pain: Secondary | ICD-10-CM

## 2024-10-18 DIAGNOSIS — S76012D Strain of muscle, fascia and tendon of left hip, subsequent encounter: Secondary | ICD-10-CM | POA: Diagnosis not present

## 2024-10-18 DIAGNOSIS — M25551 Pain in right hip: Secondary | ICD-10-CM

## 2024-10-18 NOTE — Progress Notes (Signed)
 Patient says that her hip pain has flared up again as she has been doing a lot of walking up and down a hill while taking care of her husband. She says that her pain feels the same as it did prior to the shockwave therapy. She did get great relief from the shockwave therapy earlier this year.   Patient says that she tripped over her dog 3 weeks ago and has had right foot pain in the time since then. She says that her pain is in the bottom of the heel. Her pain is worse when she sits for awhile and stands on it, and she is unable to walk without shoes. She denies any pain that goes into the arch of her foot or up the achilles. She says that it does burn when at rest.

## 2024-10-18 NOTE — Progress Notes (Signed)
 Elizabeth [name] - 63 y.o. female MRN [id number]  Date of birth: [birth year]  Office Visit Note: Visit Date: 10/18/2024 PCP: Toribio Jerel KANDICE, MD Referred by: Toribio Jerel KANDICE, MD  Subjective: Chief Complaint  Patient presents with   Right Hip - Pain   Left Hip - Pain   Right Heel - Pain   HPI: Elizabeth [name] is a pleasant 63 y.o. female who presents today for chronic bilateral hip pain, also with right heel pain x 3 weeks.  Bilateral hips -she has had a reoccurrence of her bilateral hip pain as she has been doing a lot of walking to care for her husband.  Back earlier in the year we did perform 2 treatments of extracorporeal shockwave therapy which gave her significant relief.  She has had trochanteric bursa injections in the past which were less helpful.  She does use Vicodin 5-325 mg as needed for her chronic pain.  She cannot take NSAIDs given her Crohn's.  Right heel - about 3 weeks ago she tripped over her dog and landed hard on her right heel.  She has had pain since that time.  Is localized to the bottom of the heel.  This is worse after sitting for a while and then going to stand.  It is painful with barefoot walking.  She does feel she needs to walk on the toes to avoid impact on the heel.  Pertinent ROS were reviewed with the patient and found to be negative unless otherwise specified above in HPI.   Assessment & Plan: Visit Diagnoses:  1. Pain of right heel   2. Chronic hip pain, bilateral   3. Tear of right gluteus medius tendon, subsequent encounter   4. Tear of left gluteus medius tendon, subsequent encounter    Plan: Impression is right heel pain after hard impact tripping over her dog.  X-rays show no acute fracture.  I do think this is more fitting for a calcaneal bone bruise versus irritation of the underlying plantar fascia from the impact.  She also has significant pes cavus which offloads pressure into the plantar fascia.  We did fit her for calcaneal gel insoles today to  help cushion and offload this area.  She will wear these in her shoes, recommended avoiding barefoot walking.  I do think this will continue to get better with time, if continues over the next 4-6 weeks, could pursue advanced imaging such as MRI.  In terms of her bilateral hips, these have been exacerbated given the amount of walking and care for her husband.  She does have MRI confirmed prior trochanteric bursitis with interstitial tearing of both the left and right gluteal tendons.  She responded excellently to 2 treatments of extracorporeal shockwave therapy earlier in the year.  We did repeat this today for both hips.  I would like to see her back for 1-2 additional treatments and reevaluation and see what sort of further cumulative benefit she is receives.  She may continue her Vicodin 5-325 mg few times daily as needed.    Meds & Orders: No orders of the defined types were placed in this encounter.   Orders Placed This Encounter  Procedures   XR Foot Complete Right     Procedures: No procedures performed      Clinical History: No specialty comments available.  She reports that she has been smoking cigarettes. She has a 10 pack-year smoking history. She has been exposed to tobacco smoke. She has never used  smokeless tobacco. No results for input(s): HGBA1C, LABURIC in the last 8760 hours.  Objective:    Physical Exam  Gen: Well-appearing, in no acute distress; non-toxic CV: Well-perfused. Warm.  Resp: Breathing unlabored on room air; no wheezing. Psych: Fluid speech in conversation; appropriate affect; normal thought process  Ortho Exam - Bilateral hips: Positive TTP over bilateral greater trochanteric regions, there is maybe a mild degree of soft tissue swelling or bursitis here.  There is no restriction with logroll.  There is weakness and associated pain with bilateral hip abduction testing.  - R-heel: No redness swelling or effusion.  There is tenderness over the plantar  aspect of the heel both medially and laterally.  Negative calcaneal heel squeeze over the superior aspect of the calcaneus.  Imaging:  XR Foot Complete Right 3 views of the right foot including AP, lateral and oblique film were  ordered and reviewed by myself today.  X-rays demonstrate small plantar  calcaneal spur as well as superior calcaneal enthesophytes.  There is  notable pes cavus noted.  No acute bony fracture noted.   Past Medical/Family/Surgical/Social History: Medications & Allergies reviewed per EMR, new medications updated. Patient Active Problem List   Diagnosis Date Noted   Perianal Crohn's disease, with abscess (HCC) 05/08/2024   Perianal abscess 05/08/2024   History of colonic polyps 11/08/2023   Long-term current use of immunosuppressive biologic agent 01/24/2023   History of colon polyps 12/22/2022   Vitamin D  deficiency 01/21/2022   Ileus (HCC) 12/24/2021   Crohn's colitis (HCC) 12/21/2021   Hypomagnesemia 12/21/2021   LLQ abdominal pain    Abdominal pain 10/06/2021   Incisional hernia, without obstruction or gangrene    Abnormal LFTs 07/14/2021   Abdominal wall pain 03/31/2021   Crohn's disease of both small and large intestine (HCC) 12/02/2020   Diarrhea 09/02/2020   C. difficile diarrhea 08/07/2020   Back spasm 05/20/2020   Rectal pain 11/20/2019   Hemorrhoids 11/20/2019   Crohn's disease (HCC) 04/19/2019   Acute bronchitis 11/29/2017   Primary insomnia 04/08/2017   Strain of back 04/08/2017   Diverticulitis large intestine 01/07/2015   Diverticulitis 07/03/2014   Hypokalemia 07/03/2014   Tobacco abuse    Obesity    Upper airway cough syndrome 01/28/2014   IBS (irritable bowel syndrome) 07/10/2013   Acute bilateral lower abdominal pain 03/26/2013   Tiredness 03/26/2013   Sigmoid diverticulitis 09/12/2012   Epigastric pain 05/22/2012   GERD (gastroesophageal reflux disease) 01/10/2012   HTN (hypertension) 01/10/2012   Past Medical History:   Diagnosis Date   Arthritis    Bronchitis    C. difficile diarrhea    Cancer (HCC)    Cough 01/28/2014   upper airway cough syndrome   Crohn's disease (HCC)    Diverticulitis    DVT 06/2020   history of   Elevated transaminase level    GERD (gastroesophageal reflux disease)    Hypertension    Obesity    Pneumonia 12/2013   Tobacco abuse    Family History  Problem Relation Age of Onset   Healthy Mother    Lung cancer Father        smoked   Allergic Disorder Brother    Healthy Daughter    Past Surgical History:  Procedure Laterality Date   ABDOMINAL HYSTERECTOMY  12/13/1978   APPENDECTOMY     BALLOON DILATION  11/08/2023   Procedure: BALLOON DILATION;  Surgeon: Eartha Angelia Sieving, MD;  [facility];  Service: Gastroenterology;;  BIOPSY  11/11/2021   Procedure: BIOPSY;  Surgeon: Golda Claudis PENNER, MD;  [facility];  Service: Endoscopy;;  bx of polyp and small bowel   BIOPSY  11/08/2023   Procedure: BIOPSY;  Surgeon: Eartha Angelia Sieving, MD;  [facility];  Service: Gastroenterology;;   COLON SURGERY     COLONOSCOPY     COLONOSCOPY N/A 08/16/2014   Procedure: COLONOSCOPY;  Surgeon: Claudis PENNER Golda, MD;  [facility];  Service: Endoscopy;  Laterality: N/A;  1200-rescheduled to 9/4 @ 10:45 Ann notified pt   COLONOSCOPY WITH PROPOFOL  N/A 11/11/2021   Procedure: COLONOSCOPY WITH PROPOFOL ;  Surgeon: Golda Claudis PENNER, MD;  [facility];  Service: Endoscopy;  Laterality: N/A;  2:05   COLONOSCOPY WITH PROPOFOL  N/A 12/22/2022   Procedure: COLONOSCOPY WITH PROPOFOL ;  Surgeon: Eartha Angelia Sieving, MD;  [facility];  Service: Gastroenterology;  Laterality: N/A;  11:15am, asa 1-2   COLONOSCOPY WITH PROPOFOL  N/A 11/08/2023   Procedure: COLONOSCOPY WITH PROPOFOL ;  Surgeon: Eartha Angelia Sieving, MD;  [facility];  Service: Gastroenterology;  Laterality: N/A;  10:30AM;ASA 3    ESOPHAGOGASTRODUODENOSCOPY  06/16/2012   Procedure: ESOPHAGOGASTRODUODENOSCOPY (EGD);  Surgeon: Claudis PENNER Golda, MD;  [facility];  Service: Endoscopy;  Laterality: N/A;  10:30 AM   hemocolectomy  12/14/2003   right   HEMOSTASIS CLIP PLACEMENT  12/22/2022   Procedure: HEMOSTASIS CLIP PLACEMENT;  Surgeon: Eartha Angelia Sieving, MD;  [facility];  Service: Gastroenterology;;   HEMOSTASIS CLIP PLACEMENT  11/08/2023   Procedure: HEMOSTASIS CLIP PLACEMENT;  Surgeon: Eartha Angelia Sieving, MD;  [facility];  Service: Gastroenterology;;   Illeocecal Resection  2005   INCISION AND DRAINAGE ABSCESS N/A 11/10/2022   Procedure: INCISION AND DRAINAGE ABSCESS, PERIANAL;  Surgeon: Mavis Anes, MD;  [facility]: [facility];  Service: General;  Laterality: N/A;   INCISIONAL HERNIA REPAIR N/A 08/24/2021   Procedure: HERNIA REPAIR INCISIONAL WITH MESH;  Surgeon: Mavis Anes, MD;  Location: [facility];  Service: General;  Laterality: N/A;   LAPAROSCOPIC SIGMOID COLECTOMY N/A 01/07/2015   Procedure: LOW ANTERIOR COLON RESECTION;  Surgeon: Elon Pacini, MD;  Location: [facility];  Service: General;  Laterality: N/A;   POLYPECTOMY  12/22/2022   Procedure: POLYPECTOMY;  Surgeon: Eartha Angelia Sieving, MD;  [facility];  Service: Gastroenterology;;   POLYPECTOMY  11/08/2023   Procedure: POLYPECTOMY;  Surgeon: Eartha Angelia Sieving, MD;  [facility];  Service: Gastroenterology;;   SHOULDER ARTHROSCOPY WITH LABRAL REPAIR Right 01/20/2016   Procedure: SHOULDER ARTHROSCOPY WITH LABRAL REPAIR;  Surgeon: Glendia Cordella Hutchinson, MD;  Location: [facility];  Service: Orthopedics;  Laterality: Right;   SUBMUCOSAL LIFTING INJECTION  12/22/2022   Procedure: SUBMUCOSAL LIFTING INJECTION;  Surgeon: Eartha Angelia Sieving, MD;  [facility];  Service: Gastroenterology;;   SUBMUCOSAL LIFTING INJECTION  11/08/2023   Procedure: SUBMUCOSAL LIFTING INJECTION;  Surgeon:  Eartha Angelia Sieving, MD;  [facility];  Service: Gastroenterology;;   SUBMUCOSAL TATTOO INJECTION  12/22/2022   Procedure: SUBMUCOSAL TATTOO INJECTION;  Surgeon: Eartha Angelia Sieving, MD;  [facility];  Service: Gastroenterology;;   TONGUE SURGERY Left 2018   cancer removed   Social History   Occupational History   Occupation: Wound Treatment Nurse    Employer: [employer]  Tobacco Use   Smoking status: Every Day    Current packs/day: 0.25    Average packs/day: 0.3 packs/day for 40.0  years (10.0 ttl pk-yrs)    Types: Cigarettes    Passive exposure: Current   Smokeless tobacco: Never   Tobacco comments:    increase smoking , very nervous  Vaping Use   Vaping status: Never Used  Substance and Sexual Activity   Alcohol use: Not Currently    Alcohol/week: 0.0 standard drinks of alcohol    Comment: occ   Drug use: No   Sexual activity: Yes

## 2024-10-20 ENCOUNTER — Encounter: Payer: Self-pay | Admitting: Sports Medicine

## 2024-10-22 DIAGNOSIS — L821 Other seborrheic keratosis: Secondary | ICD-10-CM | POA: Diagnosis not present

## 2024-10-22 DIAGNOSIS — L578 Other skin changes due to chronic exposure to nonionizing radiation: Secondary | ICD-10-CM | POA: Diagnosis not present

## 2024-10-24 NOTE — Telephone Encounter (Signed)
 Patient returned call to Tanya's phone, and when I called her back, no answer. I did ask her to call me back again as the message she left states she was waiting on a response from the social security office for three weeks. Patient also asked for labs to be submitted to [facility]. (Need to find out what labs she is inquiring of).

## 2024-10-24 NOTE — Telephone Encounter (Signed)
 I called and left a voice message asked that the patient return call to the office or respond through My Chart regarding Patient assistance for Skyrizi  for 2026.

## 2024-10-25 ENCOUNTER — Other Ambulatory Visit (INDEPENDENT_AMBULATORY_CARE_PROVIDER_SITE_OTHER): Payer: Self-pay

## 2024-10-25 DIAGNOSIS — Z1159 Encounter for screening for other viral diseases: Secondary | ICD-10-CM

## 2024-10-25 DIAGNOSIS — E876 Hypokalemia: Secondary | ICD-10-CM

## 2024-10-25 DIAGNOSIS — K50018 Crohn's disease of small intestine with other complication: Secondary | ICD-10-CM

## 2024-10-25 DIAGNOSIS — Z1321 Encounter for screening for nutritional disorder: Secondary | ICD-10-CM

## 2024-10-25 DIAGNOSIS — Z111 Encounter for screening for respiratory tuberculosis: Secondary | ICD-10-CM

## 2024-10-26 ENCOUNTER — Encounter: Payer: Self-pay | Admitting: Sports Medicine

## 2024-10-26 ENCOUNTER — Ambulatory Visit: Admitting: Sports Medicine

## 2024-10-26 DIAGNOSIS — M7061 Trochanteric bursitis, right hip: Secondary | ICD-10-CM

## 2024-10-26 DIAGNOSIS — S76011D Strain of muscle, fascia and tendon of right hip, subsequent encounter: Secondary | ICD-10-CM

## 2024-10-26 DIAGNOSIS — S9031XD Contusion of right foot, subsequent encounter: Secondary | ICD-10-CM | POA: Diagnosis not present

## 2024-10-26 DIAGNOSIS — S76012D Strain of muscle, fascia and tendon of left hip, subsequent encounter: Secondary | ICD-10-CM

## 2024-10-26 DIAGNOSIS — M7062 Trochanteric bursitis, left hip: Secondary | ICD-10-CM

## 2024-10-26 DIAGNOSIS — M79671 Pain in right foot: Secondary | ICD-10-CM | POA: Diagnosis not present

## 2024-10-26 NOTE — Progress Notes (Signed)
 Elizabeth [name] - 63 y.o. female MRN [id number]  Date of birth: [birth year]  Office Visit Note: Visit Date: 10/26/2024 PCP: Toribio Jerel KANDICE, MD Referred by: Toribio Jerel KANDICE, MD  Subjective: Chief Complaint  Patient presents with   Right Hip - Follow-up   Left Hip - Follow-up   Right Heel - Follow-up   HPI: Elizabeth [name] is a pleasant 63 y.o. female who presents today for follow-up of bilateral lateral hip pain, right heel pain.  Bilateral hips -she is feeling significantly better.  Responded excellently to our first trial of extracorporeal shockwave therapy.  Previously she was having difficulty lifting her legs and this is no longer painful.  Still some mild tenderness over the lateral side.  Right heel -this feels better as well but she is having difficulty walking barefoot.  The heel silicone cups do help provide cushioning and she has little pain in the shoes.  She is using her Norco 5-325 mg as needed for pain but has been able to wean down on this given her pain is improving.  Pertinent ROS were reviewed with the patient and found to be negative unless otherwise specified above in HPI.   Assessment & Plan: Visit Diagnoses:  1. Trochanteric bursitis of both hips   2. Tear of right gluteus medius tendon, subsequent encounter   3. Tear of left gluteus medius tendon, subsequent encounter   4. Pain of right heel   5. Contusion of right heel, subsequent encounter    Plan: Impression is acute on chronic bilateral lateral hip pain with both trochanteric bursitis as well as MRI confirmed gluteus medius tearing/tendinopathy.  She responded excellently to our first trial of extracorporeal shockwave therapy, we did repeat an additional treatment today.  Advised on postprocedural protocol.  Did discuss the importance of remaining active with her hip abduction exercises for longevity.  Given the degree of relief, I think we can see how she feels over the next few weeks and she will reach out if  she needs me.  In terms of her heel pain, she is improving and symptoms are on the timeline for improvement given heel contusion.  She will continue in her calcaneal gel insoles for support and cushioning.  Avoid barefoot walking at this time.  If she is still having symptoms, December 1, she will let me know and we can consider additional treatment versus MRI to evaluate for underlying bony/plantar fascia abnormality.  For her pain, she may continue her Norco 5-325 mg simply as needed.  Follow-up as needed.  Follow-up: Return if symptoms worsen or fail to improve.   Meds & Orders: No orders of the defined types were placed in this encounter.  No orders of the defined types were placed in this encounter.    Procedures: Procedure: ECSWT Indications:  Bilateral trochanteric bursitis/Gluteal tendinopathy   Procedure Details Consent: Risks of procedure as well as the alternatives and risks of each were explained to the patient.  Verbal consent for procedure obtained. Time Out: Verified patient identification, verified procedure, site was marked, verified correct patient position. The area was cleaned with alcohol swab.     The right and left greater trochanteric region and gluteal tendons was targeted for Extracorporeal shockwave therapy.     Preset: Trochanteric Bursitis Power Level: 110 mJ Frequency: 12-13 Hz   Impulse/cycles: 2400 (Right) Head size: Regular   Preset: Trochanteric Bursitis Power Level: 110 mJ Frequency: 12-13 Hz Impulse/cycles: 2400 (Left) Head size: Regular   Patient tolerated  procedure well without immediate complications.        Clinical History: No specialty comments available.  She reports that she has been smoking cigarettes. She has a 10 pack-year smoking history. She has been exposed to tobacco smoke. She has never used smokeless tobacco. No results for input(s): HGBA1C, LABURIC in the last 8760 hours.  Objective:    Physical Exam  Gen:  Well-appearing, in no acute distress; non-toxic CV: Well-perfused. Warm.  Resp: Breathing unlabored on room air; no wheezing. Psych: Fluid speech in conversation; appropriate affect; normal thought process  Ortho Exam - Bilateral hips: There is mild TTP over bilateral greater trochanteric region although significantly improved from last visit.  Trace bursitis but no significant swelling.  There is improved hip abduction strength testing although still not full.  Imaging: No results found.  Past Medical/Family/Surgical/Social History: Medications & Allergies reviewed per EMR, new medications updated. Patient Active Problem List   Diagnosis Date Noted   Perianal Crohn's disease, with abscess (HCC) 05/08/2024   Perianal abscess 05/08/2024   History of colonic polyps 11/08/2023   Long-term current use of immunosuppressive biologic agent 01/24/2023   History of colon polyps 12/22/2022   Vitamin D  deficiency 01/21/2022   Ileus (HCC) 12/24/2021   Crohn's colitis (HCC) 12/21/2021   Hypomagnesemia 12/21/2021   LLQ abdominal pain    Abdominal pain 10/06/2021   Incisional hernia, without obstruction or gangrene    Abnormal LFTs 07/14/2021   Abdominal wall pain 03/31/2021   Crohn's disease of both small and large intestine (HCC) 12/02/2020   Diarrhea 09/02/2020   C. difficile diarrhea 08/07/2020   Back spasm 05/20/2020   Rectal pain 11/20/2019   Hemorrhoids 11/20/2019   Crohn's disease (HCC) 04/19/2019   Acute bronchitis 11/29/2017   Primary insomnia 04/08/2017   Strain of back 04/08/2017   Diverticulitis large intestine 01/07/2015   Diverticulitis 07/03/2014   Hypokalemia 07/03/2014   Tobacco abuse    Obesity    Upper airway cough syndrome 01/28/2014   IBS (irritable bowel syndrome) 07/10/2013   Acute bilateral lower abdominal pain 03/26/2013   Tiredness 03/26/2013   Sigmoid diverticulitis 09/12/2012   Epigastric pain 05/22/2012   GERD (gastroesophageal reflux disease)  01/10/2012   HTN (hypertension) 01/10/2012   Past Medical History:  Diagnosis Date   Arthritis    Bronchitis    C. difficile diarrhea    Cancer (HCC)    Cough 01/28/2014   upper airway cough syndrome   Crohn's disease (HCC)    Diverticulitis    DVT 06/2020   history of   Elevated transaminase level    GERD (gastroesophageal reflux disease)    Hypertension    Obesity    Pneumonia 12/2013   Tobacco abuse    Family History  Problem Relation Age of Onset   Healthy Mother    Lung cancer Father        smoked   Allergic Disorder Brother    Healthy Daughter    Past Surgical History:  Procedure Laterality Date   ABDOMINAL HYSTERECTOMY  12/13/1978   APPENDECTOMY     BALLOON DILATION  11/08/2023   Procedure: BALLOON DILATION;  Surgeon: Eartha Angelia Sieving, MD;  [facility];  Service: Gastroenterology;;   BIOPSY  11/11/2021   Procedure: BIOPSY;  Surgeon: Golda Claudis PENNER, MD;  [facility];  Service: Endoscopy;;  bx of polyp and small bowel   BIOPSY  11/08/2023   Procedure: BIOPSY;  Surgeon: Eartha Angelia Sieving, MD;  [facility];  Service: Gastroenterology;;   COLON SURGERY     COLONOSCOPY     COLONOSCOPY N/A 08/16/2014   Procedure: COLONOSCOPY;  Surgeon: Claudis RAYMOND Rivet, MD;  [facility];  Service: Endoscopy;  Laterality: N/A;  1200-rescheduled to 9/4 @ 10:45 Ann notified pt   COLONOSCOPY WITH PROPOFOL  N/A 11/11/2021   Procedure: COLONOSCOPY WITH PROPOFOL ;  Surgeon: Rivet Claudis RAYMOND, MD;  [facility];  Service: Endoscopy;  Laterality: N/A;  2:05   COLONOSCOPY WITH PROPOFOL  N/A 12/22/2022   Procedure: COLONOSCOPY WITH PROPOFOL ;  Surgeon: Eartha Angelia Sieving, MD;  [facility];  Service: Gastroenterology;  Laterality: N/A;  11:15am, asa 1-2   COLONOSCOPY WITH PROPOFOL  N/A 11/08/2023   Procedure: COLONOSCOPY WITH PROPOFOL ;  Surgeon: Eartha Angelia Sieving, MD;  [facility];   Service: Gastroenterology;  Laterality: N/A;  10:30AM;ASA 3   ESOPHAGOGASTRODUODENOSCOPY  06/16/2012   Procedure: ESOPHAGOGASTRODUODENOSCOPY (EGD);  Surgeon: Claudis RAYMOND Rivet, MD;  [facility];  Service: Endoscopy;  Laterality: N/A;  10:30 AM   hemocolectomy  12/14/2003   right   HEMOSTASIS CLIP PLACEMENT  12/22/2022   Procedure: HEMOSTASIS CLIP PLACEMENT;  Surgeon: Eartha Angelia Sieving, MD;  [facility];  Service: Gastroenterology;;   HEMOSTASIS CLIP PLACEMENT  11/08/2023   Procedure: HEMOSTASIS CLIP PLACEMENT;  Surgeon: Eartha Angelia Sieving, MD;  [facility];  Service: Gastroenterology;;   Illeocecal Resection  2005   INCISION AND DRAINAGE ABSCESS N/A 11/10/2022   Procedure: INCISION AND DRAINAGE ABSCESS, PERIANAL;  Surgeon: Mavis Anes, MD;  [facility];  Service: General;  Laterality: N/A;   INCISIONAL HERNIA REPAIR N/A 08/24/2021   Procedure: HERNIA REPAIR INCISIONAL WITH MESH;  Surgeon: Mavis Anes, MD;  [facility];  Service: General;  Laterality: N/A;   LAPAROSCOPIC SIGMOID COLECTOMY N/A 01/07/2015   Procedure: LOW ANTERIOR COLON RESECTION;  Surgeon: Elon Pacini, MD;  Location: [facility];  Service: General;  Laterality: N/A;   POLYPECTOMY  12/22/2022   Procedure: POLYPECTOMY;  Surgeon: Eartha Angelia Sieving, MD;  [facility];  Service: Gastroenterology;;   POLYPECTOMY  11/08/2023   Procedure: POLYPECTOMY;  Surgeon: Eartha Angelia Sieving, MD;  [facility];  Service: Gastroenterology;;   SHOULDER ARTHROSCOPY WITH LABRAL REPAIR Right 01/20/2016   Procedure: SHOULDER ARTHROSCOPY WITH LABRAL REPAIR;  Surgeon: Glendia Cordella Hutchinson, MD;  Location: [facility] OR;  Service: Orthopedics;  Laterality: Right;   SUBMUCOSAL LIFTING INJECTION  12/22/2022   Procedure: SUBMUCOSAL LIFTING INJECTION;  Surgeon: Eartha Angelia Sieving, MD;  [facility];  Service: Gastroenterology;;   SUBMUCOSAL LIFTING INJECTION   11/08/2023   Procedure: SUBMUCOSAL LIFTING INJECTION;  Surgeon: Eartha Angelia Sieving, MD;  [facility];  Service: Gastroenterology;;   SUBMUCOSAL TATTOO INJECTION  12/22/2022   Procedure: SUBMUCOSAL TATTOO INJECTION;  Surgeon: Eartha Angelia Sieving, MD;  [facility];  Service: Gastroenterology;;   TONGUE SURGERY Left 2018   cancer removed   Social History   Occupational History   Occupation: Wound Treatment Nurse    Employer: [employer]  Tobacco Use   Smoking status: Every Day    Current packs/day: 0.25    Average packs/day: 0.3 packs/day for 40.0 years (10.0 ttl pk-yrs)    Types: Cigarettes    Passive exposure: Current   Smokeless tobacco: Never   Tobacco comments:    increase smoking , very nervous  Vaping Use   Vaping status: Never Used  Substance and Sexual Activity  Alcohol use: Not Currently    Alcohol/week: 0.0 standard drinks of alcohol    Comment: occ   Drug use: No   Sexual activity: Yes

## 2024-10-26 NOTE — Progress Notes (Signed)
 Patient says that her hips feel about 80% improved since the first shockwave treatment. She does have relief when walking with the heel cup in her shoe, although she is still unable to walk on the heel without pain when barefoot. She is here for repeat shockwave treatment today.

## 2024-10-30 ENCOUNTER — Other Ambulatory Visit (HOSPITAL_COMMUNITY): Payer: Self-pay

## 2024-11-02 ENCOUNTER — Ambulatory Visit: Admitting: Sports Medicine

## 2024-11-05 DIAGNOSIS — Z1159 Encounter for screening for other viral diseases: Secondary | ICD-10-CM | POA: Diagnosis not present

## 2024-11-05 DIAGNOSIS — Z111 Encounter for screening for respiratory tuberculosis: Secondary | ICD-10-CM | POA: Diagnosis not present

## 2024-11-05 DIAGNOSIS — K50018 Crohn's disease of small intestine with other complication: Secondary | ICD-10-CM | POA: Diagnosis not present

## 2024-11-05 DIAGNOSIS — Z1321 Encounter for screening for nutritional disorder: Secondary | ICD-10-CM | POA: Diagnosis not present

## 2024-11-05 DIAGNOSIS — E876 Hypokalemia: Secondary | ICD-10-CM | POA: Diagnosis not present

## 2024-11-05 NOTE — Telephone Encounter (Signed)
 I have faxed the patient application for Skyrizi  patient assistance for 2026 to the patient assistance foundation along with income and insurance information, and denial for LIS program through the Social Service [employer].

## 2024-11-05 NOTE — Telephone Encounter (Signed)
 This message was sent via FAXCOM, a product from [employer]. http://www.biscom.com/                    -------Fax Transmission Report-------  To:               Recipient at [phone] Subject:          Maisa Goetsch PAP 2026 Result:           The transmission was successful. Explanation:      All Pages Ok Pages Sent:       10 Connect Time:     9 minutes, 2 seconds Transmit Time:    11/05/2024 15:39 Transfer Rate:    14400 Status Code:      0000 Retry Count:      0 Job Id:           [id number] Unique Id:        [id number] Fax Line:         65 Fax Server:       [id number]

## 2024-11-05 NOTE — Telephone Encounter (Signed)
 Patient came by today and brought her patient assistance forms for medication assistance for Skyrizi  for 2026. I have filled in our portion and have placed on the doctors desk for a signature. Once this is signed I will submit to the patient assistance foundation [employer] for assistance for 2026.

## 2024-11-07 ENCOUNTER — Ambulatory Visit (INDEPENDENT_AMBULATORY_CARE_PROVIDER_SITE_OTHER): Payer: Self-pay | Admitting: Gastroenterology

## 2024-11-07 LAB — CBC WITH DIFFERENTIAL/PLATELET
Basophils Absolute: 0.1 x10E3/uL (ref 0.0–0.2)
Basos: 1 %
EOS (ABSOLUTE): 0.1 x10E3/uL (ref 0.0–0.4)
Eos: 1 %
Hematocrit: 39 % (ref 34.0–46.6)
Hemoglobin: 13.1 g/dL (ref 11.1–15.9)
Immature Grans (Abs): 0.1 x10E3/uL (ref 0.0–0.1)
Immature Granulocytes: 1 %
Lymphocytes Absolute: 2.4 x10E3/uL (ref 0.7–3.1)
Lymphs: 22 %
MCH: 31.7 pg (ref 26.6–33.0)
MCHC: 33.6 g/dL (ref 31.5–35.7)
MCV: 94 fL (ref 79–97)
Monocytes Absolute: 0.4 x10E3/uL (ref 0.1–0.9)
Monocytes: 4 %
Neutrophils Absolute: 7.7 x10E3/uL — ABNORMAL HIGH (ref 1.4–7.0)
Neutrophils: 71 %
Platelets: 274 x10E3/uL (ref 150–450)
RBC: 4.13 x10E6/uL (ref 3.77–5.28)
RDW: 11.9 % (ref 11.7–15.4)
WBC: 10.7 x10E3/uL (ref 3.4–10.8)

## 2024-11-07 LAB — COMPREHENSIVE METABOLIC PANEL WITH GFR
ALT: 8 IU/L (ref 0–32)
AST: 10 IU/L (ref 0–40)
Albumin: 3.6 g/dL — ABNORMAL LOW (ref 3.9–4.9)
Alkaline Phosphatase: 82 IU/L (ref 49–135)
BUN/Creatinine Ratio: 13 (ref 12–28)
BUN: 9 mg/dL (ref 8–27)
Bilirubin Total: 0.3 mg/dL (ref 0.0–1.2)
CO2: 22 mmol/L (ref 20–29)
Calcium: 8.2 mg/dL — ABNORMAL LOW (ref 8.7–10.3)
Chloride: 106 mmol/L (ref 96–106)
Creatinine, Ser: 0.7 mg/dL (ref 0.57–1.00)
Globulin, Total: 2 g/dL (ref 1.5–4.5)
Glucose: 80 mg/dL (ref 70–99)
Potassium: 3.6 mmol/L (ref 3.5–5.2)
Sodium: 143 mmol/L (ref 134–144)
Total Protein: 5.6 g/dL — ABNORMAL LOW (ref 6.0–8.5)
eGFR: 96 mL/min/1.73 (ref >59)

## 2024-11-07 LAB — QUANTIFERON-TB GOLD PLUS
QuantiFERON Mitogen Value: >10 [IU]/mL
QuantiFERON Nil Value: 0.04 [IU]/mL
QuantiFERON TB1 Ag Value: 0.05 [IU]/mL
QuantiFERON TB2 Ag Value: 0.05 [IU]/mL
QuantiFERON-TB Gold Plus: NEGATIVE

## 2024-11-07 LAB — C-REACTIVE PROTEIN: CRP: 4 mg/L (ref 0–10)

## 2024-11-07 LAB — VITAMIN D 25 HYDROXY (VIT D DEFICIENCY, FRACTURES): Vit D, 25-Hydroxy: 30.2 ng/mL (ref 30.0–100.0)

## 2024-11-07 LAB — HEPATITIS B SURFACE ANTIGEN: Hepatitis B Surface Ag: NEGATIVE

## 2024-11-14 ENCOUNTER — Other Ambulatory Visit (INDEPENDENT_AMBULATORY_CARE_PROVIDER_SITE_OTHER): Payer: Self-pay | Admitting: Gastroenterology

## 2024-11-14 DIAGNOSIS — K50118 Crohn's disease of large intestine with other complication: Secondary | ICD-10-CM

## 2024-11-14 MED ORDER — PREDNISONE 10 MG PO TABS: ORAL_TABLET | ORAL | 0 refills | Status: AC

## 2024-11-15 ENCOUNTER — Other Ambulatory Visit (HOSPITAL_COMMUNITY)

## 2024-11-16 ENCOUNTER — Ambulatory Visit: Admitting: Sports Medicine

## 2024-11-16 NOTE — Telephone Encounter (Signed)
 11/16/2024: Tried calling [employer], held on line for 20 minutes, Vm prompt states have an additional 30 minute wait. I left my phone number and they are supposed to call it back once an agent becomes available.

## 2024-11-16 NOTE — Telephone Encounter (Signed)
 Received an Approval for patient's free Skyrizi  trough 12/12/2025. I have called and left a Voice message letting the patient know this.

## 2024-12-31 ENCOUNTER — Encounter (INDEPENDENT_AMBULATORY_CARE_PROVIDER_SITE_OTHER): Payer: Self-pay

## 2024-12-31 ENCOUNTER — Telehealth (INDEPENDENT_AMBULATORY_CARE_PROVIDER_SITE_OTHER): Payer: Self-pay | Admitting: Gastroenterology

## 2024-12-31 NOTE — Telephone Encounter (Signed)
 Pt left message stating she has been sick for about 3 weeks and would like to be seen this afternoon. Returned call to patient. Pt states back in December she was sick and was giving Prednisone . About 3 weeks ago began to stay nauseated. Has Zofran  and Phenergan  for nausea. Has lost weight; 217lb down to 194lb. No diarrhea, no pain. Pt also sent my chart message. Appt given for 01/01/25 at 11:45; pt states she is unable to take that due to husband has cancer. Transferred patient up front to get new appt.

## 2025-01-01 ENCOUNTER — Ambulatory Visit (INDEPENDENT_AMBULATORY_CARE_PROVIDER_SITE_OTHER): Admitting: Gastroenterology

## 2025-01-01 ENCOUNTER — Encounter (INDEPENDENT_AMBULATORY_CARE_PROVIDER_SITE_OTHER): Payer: Self-pay | Admitting: Gastroenterology

## 2025-01-01 VITALS — BP 107/59 | HR 83 | Temp 97.1°F | Ht 67.0 in | Wt 198.1 lb

## 2025-01-01 DIAGNOSIS — R5383 Other fatigue: Secondary | ICD-10-CM

## 2025-01-01 DIAGNOSIS — R110 Nausea: Secondary | ICD-10-CM | POA: Diagnosis not present

## 2025-01-01 DIAGNOSIS — R634 Abnormal weight loss: Secondary | ICD-10-CM

## 2025-01-01 NOTE — Progress Notes (Unsigned)
 "  Referring Provider: Toribio Jerel MATSU, MD Primary Care Physician:  Toribio Jerel MATSU, MD Primary GI Physician: Previously Dr. Eartha (Dr. Cinderella)   Chief Complaint  Patient presents with   Weight Loss    Pt arrives due to weight loss and nausea. Began about 3 weeks ago. Pt states she can eat in the morning and at lunch. Vomited once. Pt seen PCP for a 6 month check up and he placed her on Amoxicillin    HPI:   Elizabeth [name] is a 64 y.o. female with past medical history of ileocolonic Crohn's disease status post ileocecectomy and partial colectomy, DVT on Xarelto , recurrent C. Diff, hypertension, GERD, obesity   Patient presenting today for:  Nausea and weight loss  Last seen in October by Dr. Eartha, at that time doing well, having 1-2 BMs per day some nausea with stress, some pressure in perianal area. Started back smoking  Recommended to continue skyrizi , update labs with Hep B and TB, obtain flu shot, dermatology cancer screening, DEXA scan, smoking cessation  Labs in November with CBC grossly unremarkable, CMP with calcium  8.2, albumin  3.6 crp 4 vitamin d  30.2 negative Hep bsAb negative TB  Present: weight down from 226 pounds last january to 198 pounds today  States about 3 weeks ago she began feeling really tired and weak, having nausea with one episode of vomiting. No abdominal pain, change in bowel habits, rectal bleeding or melena. Reports chills and sweating. She states PCP started her on amoxicillin TID which seemed to help initially. She endorses some chills and sweating. Has had some blood pressure med changes recently, BP running somewhat low, 90s/50s at home, especially in the evenings she reports. Can drink some coffee and feels better when BP comes up. Feels appetite is okay but certain foods she is not wanting to eat that she normally does. No cough or sore throat. She denies trying to lose weight. Did have some labs at PCP,she thinks just cholesterol and A1c levels.  Denies any other new meds besides amoxicillin. She has nausea medication that she takes as needed.    Reflux is well controlled on omeprazole  40mg  daily.   Last Colonoscopy:10/2023 Preparation of the colon was fair.                           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 Dilated.                           - Congested mucosa in the neo-terminal ileum.                            Biopsied.                           - Non-patent end-to-side ileo-colonic anastomosis.                           - One 12 mm polyp in the transverse colon, removed                            with a cold snare. Resected and retrieved via EMR.  Clips were placed. Clip manufacturer: [employer].                           - Post-polypectomy scar in the descending colon.                           - A tattoo was seen in the descending colon. The                            tattoo site appeared normal.                           - One 2 mm polyp in the descending colon, removed                            with a cold snare. Resected and retrieved.                           - Diverticulosis in the sigmoid colon and in the                            descending colon.                           - Congested mucosa in the sigmoid colon.                           - Patent side-to-side colo-colonic anastomosis,                            characterized by healthy appearing mucosa.                           - Non-bleeding internal hemorrhoids.    A. SMALL BOWEL, BIOPSY: - Enteric mucosa showing nonspecific architectural inflammatory changes, compatible with anastomosis site - Negative for dysplasia malignancy  B. COLON, TRANSVERSE, POLYPECTOMY: - Tubular adenoma(s) - Negative for high-grade dysplasia or malignancy  C. COLON, DESCENDING, PREVIOUS POLYPECTOMY SCAR SITE, POLYPECTOMY: - Hyperplastic polyp(s) - Negative for dysplasia    Recommendations:  2 years  colonoscopy   Filed Weights   01/01/25 1531  Weight: 198 lb 1.6 oz (89.9 kg)     Past Medical History:  Diagnosis Date   Arthritis    Bronchitis    C. difficile diarrhea    Cancer (HCC)    Cough 01/28/2014   upper airway cough syndrome   Crohn's disease (HCC)    Diverticulitis    DVT 06/2020   history of   Elevated transaminase level    GERD (gastroesophageal reflux disease)    Hypertension    Obesity    Pneumonia 12/2013   Tobacco abuse     Past Surgical History:  Procedure Laterality Date   ABDOMINAL HYSTERECTOMY  12/13/1978   APPENDECTOMY     BALLOON DILATION  11/08/2023   Procedure: BALLOON DILATION;  Surgeon: Elizabeth [name] Angelia Sieving, MD;  [facility]: [facility];  Service: Gastroenterology;;   BIOPSY  11/11/2021   Procedure: BIOPSY;  Surgeon: Golda Claudis PENNER, MD;  [facility]: [facility];  Service: Endoscopy;;  bx of polyp and small bowel   BIOPSY  11/08/2023   Procedure: BIOPSY;  Surgeon: Elizabeth [name] Angelia Sieving, MD;  Location: [facility];  Service: Gastroenterology;;   COLON SURGERY     COLONOSCOPY     COLONOSCOPY N/A 08/16/2014   Procedure: COLONOSCOPY;  Surgeon: Claudis PENNER Golda, MD;  Location: [facility];  Service: Endoscopy;  Laterality: N/A;  1200-rescheduled to 9/4 @ 10:45 Ann notified pt   COLONOSCOPY WITH PROPOFOL  N/A 11/11/2021   Procedure: COLONOSCOPY WITH PROPOFOL ;  Surgeon: Golda Claudis PENNER, MD;  [facility]: [facility];  Service: Endoscopy;  Laterality: N/A;  2:05   COLONOSCOPY WITH PROPOFOL  N/A 12/22/2022   Procedure: COLONOSCOPY WITH PROPOFOL ;  Surgeon: Elizabeth [name] Angelia Sieving, MD;  [facility]: [facility];  Service: Gastroenterology;  Laterality: N/A;  11:15am, asa 1-2   COLONOSCOPY WITH PROPOFOL  N/A 11/08/2023   Procedure: COLONOSCOPY WITH PROPOFOL ;  Surgeon: Elizabeth [name] Angelia Sieving, MD;  [facility]: [facility];  Service: Gastroenterology;  Laterality: N/A;  10:30AM;ASA 3   ESOPHAGOGASTRODUODENOSCOPY  06/16/2012   Procedure:  ESOPHAGOGASTRODUODENOSCOPY (EGD);  Surgeon: Claudis PENNER Golda, MD;  [facility]: [facility];  Service: Endoscopy;  Laterality: N/A;  10:30 AM   hemocolectomy  12/14/2003   right   HEMOSTASIS CLIP PLACEMENT  12/22/2022   Procedure: HEMOSTASIS CLIP PLACEMENT;  Surgeon: Elizabeth [name] Angelia Sieving, MD;  [facility]: [facility];  Service: Gastroenterology;;   HEMOSTASIS CLIP PLACEMENT  11/08/2023   Procedure: HEMOSTASIS CLIP PLACEMENT;  Surgeon: Elizabeth [name] Angelia Sieving, MD;  [facility]: [facility];  Service: Gastroenterology;;   Illeocecal Resection  2005   INCISION AND DRAINAGE ABSCESS N/A 11/10/2022   Procedure: INCISION AND DRAINAGE ABSCESS, PERIANAL;  Surgeon: Mavis Anes, MD;  Location: [facility];  Service: General;  Laterality: N/A;   INCISIONAL HERNIA REPAIR N/A 08/24/2021   Procedure: HERNIA REPAIR INCISIONAL WITH MESH;  Surgeon: Mavis Anes, MD;  Location: [facility];  Service: General;  Laterality: N/A;   LAPAROSCOPIC SIGMOID COLECTOMY N/A 01/07/2015   Procedure: LOW ANTERIOR COLON RESECTION;  Surgeon: Elon Pacini, MD;  Location: [facility];  Service: General;  Laterality: N/A;   POLYPECTOMY  12/22/2022   Procedure: POLYPECTOMY;  Surgeon: Elizabeth [name] Angelia Sieving, MD;  [facility];  Service: Gastroenterology;;   POLYPECTOMY  11/08/2023   Procedure: POLYPECTOMY;  Surgeon: Elizabeth [name] Angelia Sieving, MD;  [facility];  Service: Gastroenterology;;   SHOULDER ARTHROSCOPY WITH LABRAL REPAIR Right 01/20/2016   Procedure: SHOULDER ARTHROSCOPY WITH LABRAL REPAIR;  Surgeon: Glendia Cordella Hutchinson, MD;  Location: [facility] OR;  Service: Orthopedics;  Laterality: Right;   SUBMUCOSAL LIFTING INJECTION  12/22/2022   Procedure: SUBMUCOSAL LIFTING INJECTION;  Surgeon: Elizabeth [name] Angelia Sieving, MD;  [facility];  Service: Gastroenterology;;   SUBMUCOSAL LIFTING INJECTION  11/08/2023   Procedure: SUBMUCOSAL LIFTING INJECTION;  Surgeon: Elizabeth [name] Angelia Sieving, MD;  [facility];  Service: Gastroenterology;;   SUBMUCOSAL TATTOO INJECTION  12/22/2022   Procedure: SUBMUCOSAL TATTOO INJECTION;  Surgeon: Elizabeth [name] Angelia, Sieving, MD;  [facility]: [facility];  Service: Gastroenterology;;   TONGUE SURGERY Left 2018   cancer removed    Current Outpatient Medications  Medication Sig Dispense Refill   albuterol  (VENTOLIN  HFA) 108 (90 Base) MCG/ACT inhaler Inhale 2 puffs into the lungs every 6 (six) hours as needed for wheezing  or shortness of breath.     Ascorbic Acid (VITAMIN C) 1000 MG tablet Take 1,000 mg by mouth every evening.     Cholecalciferol (VITAMIN D3) 50 MCG (2000 UT) TABS Take 2,000 Units by mouth every evening.     HYDROcodone -acetaminophen  (NORCO/VICODIN) 5-325 MG tablet Take 1-2 tablets by mouth every 6 (six) hours as needed for moderate pain (pain score 4-6). 30 tablet 0   hyoscyamine  (LEVBID ) 0.375 MG 12 hr tablet Take 1 tablet (0.375 mg total) by mouth every 12 (twelve) hours as needed (abdominal pain). 60 tablet 2   omeprazole  (PRILOSEC) 40 MG capsule Take 40 mg by mouth daily before breakfast.     potassium chloride  (MICRO-K ) 10 MEQ CR capsule Take 10 mEq by mouth 2 (two) times daily.      promethazine  (PHENERGAN ) 25 MG tablet TAKE 1 TABLET BY MOUTH TWICE A DAY AS NEEDED 20 tablet 0   Risankizumab -rzaa (SKYRIZI  [place]) Inject into the skin. 360 mg/2.4 ml Once every 8 weeks.     rivaroxaban  (XARELTO ) 20 MG TABS tablet Take 1 tablet (20 mg total) by mouth daily with supper. 30 tablet    valsartan -hydrochlorothiazide  (DIOVAN -HCT) 160-12.5 MG per tablet Take 1 tablet by mouth in the morning.     zinc gluconate 50 MG tablet Take 50 mg by mouth daily.     No current facility-administered medications for this visit.    Allergies as of 01/01/2025 - Review Complete 01/01/2025  Allergen Reaction Noted   Contrast media [iodinated contrast media] Hives and Rash 01/10/2012    Social History   Socioeconomic History   Marital status: Married    Spouse  name: Not on file   Number of children: Not on file   Years of education: Not on file   Highest education level: Not on file  Occupational History   Occupation: Wound Treatment Nurse    Employer: [employer]  Tobacco Use   Smoking status: Every Day    Current packs/day: 0.25    Average packs/day: 0.3 packs/day for 40.0 years (10.0 ttl pk-yrs)    Types: Cigarettes    Passive exposure: Current   Smokeless tobacco: Never   Tobacco comments:    increase smoking , very nervous  Vaping Use   Vaping status: Never Used  Substance and Sexual Activity   Alcohol  use: Not Currently    Alcohol /week: 0.0 standard drinks of alcohol     Comment: occ   Drug use: No   Sexual activity: Yes  Other Topics Concern   Not on file  Social History Narrative   Not on file   Social Drivers of Health   Tobacco Use: High Risk (01/01/2025)   Patient History    Smoking Tobacco Use: Every Day    Smokeless Tobacco Use: Never    Passive Exposure: Current  Financial Resource Strain: Not on file  Food Insecurity: Not on file  Transportation Needs: Not on file  Physical Activity: Not on file  Stress: Not on file  Social Connections: Not on file  Depression (PHQ2-9): Not on file  Alcohol  Screen: Not on file  Housing: Not on file  Utilities: Not on file  Health Literacy: Not on file    Review of systems General: negative for malaise, night sweats, fever, chills, weight los Neck: Negative for lumps, goiter, pain and significant neck swelling Resp: Negative for cough, wheezing, dyspnea at rest CV: Negative for chest pain, leg swelling, palpitations, orthopnea GI: denies melena, hematochezia, nausea, vomiting, diarrhea, constipation, dysphagia, odyonophagia, early  satiety or unintentional weight loss.  MSK: Negative for joint pain or swelling, back pain, and muscle pain. Derm: Negative for itching or rash Psych: Denies depression, anxiety, memory loss, confusion. No homicidal or suicidal ideation.   Heme: Negative for prolonged bleeding, bruising easily, and swollen nodes. Endocrine: Negative for cold or heat intolerance, polyuria, polydipsia and goiter. Neuro: negative for tremor, gait imbalance, syncope and seizures. The remainder of the review of systems is noncontributory.  Physical Exam: BP (!) 107/59   Pulse 83   Temp (!) 97.1 F (36.2 C)   Ht 5' 7 (1.702 m)   Wt 198 lb 1.6 oz (89.9 kg)   BMI 31.03 kg/m  General:   Alert and oriented. No distress noted. Pleasant and cooperative.  Head:  Normocephalic and atraumatic. Eyes:  Conjuctiva clear without scleral icterus. Mouth:  Oral mucosa pink and moist. Good dentition. No lesions. Heart: Normal rate and rhythm, s1 and s2 heart sounds present.  Lungs: Clear lung sounds in all lobes. Respirations equal and unlabored. Abdomen:  +BS, soft, non-tender and non-distended. No rebound or guarding. No HSM or masses noted. Derm: No palmar erythema or jaundice Msk:  Symmetrical without gross deformities. Normal posture. Extremities:  Without edema. Neurologic:  Alert and  oriented x4 Psych:  Alert and cooperative. Normal mood and affect.  Invalid input(s): 6 MONTHS   ASSESSMENT: Chesney G Beverlin is a 64 y.o. female presenting today    PLAN:  -CBC, CMP, TSH  -talk to PCP regarding BP -hold BP meds until you speak with PCP -pt to consider EGD if nausea persists  All questions were answered, patient verbalized understanding and is in agreement with plan as outlined above.   Follow Up: 1 month  Ileah Falkenstein L. Jaymz Traywick, MSN, APRN, AGNP-C Adult-Gerontology Nurse Practitioner [facility]  "

## 2025-01-01 NOTE — Patient Instructions (Signed)
 Please hold BP meds given low BP Please talk to PCP regarding low BP  I will check some basic labs If nausea persists, I am recommending an EGD if you cannot do this, at the least we should do a CT of your abdomen  Follow up 1 month

## 2025-01-02 ENCOUNTER — Encounter (INDEPENDENT_AMBULATORY_CARE_PROVIDER_SITE_OTHER): Payer: Self-pay

## 2025-01-02 ENCOUNTER — Ambulatory Visit (INDEPENDENT_AMBULATORY_CARE_PROVIDER_SITE_OTHER): Payer: Self-pay | Admitting: Gastroenterology

## 2025-01-02 DIAGNOSIS — R634 Abnormal weight loss: Secondary | ICD-10-CM | POA: Insufficient documentation

## 2025-01-02 LAB — COMPREHENSIVE METABOLIC PANEL WITH GFR
ALT: 13 IU/L (ref 0–32)
AST: 17 IU/L (ref 0–40)
Albumin: 4 g/dL (ref 3.9–4.9)
Alkaline Phosphatase: 85 IU/L (ref 49–135)
BUN/Creatinine Ratio: 13 (ref 12–28)
BUN: 15 mg/dL (ref 8–27)
Bilirubin Total: 0.4 mg/dL (ref 0.0–1.2)
CO2: 22 mmol/L (ref 20–29)
Calcium: 9 mg/dL (ref 8.7–10.3)
Chloride: 102 mmol/L (ref 96–106)
Creatinine, Ser: 1.13 mg/dL — ABNORMAL HIGH (ref 0.57–1.00)
Globulin, Total: 2.3 g/dL (ref 1.5–4.5)
Glucose: 76 mg/dL (ref 70–99)
Potassium: 4.6 mmol/L (ref 3.5–5.2)
Sodium: 143 mmol/L (ref 134–144)
Total Protein: 6.3 g/dL (ref 6.0–8.5)
eGFR: 54 mL/min/1.73 — ABNORMAL LOW

## 2025-01-02 LAB — CBC
Hematocrit: 45.2 % (ref 34.0–46.6)
Hemoglobin: 14.9 g/dL (ref 11.1–15.9)
MCH: 30.5 pg (ref 26.6–33.0)
MCHC: 33 g/dL (ref 31.5–35.7)
MCV: 93 fL (ref 79–97)
Platelets: 346 x10E3/uL (ref 150–450)
RBC: 4.88 x10E6/uL (ref 3.77–5.28)
RDW: 12.2 % (ref 11.7–15.4)
WBC: 13.3 x10E3/uL — ABNORMAL HIGH (ref 3.4–10.8)

## 2025-01-02 LAB — TSH: TSH: 3.37 u[IU]/mL (ref 0.450–4.500)

## 2025-01-07 ENCOUNTER — Encounter (INDEPENDENT_AMBULATORY_CARE_PROVIDER_SITE_OTHER): Payer: Self-pay

## 2025-01-08 ENCOUNTER — Other Ambulatory Visit (INDEPENDENT_AMBULATORY_CARE_PROVIDER_SITE_OTHER): Payer: Self-pay | Admitting: Gastroenterology

## 2025-01-08 MED ORDER — CIPROFLOXACIN HCL 500 MG PO TABS: 500.0000 mg | ORAL_TABLET | Freq: Two times a day (BID) | ORAL | 0 refills | Status: AC

## 2025-01-08 MED ORDER — METRONIDAZOLE 500 MG PO TABS: 500.0000 mg | ORAL_TABLET | Freq: Two times a day (BID) | ORAL | 0 refills | Status: AC

## 2025-02-18 ENCOUNTER — Ambulatory Visit (INDEPENDENT_AMBULATORY_CARE_PROVIDER_SITE_OTHER): Admitting: Gastroenterology
